# Patient Record
Sex: Male | Born: 1940 | Race: Black or African American | Hispanic: No | Marital: Married | Smoking: Former smoker
Health system: Southern US, Community
[De-identification: ages and names within clinical notes are randomized; demographics above are authoritative.]

## PROBLEM LIST (undated history)

## (undated) DIAGNOSIS — E7800 Pure hypercholesterolemia, unspecified: Secondary | ICD-10-CM

## (undated) DIAGNOSIS — I10 Essential (primary) hypertension: Secondary | ICD-10-CM

## (undated) DIAGNOSIS — Z9289 Personal history of other medical treatment: Secondary | ICD-10-CM

## (undated) DIAGNOSIS — Z923 Personal history of irradiation: Secondary | ICD-10-CM

## (undated) DIAGNOSIS — Z86718 Personal history of other venous thrombosis and embolism: Secondary | ICD-10-CM

## (undated) DIAGNOSIS — B182 Chronic viral hepatitis C: Secondary | ICD-10-CM

## (undated) DIAGNOSIS — E119 Type 2 diabetes mellitus without complications: Secondary | ICD-10-CM

## (undated) DIAGNOSIS — R112 Nausea with vomiting, unspecified: Secondary | ICD-10-CM

## (undated) DIAGNOSIS — I350 Nonrheumatic aortic (valve) stenosis: Secondary | ICD-10-CM

## (undated) DIAGNOSIS — Z9889 Other specified postprocedural states: Secondary | ICD-10-CM

## (undated) DIAGNOSIS — N453 Epididymo-orchitis: Secondary | ICD-10-CM

## (undated) DIAGNOSIS — R011 Cardiac murmur, unspecified: Secondary | ICD-10-CM

## (undated) DIAGNOSIS — N529 Male erectile dysfunction, unspecified: Secondary | ICD-10-CM

## (undated) DIAGNOSIS — M1990 Unspecified osteoarthritis, unspecified site: Secondary | ICD-10-CM

## (undated) HISTORY — DX: Epididymo-orchitis: N45.3

## (undated) HISTORY — DX: Male erectile dysfunction, unspecified: N52.9

## (undated) HISTORY — PX: SPINAL FUSION: SHX223

## (undated) HISTORY — DX: Essential (primary) hypertension: I10

## (undated) HISTORY — DX: Nonrheumatic aortic (valve) stenosis: I35.0

## (undated) HISTORY — DX: Personal history of other venous thrombosis and embolism: Z86.718

## (undated) HISTORY — PX: INGUINAL HERNIA REPAIR: SUR1180

## (undated) HISTORY — DX: Personal history of other medical treatment: Z92.89

## (undated) HISTORY — PX: TONSILLECTOMY: SUR1361

---

## 2000-04-24 ENCOUNTER — Encounter: Admission: RE | Admit: 2000-04-24 | Discharge: 2000-07-23 | Payer: Self-pay | Admitting: Emergency Medicine

## 2002-01-18 ENCOUNTER — Encounter: Payer: Self-pay | Admitting: Family Medicine

## 2002-01-18 ENCOUNTER — Encounter (INDEPENDENT_AMBULATORY_CARE_PROVIDER_SITE_OTHER): Payer: Self-pay

## 2002-01-18 ENCOUNTER — Ambulatory Visit (HOSPITAL_COMMUNITY): Admission: RE | Admit: 2002-01-18 | Discharge: 2002-01-18 | Payer: Self-pay | Admitting: Obstetrics & Gynecology

## 2002-11-04 ENCOUNTER — Encounter: Admission: RE | Admit: 2002-11-04 | Discharge: 2003-02-02 | Payer: Self-pay | Admitting: Emergency Medicine

## 2003-11-07 HISTORY — PX: TEE WITHOUT CARDIOVERSION: SHX5443

## 2003-12-02 ENCOUNTER — Inpatient Hospital Stay (HOSPITAL_COMMUNITY): Admission: EM | Admit: 2003-12-02 | Discharge: 2003-12-05 | Payer: Self-pay | Admitting: *Deleted

## 2003-12-03 ENCOUNTER — Encounter: Payer: Self-pay | Admitting: Cardiology

## 2004-10-15 ENCOUNTER — Inpatient Hospital Stay (HOSPITAL_COMMUNITY): Admission: EM | Admit: 2004-10-15 | Discharge: 2004-10-17 | Payer: Self-pay | Admitting: Internal Medicine

## 2004-12-02 ENCOUNTER — Encounter: Admission: RE | Admit: 2004-12-02 | Discharge: 2004-12-20 | Payer: Self-pay | Admitting: Neurosurgery

## 2005-05-30 ENCOUNTER — Ambulatory Visit (HOSPITAL_COMMUNITY): Admission: RE | Admit: 2005-05-30 | Discharge: 2005-05-30 | Payer: Self-pay | Admitting: Neurosurgery

## 2005-06-01 ENCOUNTER — Ambulatory Visit (HOSPITAL_COMMUNITY): Admission: RE | Admit: 2005-06-01 | Discharge: 2005-06-01 | Payer: Self-pay | Admitting: Neurosurgery

## 2005-06-09 HISTORY — PX: LAMINECTOMY: SHX219

## 2005-06-17 ENCOUNTER — Inpatient Hospital Stay (HOSPITAL_COMMUNITY): Admission: RE | Admit: 2005-06-17 | Discharge: 2005-06-21 | Payer: Self-pay | Admitting: Neurosurgery

## 2006-05-09 HISTORY — PX: BUNIONECTOMY: SHX129

## 2006-09-19 ENCOUNTER — Ambulatory Visit: Payer: Self-pay | Admitting: Gastroenterology

## 2006-12-14 ENCOUNTER — Ambulatory Visit: Payer: Self-pay | Admitting: Gastroenterology

## 2007-02-28 ENCOUNTER — Ambulatory Visit (HOSPITAL_COMMUNITY): Admission: RE | Admit: 2007-02-28 | Discharge: 2007-02-28 | Payer: Self-pay | Admitting: Gastroenterology

## 2007-02-28 ENCOUNTER — Encounter (INDEPENDENT_AMBULATORY_CARE_PROVIDER_SITE_OTHER): Payer: Self-pay | Admitting: Interventional Radiology

## 2007-03-15 ENCOUNTER — Ambulatory Visit: Payer: Self-pay | Admitting: Gastroenterology

## 2008-10-24 ENCOUNTER — Inpatient Hospital Stay (HOSPITAL_BASED_OUTPATIENT_CLINIC_OR_DEPARTMENT_OTHER): Admission: RE | Admit: 2008-10-24 | Discharge: 2008-10-24 | Payer: Self-pay | Admitting: Cardiology

## 2009-05-09 HISTORY — PX: KNEE ARTHROSCOPY: SUR90

## 2009-05-12 ENCOUNTER — Ambulatory Visit (HOSPITAL_BASED_OUTPATIENT_CLINIC_OR_DEPARTMENT_OTHER): Admission: RE | Admit: 2009-05-12 | Discharge: 2009-05-12 | Payer: Self-pay | Admitting: Orthopedic Surgery

## 2009-05-13 ENCOUNTER — Emergency Department (HOSPITAL_COMMUNITY): Admission: EM | Admit: 2009-05-13 | Discharge: 2009-05-13 | Payer: Self-pay | Admitting: Emergency Medicine

## 2009-12-09 ENCOUNTER — Encounter: Payer: Self-pay | Admitting: Gastroenterology

## 2009-12-16 ENCOUNTER — Encounter (INDEPENDENT_AMBULATORY_CARE_PROVIDER_SITE_OTHER): Payer: Self-pay | Admitting: *Deleted

## 2010-01-07 HISTORY — PX: COLONOSCOPY: SHX174

## 2010-01-15 ENCOUNTER — Encounter (INDEPENDENT_AMBULATORY_CARE_PROVIDER_SITE_OTHER): Payer: Self-pay | Admitting: *Deleted

## 2010-01-18 ENCOUNTER — Ambulatory Visit: Payer: Self-pay | Admitting: Gastroenterology

## 2010-01-18 ENCOUNTER — Encounter (INDEPENDENT_AMBULATORY_CARE_PROVIDER_SITE_OTHER): Payer: Self-pay | Admitting: *Deleted

## 2010-01-19 ENCOUNTER — Telehealth: Payer: Self-pay | Admitting: Gastroenterology

## 2010-02-03 ENCOUNTER — Ambulatory Visit: Payer: Self-pay | Admitting: Gastroenterology

## 2010-05-17 ENCOUNTER — Encounter
Admission: RE | Admit: 2010-05-17 | Discharge: 2010-05-17 | Payer: Self-pay | Source: Home / Self Care | Attending: Family Medicine | Admitting: Family Medicine

## 2010-05-27 ENCOUNTER — Ambulatory Visit
Admission: RE | Admit: 2010-05-27 | Discharge: 2010-05-27 | Payer: Self-pay | Source: Home / Self Care | Attending: Gastroenterology | Admitting: Gastroenterology

## 2010-05-27 DIAGNOSIS — B182 Chronic viral hepatitis C: Secondary | ICD-10-CM

## 2010-06-08 NOTE — Letter (Signed)
Summary: Colonoscopy Letter  [facility]  [address] [place], [place] [address]   Phone: [phone]  Fax: [phone]      December 09, 2009 MRN: [id number]   [name] [address] [place], [place]  [address]   Dear [name],   According to your medical record, it is time for you to schedule a Colonoscopy. The American Cancer Society recommends this procedure as a method to detect early colon cancer. Patients with a family history of colon cancer, or a personal history of colon polyps or inflammatory bowel disease are at increased risk.  This letter has been generated based on the recommendations made at the time of your procedure. If you feel that in your particular situation this may no longer apply, please contact our office.  Please call our office at [phone] to schedule this appointment or to update your records at your earliest convenience.  Thank you for cooperating with [place] to provide you with the very best care possible.   Sincerely,   Barbette Hair. Arlyce Dice, M.D.  [facility] Division [phone]

## 2010-06-08 NOTE — Letter (Signed)
Summary: Previsit letter  [facility]  [address] [place], [place] [address]   Phone: [phone]  Fax: [phone]       12/16/2009 MRN: [id number]  [name] [address] [place], [place]  [address]  Dear [name],  Welcome to the Gastroenterology Division at [facility].    You are scheduled to see a nurse for your pre-procedure visit on 01/18/2010 at 1:30PM on the 3rd floor at [facility], [address]  We ask that you try to arrive at our office 15 minutes prior to your appointment time to allow for check-in.  Your nurse visit will consist of discussing your medical and surgical history, your immediate family medical history, and your medications.    Please bring a complete list of all your medications or, if you prefer, bring the medication bottles and we will list them.  We will need to be aware of both prescribed and over the counter drugs.  We will need to know exact dosage information as well.  If you are on blood thinners (Coumadin, Plavix, Aggrenox, Ticlid, etc.) please call our office today/prior to your appointment, as we need to consult with your physician about holding your medication.   Please be prepared to read and sign documents such as consent forms, a financial agreement, and acknowledgement forms.  If necessary, and with your consent, a friend or relative is welcome to sit-in on the nurse visit with you.  Please bring your insurance card so that we may make a copy of it.  If your insurance requires a referral to see a specialist, please bring your referral form from your primary care physician.  No co-pay is required for this nurse visit.     If you cannot keep your appointment, please call [phone] to cancel or reschedule prior to your appointment date.  This allows [place] the opportunity to schedule an appointment for another patient in need of care.    Thank you for choosing [facility] for your medical needs.  We  appreciate the opportunity to care for you.  Please visit [place] at our website  to learn more about our practice.                     Sincerely.                                                                                                                   The Gastroenterology Division

## 2010-06-08 NOTE — Miscellaneous (Signed)
Summary: [facility] Previsit/prep  Clinical Lists Changes  Medications: Added new medication of MOVIPREP 100 GM  SOLR (PEG-KCL-NACL-NASULF-NA ASC-C) As per prep instructions. - Signed Rx of MOVIPREP 100 GM  SOLR (PEG-KCL-NACL-NASULF-NA ASC-C) As per prep instructions.;  #1 x 0;  Signed;  Entered by: Wyona Almas RN;  Authorized by: Mardella Layman MD [facility];  Method used: Electronically to [facility] [phone]*, [address] [place], [place]  [address], Ph: [phone], Fax: [phone] Observations: Added new observation of NKA: T (01/18/2010 13:13)    Prescriptions: MOVIPREP 100 GM  SOLR (PEG-KCL-NACL-NASULF-NA ASC-C) As per prep instructions.  #1 x 0   Entered by:   Wyona Almas RN   Authorized by:   Mardella Layman MD [facility]   Signed by:   Wyona Almas RN on 01/18/2010   Method used:   Electronically to        [facility] [address] [phone]* (retail)       [address]       [place], [place]  [address]       Ph: [phone]       Fax: [phone]   RxID:   [id number]

## 2010-06-08 NOTE — Letter (Signed)
Summary: [facility] Instructions  [facility]  [address] [place], [place] [address]   Phone: [phone]  Fax: [phone]       [name]    20-Jan-1941    MRN: [id number]        Procedure Day Dorna [place]: [facility]   02/03/2010     Arrival Time: 8:30AM     Procedure Time: 9:30AM     Location of Procedure:                    _X_   Endoscopy Center (4th Floor)                       PREPARATION FOR COLONOSCOPY WITH MOVIPREP   Starting 5 days prior to your procedure 01/29/2010 do not eat nuts, seeds, popcorn, corn, beans, peas,  salads, or any raw vegetables.  Do not take any fiber supplements (e.g. Metamucil, Citrucel, and Benefiber).  THE DAY BEFORE YOUR PROCEDURE         DATE: 02/02/10  DAY: TUESDAY  1.  Drink clear liquids the entire day-NO SOLID FOOD  2.  Do not drink anything colored red or purple.  Avoid juices with pulp.  No orange juice.  3.  Drink at least 64 oz. (8 glasses) of fluid/clear liquids during the day to prevent dehydration and help the prep work efficiently.  CLEAR LIQUIDS INCLUDE: Water Jello Ice Popsicles Tea (sugar ok, no milk/cream) Powdered fruit flavored drinks Coffee (sugar ok, no milk/cream) Gatorade Juice: apple, white grape, white cranberry  Lemonade Clear bullion, consomm, broth Carbonated beverages (any kind) Strained chicken noodle soup Hard Candy                             4.  In the morning, mix first dose of MoviPrep solution:    Empty 1 Pouch A and 1 Pouch B into the disposable container    Add lukewarm drinking water to the top line of the container. Mix to dissolve    Refrigerate (mixed solution should be used within 24 hrs)  5.  Begin drinking the prep at 5:00 p.m. The MoviPrep container is divided by 4 marks.   Every 15 minutes drink the solution down to the next mark (approximately 8 oz) until the full liter is complete.   6.  Follow completed prep with 16 oz of clear liquid of your choice  (Nothing red or purple).  Continue to drink clear liquids until bedtime.  7.  Before going to bed, mix second dose of MoviPrep solution:    Empty 1 Pouch A and 1 Pouch B into the disposable container    Add lukewarm drinking water to the top line of the container. Mix to dissolve    Refrigerate  THE DAY OF YOUR PROCEDURE      DATE: 02/03/10      DAY: WEDNESDAY  Beginning at 4:30AM (5 hours before procedure):         1. Every 15 minutes, drink the solution down to the next mark (approx 8 oz) until the full liter is complete.  2. Follow completed prep with 16 oz. of clear liquid of your choice.    3. You may drink clear liquids until 7:30AM (2 HOURS BEFORE PROCEDURE).   MEDICATION INSTRUCTIONS  Unless otherwise instructed, you should take regular prescription medications with a small sip of water   as early as possible the morning  of your procedure.  See Diabetic instructions:  Additional medication instructions: Hold Lisinopril HCT the morning of procedure.         OTHER INSTRUCTIONS  You will need a responsible adult at least 68 years of age to accompany you and drive you home.   This person must remain in the waiting room during your procedure.  Wear loose fitting clothing that is easily removed.  Leave jewelry and other valuables at home.  However, you may wish to bring a book to read or  an iPod/MP3 player to listen to music as you wait for your procedure to start.  Remove all body piercing jewelry and leave at home.  Total time from sign-in until discharge is approximately 2-3 hours.  You should go home directly after your procedure and rest.  You can resume normal activities the  day after your procedure.  The day of your procedure you should not:   Drive   Make legal decisions   Operate machinery   Drink alcohol   Return to work  You will receive specific instructions about eating, activities and medications before you leave.    The above  instructions have been reviewed and explained to me by   Wyona Almas RN  January 18, 2010 1:46 PM    I fully understand and can verbalize these instructions _____________________________ Date _________

## 2010-06-08 NOTE — Procedures (Signed)
Summary: Colonoscopy  Patient: [name] Note: All result statuses are Final unless otherwise noted.  Tests: (1) Colonoscopy (COL)   COL Colonoscopy           DONE     [facility]     [address]     [place], [place]  [address]           COLONOSCOPY PROCEDURE REPORT           PATIENT:  [name], [name]  MR#:  [id number]     BIRTHDATE:  Dec 24, 1940, 69 yrs. old  GENDER:  male           ENDOSCOPIST:  Barbette Hair. Arlyce Dice, MD     Referred by:           PROCEDURE DATE:  02/03/2010     PROCEDURE:  Diagnostic Colonoscopy     ASA CLASS:  Class II     INDICATIONS:  1) Routine Risk Screening           MEDICATIONS:   Fentanyl 75 mcg IV, Versed 6 mg IV, Benadryl 12.5     mg IV           DESCRIPTION OF PROCEDURE:   After the risks benefits and     alternatives of the procedure were thoroughly explained, informed     consent was obtained.  Digital rectal exam was performed and     revealed no abnormalities.   The LB CF-H180AL [id number] endoscope     was introduced through the anus and advanced to the cecum, which     was identified by both the appendix and ileocecal valve, without     limitations.  The quality of the prep was excellent, using     MiraLax.  The instrument was then slowly withdrawn as the colon     was fully examined.     <<PROCEDUREIMAGES>>           FINDINGS:  Internal hemorrhoids were found (see image19).  This     was otherwise a normal examination of the colon (see image2,     image3, image5, image7, image8, image9, image13, and image18).     Retroflexed views in the rectum revealed no abnormalities.    The     time to cecum =  6.50  minutes. The scope was then withdrawn (time     =  8.50  min) from the patient and the procedure completed.           COMPLICATIONS:  None           ENDOSCOPIC IMPRESSION:     1) Internal hemorrhoids     2) Otherwise normal examination     RECOMMENDATIONS:     1) Continue current colorectal screening recommendations for  "routine risk" patients with a repeat colonoscopy in 10 years.           REPEAT EXAM:  In 10 year(s) for Colonoscopy.           ______________________________     Barbette Hair. Arlyce Dice, MD           CC: Lajean Manes, MD           n.     Rosalie Doctor:   Barbette Hair. Kaplan at 02/03/2010 10:15 AM           Joelene Millin, [id number]  Note: An exclamation mark (!) indicates a result that was not dispersed into the flowsheet. Document Creation Date: 02/03/2010 10:15 AM _______________________________________________________________________  (  1) Order result status: Final Collection or observation date-time: 02/03/2010 10:10 Requested date-time:  Receipt date-time:  Reported date-time:  Referring Physician:   Ordering Physician: Melvia Heaps [phone]) Specimen Source:  Source: Launa Grill Order Number: [id number] Lab site:   Appended Document: Colonoscopy    Clinical Lists Changes  Observations: Added new observation of COLONNXTDUE: 01/2020 (02/03/2010 11:16)

## 2010-06-08 NOTE — Progress Notes (Signed)
Summary: Sample MoviPrep Kit  Phone Note Outgoing Call Call back at [facility] Phone [phone]   Call placed by: Merri Ray CMA Duncan Dull),  January 19, 2010 10:50 AM Summary of Call: Called pt to inform that his insurance will not pay for his MoviPrep kit. Left sample kit for pt out at the front desk Initial call taken by: Merri Ray CMA Duncan Dull),  January 19, 2010 10:51 AM

## 2010-06-08 NOTE — Letter (Signed)
Summary: Diabetic Instructions  [facility]  [address] [place], [place] [address]   Phone: [phone]  Fax: [phone]    [name] 03-11-1941 MRN: [id number]   _x  _   ORAL DIABETIC MEDICATION INSTRUCTIONS  The day before your procedure:   Take your diabetic pill as you do normally  The day of your procedure:   Do not take your diabetic pill    We will check your blood sugar levels during the admission process and again in Recovery before discharging you home  ________________________________________________________________________

## 2010-07-22 LAB — GLUCOSE, CAPILLARY
Glucose-Capillary: 145 mg/dL — ABNORMAL HIGH (ref 70–99)
Glucose-Capillary: 170 mg/dL — ABNORMAL HIGH (ref 70–99)

## 2010-07-25 LAB — POCT I-STAT 4, (NA,K, GLUC, HGB,HCT)
Glucose, Bld: 136 mg/dL — ABNORMAL HIGH (ref 70–99)
Hemoglobin: 16.7 g/dL (ref 13.0–17.0)
Potassium: 4.1 mEq/L (ref 3.5–5.1)
Sodium: 138 mEq/L (ref 135–145)

## 2010-08-16 LAB — POCT I-STAT 3, ART BLOOD GAS (G3+)
Bicarbonate: 21.8 mEq/L (ref 20.0–24.0)
pCO2 arterial: 39 mmHg (ref 35.0–45.0)

## 2010-08-16 LAB — POCT I-STAT 3, VENOUS BLOOD GAS (G3P V)
O2 Saturation: 69 %
TCO2: 21 mmol/L (ref 0–100)
pCO2, Ven: 45.7 mmHg (ref 45.0–50.0)

## 2010-08-19 ENCOUNTER — Ambulatory Visit (INDEPENDENT_AMBULATORY_CARE_PROVIDER_SITE_OTHER): Payer: Medicare Other | Admitting: Gastroenterology

## 2010-08-19 DIAGNOSIS — B182 Chronic viral hepatitis C: Secondary | ICD-10-CM

## 2010-09-10 ENCOUNTER — Other Ambulatory Visit: Payer: Self-pay | Admitting: Family Medicine

## 2010-09-21 NOTE — Cardiovascular Report (Signed)
[name], EDMONSTON NO.:  [id number]   MEDICAL RECORD NO.:  [id number]          PATIENT TYPE:  OIB   LOCATION:  1961                         FACILITY:  [facility]   PHYSICIAN:  Jake Bathe, MD      DATE OF BIRTH:  26-Sep-1940   DATE OF PROCEDURE:  10/24/2008  DATE OF DISCHARGE:  10/24/2008                            CARDIAC CATHETERIZATION   INDICATIONS:  Abnormal nuclear stress test.   PROCEDURES:  1. Left heart catheterization.  2. Selective coronary angiography.  3. Left ventriculogram.  4. Right heart catheterization with pressures and cardiac outputs      measured.   PROCEDURE DETAILS:  Informed consent was obtained.  Risk of stroke,  heart attack, death, renal impairment, and bleeding were explained to  the patient at length prior to procedure.  He was placed on the  catheterization table and prepped in a sterile fashion.  Lidocaine 1%  was used for local anesthesia.  Femoral head was visualized with  fluoroscopy.  Using the modified Seldinger technique, a 4-French sheath  was placed in the right femoral artery.  Using a similar technique, a 7-  French sheath was placed in the right femoral vein.  Right heart  catheterization was performed first with a balloon-tipped pressure  catheter.  All chambers entered.  Cardiac outputs obtained.  Oxygen  saturations obtained.  Pigtail catheter was then inserted into the left  ventricle.  Simultaneous pressures were obtained.  Following this, the  left ventriculogram was performed with 30 mL of contrast.  Careful  pullback was obtained measuring a possibility of hemodynamic gradient  changes across the left ventricle because hyperdynamic left ventricular  motion was noted.  Following removal of pigtail, Judkins left #4  catheter was used to selectively cannulate the left main artery and a no-  torque Williams right catheter was used to selectively cannulate the  right coronary artery.  Multiple views with hand  injection of Omnipaque  were obtained to these vessels.  Intracoronary 200 mcg of IC  nitroglycerin was administered in the right coronary artery with  moderate affect.   FINDINGS:  1. Left main artery - branches into the LAD and circumflex artery.      There is no angiographically significant coronary artery disease      present.  2. Left anterior descending artery - there are minor irregularities      throughout this vessel.  There are 2 diagonal branches noted with      also diffuse irregularities.  The second diagonal branch has      approximately 50% ostial stenosis.  However, this is a small-      caliber vessel.  The LAD then continues to wrap around the apex.  3. Circumflex artery.  This vessel gives rise to 2 obtuse marginal      branches.  There are minor irregularities throughout this vessel      also.  There is approximately a 30-40% ostial circumflex stenosis.      Appears non-flow limiting.  4. Right coronary artery.  This vessel is a dominant vessel giving  rise to the posterior descending artery.  There is a mid 50%      stenosis, which improved with nitroglycerin administration.      Otherwise, there were minor irregularities throughout this vessel      up to 20-30%.  5. Left ventriculogram.  Hyperdynamic left ventricular function with      apparent mid cavitary obliteration, ejection fraction is 75%.      There is no mitral regurgitation present.   Of note, the coronary arteries proximally due to have diffuse  calcification noted.   RIGHT HEART CATHETERIZATION:  1. Right atrial pressure 7/5 with a mean of 4.  2. Right ventricular pressure 32 with an end-diastolic pressure of 7.  3. Pulmonary artery pressure 31/9 with a mean of 20 mmHg.  4. Pulmonary capillary wedge pressure - 9/7 with a mean of 7 mmHg.  5. Left ventricular pressure is 142 with an end-diastolic pressure of      12 mmHg.  Aortic pressure was 142/66 with a mean of 101 mmHg.      Cardiac output  by Fick method was 4.2 L/min with a cardiac index of      2.2 by thermodilution method.  Cardiac output was 4 L/min with a      cardiac index of 2.1.  Aortic saturation was 96%.  Pulmonary artery      saturation was 69%.   IMPRESSION:  1. Minor irregularities/non-flow limiting coronary artery disease with      calcification of the proximal arterial tree.  2. Hyperdynamic left ventricular function with an ejection fraction of      75% in apparent mid cavitary obliteration without any significant      hemodynamic change with slow pullback across the left ventricle.  3. Normal right heart pressures with no evidence of pulmonary      hypertension.  4. No evidence of aortic stenosis by catheter pullback.   Echocardiogram demonstrated a gradient of mean 25 mmHg.  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  Once  again, there was not a gradient across the aortic valve itself.   PLAN:  Continue to modify his risk factors including diabetes and  hyperlipidemia.  Reassurance with no evidence of aortic stenosis.  Nuclear stress test showing apical anterior ischemia may be secondary to  hyperdynamic LV function.  No evidence of flow-limiting coronary artery  disease present.  We will see back in the clinic in 2 weeks.      Jake Bathe, MD  Electronically Signed     MCS/MEDQ  D:  10/24/2008  T:  10/24/2008  Job:  [id number]   cc:   Oley Balm. Georgina Pillion, M.D.

## 2010-09-24 NOTE — Consult Note (Signed)
NAMEVI, WHITESEL NO.:  [id number]   MEDICAL RECORD NO.:  [id number]                   PATIENT TYPE:  INP   LOCATION:  4710                                 FACILITY:  [facility]   PHYSICIAN:  Meade Maw, M.D.                 DATE OF BIRTH:  Sep 20, 1940   DATE OF CONSULTATION:  12/05/2003  DATE OF DISCHARGE:                                   CONSULTATION   INDICATIONS FOR CONSULT:  Bacteremia, evaluation for endocarditis.   Kamron is a 70 year old male who was admitted on July 24 with dental abscess  and recurrent fevers.  He was initially treated with penicillin at the time  of the root canal and following treatment, the patient became very weak and  fatigued with a two week history of fevers, chills, and weight loss.  On  initial exam, he was noted to have a systolic ejection murmur at the left  lower sternal border.  Transthoracic echo was subsequently performed and  this revealed mild aortic sclerosis, no vegetation, normal EF.   REVIEW OF SYMPTOMS:  He has had no difficulty with swallowing, no neck pain.   PAST MEDICAL HISTORY:  1. Significant for recent root canal.  2. Hernia repair.  3. Erectile dysfunction.  4. Hepatitis C treated at [facility].  5. Dyslipidemia.  6. Hypertension.  7. Adult onset diabetes.   SOCIAL HISTORY:  He has a remote history of tobacco use, no history of  alcohol.   CURRENT MEDICATIONS:  1. Interferon for hepatitis C.  2. CRESTOR 20 mg daily.  3. Norvasc 10 mg daily.  4. Lisinopril hydrochlorothiazide, 20/225.  5. Viagra p.r.n.  6. Aspirin 81 mg daily.   PHYSICAL EXAMINATION:  GENERAL:  Elderly male in no acute distress, looking older than his stated  age.  He is alert and oriented.  Orlene Erm is appropriate.  No focal  deficits noted.  VITAL SIGNS:  He is afebrile, blood pressure 105/59, heart rate 59.  HEENT:  Unremarkable.  PULMONARY:  Breath sounds are equal and clear to auscultation.  O2  saturation 100% on room air.  CARDIOVASCULAR:  Regular rate and rhythm.  There is a 1-2/6 systolic murmur  noted at the left lower sternal border.  EXTREMITIES:  No peripheral edema.   LABORATORY DATA:  Sodium 132, potassium 4.2.  Hematocrit 23.  He has had  negative serial cardiac enzymes.   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  Will proceed with transesophageal  echo for further evaluation for endocarditis.  Of note, the patient's blood  cultures have been negative.  Meade Maw, M.D.    HP/MEDQ  D:  12/05/2003  T:  12/05/2003  Job:  [id number]

## 2010-09-24 NOTE — Discharge Summary (Signed)
[name], COSBY NO.:  [id number]   MEDICAL RECORD NO.:  [id number]          PATIENT TYPE:  INP   LOCATION:  3031                         FACILITY:  [facility]   PHYSICIAN:  Hilda Lias, M.D.   DATE OF BIRTH:  11/13/1940   DATE OF ADMISSION:  06/17/2005  DATE OF DISCHARGE:  06/21/2005                                 DISCHARGE SUMMARY   ADMISSION DIAGNOSIS:  Degenerative disc disease with chronic radiculopathy  affecting the left 4-5 and S1 nerve root and in the right the 4-5.   FINAL DIAGNOSIS:  Degenerative disc disease with chronic radiculopathy  affecting the left 4, 5 and S1 nerve root and in the right the 4-5.   CLINICAL HISTORY:  The patient was admitted because of back pain with  radiation to both legs, left worse than right.  X-ray shows degenerative  disc disease at multiple levels, worse at the level of 3-4, 4-5, 5-1.  Surgery was advised.   LABORATORY DATA:  Normal.   HOSPITAL COURSE:  The patient was taken to surgery and decompression of the  left L3-4, 4-5, 5-1 space and on the right side between 4-5 was done.  The  patient complained of some discomfort with the lower back but now he is  doing much better.  He is ready to go home to be followed by [place] in our  office.   CONDITION ON DISCHARGE:  Improvement.   MEDICATIONS:  Baclofen and Percocet.   DIET:  Regular.   ACTIVITY:  Not to drive, not to do any lifting.   FOLLOW UP:  He will be seen by  me in my office in four weeks or before.           ______________________________  Hilda Lias, M.D.     EB/MEDQ  D:  06/21/2005  T:  06/21/2005  Job:  [id number]

## 2010-09-24 NOTE — H&P (Signed)
NAMETURHAN, CHILL NO.:  [id number]   MEDICAL RECORD NO.:  [id number]          PATIENT TYPE:  INP   LOCATION:  2899                         FACILITY:  [facility]   PHYSICIAN:  Hilda Lias, M.D.   DATE OF BIRTH:  06-Mar-1941   DATE OF ADMISSION:  06/17/2005  DATE OF DISCHARGE:  04/28/2005                                HISTORY & PHYSICAL   [name] is a gentleman who had been seen in my office for almost a year  complaining of back pain with radiation down to both legs, left worse than  the right one.  Patient gets worse when he stands and up to the point that  sometimes he has to shake the left leg.  Patient had conservative treatment  without any improvement.  He has an x-ray which showed degenerative disk  disease at the level of 4-5 and 5-1 and bony laminar 3-4.  The pain is  mostly in the left leg but also there is some pain in the right leg.  Because of findings patient wants to proceed with surgery.   PAST MEDICAL HISTORY:  He has surgery to the left foot and since then he has  some difficulty especially flexing and extending the foot.  He is not  allergic to medications.   SOCIAL HISTORY:  Negative.   FAMILY HISTORY:  There is a history of high blood pressure and diabetes in  his family.   PHYSICAL EXAMINATION:  GENERAL:  Patient came to my office and he was having  difficulty with the left leg.  He kept moving the leg to decrease the pain.  HEENT:  Normal.  NECK:  Normal.  LUNGS:  Clear.  HEART:  Sounds normal.  ABDOMEN:  Normal.  EXTREMITIES:  Normal pulses.  NEUROLOGIC:  Mental status normal.  Cranial nerves normal.  Strength shows  5/5 except with wiggling of the left foot which can be secondary to the old  injury and surgery.  Straight leg raising positive in the left side about  45, on the right side 60 degrees.   The x-rays show degenerative disk disease to the level 4-5, 5-1 bilaterally  and bony laminar 3-4.   CLINICAL IMPRESSION:   Chronic radiculopathy 4-5, 5-1 left worse than the  right.   RECOMMENDATIONS:  The patient is being admitted for surgery.  We are going  to proceed and do a bilateral 4-5 and 5-1 foraminotomy and probably 3-4 on  the left side.  He knows about the risks such as infection, CSF leak,  worsening pain, paralysis, need for further surgery which might require  fusion.           ______________________________  Hilda Lias, M.D.    EB/MEDQ  D:  06/17/2005  T:  06/17/2005  Job:  [id number]

## 2010-09-24 NOTE — H&P (Signed)
NAMESAMAN, UMSTEAD                            ACCOUNT NO.:  [id number]   MEDICAL RECORD NO.:  [id number]                   PATIENT TYPE:  INP   LOCATION:  4710                                 FACILITY:  [facility]   PHYSICIAN:  Melissa L. Ladona Ridgel, MD               DATE OF BIRTH:  09/17/1940   DATE OF ADMISSION:  12/02/2003  DATE OF DISCHARGE:                                HISTORY & PHYSICAL   ADDENDUM:  Please note the additional finding on physical exam of a right  renal bruit.  The significance of this is unknown.  We will work this up  during this hospitalization if appropriate and make recommendations for  followup.                                                Melissa L. Ladona Ridgel, MD    MLT/MEDQ  D:  12/02/2003  T:  12/02/2003  Job:  [id number]   cc:   Oley Balm. Georgina Pillion, M.D.  [address]  [place]  [place] [address]  Fax: [phone]

## 2010-09-24 NOTE — Op Note (Signed)
NAMEROMARI, GASPARRO NO.:  [id number]   MEDICAL RECORD NO.:  [id number]          PATIENT TYPE:  INP   LOCATION:  3031                         FACILITY:  [facility]   PHYSICIAN:  Hilda Lias, M.D.   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   PROCEDURE:  Left L5-L4 hemilaminectomy and foraminotomy to decompress the L3-  L4, L4-L5, and L5-S1 space on the right side, right L4-L5 and L5-S1  laminotomy and foraminotomy, microscope.   SURGEON:  Hilda Lias, M.D.   ASSISTANT:  Cristi Loron, M.D.   CLINICAL HISTORY:  The patient is admitted because of back pain with  radiation to both legs, left worse than the right one.  The patient had  failed conservative treatment.  The patient had a myelogram which showed  degenerative disc disease with adhesions at the level of L5-S1.  Surgery was  advised.  The approach was going to be bilaterally with more emphasis on the  left side.  He knows about the risks such as infection, CSF leak, worsening  of pain, paralysis, need for further surgery which might require fusion.   PROCEDURE:  The patient was taken to the OR and he was positioned in a prone  manner.  The back was prepped and drapes were applied.  A midline incision  from L3 to L5 was made.  On the left side, muscles were retracted from L3  down to L5-S1 and on the right side from L4 down to S1.  We brought the  microscope into the area.  We started first on the left side where we  proceeded with hemilaminectomy of L5 and L4.  The patient had a thick  calcified ligament with quite a bit of adhesions between the dura matter  and  yellow ligament.  We started at the level of L5-S1 and, indeed, we found  that the canal was narrow and foraminotomy was accomplished.  At the level  of 4-5, we found there was a bulging disc, mostly calcified, but indeed,  there was no soft component.  The foramen was quite narrow and using the 2,  3, and 4 mm Kerrison punch as well as the curved one with the drill, we were  able to decompress the L5 nerve root.  This same procedure was done at the  level of 3-4 with decompression.  All the discs were bulging and there was  no soft component.  There was a small pin hole opening in the dura which was  taken care of with a single stitch of Prolene.  On the right side, we went  ahead and did a laminotomy at L4-L5 and L5-S1.  The yellow ligament was also  excised.  Foraminotomy was accomplished.  Although the area was narrow, it  was now passable on the left side.  The area was irrigated.  Valsalva  maneuver was negative.  From then on, the muscles were closed with Vicryl  and Steri-Strips.  The patient did well.  The patient went to the PACU.          ______________________________  Hilda Lias, M.D.    EB/MEDQ  D:  06/17/2005  T:  06/17/2005  Job:  [id number]

## 2010-09-24 NOTE — H&P (Signed)
[name], [name]                            ACCOUNT NO.:  [id number]   MEDICAL RECORD NO.:  [id number]                   PATIENT TYPE:  INP   LOCATION:  1824                                 FACILITY:  [facility]   PHYSICIAN:  Melissa L. Ladona Ridgel, MD               DATE OF BIRTH:  09-08-1940   DATE OF ADMISSION:  12/02/2003  DATE OF DISCHARGE:                                HISTORY & PHYSICAL   CHIEF COMPLAINT:  Weakness, poor equilibrium, and dry mouth.   PRIMARY CARE PHYSICIAN:  Lindaann Slough, M.D.   HISTORY OF PRESENT ILLNESS:  The patient is a 70 year old, African American  male who had a crown placed at the end of the month which developed into an  abscess that was treated with penicillin and root canal by Dr. Stark Falls.  The patient states that after his crown he felt okay but then could not get  out of bed.  After the therapy with penicillin and drainage, he still has  been feeling weak and poor.  He has actually missed two weeks __________  work.  The patient, of note, has had fevers and chills without nausea and  vomiting, temperature up to 101.1.  Pertinent past medical history is for  hepatitis C of unknown duration being treated at [facility] [place], with Ribavirin and Interferon.  1. He has had significant decrease in appetite and weight loss and starting     his therapy.   REVIEW OF SYMPTOMS:  As per HPI.  The patient denies any diarrhea.  He  states he may be slightly constipated but has not noticed any dysuria,  melena, hematochezia, or any blood in his urine.  All other review of  systems are negative.   PAST MEDICAL HISTORY:  1. Erectile dysfunction, taking Viagra, his last dose was six to eight     months ago.  2. He has hepatitis C being treated at [facility].  As     stated a recent abscess was drained.  3. He has a left foot bunion for which he is to be seen by podiatrist.  4. Increased cholesterol __________ problem list.   PAST SURGICAL HISTORY:  1. He had recent root canal.  2. Hernia repair.   SOCIAL HISTORY:  He is an Midwife for [employer].  He denies tobacco  use.  He did smoke at one time, many years ago. He does not drink, nor has  he done any IV drugs.   FAMILY HISTORY:  His dad is living with diabetes and a stroke.  His mom is  deceased.  She had diabetes and a stroke.   ALLERGIES:  No known drug allergies, although he mentions a NARCOTIC  sensitivity.  He states that he feels out of his head when he takes them.   CURRENT MEDICATIONS:  1. Ribavirin and Interferon, doses of which are unknown.  2. Crestor 20 mg daily.  3. Norvasc 10 mg daily.  4. Lisinopril/hydrochlorothiazide 20/25 mg daily.  5. Viagra 50 mg p.r.n. His last dose was eight months ago.  6. Aspirin 81 mg.  7. His metformin has been discontinued secondary to the decreased weight and     decreased need for treatment of his diabetes.   EKG shows normal sinus rhythm but with sinus arrhythmia.  He has inverted T-  waves in V4 and 5, Q-waves in V5.   His white count is 5.3, hemoglobin 10.2, hematocrit 30.7; neutrophil shift  is 81%, platelets 220.  PT/INR is listed 28 and 1.1, respectively.  Blood  cultures are pending.  His CK panel is negative.  Sodium 130, potassium 4.4,  chloride 97, CO2 22, BUN 24, creatinine 1.4, glucose 88.   PHYSICAL EXAMINATION:  GENERAL APPEARANCE:  This is a cachetic African-  American male in moderate distress, presented with feeling fatigued and ill.  VITAL SIGNS:  Temperature in the emergency room is 99.5, blood pressure  99/52, orthostatics are pending, pulse 62, respiratory rate 12, 96% on room  air.  HEENT:  Normocephalic and atraumatic with temporal wasting.  His mucous  membranes are moderately moist.  He does have erythematous gums and his  teeth are intact.  NECK:  Supple.  There is no JVD, lymph nodes, no thyromegaly and no carotid  bruits.  CHEST:  Clear to auscultation with no  wheezing, rhonchi or rales.  CARDIOVASCULAR:  Regular rate and rhythm with positive S1 and S2, there is a  1 to 2/6 systolic ejection murmur best heard at the left sternal border.  ABDOMEN:  Soft, nontender and nondistended with positive hepatomegaly.  No  splenomegaly is palpable.  He does not guard and he has no rebound.  EXTREMITIES:  No clubbing, cyanosis, or edema.  He has multiple well-healed  sores on his lower extremities.  Definitely has clubbing and bunion  formation on bilateral feet.  Pulses are 2+.  Power is 5/5.  DTRs are 2+.  NEUROLOGIC:  He is nonfocal.   ASSESSMENT/PLAN:  This is a 70 year old, African-American male with fever,  chills, orthostasis, weight loss and poor appetite.  He likely has a  recurrence of an abscess in his left molar as confirmed by a Panorex in the  emergency room.   1. The patient will be admitted to telemetry for possible sepsis.  Will     start Zosyn and rehydrate.  We are checking blood culture and urine     cultures.  The Panorex is completed and does show a possible abscess.  I     discussed the case with Dr. Stark Falls who recommended oral surgery for     the patient.  I spoke with Dr. Warren Danes who is on call for oral surgery.     He recommends adding Cleocin and salt water swish and swallow and     possibly discharging the patient in the a.m.  He states at this time no     further drainage needs to be undertaken.  2. Cardiovascular:  Will hold his antihypertensive medications at this time,     rehydrate him.  Check a 2-D echo and consider a TEE if he is bacteremic     to rule out endocarditis.  3. Gastrointestinal:  Will hold his Ribavirin and Interferon.  I have left a     message with the [facility] at [facility] [place], their     phone  number is [phone], to try to obtain more information on his     medications, when to restart them or if to restart them.  Currently I am    no placing the patient on Protonix as  he has no GI symptoms.  4. Genitourinary:  We are checking a urinalysis culture and sensitivity.  5. Endocrine:  Will check a TSH and hemoglobin A1c and hold all oral agents.     We will provide a sliding scale insulin as I am going to place the     patient on a regular diet in order to provide adequate calorie intake as     well as dietary supplements.  Will request a nutrition consult.  6. Dental abscess:  I have discussed with Dr. Laural Benes and Dr. Warren Danes.  At     this time, I will add IV antibiotics     and the salt water swish and spits as per Dr. Warren Danes, and will add     Cleocin 300 mg t.i.d. At this time, will him on q.6h. IV until discharge     at which time we can convert him over to t.i.d. dosing.  7. Also put the patient on PAS hose for deep venous thrombosis prophylaxis.                                                Melissa L. Ladona Ridgel, MD    MLT/MEDQ  D:  12/02/2003  T:  12/02/2003  Job:  [id number]   cc:   Lindaann Slough, M.D.  [address]  [place]  [place] [address]  Fax: [phone]   Franky Macho M.D. [employer]

## 2010-09-24 NOTE — Discharge Summary (Signed)
NAMEMEER, REINDL NO.:  [id number]   MEDICAL RECORD NO.:  [id number]                   PATIENT TYPE:  INP   LOCATION:  4710                                 FACILITY:  [facility]   PHYSICIAN:  Sherin Quarry, MD                   DATE OF BIRTH:  07-Apr-1941   DATE OF ADMISSION:  12/02/2003  DATE OF DISCHARGE:  12/05/2003                                 DISCHARGE SUMMARY   [name] is a 70 year old gentleman who initially presented on July 25.  At that time, he reported that he had a dental abscess develop approximately  three weeks prior to presentation.  This was treated by drainage, a root  canal procedure, and penicillin.  Since completing the penicillin therapy  and drainage, the swelling and pain had improved dramatically, but the  patient continued to feel weak and tired.  He had persistent fevers and  chills with temperature up 101 degrees.  He denied any associated headache,  breathing difficulty, or chest pain.  He had no vomiting, no diarrhea, no  dysuria or hematuria.  No urinary frequently.  Of note is that the patient  has chronic hepatitis C and is receiving treatment at [facility].   ADMISSION PHYSICAL EXAMINATION:  GENERAL:  Thin gentleman who was quite  fatigued and complaining of fever.  VITAL SIGNS:  Temperature 99.5, blood pressure 99/52, pulse 62, respirations  12.  HEENT:  Within normal limits.  CHEST: Clear to auscultation and percussion.  CARDIOVASCULAR:  Grade 2/6 systolic murmur best heard at the left sternal  border.  ABDOMEN:  Benign with equal bowel sounds without masses or tenderness.  There was no guarding or rebound.  NEUROLOGIC:  Within normal limits.  EXTREMITIES:  Examination revealed no cyanosis or edema.  DP and PT pulses  were 2+.   LABORATORY AND X-RAY DATA:  Relevant studies obtained on admission included  CBC which revealed a white count of 5300, hemoglobin 10.2, MCV within normal  limits. The differential was notable for 81% Segs.  Platelet count was  220,000. Sedimentation rate was 132.  CMET revealed a sodium of 130,  potassium 4.4, glucose 88, creatinine 1.4, BUN 24.  AST was 78, ALT 63,  possibly consistent with known history of chronic hepatitis.  Thyroid  profile was normal, and CPK was normal.  Serial blood cultures were obtained  which are negative to date.  Urinalysis revealed evidence of bacteruria.  A  urine culture was obtained and was positive for E. coli greater than 100,000  colonies per milliliter.  This organism was resistant to ampicillin and  tefazoline as well as trimethoprim sulfate.   Subsequently, Mr. Geno had a chest x-ray which was normal.   Renal ultrasound showed two small cysts but otherwise normal.   An Orthopantogram was done.  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   Echocardiogram was performed.  This showed normal left ventricular systolic  function with ejection fraction of 55 to 65%.  The patient was noted to have  mild aortic valve stenosis.  The median transaortic valve gradient was felt  to be about 9 mmHg.  The right atrium was described as dilated.   Because of the findings of fever, markedly elevated sedimentation rate, past  history of dental abscess and heart murmur, a transesophageal echocardiogram  was done, too.  This showed no signs of vegetation.  There was normal left  ventricular function, normal ejection fraction, and Dr. Fraser Din felt the  only finding on this study was that the patient should receive SBE  prophylaxis in the future because of the aortic murmur.   Consultation was obtained with Dr. Maurice March of the infectious disease service.  Dr. Maurice March indicated that, after reviewing the patient's records, although he  shared the feeling that it was appropriate to do workup to rule out SBE,  given the negative blood cultures and negative TEE, it was reasonable to  assume that this was  not the case.  He recommended the patient receive  treatment for E. coli urinary tract infection as an outpatient.   During the course of his hospitalization, hemoglobin drifted down to 8 with  hematocrit of 23.  Subsequently, ferritin, B12, and folate were found to be  normal.  The patient had at least two stools sent for occult blood which  were negative.  He was administered 2 units of packed cells.   On July 29, I felt it was reasonable to discharge the patient as he was  afebrile and feeling much better.   DISCHARGE DIAGNOSES:  1. Prolonged fever, possibly secondary to urinary tract infection.  2. Anemia of uncertain etiology.  3. History of hypertension.  4. Chronic hepatitis C, currently under treatment.   PLAN:  1. The patient will continue rhabdovirus interferon therapy.  2. Crestor 20 mg daily.  3. Norvasc 10 mg daily.  4. Lisinopril/HCTZ 20/25 daily.  5. Aspirin 81 mg daily.  6. He was advised to take Cipro at a dose of 500 mg b.i.d. for 10 additional     days.   FOLLOW UP:  In regard to followup, I made the following recommendations.  1. After completion of Cipro, he should have a followup urinalysis to     confirm that his infection has resolved.  2. I explained to him my concern about his anemia.  Prior to discharge, his     serum protein and urine protein electrophoresis were obtained in addition     to the other studies.  I suggested that a reasonable plan would be to     follow up on his blood count in about 10 days to 2 weeks.  At that time     if there is indication that the hemoglobin has dropped from its current     level of 10.6, it might be prudent to plan further evaluation of anemia,     possibly by obtaining consultation from hematology.  It is certainly     possible that his chronic interferon therapy is contributing to the     anemia.   CONDITION ON DISCHARGE:  Good.  Sherin Quarry, MD    SY/MEDQ   D:  12/05/2003  T:  12/05/2003  Job:  [id number]   cc:   Oley Balm. Georgina Pillion, M.D.  [address]  [place]  [place] [address]  Fax: [phone]   Francisca December, M.D.  [address]  [address]  [place]  [place] [address]  Fax: [phone]   Fransisco Hertz, M.D.  [address]  [place] [address]  Fax: [phone]

## 2010-09-24 NOTE — H&P (Signed)
[name], [name]                ACCOUNT NO.:  [id number]   MEDICAL RECORD NO.:  [id number]          PATIENT TYPE:  INP   LOCATION:  1609                         [facility]ACILITY[facility]   PHYSICIAN:  Corinna L. Lendell Caprice, MDDATE OF BIRTH:  March 15, 1941   DATE OF ADMISSION:  10/15/2004  DATE OF DISCHARGE:                                HISTORY & PHYSICAL   CHIEF COMPLAINT:  Weakness.   HISTORY OF PRESENT ILLNESS:  [name] is a 70 year old black male with a  history of type 2 diabetes who was seen in Dr. Rosanne Ashing office today and  directly admitted to the hospital for hyperglycemia and dehydration. The  patient has a history of type 2 diabetes but has been off his medications  apparently per the instructions of one of his physicians for several months.  He reports that he had well-controlled diabetes previously. However, he was  recently treated with a 2-week course of steroid for a lumbar radiculopathy.  Since then, he began having polyuria, polydipsia, and has lost 18 pounds  since April according to Dr. Rosanne Ashing office records. He also has blurry  vision, his appetite has been poor. He denies any dysuria, cough, or  shortness of breath, no chest pain. He reports that he was checking his  sugars at home and they were running about 180 2 days ago and then began  going sky-high.   PAST MEDICAL HISTORY:  1.  Type 2 diabetes, most recently diet controlled but the patient had been      on Glucophage in the past.  2.  History of hepatitis C, treated last year.  3.  Hyperlipidemia.  4.  Hypertension.  5.  Degenerative disc disease with radiculopathy, improved.   MEDICATIONS:  1.  Norvasc 10 mg a day.  2.  Lisinopril/hydrochlorothiazide 20/25 mg a day.  3.  Vytorin 10/20 mg p.o. q.h.s.  4.  Aspirin 81 mg p.o. daily.   ALLERGIES:  MORPHINE causes delirium.   SOCIAL HISTORY:  The patient smokes cigarettes occasionally. He does not  drink. He works for the city.   REVIEW OF SYSTEMS:  As  above; otherwise negative. Also, his hemoglobin A1c  was 7.2 in April according to Dr. Georgina Pillion.   PHYSICAL EXAMINATION:  VITAL SIGNS:  Temperature is 97.8, heart rate 70,  blood pressure 127/77, respiratory rate 16.  GENERAL:  The patient is a thin black male who appears weak.  HEENT:  Normocephalic, atraumatic. Pupils equal, round, and reactive to  light. Arcus senilis is present. Conjunctivae are pink, sclerae nonicteric.  He has slightly dry mucous membranes.  NECK:  Supple, no lymphadenopathy, no thyromegaly.  LUNGS:  Clear to auscultation bilaterally without wheezes, rhonchi, or  rales.  CARDIOVASCULAR:  Regular rate and rhythm without murmurs, gallops, rubs.  ABDOMEN:  Normal bowel sounds, soft, nontender, nondistended.  GENITOURINARY AND RECTAL:  Deferred.  EXTREMITIES:  No clubbing, cyanosis, or edema. No ulcerations of the feet.  SKIN:  No rash.  PSYCHIATRIC:  Normal affect.  NEUROLOGIC:  Alert and oriented. Cranial nerves and sensory motor exam are  intact.   LABORATORY DATA:  Sodium 126, potassium 4.8, chloride 86, bicarbonate 32,  glucose 810, BUN 34, creatinine 1.4, alkaline phosphatase 158, total  bilirubin 1.1, SGOT 36, SGPT 77, albumin 3.4, calcium 9.6, total protein  7.7. CBC is normal.   ASSESSMENT AND PLAN:  1.  Uncontrolled diabetes without any acidosis, secondary to recent steroid      use. The patient will get vigorous IV hydration and an insulin drip. I      will also resume his Glucophage as even when his sugars were supposedly      under control his hemoglobin A1c was still above 7.  2.  Dehydration.  3.  Prerenal azotemia.  4.  Pseudohyponatremia.  5.  History of hepatitis C.  6.  Hypertension.  7.  Hyperlipidemia.   I will continue his outpatient medications with the exception of  hydrochlorothiazide. I will also check a hemoglobin A1c. At this point he  does not appear to have infections or other reasons to have hyperglycemia  and I will not order  any infectious workup.       CLS/MEDQ  D:  10/15/2004  T:  10/15/2004  Job:  [id number]   cc:   Oley Balm. Georgina Pillion, M.D.  [address]  [place]  [place] [address]  Fax: [phone]

## 2010-09-24 NOTE — Op Note (Signed)
NAMEMARTAVIS, Hoffman NO.:  [id number]   MEDICAL RECORD NO.:  [id number]                   PATIENT TYPE:  INP   LOCATION:  4710                                 FACILITY:  [facility]   PHYSICIAN:  Meade Maw, M.D.                 DATE OF BIRTH:  10-30-1940   DATE OF PROCEDURE:  12/05/2003  DATE OF DISCHARGE:                                 OPERATIVE REPORT   PROCEDURE:  Transthoracic echocardiogram.   INDICATIONS FOR PROCEDURE:  Ongoing fevers and chills.   DESCRIPTION OF PROCEDURE:  After presenting written informed consent, the  patient was brought to the endoscopy lab in the post absorptive state.  Preop sedation was achieved using IV Versed.  Fentanyl was not given  secondary to questionable narcotic allergy.  Following appropriate sedation,  the Omniplane probe was introduced using digital guidance without  difficulty.  Multiple views were obtained to the mid esophageal, basal, and  deep gastric view.  Color flow Doppler was performed across the mitral,  tricuspid, aortic, and pulmonic valves.  The ascending and descending aorta  was well visualized.   FINDINGS:  There was mild aortic sclerosis, minimal restriction, and  systolic excursion, trivial aortic insufficiency, there was no vegetation  noted on the aortic valve.  Mitral valve revealed minimal thickening of the  anterior and posterior leaflet, there was normal mitral valve leaflet  excursion, there was no vegetation noted on the mitral valve.  Tricuspid  valve was grossly normal. Pulmonic valve grossly normal.  Normal LV dimension.  Normal systolic function.  No regional wall motion  abnormalities noted.  Left atrium upper limits of normal.  Right atrium  upper limits of normal.  Right ventricle revealed normal systolic function.  Ascending aorta, aortic arch without evidence of atherosclerosis.   FINAL IMPRESSION:  1. Mild aortic sclerosis but no significant vegetation noted.  2. Normal  left ventricular dimension, normal systolic function.  3. Normal mitral valve morphology.  4. Normal tricuspid and pulmonic valve morphology.                                               Meade Maw, M.D.    HP/MEDQ  D:  12/05/2003  T:  12/05/2003  Job:  [id number]   cc:   Lindaann Slough, M.D.  [address]  [place]  [place] [address]  Fax: [phone]

## 2010-10-04 ENCOUNTER — Inpatient Hospital Stay (INDEPENDENT_AMBULATORY_CARE_PROVIDER_SITE_OTHER)
Admission: RE | Admit: 2010-10-04 | Discharge: 2010-10-04 | Disposition: A | Discharge status: Home / Self Care | Payer: Medicare Other | Source: Ambulatory Visit | Attending: Emergency Medicine | Admitting: Emergency Medicine

## 2010-10-04 DIAGNOSIS — S61209A Unspecified open wound of unspecified finger without damage to nail, initial encounter: Secondary | ICD-10-CM

## 2011-02-16 LAB — PROTIME-INR
INR: 1.1
Prothrombin Time: 14.1

## 2011-02-16 LAB — CBC
MCHC: 34.1
MCV: 93.1

## 2011-05-23 DIAGNOSIS — D219 Benign neoplasm of connective and other soft tissue, unspecified: Secondary | ICD-10-CM | POA: Diagnosis not present

## 2011-05-31 DIAGNOSIS — E1139 Type 2 diabetes mellitus with other diabetic ophthalmic complication: Secondary | ICD-10-CM | POA: Diagnosis not present

## 2011-06-04 DIAGNOSIS — D219 Benign neoplasm of connective and other soft tissue, unspecified: Secondary | ICD-10-CM | POA: Diagnosis not present

## 2011-07-12 DIAGNOSIS — Z0000 Encounter for general adult medical examination without abnormal findings: Secondary | ICD-10-CM | POA: Diagnosis not present

## 2011-07-12 DIAGNOSIS — I10 Essential (primary) hypertension: Secondary | ICD-10-CM | POA: Diagnosis not present

## 2011-07-12 DIAGNOSIS — M25519 Pain in unspecified shoulder: Secondary | ICD-10-CM | POA: Diagnosis not present

## 2011-07-12 DIAGNOSIS — Z23 Encounter for immunization: Secondary | ICD-10-CM | POA: Diagnosis not present

## 2011-07-12 DIAGNOSIS — K4090 Unilateral inguinal hernia, without obstruction or gangrene, not specified as recurrent: Secondary | ICD-10-CM | POA: Diagnosis not present

## 2011-07-12 DIAGNOSIS — E785 Hyperlipidemia, unspecified: Secondary | ICD-10-CM | POA: Diagnosis not present

## 2011-07-12 DIAGNOSIS — Z125 Encounter for screening for malignant neoplasm of prostate: Secondary | ICD-10-CM | POA: Diagnosis not present

## 2011-07-12 DIAGNOSIS — E119 Type 2 diabetes mellitus without complications: Secondary | ICD-10-CM | POA: Diagnosis not present

## 2011-07-25 ENCOUNTER — Emergency Department (HOSPITAL_COMMUNITY): Payer: Medicare Other

## 2011-07-25 ENCOUNTER — Emergency Department (HOSPITAL_COMMUNITY)
Admission: EM | Admit: 2011-07-25 | Discharge: 2011-07-25 | Disposition: A | Discharge status: Home / Self Care | Payer: Medicare Other | Attending: Emergency Medicine | Admitting: Emergency Medicine

## 2011-07-25 DIAGNOSIS — N5089 Other specified disorders of the male genital organs: Secondary | ICD-10-CM | POA: Insufficient documentation

## 2011-07-25 DIAGNOSIS — N509 Disorder of male genital organs, unspecified: Secondary | ICD-10-CM | POA: Diagnosis not present

## 2011-07-25 DIAGNOSIS — N453 Epididymo-orchitis: Secondary | ICD-10-CM | POA: Diagnosis not present

## 2011-07-25 DIAGNOSIS — R61 Generalized hyperhidrosis: Secondary | ICD-10-CM | POA: Diagnosis not present

## 2011-07-25 DIAGNOSIS — R109 Unspecified abdominal pain: Secondary | ICD-10-CM | POA: Diagnosis not present

## 2011-07-25 DIAGNOSIS — M25519 Pain in unspecified shoulder: Secondary | ICD-10-CM | POA: Diagnosis not present

## 2011-07-25 DIAGNOSIS — M7989 Other specified soft tissue disorders: Secondary | ICD-10-CM | POA: Insufficient documentation

## 2011-07-25 DIAGNOSIS — N508 Other specified disorders of male genital organs: Secondary | ICD-10-CM | POA: Diagnosis not present

## 2011-07-25 DIAGNOSIS — N451 Epididymitis: Secondary | ICD-10-CM

## 2011-07-25 DIAGNOSIS — N433 Hydrocele, unspecified: Secondary | ICD-10-CM | POA: Diagnosis not present

## 2011-07-25 MED ORDER — SULFAMETHOXAZOLE-TRIMETHOPRIM 800-160 MG PO TABS: 1.0000 | ORAL_TABLET | Freq: Two times a day (BID) | ORAL | Status: AC

## 2011-07-25 MED ORDER — OXYCODONE-ACETAMINOPHEN 5-325 MG PO TABS: 2.0000 | ORAL_TABLET | ORAL | Status: AC | PRN

## 2011-07-25 MED ORDER — SULFAMETHOXAZOLE-TRIMETHOPRIM 800-160 MG PO TABS: 1.0000 | ORAL_TABLET | Freq: Two times a day (BID) | ORAL | Status: DC

## 2011-07-25 MED ORDER — OXYCODONE-ACETAMINOPHEN 5-325 MG PO TABS
1.0000 | ORAL_TABLET | Freq: Once | ORAL | Status: AC
  Administered 2011-07-25: 1 via ORAL
  Filled 2011-07-25: qty 1

## 2011-07-25 NOTE — ED Provider Notes (Signed)
History     CSN: [id number]  Arrival date & time 07/25/11  1043   First MD Initiated Contact with Patient 07/25/11 1330      Chief Complaint  Patient presents with  . Groin Pain    (Consider location/radiation/quality/duration/timing/severity/associated sxs/prior treatment) Patient is a 71 y.o. male presenting with groin pain.  Groin Pain Pertinent negatives include no abdominal pain and no headaches.   71 year old male, with a history of hypertension, hyperlipidemia, and diabetes.  Presents emergency department complaining of shoulder pain and swelling.  He states that his left testicle is alert and in the right testicle hasn't been present for a few days.  He denies nausea, vomiting.  He denies dysuria or hematuria.  He denies penile discharge.  He denies involuntary weight loss.  His wife states that he has been having sweating at night time.  No past medical history on file.  No past surgical history on file.  No family history on file.  History  Substance Use Topics  . Smoking status: Not on file  . Smokeless tobacco: Not on file  . Alcohol Use: Not on file      Review of Systems  Constitutional: Positive for diaphoresis. Negative for fever, chills and unexpected weight change.       His wife reports night sweats  Gastrointestinal: Negative for nausea, vomiting and abdominal pain.  Genitourinary: Positive for testicular pain. Negative for dysuria, hematuria, discharge, penile swelling, genital sores and penile pain.  Skin: Negative for rash.  Neurological: Negative for headaches.  Hematological: Does not bruise/bleed easily.  All other systems reviewed and are negative.    Allergies  Review of patient's allergies indicates no known allergies.  Home Medications   Current Outpatient Rx  Name Route Sig Dispense Refill  . AMLODIPINE BESYLATE 10 MG PO TABS Oral Take 10 mg by mouth daily.    . ASPIRIN EC 81 MG PO TBEC Oral Take 81 mg by mouth at bedtime.    .  ATORVASTATIN CALCIUM 20 MG PO TABS Oral Take 20 mg by mouth at bedtime.    . IBUPROFEN 200 MG PO TABS Oral Take 200 mg by mouth every 6 (six) hours as needed. For pain    . LISINOPRIL-HYDROCHLOROTHIAZIDE 20-12.5 MG PO TABS Oral Take 2 tablets by mouth daily.    Marland Kitchen METFORMIN HCL 500 MG PO TABS Oral Take 1,000-1,500 mg by mouth 2 (two) times daily with a meal. Take 2 tabs in the morning and 3 tabs in the evening    . METOPROLOL SUCCINATE ER 25 MG PO TB24 Oral Take 25 mg by mouth daily.      BP 129/76  Pulse 88  Temp 98.3 F (36.8 C)  Resp 20  SpO2 100%  Physical Exam  Vitals reviewed. Constitutional: He is oriented to person, place, and time. He appears well-developed and well-nourished.  HENT:  Head: Normocephalic and atraumatic.  Eyes: Conjunctivae are normal. Pupils are equal, round, and reactive to light.  Neck: Normal range of motion. Neck supple.  Pulmonary/Chest: Effort normal.  Abdominal: Soft. Bowel sounds are normal. He exhibits no distension. There is no tenderness.  Genitourinary: Penis normal. No penile tenderness.       Left testicle is tender in 3 times.  The size of the right chest No lesions on the penis.  No penile discharge  Musculoskeletal: Normal range of motion.  Neurological: He is alert and oriented to person, place, and time.  Skin: Skin is warm and dry. No rash  noted. No erythema.  Psychiatric: He has a normal mood and affect. Thought content normal.    ED Course  Procedures (including critical care time) 71 year old male, presents with left testicular pain and swelling for several days.  On examination.  His left testicle is tender in 3 times.  The size of his right testicle.  We will given him an analgesic and perform a chest oral percent for further evaluation.  Labs Reviewed - No data to display No results found.   No diagnosis found.    MDM  Left testicular pain and swelling Epididymitis- orchitis No signs of systemic illness.  Pain  controlled in the emergency department        Cheri Guppy, MD 07/25/11 1540

## 2011-07-25 NOTE — Discharge Instructions (Signed)
Your ultrasound shows signs of infection in your testicle, and the epididymis.  Use ibuprofen 600 mg every 6 hours for pain.  Use Percocet for more severe pain.  Use Bactrim for infection.  Followup with your Dr. for reevaluation.  If your symptoms.  Last more than a week after you have started the antibiotic.  Return for worse or uncontrolled symptoms  Epididymitis Epididymitis is a swelling (inflammation) of the epididymis. The epididymis is a cord-like structure along the back part of the testicle. Epididymitis is usually, but not always, caused by infection. This is usually a sudden problem beginning with chills, fever and pain behind the scrotum and in the testicle. There may be swelling and redness of the testicle. DIAGNOSIS  Physical examination will reveal a tender, swollen epididymis. Sometimes, cultures are obtained from the urine or from prostate secretions to help find out if there is an infection or if the cause is a different problem. Sometimes, blood work is performed to see if your white blood cell count is elevated and if a germ (bacterial) or viral infection is present. Using this knowledge, an appropriate medicine which kills germs (antibiotic) can be chosen by your caregiver. A viral infection causing epididymitis will most often go away (resolve) without treatment. HOME CARE INSTRUCTIONS   Hot sitz baths for 20 minutes, 4 times per day, may help relieve pain.   Only take over-the-counter or prescription medicines for pain, discomfort or fever as directed by your caregiver.   Take all medicines, including antibiotics, as directed. Take the antibiotics for the full prescribed length of time even if you are feeling better.   It is very important to keep all follow-up appointments.  SEEK IMMEDIATE MEDICAL CARE IF:   You have a fever.   You have pain not relieved with medicines.   You have any worsening of your problems.   Your pain seems to come and go.   You develop pain,  redness, and swelling in the scrotum and surrounding areas.  MAKE SURE YOU:   Understand these instructions.   Will watch your condition.   Will get help right away if you are not doing well or get worse.  Document Released: 04/22/2000 Document Revised: 04/14/2011 Document Reviewed: 03/12/2009 [facility] Patient Information 2012 [place], [place].

## 2011-07-25 NOTE — ED Notes (Signed)
Onset 2 weeks ago seen Primary Doctor normal examine however pain continued seen Doctor today sent to ED for evaluation.  Patient states pain left groin and now left testicle swollen pain 8/10 achy dull.  Denies urinary complaints.

## 2011-07-25 NOTE — ED Notes (Signed)
Patient resting comfortably on stretcher denies pain at this time. Family member at bedside,

## 2011-07-25 NOTE — ED Notes (Signed)
Pt complains of groin pain sts it is bad, started Friday, sts testicle on elft large thar right

## 2011-08-03 DIAGNOSIS — I10 Essential (primary) hypertension: Secondary | ICD-10-CM | POA: Diagnosis not present

## 2011-08-03 DIAGNOSIS — N453 Epididymo-orchitis: Secondary | ICD-10-CM | POA: Diagnosis not present

## 2011-08-24 DIAGNOSIS — I359 Nonrheumatic aortic valve disorder, unspecified: Secondary | ICD-10-CM | POA: Diagnosis not present

## 2011-08-24 DIAGNOSIS — E785 Hyperlipidemia, unspecified: Secondary | ICD-10-CM | POA: Diagnosis not present

## 2011-08-24 DIAGNOSIS — I10 Essential (primary) hypertension: Secondary | ICD-10-CM | POA: Diagnosis not present

## 2011-08-24 DIAGNOSIS — E119 Type 2 diabetes mellitus without complications: Secondary | ICD-10-CM | POA: Diagnosis not present

## 2011-09-13 DIAGNOSIS — R040 Epistaxis: Secondary | ICD-10-CM | POA: Diagnosis not present

## 2011-09-13 DIAGNOSIS — I10 Essential (primary) hypertension: Secondary | ICD-10-CM | POA: Diagnosis not present

## 2011-09-13 DIAGNOSIS — I359 Nonrheumatic aortic valve disorder, unspecified: Secondary | ICD-10-CM | POA: Diagnosis not present

## 2011-10-11 DIAGNOSIS — N453 Epididymo-orchitis: Secondary | ICD-10-CM | POA: Diagnosis not present

## 2012-02-29 DIAGNOSIS — Z23 Encounter for immunization: Secondary | ICD-10-CM | POA: Diagnosis not present

## 2012-03-15 DIAGNOSIS — E119 Type 2 diabetes mellitus without complications: Secondary | ICD-10-CM | POA: Diagnosis not present

## 2012-03-15 DIAGNOSIS — Z136 Encounter for screening for cardiovascular disorders: Secondary | ICD-10-CM | POA: Diagnosis not present

## 2012-03-15 DIAGNOSIS — I10 Essential (primary) hypertension: Secondary | ICD-10-CM | POA: Diagnosis not present

## 2012-03-15 DIAGNOSIS — M25519 Pain in unspecified shoulder: Secondary | ICD-10-CM | POA: Diagnosis not present

## 2012-03-15 DIAGNOSIS — E785 Hyperlipidemia, unspecified: Secondary | ICD-10-CM | POA: Diagnosis not present

## 2012-03-15 DIAGNOSIS — N529 Male erectile dysfunction, unspecified: Secondary | ICD-10-CM | POA: Diagnosis not present

## 2012-03-15 DIAGNOSIS — B1920 Unspecified viral hepatitis C without hepatic coma: Secondary | ICD-10-CM | POA: Diagnosis not present

## 2012-03-19 ENCOUNTER — Other Ambulatory Visit: Payer: Self-pay | Admitting: Family Medicine

## 2012-03-19 DIAGNOSIS — B1920 Unspecified viral hepatitis C without hepatic coma: Secondary | ICD-10-CM

## 2012-03-22 ENCOUNTER — Other Ambulatory Visit: Payer: Medicare Other

## 2012-03-28 ENCOUNTER — Ambulatory Visit
Admission: RE | Admit: 2012-03-28 | Discharge: 2012-03-28 | Disposition: A | Discharge status: Home / Self Care | Payer: Medicare Other | Source: Ambulatory Visit | Attending: Family Medicine | Admitting: Family Medicine

## 2012-03-28 DIAGNOSIS — B1920 Unspecified viral hepatitis C without hepatic coma: Secondary | ICD-10-CM

## 2012-03-28 DIAGNOSIS — K7689 Other specified diseases of liver: Secondary | ICD-10-CM | POA: Diagnosis not present

## 2012-03-28 DIAGNOSIS — N281 Cyst of kidney, acquired: Secondary | ICD-10-CM | POA: Diagnosis not present

## 2012-05-29 DIAGNOSIS — E1139 Type 2 diabetes mellitus with other diabetic ophthalmic complication: Secondary | ICD-10-CM | POA: Diagnosis not present

## 2012-08-29 DIAGNOSIS — Q828 Other specified congenital malformations of skin: Secondary | ICD-10-CM | POA: Diagnosis not present

## 2012-09-04 DIAGNOSIS — I10 Essential (primary) hypertension: Secondary | ICD-10-CM | POA: Diagnosis not present

## 2012-09-04 DIAGNOSIS — I359 Nonrheumatic aortic valve disorder, unspecified: Secondary | ICD-10-CM | POA: Diagnosis not present

## 2012-09-04 DIAGNOSIS — E7800 Pure hypercholesterolemia, unspecified: Secondary | ICD-10-CM | POA: Diagnosis not present

## 2012-09-04 DIAGNOSIS — E119 Type 2 diabetes mellitus without complications: Secondary | ICD-10-CM | POA: Diagnosis not present

## 2012-09-18 DIAGNOSIS — Z0000 Encounter for general adult medical examination without abnormal findings: Secondary | ICD-10-CM | POA: Diagnosis not present

## 2012-09-18 DIAGNOSIS — E785 Hyperlipidemia, unspecified: Secondary | ICD-10-CM | POA: Diagnosis not present

## 2012-09-18 DIAGNOSIS — Z1331 Encounter for screening for depression: Secondary | ICD-10-CM | POA: Diagnosis not present

## 2012-09-18 DIAGNOSIS — E119 Type 2 diabetes mellitus without complications: Secondary | ICD-10-CM | POA: Diagnosis not present

## 2012-09-18 DIAGNOSIS — I10 Essential (primary) hypertension: Secondary | ICD-10-CM | POA: Diagnosis not present

## 2012-09-18 DIAGNOSIS — M25519 Pain in unspecified shoulder: Secondary | ICD-10-CM | POA: Diagnosis not present

## 2012-09-28 DIAGNOSIS — M19019 Primary osteoarthritis, unspecified shoulder: Secondary | ICD-10-CM | POA: Diagnosis not present

## 2013-02-28 DIAGNOSIS — Z23 Encounter for immunization: Secondary | ICD-10-CM | POA: Diagnosis not present

## 2013-03-21 DIAGNOSIS — B1920 Unspecified viral hepatitis C without hepatic coma: Secondary | ICD-10-CM | POA: Diagnosis not present

## 2013-03-21 DIAGNOSIS — I10 Essential (primary) hypertension: Secondary | ICD-10-CM | POA: Diagnosis not present

## 2013-03-21 DIAGNOSIS — E785 Hyperlipidemia, unspecified: Secondary | ICD-10-CM | POA: Diagnosis not present

## 2013-03-21 DIAGNOSIS — E119 Type 2 diabetes mellitus without complications: Secondary | ICD-10-CM | POA: Diagnosis not present

## 2013-03-21 DIAGNOSIS — I359 Nonrheumatic aortic valve disorder, unspecified: Secondary | ICD-10-CM | POA: Diagnosis not present

## 2013-04-22 ENCOUNTER — Encounter (HOSPITAL_COMMUNITY): Payer: Self-pay | Admitting: Emergency Medicine

## 2013-04-22 ENCOUNTER — Inpatient Hospital Stay (HOSPITAL_COMMUNITY)
Admission: EM | Admit: 2013-04-22 | Discharge: 2013-04-24 | DRG: 812 | Disposition: A | Discharge status: Home / Self Care | Payer: Medicare Other | Attending: Internal Medicine | Admitting: Internal Medicine

## 2013-04-22 ENCOUNTER — Emergency Department (HOSPITAL_COMMUNITY): Payer: Medicare Other

## 2013-04-22 DIAGNOSIS — R011 Cardiac murmur, unspecified: Secondary | ICD-10-CM

## 2013-04-22 DIAGNOSIS — Z7982 Long term (current) use of aspirin: Secondary | ICD-10-CM | POA: Diagnosis not present

## 2013-04-22 DIAGNOSIS — I359 Nonrheumatic aortic valve disorder, unspecified: Secondary | ICD-10-CM | POA: Diagnosis present

## 2013-04-22 DIAGNOSIS — I10 Essential (primary) hypertension: Secondary | ICD-10-CM | POA: Diagnosis present

## 2013-04-22 DIAGNOSIS — E1165 Type 2 diabetes mellitus with hyperglycemia: Secondary | ICD-10-CM | POA: Diagnosis present

## 2013-04-22 DIAGNOSIS — E119 Type 2 diabetes mellitus without complications: Secondary | ICD-10-CM | POA: Diagnosis not present

## 2013-04-22 DIAGNOSIS — R059 Cough, unspecified: Secondary | ICD-10-CM | POA: Diagnosis not present

## 2013-04-22 DIAGNOSIS — B182 Chronic viral hepatitis C: Secondary | ICD-10-CM | POA: Diagnosis present

## 2013-04-22 DIAGNOSIS — D649 Anemia, unspecified: Secondary | ICD-10-CM

## 2013-04-22 DIAGNOSIS — R0602 Shortness of breath: Secondary | ICD-10-CM | POA: Diagnosis not present

## 2013-04-22 DIAGNOSIS — R05 Cough: Secondary | ICD-10-CM | POA: Diagnosis not present

## 2013-04-22 DIAGNOSIS — K2970 Gastritis, unspecified, without bleeding: Secondary | ICD-10-CM | POA: Diagnosis present

## 2013-04-22 DIAGNOSIS — D500 Iron deficiency anemia secondary to blood loss (chronic): Secondary | ICD-10-CM

## 2013-04-22 DIAGNOSIS — Z79899 Other long term (current) drug therapy: Secondary | ICD-10-CM | POA: Diagnosis not present

## 2013-04-22 DIAGNOSIS — E7800 Pure hypercholesterolemia, unspecified: Secondary | ICD-10-CM | POA: Diagnosis present

## 2013-04-22 DIAGNOSIS — K2980 Duodenitis without bleeding: Secondary | ICD-10-CM | POA: Diagnosis not present

## 2013-04-22 DIAGNOSIS — K2990 Gastroduodenitis, unspecified, without bleeding: Secondary | ICD-10-CM | POA: Diagnosis not present

## 2013-04-22 DIAGNOSIS — R002 Palpitations: Secondary | ICD-10-CM | POA: Diagnosis not present

## 2013-04-22 DIAGNOSIS — I152 Hypertension secondary to endocrine disorders: Secondary | ICD-10-CM | POA: Diagnosis present

## 2013-04-22 HISTORY — DX: Chronic viral hepatitis C: B18.2

## 2013-04-22 HISTORY — DX: Type 2 diabetes mellitus without complications: E11.9

## 2013-04-22 HISTORY — DX: Pure hypercholesterolemia, unspecified: E78.00

## 2013-04-22 LAB — BASIC METABOLIC PANEL
GFR calc non Af Amer: >90 mL/min (ref >90)
Glucose, Bld: 137 mg/dL — ABNORMAL HIGH (ref 70–99)
Potassium: 3.8 mEq/L (ref 3.5–5.1)
Sodium: 135 mEq/L (ref 135–145)

## 2013-04-22 LAB — CBC WITH DIFFERENTIAL/PLATELET
Basophils Relative: 2 % — ABNORMAL HIGH (ref 0–1)
Eosinophils Absolute: 0 10*3/uL (ref 0.0–0.7)
Eosinophils Relative: 1 % (ref 0–5)
Hemoglobin: 6 g/dL — CL (ref 13.0–17.0)
Lymphocytes Relative: 48 % — ABNORMAL HIGH (ref 12–46)
MCHC: 29.9 g/dL — ABNORMAL LOW (ref 30.0–36.0)
Neutrophils Relative %: 36 % — ABNORMAL LOW (ref 43–77)
RBC: 2.81 MIL/uL — ABNORMAL LOW (ref 4.22–5.81)

## 2013-04-22 LAB — PREPARE RBC (CROSSMATCH)

## 2013-04-22 LAB — TROPONIN I: Troponin I: <0.3 ng/mL (ref <0.30)

## 2013-04-22 NOTE — ED Notes (Signed)
Pt states he's recently felt SOB, tired, hard walking up stairs. Pt states he mixed some chemicals and cleaning supplies a couple weeks ago and had a reaction. Pt in NAD, ambulatory to room. Denies pain.

## 2013-04-22 NOTE — ED Notes (Signed)
Dr Piedad Climes made aware of pt hemoglobin

## 2013-04-22 NOTE — ED Notes (Signed)
Though he was coming down w/ cold and inhaled some chemicals cleaning fluid x 3 weeks ago etc now sob is worse

## 2013-04-23 ENCOUNTER — Encounter (HOSPITAL_COMMUNITY): Payer: Self-pay | Admitting: Physician Assistant

## 2013-04-23 DIAGNOSIS — E1165 Type 2 diabetes mellitus with hyperglycemia: Secondary | ICD-10-CM | POA: Diagnosis present

## 2013-04-23 DIAGNOSIS — R0602 Shortness of breath: Secondary | ICD-10-CM | POA: Diagnosis not present

## 2013-04-23 DIAGNOSIS — D500 Iron deficiency anemia secondary to blood loss (chronic): Secondary | ICD-10-CM | POA: Diagnosis not present

## 2013-04-23 DIAGNOSIS — D649 Anemia, unspecified: Secondary | ICD-10-CM | POA: Diagnosis not present

## 2013-04-23 DIAGNOSIS — I152 Hypertension secondary to endocrine disorders: Secondary | ICD-10-CM | POA: Diagnosis present

## 2013-04-23 DIAGNOSIS — R011 Cardiac murmur, unspecified: Secondary | ICD-10-CM | POA: Diagnosis not present

## 2013-04-23 DIAGNOSIS — E119 Type 2 diabetes mellitus without complications: Secondary | ICD-10-CM | POA: Diagnosis not present

## 2013-04-23 DIAGNOSIS — I10 Essential (primary) hypertension: Secondary | ICD-10-CM

## 2013-04-23 LAB — HEPATIC FUNCTION PANEL
ALT: 31 U/L (ref 0–53)
AST: 38 U/L — ABNORMAL HIGH (ref 0–37)
Alkaline Phosphatase: 56 U/L (ref 39–117)
Bilirubin, Direct: <0.1 mg/dL (ref 0.0–0.3)
Total Bilirubin: 0.2 mg/dL — ABNORMAL LOW (ref 0.3–1.2)

## 2013-04-23 LAB — CBC
HCT: 22.4 % — ABNORMAL LOW (ref 39.0–52.0)
Hemoglobin: 7 g/dL — ABNORMAL LOW (ref 13.0–17.0)
MCH: 23 pg — ABNORMAL LOW (ref 26.0–34.0)
MCHC: 31.3 g/dL (ref 30.0–36.0)
RDW: 19.8 % — ABNORMAL HIGH (ref 11.5–15.5)
WBC: 3.4 10*3/uL — ABNORMAL LOW (ref 4.0–10.5)

## 2013-04-23 LAB — COMPREHENSIVE METABOLIC PANEL
ALT: 28 U/L (ref 0–53)
AST: 34 U/L (ref 0–37)
Albumin: 3.5 g/dL (ref 3.5–5.2)
Alkaline Phosphatase: 46 U/L (ref 39–117)
BUN: 9 mg/dL (ref 6–23)
Calcium: 9.1 mg/dL (ref 8.4–10.5)
Chloride: 103 mEq/L (ref 96–112)
Creatinine, Ser: 0.72 mg/dL (ref 0.50–1.35)
GFR calc non Af Amer: >90 mL/min (ref >90)
Potassium: 3.6 mEq/L (ref 3.5–5.1)
Total Bilirubin: 0.4 mg/dL (ref 0.3–1.2)

## 2013-04-23 LAB — PROTIME-INR
INR: 1.09 (ref 0.00–1.49)
Prothrombin Time: 13.9 seconds (ref 11.6–15.2)

## 2013-04-23 LAB — IRON AND TIBC
Saturation Ratios: 2 % — ABNORMAL LOW (ref 20–55)
UIBC: 522 ug/dL — ABNORMAL HIGH (ref 125–400)

## 2013-04-23 LAB — GLUCOSE, CAPILLARY
Glucose-Capillary: 132 mg/dL — ABNORMAL HIGH (ref 70–99)
Glucose-Capillary: 106 mg/dL — ABNORMAL HIGH (ref 70–99)
Glucose-Capillary: 159 mg/dL — ABNORMAL HIGH (ref 70–99)
Glucose-Capillary: 155 mg/dL — ABNORMAL HIGH (ref 70–99)

## 2013-04-23 LAB — TROPONIN I: Troponin I: <0.3 ng/mL (ref <0.30)

## 2013-04-23 MED ORDER — INSULIN ASPART 100 UNIT/ML ~~LOC~~ SOLN: 0.0000 [IU] | Freq: Every day | SUBCUTANEOUS | Status: DC

## 2013-04-23 MED ORDER — PANTOPRAZOLE SODIUM 40 MG PO TBEC
40.0000 mg | DELAYED_RELEASE_TABLET | Freq: Every day | ORAL | Status: DC
  Administered 2013-04-24: 40 mg via ORAL
  Filled 2013-04-23: qty 1

## 2013-04-23 MED ORDER — ACETAMINOPHEN 650 MG RE SUPP: 650.0000 mg | Freq: Four times a day (QID) | RECTAL | Status: DC | PRN

## 2013-04-23 MED ORDER — LISINOPRIL-HYDROCHLOROTHIAZIDE 20-25 MG PO TABS: 1.0000 | ORAL_TABLET | Freq: Every day | ORAL | Status: DC

## 2013-04-23 MED ORDER — LISINOPRIL 20 MG PO TABS
20.0000 mg | ORAL_TABLET | Freq: Every day | ORAL | Status: DC
  Administered 2013-04-23 – 2013-04-24 (×2): 20 mg via ORAL
  Filled 2013-04-23 (×2): qty 1

## 2013-04-23 MED ORDER — ATORVASTATIN CALCIUM 20 MG PO TABS
20.0000 mg | ORAL_TABLET | Freq: Every day | ORAL | Status: DC
  Administered 2013-04-23: 20 mg via ORAL
  Filled 2013-04-23 (×2): qty 1

## 2013-04-23 MED ORDER — INSULIN ASPART 100 UNIT/ML ~~LOC~~ SOLN
0.0000 [IU] | Freq: Four times a day (QID) | SUBCUTANEOUS | Status: DC
  Administered 2013-04-23: 2 [IU] via SUBCUTANEOUS
  Administered 2013-04-23 – 2013-04-24 (×2): 1 [IU] via SUBCUTANEOUS

## 2013-04-23 MED ORDER — ACETAMINOPHEN 325 MG PO TABS: 650.0000 mg | ORAL_TABLET | Freq: Four times a day (QID) | ORAL | Status: DC | PRN

## 2013-04-23 MED ORDER — PANTOPRAZOLE SODIUM 40 MG IV SOLR
40.0000 mg | Freq: Two times a day (BID) | INTRAVENOUS | Status: DC
  Administered 2013-04-23: 40 mg via INTRAVENOUS
  Filled 2013-04-23 (×2): qty 40

## 2013-04-23 MED ORDER — SODIUM CHLORIDE 0.9 % IV SOLN
INTRAVENOUS | Status: DC
  Administered 2013-04-24: 08:00:00 20 mL/h via INTRAVENOUS

## 2013-04-23 MED ORDER — METOPROLOL SUCCINATE ER 25 MG PO TB24
25.0000 mg | ORAL_TABLET | Freq: Every day | ORAL | Status: DC
  Administered 2013-04-23 – 2013-04-24 (×2): 25 mg via ORAL
  Filled 2013-04-23 (×2): qty 1

## 2013-04-23 MED ORDER — SODIUM CHLORIDE 0.9 % IJ SOLN
3.0000 mL | Freq: Two times a day (BID) | INTRAMUSCULAR | Status: DC
  Administered 2013-04-23 (×2): 3 mL via INTRAVENOUS

## 2013-04-23 MED ORDER — SODIUM CHLORIDE 0.9 % IV SOLN
25.0000 mg | Freq: Once | INTRAVENOUS | Status: AC
  Administered 2013-04-23: 25 mg via INTRAVENOUS
  Filled 2013-04-23: qty 0.5

## 2013-04-23 MED ORDER — SODIUM CHLORIDE 0.9 % IV SOLN
80.0000 mg | Freq: Once | INTRAVENOUS | Status: AC
  Administered 2013-04-23: 01:00:00 80 mg via INTRAVENOUS
  Filled 2013-04-23: qty 80

## 2013-04-23 MED ORDER — ASPIRIN EC 81 MG PO TBEC
81.0000 mg | DELAYED_RELEASE_TABLET | Freq: Every day | ORAL | Status: DC
  Filled 2013-04-23: qty 1

## 2013-04-23 MED ORDER — AMLODIPINE BESYLATE 10 MG PO TABS
10.0000 mg | ORAL_TABLET | Freq: Every day | ORAL | Status: DC
  Administered 2013-04-23 – 2013-04-24 (×2): 10 mg via ORAL
  Filled 2013-04-23 (×2): qty 1

## 2013-04-23 MED ORDER — HYDROCHLOROTHIAZIDE 25 MG PO TABS
25.0000 mg | ORAL_TABLET | Freq: Every day | ORAL | Status: DC
  Administered 2013-04-23 – 2013-04-24 (×2): 25 mg via ORAL
  Filled 2013-04-23 (×2): qty 1

## 2013-04-23 MED ORDER — SODIUM CHLORIDE 0.9 % IV SOLN
1000.0000 mg | Freq: Once | INTRAVENOUS | Status: AC
  Administered 2013-04-23: 1000 mg via INTRAVENOUS
  Filled 2013-04-23 (×2): qty 20

## 2013-04-23 NOTE — Consult Note (Addendum)
[facility] Consult: 10:29 AM 04/23/2013  LOS: 1 day    Referring Provider:  Arthor Captain md Primary Care Physician:  Darrow Bussing, MD Primary Gastroenterologist:  Dr. Arlyce Dice     Reason for Consultation:  Anemia and dark stools.   HPI: [name] is a 72 y.o. male.  Hx Hep C, underwent anti viral therapy around 2003 at [facility].  Ultrasound in 2013 showed just a fatty liver .  S/P 01/2010 screening colonoscopy with internal hemorrhoids.  No EGDs in past.   A few weeks of fatigue, DOE, worse was last week when he had dizziness.  Seen by PMD yest and found to be anemic, Hgb of 6 c/w 16.7 in 05/2009.  Ferritin is 4.  Sent to ED for admission/transfusion. Stools were dark and stuck to sides of commode in last week or so.  No abdominal pain, no anorexia, no weight loss, no edema, no chest pain. Daily baby ASA.  Use of Ibuprofen 200 mg 3 times in the last month.  No etoh.    Now getting his second unit of blood. Also to receive iron dextran infusion.  Never treated with po iron.     Past Medical History  Diagnosis Date  . Hypertension   . Diabetes mellitus without complication   . Hep C w/o coma, chronic   . Hypercholesterolemia     Past Surgical History  Procedure Laterality Date  . Laminectomy  06/2005    Dr Jeral Fruit. multiple lumbar level laminectomies.   . Knee arthroscopy Left 05/2009    Dr Leslee Home  . 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     Prior to Admission medications   Medication Sig Start Date End Date Taking? Authorizing Provider  amLODipine (NORVASC) 10 MG tablet Take 10 mg by mouth daily.   Yes Historical Provider, MD  aspirin EC 81 MG tablet Take 81 mg by mouth at bedtime.   Yes Historical Provider, MD  atorvastatin (LIPITOR) 20 MG tablet Take 20 mg by mouth  at bedtime.   Yes Historical Provider, MD  ibuprofen (ADVIL,MOTRIN) 200 MG tablet Take 200 mg by mouth every 6 (six) hours as needed for mild pain. For pain   Yes Historical Provider, MD  lisinopril-hydrochlorothiazide (PRINZIDE,ZESTORETIC) 20-25 MG per tablet Take 1 tablet by mouth daily.   Yes Historical Provider, MD  metFORMIN (GLUCOPHAGE) 500 MG tablet Take 1,000-1,500 mg by mouth 2 (two) times daily with a meal. Take 2 tabs in the morning and 3 tabs in the evening   Yes Historical Provider, MD  metoprolol succinate (TOPROL-XL) 25 MG 24 hr tablet Take 25 mg by mouth daily.   Yes Historical Provider, MD    Scheduled Meds: . amLODipine  10 mg Oral Daily  . [START ON 04/24/2013] aspirin EC  81 mg Oral QHS  . atorvastatin  20 mg Oral QHS  . hydrochlorothiazide  25 mg Oral Daily  . insulin aspart  0-5 Units Subcutaneous QHS  . insulin aspart  0-9 Units Subcutaneous Q6H  . lisinopril  20 mg  Oral Daily  . metoprolol succinate  25 mg Oral Daily  . pantoprazole (PROTONIX) IV  40 mg Intravenous Q12H  . sodium chloride  3 mL Intravenous Q12H   Infusions:   PRN Meds: acetaminophen, acetaminophen   Allergies as of 04/22/2013  . (No Known Allergies)    No family hx of anemia Brother may have had stomach cancer.   History   Social History  . Marital Status: Married    Spouse Name: N/A    Number of Children: N/A  . Years of Education: N/A   Occupational History  . Not on file.   Social History Main Topics  . Smoking status: Never Smoker   . Smokeless tobacco: Not on file  . Alcohol Use: none  . Drug Use: Denies hx injection drug use or snorting drugs.   . Sexual Activity: Not on file   Other Topics Concern  . Not on file   Social History Narrative  . No narrative on file    REVIEW OF SYSTEMS: Constitutional:  Stable weight ENT:  No nose bleeds Pulm:  No cough.  + DOE CV:  No palpitations, no LE edema.  GU:  No hematuria, no frequency GI:  Per HPI.  No  dysphagia Heme:  Per HPI   Transfusions:  Around 2009 for anemia without GI bleed Neuro:  No headaches, no peripheral tingling or numbness Psych:  Nightmares and agitation during sleep hours for one month.  No confusion Derm:  No itching, no rash or sores.  Endocrine:  No sweats or chills.  No polyuria or dysuria Immunization:  Flu shot currentl Travel:  None beyond local counties in last few months.    PHYSICAL EXAM: Vital signs in last 24 hours: Filed Vitals:   04/23/13 0924  BP: 137/74  Pulse: 67  Temp: 98.2 F (36.8 C)  Resp: 18   Wt Readings from Last 3 Encounters:  04/23/13 63.3 kg (139 lb 8.8 oz)    General: looks well and comfortable Head:  No asymmetry    Eyes:  No icterus, no pallor Ears:  Not HOH  Nose:  No discharge Mouth:  Clear moist oral mm.  Full denture above, partial below.  Neck:  No jvd, no TMG Lungs:  Clear bil Heart: RRR.  2/6 systolic murmer Abdomen:  Soft, no mass, not tender, active BS.  No HSM, no bruits. .   Rectal: scant stool tests FOB +, too little to determine color of stool   Musc/Skeltl: no joint contracture or swelling Extremities:  No pedal edema  Neurologic:  No tremor. Oriented x 3.  No limb weakness.  Skin:  No AVMs, no rash, no sores Tattoos:  none Nodes:  No cervical adenopathy   Psych:  Pleasant, cooperative.  Relaxed.   Intake/Output from previous day: 12/15 0701 - 12/16 0700 In: 12.5 [Blood:12.5] Out: 800 [Urine:800] Intake/Output this shift: Total I/O In: 12.5 [Blood:12.5] Out: 500 [Urine:500]  LAB RESULTS:  Recent Labs  04/22/13 2123 04/23/13 0600  WBC 3.9* 3.4*  HGB 6.0* 7.0*  HCT 20.1* 22.4*  PLT 200 185   BMET Lab Results  Component Value Date   NA 135 04/23/2013   NA 135 04/22/2013   NA 138 05/12/2009   K 3.6 04/23/2013   K 3.8 04/22/2013   K 4.1 05/12/2009   CL 103 04/23/2013   CL 100 04/22/2013   CO2 19 04/23/2013   CO2 25 04/22/2013   GLUCOSE 87 04/23/2013   GLUCOSE 137* 04/22/2013  GLUCOSE  136* 05/12/2009   BUN 9 04/23/2013   BUN 12 04/22/2013   CREATININE 0.72 04/23/2013   CREATININE 0.72 04/22/2013   CALCIUM 9.1 04/23/2013   CALCIUM 9.5 04/22/2013   LFT  Recent Labs  04/22/13 2214 04/23/13 0600  PROT 9.1* 8.2  ALBUMIN 3.9 3.5  AST 38* 34  ALT 31 28  ALKPHOS 56 46  BILITOT 0.2* 0.4  BILIDIR <0.1  --   IBILI NOT CALCULATED  --    PT/INR Lab Results  Component Value Date   INR 1.09 04/23/2013   INR 1.1 02/28/2007   RADIOLOGY STUDIES: Dg Chest 2 View  04/22/2013   CLINICAL DATA:  Shortness of breath and cough.  EXAM: CHEST  2 VIEW  COMPARISON:  06/19/2005.  FINDINGS: The heart is normal in size and stable. The lungs are clear. No pleural effusion. The bony thorax is intact. Bilateral nipple shadows are noted.  IMPRESSION: No acute cardiopulmonary findings.   Electronically Signed   By: Loralie Champagne M.D.   On: 04/22/2013 15:12    ENDOSCOPIC STUDIES: 01/2010  Colonoscopy  Dr Arlyce Dice Routine screening ENDOSCOPIC IMPRESSION:  1) Internal hemorrhoids  2) Otherwise normal examination  RECOMMENDATIONS:  1) Continue current colorectal screening recommendations for  "routine risk" patients with a repeat colonoscopy in 10 years.   IMPRESSION:   *  Anemia, FPB + stool, dark stools.  Rule out ulcers, rule out AVMs, rule out varices or portal hypertenision.   *  Hx Hep C, treated with antiviral therapy at [facility] ~2003/2004  *  Fatty liver on 03/2012 ultrasound.   *  NIDDM    PLAN:     *  EGD.  Tomorrow at 9 AM with Dr Rhea Belton.  Po Protonix.  Can eat today.    Jennye Moccasin  04/23/2013, 10:29 AM Pager: [phone]  Chart was reviewed and patient was examined. X-rays and lab were reviewed.    I agree with management and plans.  Subacute GI bleed, Fe deficiency anemia, , probably NSAID- related.  R/o active PUD, AVMs, neoplasm.  Plan to hold NSAIDS, EGD in am.  Barbette Hair. Arlyce Dice, M.D., [facility] Cell [phone]

## 2013-04-23 NOTE — H&P (Signed)
@LOGO @ [facility] History and Physical  Patient: [name]  WGN:[id number]  DOB: [birth year]  DOS: the patient was seen and examined on 04/23/2013 PCP: Darrow Bussing, MD  Chief Complaint: Dizziness and shortness of breath  HPI: LASHUN MCCANTS is a 72 y.o. male with Past medical history of hypertension, diabetes, chronic hep C. The patient is coming from home. The patient mentions that since last 3-4 weeks he has been having shortness of breath. He has been cleaning household with bleach and since then his symptoms were worse and therefore he thought it was effect from the chemicals. But since his symptoms were worsening and this was associated with some tightness in the chest and then to see his PCP order for the patient here. The patient himself currently denies any complaints of shortness of breath orthopnea PND palpitation chest pain or chest tightness. He also denies any complaints of nausea or vomiting abdominal pain diarrhea or constipation active bleeding or melena. He does mention he had some dark hard stool. He had history of colonoscopy but denies any history of upper GI endoscopy.  Review of Systems: as mentioned in the history of present illness.  A Comprehensive review of the other systems is negative.  Past Medical History  Diagnosis Date  . Hypertension   . Diabetes mellitus without complication   . Hep C w/o coma, chronic    History reviewed. No pertinent past surgical history. Social History:  reports that he has never smoked. He does not have any smokeless tobacco history on file. He reports that he drinks alcohol. His drug history is not on file. Independent for most of his  ADL.  No Known Allergies  No family history on file.  Prior to Admission medications   Medication Sig Start Date End Date Taking? Authorizing Provider  amLODipine (NORVASC) 10 MG tablet Take 10 mg by mouth daily.   Yes Historical Provider, MD  aspirin EC 81 MG tablet Take 81 mg by  mouth at bedtime.   Yes Historical Provider, MD  atorvastatin (LIPITOR) 20 MG tablet Take 20 mg by mouth at bedtime.   Yes Historical Provider, MD  ibuprofen (ADVIL,MOTRIN) 200 MG tablet Take 200 mg by mouth every 6 (six) hours as needed for mild pain. For pain   Yes Historical Provider, MD  lisinopril-hydrochlorothiazide (PRINZIDE,ZESTORETIC) 20-25 MG per tablet Take 1 tablet by mouth daily.   Yes Historical Provider, MD  metFORMIN (GLUCOPHAGE) 500 MG tablet Take 1,000-1,500 mg by mouth 2 (two) times daily with a meal. Take 2 tabs in the morning and 3 tabs in the evening   Yes Historical Provider, MD  metoprolol succinate (TOPROL-XL) 25 MG 24 hr tablet Take 25 mg by mouth daily.   Yes Historical Provider, MD    Physical Exam: Filed Vitals:   04/22/13 2248 04/22/13 2315 04/22/13 2348 04/22/13 2351  BP: 144/73 134/67 141/67   Pulse: 74 68 70   Temp:   98.1 F (36.7 C)   TempSrc:   Oral   Resp:   18   Height:    6' (1.829 m)  Weight:    63.368 kg (139 lb 11.2 oz)  SpO2: 100% 100% 100%     General: Alert, Awake and Oriented to Time, Place and Person. Appear in mild distress Eyes: PERRL ENT: Oral Mucosa clear moist. Neck: no JVD Cardiovascular: S1 and S2 Present, significant aortic systolic Murmur, Peripheral Pulses Present Respiratory: Bilateral Air entry equal and Decreased, Clear to Auscultation,  no Crackles,no wheezes Abdomen:  Bowel Sound Present, Soft and Non tender Skin: no Rash Extremities: no Pedal edema, no calf tenderness Neurologic: Grossly Unremarkable.  Labs on Admission:  CBC:  Recent Labs Lab 04/22/13 2123  WBC 3.9*  NEUTROABS 1.4*  HGB 6.0*  HCT 20.1*  MCV 71.5*  PLT 200    CMP     Component Value Date/Time   NA 135 04/22/2013 2123   K 3.8 04/22/2013 2123   CL 100 04/22/2013 2123   CO2 25 04/22/2013 2123   GLUCOSE 137* 04/22/2013 2123   BUN 12 04/22/2013 2123   CREATININE 0.72 04/22/2013 2123   CALCIUM 9.5 04/22/2013 2123   PROT 9.1* 04/22/2013  2214   ALBUMIN 3.9 04/22/2013 2214   AST 38* 04/22/2013 2214   ALT 31 04/22/2013 2214   ALKPHOS 56 04/22/2013 2214   BILITOT 0.2* 04/22/2013 2214   GFRNONAA >90 04/22/2013 2123   GFRAA >90 04/22/2013 2123    No results found for this basename: LIPASE, AMYLASE,  in the last 168 hours No results found for this basename: AMMONIA,  in the last 168 hours   Recent Labs Lab 04/22/13 2123  TROPONINI <0.30   BNP (last 3 results) No results found for this basename: PROBNP,  in the last 8760 hours  Radiological Exams on Admission: Dg Chest 2 View  04/22/2013   CLINICAL DATA:  Shortness of breath and cough.  EXAM: CHEST  2 VIEW  COMPARISON:  06/19/2005.  FINDINGS: The heart is normal in size and stable. The lungs are clear. No pleural effusion. The bony thorax is intact. Bilateral nipple shadows are noted.  IMPRESSION: No acute cardiopulmonary findings.   Electronically Signed   By: Loralie Champagne M.D.   On: 04/22/2013 15:12    Assessment/Plan Principal Problem:   Anemia Active Problems:   Shortness of breath   Hypertension   Diabetes mellitus without complication   1. Anemia The patient is presenting with symptomatic anemia with shortness of breath and chest palpitation associated with hemoglobin of 6 without any active bleeding. He has negative troponin and EKG that is suggestive of lvh. Present monitor blood transfusion and follow his serial H&H. I would also obtain iron studies for him and Hemoccult. I will check hepatic function panel as well as LDH I would also put him on Protonix 40 mg twice a day, he may require GI consultation for further workup of his anemia.  2. Aortic murmur Obtain an echocardiogram for his shortness of breath  3. Hypertension Continue home antihypertensives  4. Diabetes mellitus Holding metformin and placing the patient on sliding scale   DVT Prophylaxis: mechanical compression device Nutrition: N.p.o.  Code Status: Full  Disposition:  Admitted to inpatient in telemetry unit.  Author: Lynden Oxford, MD [facility] Pager: [phone] 04/23/2013, 12:53 AM    If 7PM-7AM, please contact night-coverage [email or web address] Password [email or web address]

## 2013-04-23 NOTE — ED Provider Notes (Signed)
CSN: [id number]     Arrival date & time 04/22/13  1416 History   First MD Initiated Contact with Patient 04/22/13 2030     Chief Complaint  Patient presents with  . Shortness of Breath   (Consider location/radiation/quality/duration/timing/severity/associated sxs/prior Treatment) HPI [name] is a 72 y.o. male who presents to the emergency department from PCP for concern of CP/SOB.  Patient reports that over the last 4 weeks that any time that he does anything he feels weak and feels a tightness in his chest.  He attributes these symptoms to inhaling some noxious fumes from using bleach three weeks ago but he is unsure.  Never had this prior to a month ago.  Described as mild.  No alleviating factors.  Saw PCP for this who referred here for further evaluation.  Past Medical History  Diagnosis Date  . Hypertension   . Diabetes mellitus without complication   . Hep C w/o coma, chronic    History reviewed. No pertinent past surgical history. Family History: Reviewed.  None pertinent.    History  Substance Use Topics  . Smoking status: Never Smoker   . Smokeless tobacco: Not on file  . Alcohol Use: Yes    Review of Systems  Constitutional: Positive for fatigue. Negative for fever and chills.  HENT: Negative for congestion and sore throat.   Respiratory: Positive for shortness of breath. Negative for cough.   Cardiovascular: Positive for chest pain. Negative for palpitations and leg swelling.  Gastrointestinal: Negative for nausea, vomiting, abdominal pain, diarrhea and constipation.  Endocrine: Negative for polyuria.  Genitourinary: Negative for dysuria and hematuria.  Musculoskeletal: Negative for neck pain.  Skin: Negative for rash.  Neurological: Negative for headaches.  Psychiatric/Behavioral: Negative.   All other systems reviewed and are negative.    Allergies  Review of patient's allergies indicates no known allergies.  Home Medications  No current outpatient  prescriptions on file. BP 142/72  Pulse 70  Temp(Src) 97.7 F (36.5 C) (Oral)  Resp 18  Ht 6' (1.829 m)  Wt 139 lb 11.2 oz (63.368 kg)  BMI 18.94 kg/m2  SpO2 100% Physical Exam  Nursing note and vitals reviewed. Constitutional: He is oriented to person, place, and time. He appears well-developed and well-nourished. No distress.  HENT:  Head: Normocephalic and atraumatic.  Right Ear: External ear normal.  Left Ear: External ear normal.  Mouth/Throat: Oropharynx is clear and moist. No oropharyngeal exudate.  Eyes: Conjunctivae are normal. Pupils are equal, round, and reactive to light. Right eye exhibits no discharge.  Neck: Normal range of motion. Neck supple. No tracheal deviation present.  Cardiovascular: Normal rate, regular rhythm and intact distal pulses.   Murmur heard.  Systolic murmur is present with a grade of 4/6  Pulmonary/Chest: Effort normal. No respiratory distress. He has no wheezes. He has no rales.  Abdominal: Soft. He exhibits no distension. There is no tenderness. There is no rebound and no guarding.  Musculoskeletal: Normal range of motion.  Neurological: He is alert and oriented to person, place, and time.  Skin: Skin is warm and dry. No rash noted. He is not diaphoretic.  Psychiatric: He has a normal mood and affect.    ED Course  Procedures (including critical care time) Labs Review Labs Reviewed  CBC WITH DIFFERENTIAL - Abnormal; Notable for the following:    WBC 3.9 (*)    RBC 2.81 (*)    Hemoglobin 6.0 (*)    HCT 20.1 (*)    MCV  71.5 (*)    MCH 21.4 (*)    MCHC 29.9 (*)    RDW 20.1 (*)    Neutrophils Relative % 36 (*)    Lymphocytes Relative 48 (*)    Monocytes Relative 13 (*)    Basophils Relative 2 (*)    Neutro Abs 1.4 (*)    All other components within normal limits  BASIC METABOLIC PANEL - Abnormal; Notable for the following:    Glucose, Bld 137 (*)    All other components within normal limits  HEPATIC FUNCTION PANEL - Abnormal;  Notable for the following:    Total Protein 9.1 (*)    AST 38 (*)    Total Bilirubin 0.2 (*)    All other components within normal limits  TROPONIN I  LACTATE DEHYDROGENASE  FERRITIN  IRON AND TIBC  COMPREHENSIVE METABOLIC PANEL  PROTIME-INR  CBC  PREPARE RBC (CROSSMATCH)  TYPE AND SCREEN  ABO/RH   Imaging Review Dg Chest 2 View  04/22/2013   CLINICAL DATA:  Shortness of breath and cough.  EXAM: CHEST  2 VIEW  COMPARISON:  06/19/2005.  FINDINGS: The heart is normal in size and stable. The lungs are clear. No pleural effusion. The bony thorax is intact. Bilateral nipple shadows are noted.  IMPRESSION: No acute cardiopulmonary findings.   Electronically Signed   By: Loralie Champagne M.D.   On: 04/22/2013 15:12    EKG Interpretation    Date/Time:  Monday April 22 2013 14:22:44 EST Ventricular Rate:  70 PR Interval:  152 QRS Duration: 102 QT Interval:  366 QTC Calculation: 395 R Axis:   66 Text Interpretation:  Sinus rhythm with Premature supraventricular complexes Left ventricular hypertrophy with repolarization abnormality Abnormal ECG V2  elevated Confirmed by ZAVITZ  MD, JOSHUA ([id number]) on 04/22/2013 9:34:00 PM            MDM   1. Shortness of breath   2. Heart murmur   3. Anemia    [name] is a 72 y.o. male who presented to the emergency department for concern of CP/SOB/weakness over the last three weeks.  Basic workup initiated and patient found to have Hg of 6.0.  Spoke with patient and patient reports no dark stools/GI bleeding.  He reports that hemoccult was negative today at PCP.  No urinary bleeding.  No recent traumas.  EKG also noted to have repolarization abnormality and LVH.  Exam with systolic murmur c/w aortic stenosis.  Patient will require admission for further workup, transfusion, and observation.  Hospitalists consulted for admission.  Patient admitted.    Arloa Koh, MD 04/23/13 [phone]

## 2013-04-23 NOTE — ED Provider Notes (Signed)
Medical screening examination/treatment/procedure(s) were conducted as a shared visit with non-physician practitioner(s) or resident  and myself.  I personally evaluated the patient during the encounter and agree with the findings and plan unless otherwise indicated.    I have personally reviewed any xrays and/ or EKG's with the provider and I agree with interpretation.    EKG Interpretation    Date/Time:  Monday April 22 2013 14:22:44 EST Ventricular Rate:  70 PR Interval:  152 QRS Duration: 102 QT Interval:  366 QTC Calculation: 395 R Axis:   66 Text Interpretation:  Sinus rhythm with Premature supraventricular complexes Left ventricular hypertrophy with repolarization abnormality Abnormal ECG V2  elevated Confirmed by Eyonna Sandstrom  MD, Zoee Heeney ([id number]) on 04/22/2013 9:34:00 PM           Worsening exertional dyspnea/ fatigue.  Pt denies bleeding, pt has received blood in the past after infection/ surgery.  4+ SM upper sternum, pale skin, mild dry mm, abd soft/ NT, CNs intact.  Anemia- 2 units of blood ordered, one to start in ED.  No acute bleeding in ED.  Admitted for further treatment and evaluation.    Anemia, Dyspnea, heart murmur   Enid Skeens, MD 04/23/13 [phone]

## 2013-04-23 NOTE — Progress Notes (Signed)
[facility] PROGRESS NOTE  [name] EXB:[id number] DOB: 14-May-1940 DOA: 04/22/2013 PCP: Darrow Bussing, MD  Assessment/Plan:  Symptomatic Anemia (hgb 6.0) Chest tightness and palpitations have resolved. Troponin x 1 is wnl. Patient reports 1 week of dark stools Takes Ibuprofen (but not regularly) and a daily 81 mg aspirin.  Not on PPI at home. Received 1 unit of blood last night, a second unit is ordered this morning Protonix IV BID. [facility] consulted.  Patient NPO for possible endoscopy (unfortunately I believe he ate breakfast already)   Chest tightness and palpitations Symptoms resolving with blood transfusion Troponin x 1 wnl, second is still pending Ekg shows LVH   DM CBGs are well controlled with SSI-S as an inpatient. On metformin at home.   HTN Well controlled on home medications. On Lisinopril/HCTZ, norvasc and metoprolol at home.  Hep C Appears stable.  No current issues Was taking treatments for Hep C, but they made him too ill to continue. LFTs WNL.   DVT Prophylaxis:  SCDs (likely GI bleed).  Code Status: full Family Communication: Patient is alert and orientated. Disposition Plan: to home when able.   Consultants:  [facility]  Procedures:    Antibiotics:    HPI/Subjective: Patient reports dark stools for 1 week.  Eats well with little reflux.  Denies weight loss.  Objective: Filed Vitals:   04/23/13 0306 04/23/13 0500 04/23/13 0859 04/23/13 0924  BP: 143/62 141/68 136/71 137/74  Pulse: 73 72 70 67  Temp: 98.4 F (36.9 C) 98.1 F (36.7 C) 97.6 F (36.4 C) 98.2 F (36.8 C)  TempSrc: Oral Oral Oral Oral  Resp: 20 18 18 18   Height:      Weight:  63.3 kg (139 lb 8.8 oz)    SpO2: 100% 100% 100% 100%    Intake/Output Summary (Last 24 hours) at 04/23/13 1022 Last data filed at 04/23/13 1004  Gross per 24 hour  Intake     25 ml  Output   1300 ml  Net  -1275 ml   Filed Weights   04/22/13 1430 04/22/13 2351 04/23/13  0500  Weight: 66.679 kg (147 lb) 63.368 kg (139 lb 11.2 oz) 63.3 kg (139 lb 8.8 oz)    Exam: General: Well developed, well nourished, NAD, appears stated age  HEENT:  PERR, EOMI, Anicteic Sclera, MMM. No pharyngeal erythema or exudates Neck: Supple, no JVD, no masses  Cardiovascular: RRR, S1 S2 auscultated, no rubs or gallops. +systolic murmur 2/6   Respiratory: Clear to auscultation bilaterally with equal chest rise  Abdomen: Soft, nontender, nondistended, + bowel sounds  Extremities: warm dry without cyanosis clubbing or edema.  Neuro: AAOx3, cranial nerves grossly intact. Strength 5/5 in upper and lower extremities  Skin: Without rashes exudates or nodules.   Psych: Normal affect and demeanor with intact judgement and insight   Data Reviewed: Basic Metabolic Panel:  Recent Labs Lab 04/22/13 2123 04/23/13 0600  NA 135 135  K 3.8 3.6  CL 100 103  CO2 25 19  GLUCOSE 137* 87  BUN 12 9  CREATININE 0.72 0.72  CALCIUM 9.5 9.1   Liver Function Tests:  Recent Labs Lab 04/22/13 2214 04/23/13 0600  AST 38* 34  ALT 31 28  ALKPHOS 56 46  BILITOT 0.2* 0.4  PROT 9.1* 8.2  ALBUMIN 3.9 3.5   CBC:  Recent Labs Lab 04/22/13 2123 04/23/13 0600  WBC 3.9* 3.4*  NEUTROABS 1.4*  --   HGB 6.0* 7.0*  HCT 20.1* 22.4*  MCV  71.5* 73.7*  PLT 200 185   Cardiac Enzymes:  Recent Labs Lab 04/22/13 2123  TROPONINI <0.30   CBG:  Recent Labs Lab 04/23/13 0107 04/23/13 0620 04/23/13 0820  GLUCAP 132* 89 106*      Studies: Dg Chest 2 View  04/22/2013   CLINICAL DATA:  Shortness of breath and cough.  EXAM: CHEST  2 VIEW  COMPARISON:  06/19/2005.  FINDINGS: The heart is normal in size and stable. The lungs are clear. No pleural effusion. The bony thorax is intact. Bilateral nipple shadows are noted.  IMPRESSION: No acute cardiopulmonary findings.   Electronically Signed   By: Loralie Champagne M.D.   On: 04/22/2013 15:12    Scheduled Meds: . amLODipine  10 mg Oral Daily   . [START ON 04/24/2013] aspirin EC  81 mg Oral QHS  . atorvastatin  20 mg Oral QHS  . hydrochlorothiazide  25 mg Oral Daily  . insulin aspart  0-5 Units Subcutaneous QHS  . insulin aspart  0-9 Units Subcutaneous Q6H  . lisinopril  20 mg Oral Daily  . metoprolol succinate  25 mg Oral Daily  . pantoprazole (PROTONIX) IV  40 mg Intravenous Q12H  . sodium chloride  3 mL Intravenous Q12H   Continuous Infusions:   Principal Problem:   Anemia Active Problems:   Shortness of breath   Hypertension   Diabetes mellitus without complication    Conley Canal  [facility] Pager [phone]. If 7PM-7AM, please contact night-coverage at [email or web address], password [facility] 04/23/2013, 10:22 AM  LOS: 1 day

## 2013-04-23 NOTE — Progress Notes (Signed)
UR completed. Zainab Crumrine RN CCM Case Mgmt phone [phone] 

## 2013-04-23 NOTE — Progress Notes (Signed)
Addendum  Patient seen and examined, chart and data base reviewed.  I agree with the above assessment and plan.  For full details please see Mrs. Algis Downs PA note.  Microcytic iron deficiency anemia, ferritin is 4. Transfused of 2 units of RBCs, patient will need IV iron infusion.   Clint Lipps, MD [facility] Pager: [phone] 04/23/2013, 11:39 AM

## 2013-04-23 NOTE — Progress Notes (Signed)
Pt tolerated Iron IV well. No complaints of itchiness and no adverse reactions. Vital signs stable.

## 2013-04-24 ENCOUNTER — Encounter (HOSPITAL_COMMUNITY): Payer: Self-pay | Admitting: *Deleted

## 2013-04-24 ENCOUNTER — Encounter: Payer: Self-pay | Admitting: Gastroenterology

## 2013-04-24 ENCOUNTER — Encounter (HOSPITAL_COMMUNITY)
Admission: EM | Disposition: A | Discharge status: Home / Self Care | Payer: Self-pay | Source: Home / Self Care | Attending: Internal Medicine

## 2013-04-24 DIAGNOSIS — D500 Iron deficiency anemia secondary to blood loss (chronic): Secondary | ICD-10-CM | POA: Diagnosis not present

## 2013-04-24 DIAGNOSIS — I10 Essential (primary) hypertension: Secondary | ICD-10-CM | POA: Diagnosis not present

## 2013-04-24 DIAGNOSIS — R0602 Shortness of breath: Secondary | ICD-10-CM | POA: Diagnosis not present

## 2013-04-24 DIAGNOSIS — K2970 Gastritis, unspecified, without bleeding: Secondary | ICD-10-CM

## 2013-04-24 DIAGNOSIS — K2980 Duodenitis without bleeding: Secondary | ICD-10-CM | POA: Diagnosis not present

## 2013-04-24 DIAGNOSIS — K2990 Gastroduodenitis, unspecified, without bleeding: Secondary | ICD-10-CM

## 2013-04-24 DIAGNOSIS — D649 Anemia, unspecified: Secondary | ICD-10-CM | POA: Diagnosis not present

## 2013-04-24 DIAGNOSIS — E119 Type 2 diabetes mellitus without complications: Secondary | ICD-10-CM | POA: Diagnosis not present

## 2013-04-24 HISTORY — PX: ESOPHAGOGASTRODUODENOSCOPY: SHX5428

## 2013-04-24 LAB — TYPE AND SCREEN
ABO/RH(D): O POS
Unit division: 0

## 2013-04-24 LAB — CBC
HCT: 27.5 % — ABNORMAL LOW (ref 39.0–52.0)
Hemoglobin: 9.1 g/dL — ABNORMAL LOW (ref 13.0–17.0)
MCHC: 33.1 g/dL (ref 30.0–36.0)
RBC: 3.69 MIL/uL — ABNORMAL LOW (ref 4.22–5.81)
WBC: 4.6 10*3/uL (ref 4.0–10.5)

## 2013-04-24 LAB — GLUCOSE, CAPILLARY
Glucose-Capillary: 104 mg/dL — ABNORMAL HIGH (ref 70–99)
Glucose-Capillary: 129 mg/dL — ABNORMAL HIGH (ref 70–99)
Glucose-Capillary: 96 mg/dL (ref 70–99)

## 2013-04-24 SURGERY — EGD (ESOPHAGOGASTRODUODENOSCOPY)
Anesthesia: Moderate Sedation

## 2013-04-24 MED ORDER — PANTOPRAZOLE SODIUM 40 MG PO TBEC: 40.0000 mg | DELAYED_RELEASE_TABLET | Freq: Every day | ORAL | Status: DC

## 2013-04-24 MED ORDER — FENTANYL CITRATE 0.05 MG/ML IJ SOLN
INTRAMUSCULAR | Status: DC | PRN
  Administered 2013-04-24 (×2): 25 ug via INTRAVENOUS

## 2013-04-24 MED ORDER — DIPHENHYDRAMINE HCL 50 MG/ML IJ SOLN
INTRAMUSCULAR | Status: AC
  Filled 2013-04-24: qty 1

## 2013-04-24 MED ORDER — MIDAZOLAM HCL 10 MG/2ML IJ SOLN
INTRAMUSCULAR | Status: DC | PRN
  Administered 2013-04-24 (×2): 2 mg via INTRAVENOUS

## 2013-04-24 MED ORDER — BUTAMBEN-TETRACAINE-BENZOCAINE 2-2-14 % EX AERO
INHALATION_SPRAY | CUTANEOUS | Status: DC | PRN
  Administered 2013-04-24: 2 via TOPICAL

## 2013-04-24 MED ORDER — FENTANYL CITRATE 0.05 MG/ML IJ SOLN
INTRAMUSCULAR | Status: AC
  Filled 2013-04-24: qty 2

## 2013-04-24 MED ORDER — MIDAZOLAM HCL 5 MG/ML IJ SOLN
INTRAMUSCULAR | Status: AC
  Filled 2013-04-24: qty 2

## 2013-04-24 MED ORDER — FERROUS SULFATE 325 (65 FE) MG PO TABS: 325.0000 mg | ORAL_TABLET | Freq: Two times a day (BID) | ORAL | Status: DC

## 2013-04-24 MED ORDER — POLYETHYLENE GLYCOL 3350 17 G PO PACK: 17.0000 g | PACK | Freq: Every day | ORAL | Status: DC

## 2013-04-24 MED ORDER — INSULIN ASPART 100 UNIT/ML ~~LOC~~ SOLN: 0.0000 [IU] | Freq: Three times a day (TID) | SUBCUTANEOUS | Status: DC

## 2013-04-24 NOTE — Interval H&P Note (Signed)
History and Physical Interval Note: Anemia, heme + stools, some dark stools.   For EGD today The nature of the procedure, as well as the risks, benefits, and alternatives were carefully and thoroughly reviewed with the patient. Ample time for discussion and questions allowed. The patient understood, was satisfied, and agreed to proceed.     04/24/2013 8:55 AM  [name]  has presented today for surgery, with the diagnosis of anemia.  The various methods of treatment have been discussed with the patient and family. After consideration of risks, benefits and other options for treatment, the patient has consented to  Procedure(s): ESOPHAGOGASTRODUODENOSCOPY (EGD) (N/A) as a surgical intervention .  The patient's history has been reviewed, patient examined, no change in status, stable for surgery.  I have reviewed the patient's chart and labs.  Questions were answered to the patient's satisfaction.     PYRTLE, JAY M

## 2013-04-24 NOTE — H&P (View-Only) (Signed)
                                                                           [facility] Consult: 10:29 AM 04/23/2013  LOS: 1 day    Referring Provider:  Elmahi md Primary Care Physician:  [name],[name], MD Primary Gastroenterologist:  Dr. Jumanah Hynson     Reason for Consultation:  Anemia and dark stools.   HPI: [name] is a 72 y.o. male.  Hx Hep C, underwent anti viral therapy around 2003 at [place].  Ultrasound in 2013 showed just a fatty liver .  S/P 01/2010 screening colonoscopy with internal hemorrhoids.  No EGDs in past.   A few weeks of fatigue, DOE, worse was last week when he had dizziness.  Seen by PMD yest and found to be anemic, Hgb of 6 c/w 16.7 in 05/2009.  Ferritin is 4.  Sent to ED for admission/transfusion. Stools were dark and stuck to sides of commode in last week or so.  No abdominal pain, no anorexia, no weight loss, no edema, no chest pain. Daily baby ASA.  Use of Ibuprofen 200 mg 3 times in the last month.  No etoh.    Now getting his second unit of blood. Also to receive iron dextran infusion.  Never treated with po iron.     Past Medical History  Diagnosis Date  . Hypertension   . Diabetes mellitus without complication   . Hep C w/o coma, chronic   . Hypercholesterolemia     Past Surgical History  Procedure Laterality Date  . Laminectomy  06/2005    Dr Botero. multiple lumbar level laminectomies.   . Knee arthroscopy Left 05/2009    Dr [place]  . 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     Prior to Admission medications   Medication Sig Start Date End Date Taking? Authorizing Provider  amLODipine (NORVASC) 10 MG tablet Take 10 mg by mouth daily.   Yes Historical Provider, MD  aspirin EC 81 MG tablet Take 81 mg by mouth at bedtime.   Yes Historical Provider, MD  atorvastatin (LIPITOR) 20 MG tablet Take 20 mg by mouth  at bedtime.   Yes Historical Provider, MD  ibuprofen (ADVIL,MOTRIN) 200 MG tablet Take 200 mg by mouth every 6 (six) hours as needed for mild pain. For pain   Yes Historical Provider, MD  lisinopril-hydrochlorothiazide (PRINZIDE,ZESTORETIC) 20-25 MG per tablet Take 1 tablet by mouth daily.   Yes Historical Provider, MD  metFORMIN (GLUCOPHAGE) 500 MG tablet Take 1,000-1,500 mg by mouth 2 (two) times daily with a meal. Take 2 tabs in the morning and 3 tabs in the evening   Yes Historical Provider, MD  metoprolol succinate (TOPROL-XL) 25 MG 24 hr tablet Take 25 mg by mouth daily.   Yes Historical Provider, MD    Scheduled Meds: . amLODipine  10 mg Oral Daily  . [START ON 04/24/2013] aspirin EC  81 mg Oral QHS  . atorvastatin  20 mg Oral QHS  . hydrochlorothiazide  25 mg Oral Daily  . insulin aspart  0-5 Units Subcutaneous QHS  . insulin aspart  0-9 Units Subcutaneous Q6H  . lisinopril  20 mg   Oral Daily  . metoprolol succinate  25 mg Oral Daily  . pantoprazole (PROTONIX) IV  40 mg Intravenous Q12H  . sodium chloride  3 mL Intravenous Q12H   Infusions:   PRN Meds: acetaminophen, acetaminophen   Allergies as of 04/22/2013  . (No Known Allergies)    No family hx of anemia Brother may have had stomach cancer.   History   Social History  . Marital Status: Married    Spouse Name: N/A    Number of Children: N/A  . Years of Education: N/A   Occupational History  . Not on file.   Social History Main Topics  . Smoking status: Never Smoker   . Smokeless tobacco: Not on file  . Alcohol Use: none  . Drug Use: Denies hx injection drug use or snorting drugs.   . Sexual Activity: Not on file   Other Topics Concern  . Not on file   Social History Narrative  . No narrative on file    REVIEW OF SYSTEMS: Constitutional:  Stable weight ENT:  No nose bleeds Pulm:  No cough.  + DOE CV:  No palpitations, no LE edema.  GU:  No hematuria, no frequency GI:  Per HPI.  No  dysphagia Heme:  Per HPI   Transfusions:  Around 2009 for anemia without GI bleed Neuro:  No headaches, no peripheral tingling or numbness Psych:  Nightmares and agitation during sleep hours for one month.  No confusion Derm:  No itching, no rash or sores.  Endocrine:  No sweats or chills.  No polyuria or dysuria Immunization:  Flu shot currentl Travel:  None beyond local counties in last few months.    PHYSICAL EXAM: Vital signs in last 24 hours: Filed Vitals:   04/23/13 0924  BP: 137/74  Pulse: 67  Temp: 98.2 F (36.8 C)  Resp: 18   Wt Readings from Last 3 Encounters:  04/23/13 63.3 kg (139 lb 8.8 oz)    General: looks well and comfortable Head:  No asymmetry    Eyes:  No icterus, no pallor Ears:  Not HOH  Nose:  No discharge Mouth:  Clear moist oral mm.  Full denture above, partial below.  Neck:  No jvd, no TMG Lungs:  Clear bil Heart: RRR.  2/6 systolic murmer Abdomen:  Soft, no mass, not tender, active BS.  No HSM, no bruits. .   Rectal: scant stool tests FOB +, too little to determine color of stool   Musc/Skeltl: no joint contracture or swelling Extremities:  No pedal edema  Neurologic:  No tremor. Oriented x 3.  No limb weakness.  Skin:  No AVMs, no rash, no sores Tattoos:  none Nodes:  No cervical adenopathy   Psych:  Pleasant, cooperative.  Relaxed.   Intake/Output from previous day: 12/15 0701 - 12/16 0700 In: 12.5 [Blood:12.5] Out: 800 [Urine:800] Intake/Output this shift: Total I/O In: 12.5 [Blood:12.5] Out: 500 [Urine:500]  LAB RESULTS:  Recent Labs  04/22/13 2123 04/23/13 0600  WBC 3.9* 3.4*  HGB 6.0* 7.0*  HCT 20.1* 22.4*  PLT 200 185   BMET Lab Results  Component Value Date   NA 135 04/23/2013   NA 135 04/22/2013   NA 138 05/12/2009   K 3.6 04/23/2013   K 3.8 04/22/2013   K 4.1 05/12/2009   CL 103 04/23/2013   CL 100 04/22/2013   CO2 19 04/23/2013   CO2 25 04/22/2013   GLUCOSE 87 04/23/2013   GLUCOSE 137* 04/22/2013     GLUCOSE  136* 05/12/2009   BUN 9 04/23/2013   BUN 12 04/22/2013   CREATININE 0.72 04/23/2013   CREATININE 0.72 04/22/2013   CALCIUM 9.1 04/23/2013   CALCIUM 9.5 04/22/2013   LFT  Recent Labs  04/22/13 2214 04/23/13 0600  PROT 9.1* 8.2  ALBUMIN 3.9 3.5  AST 38* 34  ALT 31 28  ALKPHOS 56 46  BILITOT 0.2* 0.4  BILIDIR <0.1  --   IBILI NOT CALCULATED  --    PT/INR Lab Results  Component Value Date   INR 1.09 04/23/2013   INR 1.1 02/28/2007   RADIOLOGY STUDIES: Dg Chest 2 View  04/22/2013   CLINICAL DATA:  Shortness of breath and cough.  EXAM: CHEST  2 VIEW  COMPARISON:  06/19/2005.  FINDINGS: The heart is normal in size and stable. The lungs are clear. No pleural effusion. The bony thorax is intact. Bilateral nipple shadows are noted.  IMPRESSION: No acute cardiopulmonary findings.   Electronically Signed   By: Mark  Gallerani M.D.   On: 04/22/2013 15:12    ENDOSCOPIC STUDIES: 01/2010  Colonoscopy  Dr Shavonte Zhao Routine screening ENDOSCOPIC IMPRESSION:  1) Internal hemorrhoids  2) Otherwise normal examination  RECOMMENDATIONS:  1) Continue current colorectal screening recommendations for  "routine risk" patients with a repeat colonoscopy in 10 years.   IMPRESSION:   *  Anemia, FPB + stool, dark stools.  Rule out ulcers, rule out AVMs, rule out varices or portal hypertenision.   *  Hx Hep C, treated with antiviral therapy at [facility] ~2003/2004  *  Fatty liver on 03/2012 ultrasound.   *  NIDDM    PLAN:     *  EGD.  Tomorrow at 9 AM with Dr Pyrtle.  Po Protonix.  Can eat today.    Sarah Gribbin  04/23/2013, 10:29 AM Pager: [phone]  Chart was reviewed and patient was examined. X-rays and lab were reviewed.    I agree with management and plans.  Subacute GI bleed, Fe deficiency anemia, , probably NSAID- related.  R/o active PUD, AVMs, neoplasm.  Plan to hold NSAIDS, EGD in am.  Cybill Uriegas D. Shakur Lembo, M.D., FACG [facility] Cell [phone]       

## 2013-04-24 NOTE — Discharge Summary (Signed)
Physician Discharge Summary  [name]:[id number] DOB: 1941-05-09 DOA: 04/22/2013  PCP: Darrow Bussing, MD  Admit date: 04/22/2013 Discharge date: 04/24/2013  Time spent: 45  minutes  Recommendations for Outpatient Follow-up: See Dr. Arlyce Dice in one month for biopsy results and to determine if a colonoscopy is needed. CBC, bmet in 1 week. Patient with severe microcytic anemia Follow up with cardiology as previously scheduled  Discharge Diagnoses:  Principal Problem:   Iron deficiency anemia due to chronic blood loss Active Problems:   Shortness of breath   Hypertension   Diabetes mellitus without complication   Unspecified gastritis and gastroduodenitis without mention of hemorrhage   Discharge Condition: Stable, no active bleeding  Diet recommendation: Carb modified  Filed Weights   04/22/13 2351 04/23/13 0500 04/24/13 0515  Weight: 63.368 kg (139 lb 11.2 oz) 63.3 kg (139 lb 8.8 oz) 65.817 kg (145 lb 1.6 oz)    History of present illness:  [name] is a 72 y.o. male with past medical history of hypertension, diabetes, and chronic hep C.  The patient reports that for the last 3-4 weeks he has been having shortness of breath. Since his symptoms were worsening and were associated chest tightness he went to see his PCP, who directed him to the ER.  He does mention he had some dark hard stool. He had history of colonoscopy but denies any history of upper GI endoscopy.   Hospital Course:   Symptomatic Anemia (hgb 6.0)  Chest tightness and palpitations have resolved,Troponin x 1 is wnl. This was likely secondary to anemia. Patient reported 1 week of dark stools, he used to Take Ibuprofen (but not regularly) and a daily 81 mg aspirin. Not on PPI at home.  Received 2 units of blood this admission as well as IV iron. Protonix IV BID as inpatient. [facility], Dr. Rhea Belton performed EGD and found duodenitis.  He was concerned that this was not the primary cause of his anemia.   The patient will return to Dr. Marzetta Board office for biopsy results (H-pylori) and to determine whether or not he would benefit from another colonoscopy. Will discharge on protonix po daily. Since patient does not have CAD-will stop ASA.   Chest tightness and palpitations  Symptoms resolved with blood transfusion, Suspect this was secondary to symptomatic anemia Troponin x 1 wnl Ekg shows LVH  Will follow up with his cardiologist outpatient.  DM  CBGs are well controlled with SSI-S as an inpatient.  On metformin at home.   HTN  Well controlled on home medications.  On Lisinopril/HCTZ, norvasc and metoprolol at home.   Hep C  Appears stable. No current issues  Was taking treatments for Hep C, but they made him too ill to continue.  LFTs WNL.  Procedures:  EGD 04/24/13   Consultations: Dr. Arlyce Dice, [facility]  Discharge Exam: Filed Vitals:   04/24/13 1039  BP: 123/70  Pulse: 65  Temp:   Resp:     General: Thin, but well appearing man, lying comfortably in bed Cardiovascular: rrr, 2/6 systolic murmur, no lower ext edema Respiratory: cta no w/c/r Abdomen:  Soft, thin, nt, nd, +bs Extremities:  5/5 strength in each, no swelling.  Discharge Instructions      Discharge Orders   Future Appointments Provider Department Dept Phone   05/29/2013 9:00 AM Louis Meckel, MD [facility] [phone]   09/03/2013 9:00 AM Donato Schultz, MD [facility] [phone]   Future Orders Complete By Expires  Diet - low sodium heart healthy  As directed    Increase activity slowly  As directed        Medication List    STOP taking these medications       aspirin EC 81 MG tablet     ibuprofen 200 MG tablet  Commonly known as:  ADVIL,MOTRIN      TAKE these medications       amLODipine 10 MG tablet  Commonly known as:  NORVASC  Take 10 mg by mouth daily.     atorvastatin 20 MG tablet  Commonly known as:  LIPITOR  Take 20 mg by mouth  at bedtime.     ferrous sulfate 325 (65 FE) MG tablet  Take 1 tablet (325 mg total) by mouth 2 (two) times daily with a meal.     lisinopril-hydrochlorothiazide 20-25 MG per tablet  Commonly known as:  PRINZIDE,ZESTORETIC  Take 1 tablet by mouth daily.     metFORMIN 500 MG tablet  Commonly known as:  GLUCOPHAGE  Take 1,000-1,500 mg by mouth 2 (two) times daily with a meal. Take 2 tabs in the morning and 3 tabs in the evening     metoprolol succinate 25 MG 24 hr tablet  Commonly known as:  TOPROL-XL  Take 25 mg by mouth daily.     pantoprazole 40 MG tablet  Commonly known as:  PROTONIX  Take 1 tablet (40 mg total) by mouth daily.     polyethylene glycol packet  Commonly known as:  MIRALAX / GLYCOLAX  Take 17 g by mouth daily. In 4 ounces of liquid       No Known Allergies Follow-up Information   Follow up with Darrow Bussing, MD. Schedule an appointment as soon as possible for a visit in 1 week. (Check CBC in 1 week.  )    Specialty:  Family Medicine   Contact information:   [address] [place] [address] [phone]       Follow up with Melvia Heaps, MD. Schedule an appointment as soon as possible for a visit in 1 month. (follow up with gastroenterologist regarding biopsy results)    Specialty:  Gastroenterology   Contact information:   [address] [place] [address] [phone]       Follow up with Darrow Bussing, MD. Schedule an appointment as soon as possible for a visit in 1 week. (cbc check)    Specialty:  Family Medicine   Contact information:   [address] [place] [address] [phone]       Follow up with Melvia Heaps, MD On 05/29/2013. (9 AM)    Specialty:  Gastroenterology   Contact information:   [address] [place] [address] [phone]        The results of significant diagnostics from this hospitalization (including imaging, microbiology, ancillary and laboratory) are  listed below for reference.    Significant Diagnostic Studies: Dg Chest 2 View  04/22/2013   CLINICAL DATA:  Shortness of breath and cough.  EXAM: CHEST  2 VIEW  COMPARISON:  06/19/2005.  FINDINGS: The heart is normal in size and stable. The lungs are clear. No pleural effusion. The bony thorax is intact. Bilateral nipple shadows are noted.  IMPRESSION: No acute cardiopulmonary findings.   Electronically Signed   By: Loralie Champagne M.D.   On: 04/22/2013 15:12      Labs: Basic Metabolic Panel:  Recent Labs Lab 04/22/13 2123 04/23/13 0600  NA 135 135  K 3.8 3.6  CL 100 103  CO2 25 19  GLUCOSE 137* 87  BUN 12 9  CREATININE 0.72 0.72  CALCIUM 9.5 9.1   Liver Function Tests:  Recent Labs Lab 04/22/13 2214 04/23/13 0600  AST 38* 34  ALT 31 28  ALKPHOS 56 46  BILITOT 0.2* 0.4  PROT 9.1* 8.2  ALBUMIN 3.9 3.5   CBC:  Recent Labs Lab 04/22/13 2123 04/23/13 0600 04/24/13 0635  WBC 3.9* 3.4* 4.6  NEUTROABS 1.4*  --   --   HGB 6.0* 7.0* 9.1*  HCT 20.1* 22.4* 27.5*  MCV 71.5* 73.7* 74.5*  PLT 200 185 163   Cardiac Enzymes:  Recent Labs Lab 04/22/13 2123 04/23/13 1626  TROPONINI <0.30 <0.30    CBG:  Recent Labs Lab 04/23/13 1718 04/23/13 2230 04/24/13 0013 04/24/13 0601 04/24/13 1151  GLUCAP 159* 155* 129* 104* 96    Signed:  Evaristo Bury [phone] [facility] 04/24/2013, 1:14 PM  Attending Patient seen and examined, agree with the assessment and plan. Doing well, Hb stable post transfusion, no melanotic stools, EGD shows duodenitis, Cleared by GI for discharge. Will stop ASA-no hx of CAD-c/w PPI. Suspect Chest pain/palpitations on admission was secondary to anemia. Stable for discharge  Windell Norfolk MD

## 2013-04-24 NOTE — Op Note (Signed)
[name] [address] [place], [address]   ENDOSCOPY PROCEDURE REPORT  PATIENT: [name], [name]  MR#: [id number] BIRTHDATE: 08-12-1940 , 72  yrs. old GENDER: Male ENDOSCOPIST: Beverley Fiedler, MD REFERRED BY:  [facility] PROCEDURE DATE:  04/24/2013 PROCEDURE:  EGD w/ biopsy ASA CLASS:     Class III INDICATIONS:  Iron deficiency anemia.   Occult blood positive. MEDICATIONS: These medications were titrated to patient response per physician's verbal order, Propofol (Diprivan), Fentanyl 50 mcg IV, and Versed 4 mg IV TOPICAL ANESTHETIC: Cetacaine Spray  DESCRIPTION OF PROCEDURE: After the risks benefits and alternatives of the procedure were thoroughly explained, informed consent was obtained.  The Pentax adult upper  endoscope was introduced through the mouth and advanced to the second portion of the duodenum. Without limitations.  The instrument was slowly withdrawn as the mucosa was fully examined.  Due to technical issues, images obtained, but not available for the report   ESOPHAGUS: The mucosa of the esophagus appeared normal.  STOMACH: Mild to moderate nodular gastritis (inflammation) was found in the gastric antrum.  There were erosions present.  Multiple biopsies were performed using cold forceps.   The proximal  stomach appeared normal. No blood present.  DUODENUM: Moderate duodenal inflammation was found in the duodenal bulb.   The duodenal mucosa showed no abnormalities in the 2nd part of the duodenum.  No active bleeding.  Retroflexed views revealed no abnormalities.     The scope was then withdrawn from the patient and the procedure completed.  COMPLICATIONS: There were no complications.  ENDOSCOPIC IMPRESSION: 1.   The mucosa of the esophagus appeared normal 2.   Nodular gastritis (inflammation) was found in the gastric antrum; multiple biopsies 3.   The proximal stomach appeared normal 4.   Duodenal inflammation was  found in the duodenal bulb 5.   The duodenal mucosa showed no abnormalities in the 2nd part of the duodenum  RECOMMENDATIONS: 1.  Daily PPI 2.  Await pathology results 3.  Follow-up of helicobacter pylori status, treat if indicated 4.  Oral iron replacement 5.  Office follow-up within 1 month with Dr.  Arlyce Dice to discuss need for repeat colonoscopy.   eSigned:  Beverley Fiedler, MD 04/24/2013 1:09 PM   CC:The Patient

## 2013-04-24 NOTE — Discharge Summary (Signed)
[name] to be D/C'd Home per MD order. Discussed with the patient and all questions fully answered.    Medication List    STOP taking these medications       aspirin EC 81 MG tablet     ibuprofen 200 MG tablet  Commonly known as:  ADVIL,MOTRIN      TAKE these medications       amLODipine 10 MG tablet  Commonly known as:  NORVASC  Take 10 mg by mouth daily.     atorvastatin 20 MG tablet  Commonly known as:  LIPITOR  Take 20 mg by mouth at bedtime.     ferrous sulfate 325 (65 FE) MG tablet  Take 1 tablet (325 mg total) by mouth 2 (two) times daily with a meal.     lisinopril-hydrochlorothiazide 20-25 MG per tablet  Commonly known as:  PRINZIDE,ZESTORETIC  Take 1 tablet by mouth daily.     metFORMIN 500 MG tablet  Commonly known as:  GLUCOPHAGE  Take 1,000-1,500 mg by mouth 2 (two) times daily with a meal. Take 2 tabs in the morning and 3 tabs in the evening     metoprolol succinate 25 MG 24 hr tablet  Commonly known as:  TOPROL-XL  Take 25 mg by mouth daily.     pantoprazole 40 MG tablet  Commonly known as:  PROTONIX  Take 1 tablet (40 mg total) by mouth daily.     polyethylene glycol packet  Commonly known as:  MIRALAX / GLYCOLAX  Take 17 g by mouth daily. In 4 ounces of liquid        VVS, Skin clean, dry and intact without evidence of skin break down, no evidence of skin tears noted.  IV catheter discontinued intact. Site without signs and symptoms of complications. Dressing and pressure applied.  An After Visit Summary was printed and given to the patient.  Patient escorted via WC, and D/C home via private auto.  [name]  04/24/2013 2:16 PM

## 2013-04-25 ENCOUNTER — Encounter (HOSPITAL_COMMUNITY): Payer: Self-pay | Admitting: Internal Medicine

## 2013-05-03 DIAGNOSIS — D500 Iron deficiency anemia secondary to blood loss (chronic): Secondary | ICD-10-CM | POA: Diagnosis not present

## 2013-05-03 DIAGNOSIS — K2981 Duodenitis with bleeding: Secondary | ICD-10-CM | POA: Diagnosis not present

## 2013-05-06 ENCOUNTER — Encounter: Payer: Self-pay | Admitting: Internal Medicine

## 2013-05-29 ENCOUNTER — Ambulatory Visit (INDEPENDENT_AMBULATORY_CARE_PROVIDER_SITE_OTHER): Payer: Medicare Other | Admitting: Gastroenterology

## 2013-05-29 ENCOUNTER — Encounter: Payer: Self-pay | Admitting: Gastroenterology

## 2013-05-29 VITALS — BP 126/72 | HR 86 | Ht 71.0 in | Wt 146.0 lb

## 2013-05-29 DIAGNOSIS — B1920 Unspecified viral hepatitis C without hepatic coma: Secondary | ICD-10-CM | POA: Diagnosis not present

## 2013-05-29 DIAGNOSIS — D500 Iron deficiency anemia secondary to blood loss (chronic): Secondary | ICD-10-CM

## 2013-05-29 DIAGNOSIS — D509 Iron deficiency anemia, unspecified: Secondary | ICD-10-CM | POA: Diagnosis not present

## 2013-05-29 MED ORDER — PEG-KCL-NACL-NASULF-NA ASC-C 100 G PO SOLR: 1.0000 | Freq: Once | ORAL | Status: DC

## 2013-05-29 NOTE — Assessment & Plan Note (Signed)
Etiology for iron deficiency anemia and Hemoccult-positive stool has not been determined.  Recent endoscopy demonstrated nonerosive gastritis and duodenitis.  Possibilities include colonic neoplasm, bleeding polyps and AVMs.    Recommendations #1 colonoscopy; if not diagnostic I will order a capsule endoscopy #2 followup Hemoccults #3 repeat CBC

## 2013-05-29 NOTE — Assessment & Plan Note (Signed)
He was treated over 10 years ago with interferon which he did not tolerate.  With the advent of new therapy will address this problem once workup of iron deficiency anemia is complete.  In the meantime I will check a quantitative HCV RNA

## 2013-05-29 NOTE — Patient Instructions (Signed)
Go to the basement for labs today  Moviprep has been sent to your pharmacy  You have been scheduled for a colonoscopy with propofol. Please follow written instructions given to you at your visit today.  Please pick up your prep kit at the pharmacy within the next 1-3 days. If you use inhalers (even only as needed), please bring them with you on the day of your procedure. Your physician has requested that you go to www.startemmi.com and enter the access code given to you at your visit today. This web site gives a general overview about your procedure. However, you should still follow specific instructions given to you by our office regarding your preparation for the procedure.  Hold Iron 5 days before your Colonoscopy

## 2013-05-29 NOTE — Progress Notes (Signed)
_                                                                                                                History of Present Illness: The patient has returned following hospitalization last month for symptomatic anemia and Hemoccult-positive stools.  You  had a microcytic anemia and was transfused for a hemoglobin of 6.  Ferritin level was 4.  Upper endoscopy demonstrated nonerosive gastritis and duodenitis.  Last colonoscopy in 2011 demonstrated internal hemorrhoids.  He is on no gastric irritants including nonsteroidals.  Since discharge stools were dark (patient is taking iron).  He's had no overt GI bleeding.  He has no specific GI complaints.   Past Medical History  Diagnosis Date  . Hypertension   . Diabetes mellitus without complication   . Hep C w/o coma, chronic   . Hypercholesterolemia    Past Surgical History  Procedure Laterality Date  . Laminectomy  06/2005    Dr Joya Salm. multiple lumbar level laminectomies.   . Knee arthroscopy Left 05/2009    Dr Collier Salina  . 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   . Colonoscopy  01/2010    internal hemorrhoids.  Dr Deatra Ina  . Esophagogastroduodenoscopy N/A 04/24/2013    Procedure: ESOPHAGOGASTRODUODENOSCOPY (EGD);  Surgeon: Jerene Bears, MD;  Location: [facility];  Service: Endoscopy;  Laterality: N/A;   family history is not on file. Current Outpatient Prescriptions  Medication Sig Dispense Refill  . amLODipine (NORVASC) 10 MG tablet Take 10 mg by mouth daily.      Marland Kitchen atorvastatin (LIPITOR) 20 MG tablet Take 20 mg by mouth at bedtime.      . ferrous sulfate 325 (65 FE) MG tablet Take 1 tablet (325 mg total) by mouth 2 (two) times daily with a meal.  60 tablet  3  . lisinopril-hydrochlorothiazide (PRINZIDE,ZESTORETIC) 20-25 MG per tablet Take 1 tablet by mouth daily.      . metFORMIN (GLUCOPHAGE) 500 MG tablet Take 1,000-1,500 mg by mouth 2 (two) times daily with a meal. Take 2 tabs in the  morning and 3 tabs in the evening      . metoprolol succinate (TOPROL-XL) 25 MG 24 hr tablet Take 25 mg by mouth daily.      . pantoprazole (PROTONIX) 40 MG tablet Take 1 tablet (40 mg total) by mouth daily.  30 tablet  6  . polyethylene glycol (MIRALAX / GLYCOLAX) packet Take 17 g by mouth daily. In 4 ounces of liquid  30 each  6   No current facility-administered medications for this visit.   Allergies as of 05/29/2013  . (No Known Allergies)    reports that he has never smoked. He does not have any smokeless tobacco history on file. He reports that he drinks alcohol. His drug history is not on file.     Review of Systems: Pertinent positive and negative review of systems were noted in the above HPI section. All other review of systems were otherwise  negative.  Vital signs were reviewed in today's medical record Physical Exam: General: Well developed , well nourished, no acute distress Skin: anicteric Head: Normocephalic and atraumatic Eyes:  sclerae anicteric, EOMI Ears: Normal auditory acuity Mouth: No deformity or lesions Neck: Supple, no masses or thyromegaly Lungs: Clear throughout to auscultation Heart: Regular rate and rhythm; no rubs or bruits.  There is a soft 0-3/1 early systolic murmur left sternal border Abdomen: Soft, non tender and non distended. No masses, hepatosplenomegaly or hernias noted. Normal Bowel sounds Rectal:deferred Musculoskeletal: Symmetrical with no gross deformities  Skin: No lesions on visible extremities Pulses:  Normal pulses noted Extremities: No clubbing, cyanosis, edema or deformities noted Neurological: Alert oriented x 4, grossly nonfocal Cervical Nodes:  No significant cervical adenopathy Inguinal Nodes: No significant inguinal adenopathy Psychological:  Alert and cooperative. Normal mood and affect  See Assessment and Plan under Problem List

## 2013-05-29 NOTE — Addendum Note (Signed)
Addended by: Oda Kilts on: 05/29/2013 09:43 AM   Modules accepted: Orders

## 2013-06-06 ENCOUNTER — Ambulatory Visit (AMBULATORY_SURGERY_CENTER): Payer: Medicare Other | Admitting: Gastroenterology

## 2013-06-06 ENCOUNTER — Encounter: Payer: Self-pay | Admitting: Gastroenterology

## 2013-06-06 VITALS — BP 168/68 | HR 53 | Temp 96.4°F | Resp 16 | Ht 71.0 in | Wt 146.0 lb

## 2013-06-06 DIAGNOSIS — K648 Other hemorrhoids: Secondary | ICD-10-CM

## 2013-06-06 DIAGNOSIS — D509 Iron deficiency anemia, unspecified: Secondary | ICD-10-CM

## 2013-06-06 DIAGNOSIS — Z1211 Encounter for screening for malignant neoplasm of colon: Secondary | ICD-10-CM | POA: Diagnosis not present

## 2013-06-06 LAB — GLUCOSE, CAPILLARY
Glucose-Capillary: 92 mg/dL (ref 70–99)
Glucose-Capillary: 80 mg/dL (ref 70–99)
Glucose-Capillary: 81 mg/dL (ref 70–99)

## 2013-06-06 MED ORDER — SODIUM CHLORIDE 0.9 % IV SOLN: 500.0000 mL | INTRAVENOUS | Status: DC

## 2013-06-06 NOTE — Progress Notes (Signed)
Blood sugar 81 the patient drinking more juice and has had juice and cracker w/ peanutbutter. Ernestine Conrad checked pt and pt . Asymptomatic. Feels fine . Dr Deatra Ina made aware ,ok to dc pt. Care partner aware not to take diabetic med til am and to have pt eat when get home

## 2013-06-06 NOTE — Patient Instructions (Signed)
YOU HAD AN ENDOSCOPIC PROCEDURE TODAY AT THE [facility]: Refer to the procedure report that was given to you for any specific questions about what was found during the examination.  If the procedure report does not answer your questions, please call your gastroenterologist to clarify.  If you requested that your care partner not be given the details of your procedure findings, then the procedure report has been included in a sealed envelope for you to review at your convenience later.  YOU SHOULD EXPECT: Some feelings of bloating in the abdomen. Passage of more gas than usual.  Walking can help get rid of the air that was put into your GI tract during the procedure and reduce the bloating. If you had a lower endoscopy (such as a colonoscopy or flexible sigmoidoscopy) you may notice spotting of blood in your stool or on the toilet paper. If you underwent a bowel prep for your procedure, then you may not have a normal bowel movement for a few days.  DIET: Your first meal following the procedure should be a light meal and then it is ok to progress to your normal diet.  A half-sandwich or bowl of soup is an example of a good first meal.  Heavy or fried foods are harder to digest and may make you feel nauseous or bloated.  Likewise meals heavy in dairy and vegetables can cause extra gas to form and this can also increase the bloating.  Drink plenty of fluids but you should avoid alcoholic beverages for 24 hours.  ACTIVITY: Your care partner should take you home directly after the procedure.  You should plan to take it easy, moving slowly for the rest of the day.  You can resume normal activity the day after the procedure however you should NOT DRIVE or use heavy machinery for 24 hours (because of the sedation medicines used during the test).    SYMPTOMS TO REPORT IMMEDIATELY: A gastroenterologist can be reached at any hour.  During normal business hours, 8:30 AM to 5:00 PM Monday through Friday,  call [phone].  After hours and on weekends, please call the GI answering service at [phone] who will take a message and have the physician on call contact you.   Following lower endoscopy (colonoscopy or flexible sigmoidoscopy):  Excessive amounts of blood in the stool  Significant tenderness or worsening of abdominal pains  Swelling of the abdomen that is new, acute  Fever of 100F or higher   FOLLOW UP: If any biopsies were taken you will be contacted by phone or by letter within the next 1-3 weeks.  Call your gastroenterologist if you have not heard about the biopsies in 3 weeks.  Our staff will call the home number listed on your records the next business day following your procedure to check on you and address any questions or concerns that you may have at that time regarding the information given to you following your procedure. This is a courtesy call and so if there is no answer at the home number and we have not heard from you through the emergency physician on call, we will assume that you have returned to your regular daily activities without incident.  SIGNATURES/CONFIDENTIALITY: You and/or your care partner have signed paperwork which will be entered into your electronic medical record.  These signatures attest to the fact that that the information above on your After Visit Summary has been reviewed and is understood.  Full responsibility of the confidentiality of   this discharge information lies with you and/or your care-partner.    Information on hemorrhoids given to you today  Capsule Endoscopy will be set up by Dr Kelby Fam office and they will call you with date ,time ,and detalis  Follow up hemoccults in 5-7 days ( cards to check stool for blood given to you today)

## 2013-06-06 NOTE — Progress Notes (Signed)
A/ox3 pleased with MAC, report to Penny RN 

## 2013-06-06 NOTE — Op Note (Addendum)
[place]  [address] [place], [id number]   COLONOSCOPY PROCEDURE REPORT  PATIENT: Daniel, Hoffman  MR#: [id number] BIRTHDATE: [birth year] , 10  yrs. old GENDER: Male ENDOSCOPIST: Inda Castle, MD REFERRED BY: PROCEDURE DATE:  06/06/2013 PROCEDURE:   Colonoscopy, diagnostic First Screening Colonoscopy - Avg.  risk and is 50 yrs.  old or older - No.  Prior Negative Screening - Now for repeat screening. N/A  History of Adenoma - Now for follow-up colonoscopy & has been > or = to 3 yrs.  N/A  Polyps Removed Today? No.  Recommend repeat exam, <10 yrs? No. ASA CLASS:   Class II INDICATIONS:Iron Deficiency Anemia. MEDICATIONS: MAC sedation, administered by CRNA and Propofol (Diprivan) 260 mg IV  DESCRIPTION OF PROCEDURE:   After the risks benefits and alternatives of the procedure were thoroughly explained, informed consent was obtained.  A digital rectal exam revealed no abnormalities of the rectum.   The LB [id number]  endoscope was introduced through the anus and advanced to the cecum, which was identified by both the appendix and ileocecal valve. No adverse events experienced.   The quality of the prep was excellent using Suprep  The instrument was then slowly withdrawn as the colon was fully examined.      COLON FINDINGS: Internal hemorrhoids were found.   The colon was otherwise normal.  There was no diverticulosis, inflammation, polyps or cancers unless previously stated.  Retroflexed views revealed no abnormalities. The time to cecum=4 minutes 36 seconds. Withdrawal time=9 minutes 42 seconds.  The scope was withdrawn and the procedure completed. COMPLICATIONS: There were no complications.  ENDOSCOPIC IMPRESSION: 1.   Internal hemorrhoids 2.   The colon was otherwise normal  RECOMMENDATIONS: 1.  Capsule endoscopy 2.  Followup hemeoccults 5-7 days   eSigned:  Inda Castle, MD 06/06/2013 2:59 PM Revised: 06/06/2013 2:59  PM  cc:   PATIENT NAME:  Daniel, Hoffman MR#: [id number]

## 2013-06-07 ENCOUNTER — Telehealth: Payer: Self-pay | Admitting: *Deleted

## 2013-06-07 NOTE — Telephone Encounter (Signed)
  Follow up Call-  Call back number 06/06/2013  Post procedure Call Back phone  # [phone]  Permission to leave phone message Yes     Patient questions:  Do you have a fever, pain , or abdominal swelling? no Pain Score  0 *  Have you tolerated food without any problems? yes  Have you been able to return to your normal activities? yes  Do you have any questions about your discharge instructions: Diet   no Medications  no Follow up visit  no  Do you have questions or concerns about your Care? no  Actions: * If pain score is 4 or above: No action needed, pain <4.

## 2013-06-13 ENCOUNTER — Other Ambulatory Visit: Payer: Self-pay

## 2013-06-13 DIAGNOSIS — R195 Other fecal abnormalities: Secondary | ICD-10-CM

## 2013-06-21 ENCOUNTER — Ambulatory Visit (INDEPENDENT_AMBULATORY_CARE_PROVIDER_SITE_OTHER): Payer: Self-pay | Admitting: Gastroenterology

## 2013-06-21 DIAGNOSIS — D649 Anemia, unspecified: Secondary | ICD-10-CM

## 2013-06-21 NOTE — Progress Notes (Signed)
Patient here for capsule endo teaching.  All questions answered.  He verbalized understanding of all written and verbal communications

## 2013-06-21 NOTE — Addendum Note (Signed)
Addended by: Marlon Pel on: 06/21/2013 02:05 PM   Modules accepted: Orders

## 2013-06-28 ENCOUNTER — Ambulatory Visit (INDEPENDENT_AMBULATORY_CARE_PROVIDER_SITE_OTHER): Payer: Medicare Other | Admitting: Gastroenterology

## 2013-06-28 DIAGNOSIS — K921 Melena: Secondary | ICD-10-CM | POA: Diagnosis not present

## 2013-06-28 NOTE — Progress Notes (Signed)
Pt in for capsule endoscopy. Pt completed prep last night. Pt swallowed capsule without difficulty. Pt returned as scheduled to have equipment removed, pt tolerated procedure well.  Lot # [id number] Exp:  2016-03

## 2013-07-09 ENCOUNTER — Telehealth: Payer: Self-pay

## 2013-07-09 ENCOUNTER — Encounter: Payer: Self-pay | Admitting: Gastroenterology

## 2013-07-09 NOTE — Telephone Encounter (Signed)
Pt scheduled to see Dr. Deatra Ina 07/19/13@2 :30pm.

## 2013-07-09 NOTE — Progress Notes (Signed)
Patient ID: [name], male   DOB: [birth year], 73 y.o.   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  Plan to refer for double balloon enteroscopy.

## 2013-07-09 NOTE — Telephone Encounter (Signed)
Message copied by Algernon Huxley on Tue Jul 09, 2013  4:47 PM ------      Message from: Erskine Emery D      Created: Tue Jul 09, 2013  4:43 PM       Please schedule office visit to discuss capsule findings ------

## 2013-07-11 ENCOUNTER — Encounter: Payer: Self-pay | Admitting: Gastroenterology

## 2013-07-19 ENCOUNTER — Other Ambulatory Visit (INDEPENDENT_AMBULATORY_CARE_PROVIDER_SITE_OTHER): Payer: Medicare Other

## 2013-07-19 ENCOUNTER — Encounter: Payer: Self-pay | Admitting: Gastroenterology

## 2013-07-19 ENCOUNTER — Ambulatory Visit (INDEPENDENT_AMBULATORY_CARE_PROVIDER_SITE_OTHER): Payer: Medicare Other | Admitting: Gastroenterology

## 2013-07-19 VITALS — BP 140/80 | HR 72 | Ht 71.0 in | Wt 145.0 lb

## 2013-07-19 DIAGNOSIS — D500 Iron deficiency anemia secondary to blood loss (chronic): Secondary | ICD-10-CM

## 2013-07-19 DIAGNOSIS — D649 Anemia, unspecified: Secondary | ICD-10-CM | POA: Diagnosis not present

## 2013-07-19 DIAGNOSIS — B182 Chronic viral hepatitis C: Secondary | ICD-10-CM | POA: Diagnosis not present

## 2013-07-19 LAB — CBC WITH DIFFERENTIAL/PLATELET
BASOS PCT: 0.3 % (ref 0.0–3.0)
Basophils Absolute: 0 10*3/uL (ref 0.0–0.1)
EOS PCT: 1.2 % (ref 0.0–5.0)
Eosinophils Absolute: 0.1 10*3/uL (ref 0.0–0.7)
HEMATOCRIT: 37.7 % — AB (ref 39.0–52.0)
Hemoglobin: 12.4 g/dL — ABNORMAL LOW (ref 13.0–17.0)
Lymphocytes Relative: 25.1 % (ref 12.0–46.0)
Lymphs Abs: 1.1 10*3/uL (ref 0.7–4.0)
MCHC: 32.8 g/dL (ref 30.0–36.0)
MCV: 87.3 fl (ref 78.0–100.0)
MONO ABS: 0.7 10*3/uL (ref 0.1–1.0)
MONOS PCT: 15.7 % — AB (ref 3.0–12.0)
Neutro Abs: 2.6 10*3/uL (ref 1.4–7.7)
Neutrophils Relative %: 57.7 % (ref 43.0–77.0)
PLATELETS: 217 10*3/uL (ref 150.0–400.0)
RBC: 4.32 Mil/uL (ref 4.22–5.81)
RDW: 22.8 % — ABNORMAL HIGH (ref 11.5–14.6)
WBC: 4.6 10*3/uL (ref 4.5–10.5)

## 2013-07-19 NOTE — Patient Instructions (Signed)
Go to the basement for labs today  You will be scheduled for a Double Balloon Enteroscopy at [facility]    We will schedule this appointment with them then contact you with the appointment information

## 2013-07-19 NOTE — Assessment & Plan Note (Signed)
Etiology has not been absolutely determined.  In view of the severity of his anemia (he was hospitalized in December, 2014 for hemoglobin of 6) I will refer him for double balloon enteroscopy.   In the interim we will repeat CBC and continue oral iron supplementation.  CBCs should be checked on a monthly basis.

## 2013-07-19 NOTE — Assessment & Plan Note (Signed)
He was treated over 10 years ago with interferon which he did not tolerate.  With the advent of new therapy will address this problem once workup of iron deficiency anemia is complete.  In the meantime I will check a quantitative HCV RNA

## 2013-07-19 NOTE — Progress Notes (Signed)
Quick Note:  Please inform the patient that lab work was normal and to continue current plan of action ______ 

## 2013-07-19 NOTE — Progress Notes (Signed)
          History of Present Illness:  Patient has returned following capsule endoscopy.  This demonstrated a small polyp in the jejunum and some minimal inflammation in the ileum.  He's had no overt bleeding.  He remains on iron.    Review of Systems: Pertinent positive and negative review of systems were noted in the above HPI section. All other review of systems were otherwise negative.    Current Medications, Allergies, Past Medical History, Past Surgical History, Family History and Social History were reviewed in [place] record  Vital signs were reviewed in today's medical record. Physical Exam: General: Well developed , well nourished, no acute distress   See Assessment and Plan under Problem List

## 2013-07-22 LAB — HEPATITIS C RNA QUANTITATIVE
HCV QUANT LOG: 6.42 {Log} — AB (ref <1.18)
HCV QUANT: 2619279 [IU]/mL — AB (ref <15)

## 2013-07-26 ENCOUNTER — Telehealth: Payer: Self-pay | Admitting: *Deleted

## 2013-07-26 NOTE — Telephone Encounter (Signed)
FAXED RECORDS TO [phone] WAITING ON APPOINTMENT

## 2013-08-01 ENCOUNTER — Other Ambulatory Visit: Payer: Self-pay | Admitting: *Deleted

## 2013-08-01 ENCOUNTER — Other Ambulatory Visit: Payer: Medicare Other

## 2013-08-01 ENCOUNTER — Other Ambulatory Visit (INDEPENDENT_AMBULATORY_CARE_PROVIDER_SITE_OTHER): Payer: Medicare Other

## 2013-08-01 DIAGNOSIS — R195 Other fecal abnormalities: Secondary | ICD-10-CM

## 2013-08-01 DIAGNOSIS — T148 Other injury of unspecified body region: Secondary | ICD-10-CM | POA: Diagnosis not present

## 2013-08-01 DIAGNOSIS — D509 Iron deficiency anemia, unspecified: Secondary | ICD-10-CM

## 2013-08-01 LAB — HEMOCCULT SLIDES (X 3 CARDS)
FECAL OCCULT BLD: NEGATIVE
OCCULT 1: POSITIVE — AB
OCCULT 2: POSITIVE — AB
OCCULT 3: NEGATIVE
OCCULT 4: NEGATIVE
OCCULT 5: NEGATIVE

## 2013-08-02 ENCOUNTER — Encounter: Payer: Self-pay | Admitting: *Deleted

## 2013-08-12 NOTE — Telephone Encounter (Signed)
Ask Amy.  We should be able to burn a CD.

## 2013-08-12 NOTE — Telephone Encounter (Signed)
They want copy of Capsule CD and copy report in color , How do I go about getting disk Dr Deatra Ina, They want a copy before they will schedule his procedure

## 2013-08-12 NOTE — Telephone Encounter (Signed)
[place]  2nd fax. Waiting on appointment

## 2013-08-15 NOTE — Telephone Encounter (Signed)
Pt needs 2 GB Sim card

## 2013-08-29 NOTE — Telephone Encounter (Signed)
Waiting to see if we are going to purchase these jump drives for the patient     Let patient know that we are still waiting on an Appointment

## 2013-08-30 NOTE — Telephone Encounter (Signed)
Mailed Jump Drive today   To: [facility]                                                  [facility] [place] [address]                                                    Att Patrice Jones

## 2013-09-02 NOTE — Telephone Encounter (Signed)
Jump Drive mailed today  Waiting on appointment for patient

## 2013-09-03 ENCOUNTER — Ambulatory Visit (INDEPENDENT_AMBULATORY_CARE_PROVIDER_SITE_OTHER): Payer: Medicare Other | Admitting: Cardiology

## 2013-09-03 ENCOUNTER — Encounter: Payer: Self-pay | Admitting: Cardiology

## 2013-09-03 VITALS — BP 148/77 | HR 56 | Ht 71.0 in | Wt 141.0 lb

## 2013-09-03 DIAGNOSIS — I359 Nonrheumatic aortic valve disorder, unspecified: Secondary | ICD-10-CM

## 2013-09-03 DIAGNOSIS — I350 Nonrheumatic aortic (valve) stenosis: Secondary | ICD-10-CM

## 2013-09-03 DIAGNOSIS — R634 Abnormal weight loss: Secondary | ICD-10-CM

## 2013-09-03 DIAGNOSIS — E785 Hyperlipidemia, unspecified: Secondary | ICD-10-CM

## 2013-09-03 DIAGNOSIS — B1920 Unspecified viral hepatitis C without hepatic coma: Secondary | ICD-10-CM | POA: Diagnosis not present

## 2013-09-03 DIAGNOSIS — I10 Essential (primary) hypertension: Secondary | ICD-10-CM | POA: Diagnosis not present

## 2013-09-03 DIAGNOSIS — I2510 Atherosclerotic heart disease of native coronary artery without angina pectoris: Secondary | ICD-10-CM | POA: Diagnosis not present

## 2013-09-03 DIAGNOSIS — E1169 Type 2 diabetes mellitus with other specified complication: Secondary | ICD-10-CM | POA: Insufficient documentation

## 2013-09-03 NOTE — Patient Instructions (Signed)
Your physician recommends that you continue on your current medications as directed. Please refer to the Current Medication list given to you today.  Your physician wants you to follow-up in: 6 months with Dr. Marlou Porch. You will receive a reminder letter in the mail two months in advance. If you don't receive a letter, please call our office to schedule the follow-up appointment.

## 2013-09-03 NOTE — Progress Notes (Signed)
[place]. [address], [address] [place], [place]  [address] Phone: [phone] Fax:  [phone]  Date:  09/03/2013   ID:  [name], DOB 08/31/1940, MRN [id number]  PCP:  Lujean Amel, MD   History of Present Illness: [name] is a 73 y.o. male with severe aortic stenosis on echocardiogram 4/14, hepatitis C, moderate non-flow-limiting coronary artery disease on catheterization in 2010 here for followup.  Interestingly in 2010, no significant evidence of aortic stenosis on cardiac catheterization , obliteration of LV cavity during contraction due to hyperdynamic nature. However, echocardiogram 09/04/13 demonstrates severe aortic stenosis: 1. There is moderate concentric left ventricle hypertrophy. 2. Left ventricular ejection fraction estimated by 2D at 60-65 percent. 3. There were no regional wall motion abnormalities. 4. Mild left atrial enlargement. 5. Mild mitral annular calcification. 6. Mildly thickened mitral valve. 7. Trace mitral valve regurgitation. 8. Trivial tricuspid regurgitation. 9. Normal estimated right ventricular systolic pressure. 10. Analysis of mitral valve inflow, pulmonary vein Doppler and tissue Doppler suggests grade Ia diastolic dysfunction with elevated left atrial pressure. 11. Aortic stenosis has mildly progressed since last year evaluation (3.25m/s peak, now 4.84m/s).  Hepatitis C. 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 Erectile dysfunction noted.  Moderate coronary artery disease, no flow limitation with 50% RCA, a 30-40% circumflex, 50% second diagonal.  Had a hospitalization in December of 2014 secondary to anemia. Colonoscopy performed. Had transfusion. EGD showed duodenitis. He did have chest discomfort which was thought to be secondary to anemia hemoglobin 6. Had capsule endoscopy without any conclusive evidence of bleeding. Went to [facility] in referral. GI notes reviewed.  Loosing weight. Trying to eat  well.    Wt Readings from Last 3 Encounters:  09/03/13 141 lb (63.957 kg)  07/19/13 145 lb (65.772 kg)  06/06/13 146 lb (66.225 kg)     Past Medical History  Diagnosis Date  . Essential hypertension, benign   . Diabetes mellitus without complication   . Hep C w/o coma, chronic   . Hypercholesterolemia   . ED (erectile dysfunction)   . History of DVT of lower extremity   . Acute epididymo-orchitis   . Aortic valve disorders     Past Surgical History  Procedure Laterality Date  . Laminectomy  06/2005    Dr Joya Salm. multiple lumbar level laminectomies.   . Knee arthroscopy Left 05/2009    Dr Collier Salina  . 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   . Colonoscopy  01/2010    internal hemorrhoids.  Dr Deatra Ina  . Esophagogastroduodenoscopy N/A 04/24/2013    Procedure: ESOPHAGOGASTRODUODENOSCOPY (EGD);  Surgeon: Jerene Bears, MD;  Location: [facility] ENDOSCOPY;  Service: Endoscopy;  Laterality: N/A;  . Bunionectomy Bilateral 2008  . Inguinal hernia repair Right     Current Outpatient Prescriptions  Medication Sig Dispense Refill  . amLODipine (NORVASC) 10 MG tablet Take 10 mg by mouth daily.      Marland Kitchen atorvastatin (LIPITOR) 20 MG tablet Take 20 mg by mouth at bedtime.      . ferrous sulfate 325 (65 FE) MG tablet Take 1 tablet (325 mg total) by mouth 2 (two) times daily with a meal.  60 tablet  3  . lisinopril-hydrochlorothiazide (PRINZIDE,ZESTORETIC) 20-25 MG per tablet Take 1 tablet by mouth daily.      . metFORMIN (GLUCOPHAGE) 500 MG tablet Take 1,000-1,500 mg by mouth 2 (two) times daily with a meal. Take 2 tabs in  the morning and 3 tabs in the evening      . metoprolol succinate (TOPROL-XL) 25 MG 24 hr tablet Take 25 mg by mouth daily.       No current facility-administered medications for this visit.    Allergies:    Allergies  Allergen Reactions  . Crestor [Rosuvastatin]     Discomfort   . Vytorin [Ezetimibe-Simvastatin]     Leg pains     Social  History:  The patient  reports that he has never smoked. He has never used smokeless tobacco. He reports that he drinks alcohol. He reports that he does not use illicit drugs.   ROS:  Please see the history of present illness.   He denies any fevers, chills, orthopnea, PND, syncope, chest pain, significant shortness of breath. Has had issues with iron deficiency anemia, possible source of bleeding.  PHYSICAL EXAM: VS:  BP 148/77  Pulse 56  Ht 5\' 11"  (1.803 m)  Wt 141 lb (63.957 kg)  BMI 19.67 kg/m2 Thin in no acute distress HEENT: normal Neck: no JVD, carotid bruits bilaterally from heart murmur. Delayed carotid upstroke. Cardiac:  normal S1, S2; RRR; 3/6 systolic crescendo decrescendo murmurright upper sternal border Lungs:  clear to auscultation bilaterally, no wheezing, rhonchi or rales Abd: soft, nontender, no hepatomegaly Ext: no edema Skin: warm and dry Neuro: no focal abnormalities noted  EKG:  None today    Labs: LDL 75 on 5/13.  ASSESSMENT AND PLAN:  1. Severe aortic stenosis-echocardiogram on 09/03/12 demonstrated severe aortic stenosis with peak velocity of 4.0 m/s. Currently asymptomatic. Repeat echocardiogram. Appt on Thursday.  2. Hypertension-currently well controlled. 3. Hyperlipidemia-atorvastatin. LDL 75 4. Anemia, iron deficiency, GI workup here with capsule, endoscopy did not show any overt signs of bleeding. He is going to [facility] for further evaluation. He states he has not had any bleeding episodes recently. I wonder if there could be a connection to aortic stenosis, Heyde's syndrome where aortic stenosis can cause degradation of von Willebrand's factor leading to increased rate of bleeding. Occasionally when heart valve is corrected in this situation, bleeding disorder is improved. He does not have any frequent nosebleeds or spontaneous bruising.  Signed, Candee Furbish, MD [facility]  09/03/2013 9:02 AM

## 2013-09-05 ENCOUNTER — Other Ambulatory Visit (HOSPITAL_COMMUNITY): Payer: Self-pay | Admitting: Cardiology

## 2013-09-05 ENCOUNTER — Ambulatory Visit (HOSPITAL_COMMUNITY): Payer: Medicare Other | Attending: Cardiovascular Disease | Admitting: Radiology

## 2013-09-05 DIAGNOSIS — I359 Nonrheumatic aortic valve disorder, unspecified: Secondary | ICD-10-CM | POA: Insufficient documentation

## 2013-09-05 NOTE — Progress Notes (Signed)
Echocardiogram performed.  

## 2013-09-06 NOTE — Telephone Encounter (Signed)
APPOINTMENT SCHEDULED ON 11/06/2013 AT 9AM   cALLED PT TO INFORM   L/M

## 2013-09-13 ENCOUNTER — Encounter: Payer: Self-pay | Admitting: Cardiology

## 2013-09-13 ENCOUNTER — Ambulatory Visit (INDEPENDENT_AMBULATORY_CARE_PROVIDER_SITE_OTHER): Payer: Medicare Other | Admitting: Cardiology

## 2013-09-13 VITALS — BP 144/68 | HR 60 | Ht 71.0 in | Wt 140.0 lb

## 2013-09-13 DIAGNOSIS — I2510 Atherosclerotic heart disease of native coronary artery without angina pectoris: Secondary | ICD-10-CM

## 2013-09-13 DIAGNOSIS — I10 Essential (primary) hypertension: Secondary | ICD-10-CM

## 2013-09-13 DIAGNOSIS — I359 Nonrheumatic aortic valve disorder, unspecified: Secondary | ICD-10-CM

## 2013-09-13 DIAGNOSIS — I350 Nonrheumatic aortic (valve) stenosis: Secondary | ICD-10-CM

## 2013-09-13 NOTE — Progress Notes (Signed)
[place]. [address] [place], [place]  [address] Phone: [phone] Fax:  [phone]  Date:  09/13/2013   ID:  KAINALU HEGGS, DOB Jun 28, 1940, MRN [id number]  PCP:  Lujean Amel, MD   History of Present Illness: [name] is a 73 y.o. male with severe aortic stenosis on echocardiogram 4/14, hepatitis C, moderate non-flow-limiting coronary artery disease on catheterization in 2010 here for followup.  Interestingly in 2010, no significant evidence of aortic stenosis on cardiac catheterization , obliteration of LV cavity during contraction due to hyperdynamic nature. However, echocardiogram 09/04/13 demonstrates severe aortic stenosis:  Hepatitis C. 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 Erectile dysfunction noted.  Moderate coronary artery disease, no flow limitation with 50% RCA, a 30-40% circumflex, 50% second diagonal.  Had a hospitalization in December of 2014 secondary to anemia. Colonoscopy performed. Had transfusion. EGD showed duodenitis. He did have chest discomfort which was thought to be secondary to anemia hemoglobin 6. Had capsule endoscopy without any conclusive evidence of bleeding. Went to [facility] in referral. GI notes reviewed.  Loosing weight. Trying to eat well.   ECHO: 09/05/13: - Left ventricle: The cavity size was normal. There was severe concentric hypertrophy. Systolic function was normal. The estimated ejection fraction was in the range of 60% to 65%. Wall motion was normal; there were no regional wall motion abnormalities. Features are consistent with a pseudonormal left ventricular filling pattern, with concomitant abnormal relaxation and increased filling pressure (grade 2 diastolic dysfunction). - Aortic valve: There was severe stenosis. Peak 5.60m/s, 16mmHg mean Mild regurgitation. - Left atrium: The atrium was moderately to severely dilated. - Atrial septum: No defect or patent foramen ovale  was identified.    Wt Readings from Last 3 Encounters:  09/13/13 140 lb (63.504 kg)  09/03/13 141 lb (63.957 kg)  07/19/13 145 lb (65.772 kg)     Past Medical History  Diagnosis Date  . Essential hypertension, benign   . Diabetes mellitus without complication   . Hep C w/o coma, chronic   . Hypercholesterolemia   . ED (erectile dysfunction)   . History of DVT of lower extremity   . Acute epididymo-orchitis   . Aortic valve disorders     Past Surgical History  Procedure Laterality Date  . Laminectomy  06/2005    Dr Joya Salm. multiple lumbar level laminectomies.   . Knee arthroscopy Left 05/2009    Dr Collier Salina  . 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   . Colonoscopy  01/2010    internal hemorrhoids.  Dr Deatra Ina  . Esophagogastroduodenoscopy N/A 04/24/2013    Procedure: ESOPHAGOGASTRODUODENOSCOPY (EGD);  Surgeon: Jerene Bears, MD;  Location: [facility] ENDOSCOPY;  Service: Endoscopy;  Laterality: N/A;  . Bunionectomy Bilateral 2008  . Inguinal hernia repair Right     Current Outpatient Prescriptions  Medication Sig Dispense Refill  . amLODipine (NORVASC) 10 MG tablet Take 10 mg by mouth daily.      Marland Kitchen atorvastatin (LIPITOR) 20 MG tablet Take 20 mg by mouth at bedtime.      . ferrous sulfate 325 (65 FE) MG tablet Take 1 tablet (325 mg total) by mouth 2 (two) times daily with a meal.  60 tablet  3  . lisinopril-hydrochlorothiazide (PRINZIDE,ZESTORETIC) 20-25 MG per tablet Take 1 tablet by mouth daily.      . metFORMIN (GLUCOPHAGE) 500 MG tablet Take 1,000-1,500 mg by mouth 2 (two) times daily with  a meal. Take 2 tabs in the morning and 3 tabs in the evening      . metoprolol succinate (TOPROL-XL) 25 MG 24 hr tablet Take 25 mg by mouth daily.       No current facility-administered medications for this visit.    Allergies:    Allergies  Allergen Reactions  . Crestor [Rosuvastatin]     Discomfort   . Vytorin [Ezetimibe-Simvastatin]     Leg pains      Social History:  The patient  reports that he has never smoked. He has never used smokeless tobacco. He reports that he drinks alcohol. He reports that he does not use illicit drugs.   ROS:  Please see the history of present illness.   He denies any fevers, chills, orthopnea, PND, syncope, chest pain, significant shortness of breath. Has had issues with iron deficiency anemia, possible source of bleeding.  PHYSICAL EXAM: VS:  BP 144/68  Pulse 60  Ht 5\' 11"  (1.803 m)  Wt 140 lb (63.504 kg)  BMI 19.53 kg/m2 Thin in no acute distress HEENT: normal Neck: no JVD, carotid bruits bilaterally from heart murmur. Delayed carotid upstroke. Cardiac:  normal S1, S2; RRR; 3/6 systolic crescendo decrescendo murmurright upper sternal border Lungs:  clear to auscultation bilaterally, no wheezing, rhonchi or rales Abd: soft, nontender, no hepatomegaly Ext: no edema Skin: warm and dry Neuro: no focal abnormalities noted  EKG:  None today    Labs: LDL 75 on 5/13.  ASSESSMENT AND PLAN:  1. Severe aortic stenosis-echocardiogram on 09/03/12 demonstrated severe aortic stenosis with peak velocity of 4.0 m/s. Now peak velocity is 5.0 m/s with a mean gradient of 61 mm mercury. It has advanced. Currently denies symptoms of shortness of breath, angina, syncope. I will have him referred to cardiothoracic surgery for further discussion of aortic valve replacement. Last catheterization was in 2010 with moderate CAD. He will need repeat cardiac catheterization if we decide to proceed with surgery. 2. Hypertension-currently well controlled. 3. Hyperlipidemia-atorvastatin. LDL 75 4. Anemia, iron deficiency, GI workup here with capsule, endoscopy did not show any overt signs of bleeding. He is going to [facility] for further evaluation (July). He states he has not had any bleeding episodes recently. I wonder if there could be a connection to aortic stenosis, Heyde's syndrome where aortic stenosis can cause degradation  of von Willebrand's factor leading to increased rate of bleeding. Occasionally when heart valve is corrected in this situation, bleeding disorder is improved. He does not have any frequent nosebleeds or spontaneous bruising.  Signed, Candee Furbish, MD [facility]  09/13/2013 12:29 PM

## 2013-09-13 NOTE — Patient Instructions (Signed)
Your physician recommends that you continue on your current medications as directed. Please refer to the Current Medication list given to you today.  You have been referred to Cardiothoracic Surgery  Your physician wants you to follow-up in: 6 months with Dr. Marlou Porch. You will receive a reminder letter in the mail two months in advance. If you don't receive a letter, please call our office to schedule the follow-up appointment.

## 2013-09-24 DIAGNOSIS — E119 Type 2 diabetes mellitus without complications: Secondary | ICD-10-CM | POA: Diagnosis not present

## 2013-09-24 DIAGNOSIS — Z125 Encounter for screening for malignant neoplasm of prostate: Secondary | ICD-10-CM | POA: Diagnosis not present

## 2013-09-24 DIAGNOSIS — Z0000 Encounter for general adult medical examination without abnormal findings: Secondary | ICD-10-CM | POA: Diagnosis not present

## 2013-09-24 DIAGNOSIS — I10 Essential (primary) hypertension: Secondary | ICD-10-CM | POA: Diagnosis not present

## 2013-09-24 DIAGNOSIS — E785 Hyperlipidemia, unspecified: Secondary | ICD-10-CM | POA: Diagnosis not present

## 2013-09-24 DIAGNOSIS — R634 Abnormal weight loss: Secondary | ICD-10-CM | POA: Diagnosis not present

## 2013-09-24 DIAGNOSIS — B1920 Unspecified viral hepatitis C without hepatic coma: Secondary | ICD-10-CM | POA: Diagnosis not present

## 2013-09-24 DIAGNOSIS — I359 Nonrheumatic aortic valve disorder, unspecified: Secondary | ICD-10-CM | POA: Diagnosis not present

## 2013-09-24 DIAGNOSIS — Z23 Encounter for immunization: Secondary | ICD-10-CM | POA: Diagnosis not present

## 2013-09-25 ENCOUNTER — Institutional Professional Consult (permissible substitution) (INDEPENDENT_AMBULATORY_CARE_PROVIDER_SITE_OTHER): Payer: Medicare Other | Admitting: Surgery

## 2013-09-25 ENCOUNTER — Encounter: Payer: Self-pay | Admitting: Surgery

## 2013-09-25 VITALS — BP 146/81 | HR 79 | Resp 16 | Ht 70.5 in | Wt 142.0 lb

## 2013-09-25 DIAGNOSIS — I2510 Atherosclerotic heart disease of native coronary artery without angina pectoris: Secondary | ICD-10-CM

## 2013-09-25 DIAGNOSIS — I359 Nonrheumatic aortic valve disorder, unspecified: Secondary | ICD-10-CM | POA: Diagnosis not present

## 2013-09-25 DIAGNOSIS — I350 Nonrheumatic aortic (valve) stenosis: Secondary | ICD-10-CM

## 2013-09-26 ENCOUNTER — Encounter: Payer: Self-pay | Admitting: Surgery

## 2013-09-26 NOTE — Progress Notes (Signed)
PCP is Lujean Amel, MD Referring Provider is Candee Furbish, MD  Chief Complaint  Patient presents with  . Aortic Stenosis    eval for surgery...ECHO..09/05/13....will need cath before surgery    HPI:  The patient is a 73 year old gentleman with a history of diabetes, hypertension, and hepatitis C s/p antiviral treatment in 2003 at [facility] who underwent catheterization in June 2010 after an abnormal nuclear stress test. He had been having some vague chest discomfort. The cath showed mild, nonobstructive CAD with and EF of 75% and a hyperdynamic LV. There was no gradient across the aortic valve. His echo at that time had shown a mean gradient of 25 mm Hg across the aortic valve. A follow up echo in 08/2012 showed severe AS with a peak velocity of 3.7 m/sec. A follow up echo on 09/05/2013 showed severe AS with a mean gradient of 61 mm Hg and a peak gradient of 100 mm Hg. The AVA was 0.86 cm2. The aorta was normal sized. He currently denies any symptoms of dyspnea, fatigue, chest pain or dizziness but says he is not very active due to chronic left shoulder pain.  Past Medical History  Diagnosis Date  . Essential hypertension, benign   . Diabetes mellitus without complication   . Hep C w/o coma, chronic   . Hypercholesterolemia   . ED (erectile dysfunction)   . History of DVT of lower extremity   . Acute epididymo-orchitis   . Aortic valve disorders     Past Surgical History  Procedure Laterality Date  . Laminectomy  06/2005    Dr Joya Salm. multiple lumbar level laminectomies.   . Knee arthroscopy Left 05/2009    Dr Collier Salina  . 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   . Colonoscopy  01/2010    internal hemorrhoids.  Dr Deatra Ina  . Esophagogastroduodenoscopy N/A 04/24/2013    Procedure: ESOPHAGOGASTRODUODENOSCOPY (EGD);  Surgeon: Jerene Bears, MD;  Location: [facility] ENDOSCOPY;  Service: Endoscopy;  Laterality: N/A;  . Bunionectomy Bilateral 2008  . Inguinal hernia repair Right      Family History  Problem Relation Age of Onset  . Diabetes Father   . Stroke Father   . Diabetes Mother   . Stroke Mother   . Hypertension      family history    Social History History  Substance Use Topics  . Smoking status: Former Smoker    Types: Cigarettes    Quit date: 09/26/1986  . Smokeless tobacco: Never Used  . Alcohol Use: Yes    Current Outpatient Prescriptions  Medication Sig Dispense Refill  . amLODipine (NORVASC) 10 MG tablet Take 10 mg by mouth daily.      Marland Kitchen atorvastatin (LIPITOR) 20 MG tablet Take 20 mg by mouth at bedtime.      . ferrous sulfate 325 (65 FE) MG tablet Take 1 tablet (325 mg total) by mouth 2 (two) times daily with a meal.  60 tablet  3  . lisinopril-hydrochlorothiazide (PRINZIDE,ZESTORETIC) 20-25 MG per tablet Take 1 tablet by mouth daily.      . metFORMIN (GLUCOPHAGE) 500 MG tablet Take 1,000-1,500 mg by mouth 2 (two) times daily with a meal. Take 2 tabs in the morning and 3 tabs in the evening      . metoprolol succinate (TOPROL-XL) 25 MG 24 hr tablet Take 25 mg by mouth daily.      . pantoprazole (PROTONIX) 40 MG tablet Take 40 mg by mouth daily.  No current facility-administered medications for this visit.    Allergies  Allergen Reactions  . Crestor [Rosuvastatin]     Discomfort, RESTLESS, CAN'T SLEEP   . Vytorin [Ezetimibe-Simvastatin]     Leg pains     Review of Systems  Constitutional: Negative for fever, activity change, appetite change, fatigue and unexpected weight change.  HENT: Negative.  Negative for dental problem.        Sees his dentist regularly  Eyes: Negative.   Respiratory: Negative for shortness of breath.   Cardiovascular: Negative for chest pain, palpitations and leg swelling.  Gastrointestinal: Positive for blood in stool. Negative for nausea, vomiting, abdominal pain, diarrhea, constipation, abdominal distention, anal bleeding and rectal pain.  Endocrine: Negative.   Genitourinary: Negative.    Musculoskeletal: Positive for arthralgias.       Particularly left shoulder  Skin: Negative.   Allergic/Immunologic: Negative.   Neurological: Negative for dizziness and syncope.  Hematological: Negative.   Psychiatric/Behavioral: Negative.     BP 146/81  Pulse 79  Resp 16  Ht 5' 10.5" (1.791 m)  Wt 142 lb (64.411 kg)  BMI 20.08 kg/m2  SpO2 99% Physical Exam  Constitutional: He is oriented to person, place, and time. He appears well-developed and well-nourished. No distress.  HENT:  Head: Normocephalic and atraumatic.  Mouth/Throat: Oropharynx is clear and moist.  Eyes: Conjunctivae and EOM are normal. Pupils are equal, round, and reactive to light.  Neck: Normal range of motion. Neck supple. No tracheal deviation present. No thyromegaly present.  Cardiovascular: Normal rate and regular rhythm.   Murmur heard. 3/6 harsh systolic murmur along RSB radiating into neck bilaterally.  Pulmonary/Chest: Effort normal and breath sounds normal. No respiratory distress. He has no wheezes. He has no rales. He exhibits no tenderness.  Abdominal: Soft. Bowel sounds are normal. He exhibits no distension and no mass. There is no tenderness.  Musculoskeletal: Normal range of motion. He exhibits no edema.  Lymphadenopathy:    He has no cervical adenopathy.  Neurological: He is alert and oriented to person, place, and time. He has normal strength. No cranial nerve deficit or sensory deficit.  Skin: Skin is warm and dry.  Psychiatric: He has a normal mood and affect.     Diagnostic Tests:  [facility] [address], [place] [address] [phone]  ------------------------------------------------------------ Transthoracic Echocardiography  Patient: [name], [name] MR #: [id number] Study Date: 09/05/2013 Gender: M Age: 23 Height: 180.3cm Weight: 64kg BSA: 1.58m^2 Pt. Status: Room:  ORDERING Candee Furbish REFERRING Candee Furbish SONOGRAPHER Cindy Hazy,  RDCS ATTENDING Croitoru, Mihai PERFORMING [facility], Outpatient cc:  ------------------------------------------------------------ LV EF: 60% - 65%  ------------------------------------------------------------ Indications: 424.1 Aortic valve disorders.  ------------------------------------------------------------ History: PMH: Acquired from the patient and from the patient's chart. PMH: CAD. Shortness of Breath. Aortic Stenosis. Risk factors: Hypertension. Dyslipidemia.  ------------------------------------------------------------ Study Conclusions  - Left ventricle: The cavity size was normal. There was severe concentric hypertrophy. Systolic function was normal. The estimated ejection fraction was in the range of 60% to 65%. Wall motion was normal; there were no regional wall motion abnormalities. Features are consistent with a pseudonormal left ventricular filling pattern, with concomitant abnormal relaxation and increased filling pressure (grade 2 diastolic dysfunction). - Aortic valve: There was severe stenosis. Mild regurgitation. - Left atrium: The atrium was moderately to severely dilated. - Atrial septum: No defect or patent foramen ovale was identified. Transthoracic echocardiography. M-mode, complete 2D, spectral Doppler, and color Doppler. Height: Height: 180.3cm. Height: 71in. Weight: Weight: 64kg.  Weight: 140.7lb. Body mass index: BMI: 19.7kg/m^2. Body surface area: BSA: 1.9m^2. Blood pressure: 148/77. Patient status: Outpatient. Location: [facility]  ------------------------------------------------------------  ------------------------------------------------------------ Left ventricle: The cavity size was normal. There was severe concentric hypertrophy. Systolic function was normal. The estimated ejection fraction was in the range of 60% to 65%. Wall motion was normal; there were no regional wall motion abnormalities. Features are consistent with  a pseudonormal left ventricular filling pattern, with concomitant abnormal relaxation and increased filling pressure (grade 2 diastolic dysfunction).  ------------------------------------------------------------ Aortic valve: Severely thickened, severely calcified leaflets. Doppler: There was severe stenosis. Mild regurgitation. VTI ratio of LVOT to aortic valve: 0.25. Valve area: 0.86cm^2(VTI). Indexed valve area: 0.47cm^2/m^2 (VTI). Peak velocity ratio of LVOT to aortic valve: 0.22. Valve area: 0.78cm^2 (Vmax). Indexed valve area: 0.43cm^2/m^2 (Vmax). Mean gradient: 47mm Hg (S). Peak gradient: 156mm Hg (S).  ------------------------------------------------------------ Aorta: Aortic root: The aortic root was normal in size. Ascending aorta: The ascending aorta was normal in size.  ------------------------------------------------------------ Mitral valve: Mildly thickened leaflets . Doppler: No significant regurgitation. Peak gradient: 83mm Hg (D).  ------------------------------------------------------------ Left atrium: The atrium was moderately to severely dilated.  ------------------------------------------------------------ Atrial septum: No defect or patent foramen ovale was identified.  ------------------------------------------------------------ Right ventricle: The cavity size was normal. Wall thickness was normal. Systolic function was normal.  ------------------------------------------------------------ Pulmonic valve: Poorly visualized. Doppler: No significant regurgitation.  ------------------------------------------------------------ Tricuspid valve: Structurally normal valve. Leaflet separation was normal. Doppler: Transvalvular velocity was within the normal range. Trivial regurgitation.  ------------------------------------------------------------ Pulmonary artery: Systolic pressure was within the  normal range.  ------------------------------------------------------------ Right atrium: The atrium was normal in size.  ------------------------------------------------------------ Pericardium: There was no pericardial effusion.  ------------------------------------------------------------ Systemic veins: Inferior vena cava: The vessel was dilated; the respirophasic diameter changes were blunted (< 50%); findings are consistent with elevated central venous pressure.  ------------------------------------------------------------  2D measurements Normal Doppler measurements Normal Left ventricle Main pulmonary LVID ED, 34.9 mm 43-52 artery chord, Pressure, 21 mm Hg =30 PLAX S LVID ES, 24.1 mm 23-38 Left ventricle chord, Ea, lat 6.8 cm/s ------ PLAX ann, tiss FS, chord, 31 % >29 DP PLAX E/Ea, lat 17.0 ------ LVPW, ED 19.6 mm ------ ann, tiss 6 IVS/LVPW 0.94 <1.3 DP ratio, ED Ea, med 6.25 cm/s ------ Ventricular septum ann, tiss IVS, ED 18.5 mm ------ DP LVOT E/Ea, med 18.5 ------ Diam, S 21 mm ------ ann, tiss 6 Area 3.46 cm^2 ------ DP Diam 21 mm ------ LVOT Aorta Peak vel, 112 cm/s ------ Root diam, 33 mm ------ S ED VTI, S 32.2 cm ------ Left atrium Peak 5 mm Hg ------ AP dim 50 mm ------ gradient, AP dim 2.75 cm/m^2 <2.2 S index Stroke vol 111. ml ------ 5 Stroke 61.3 ml/m^2 ------ index Aortic valve Peak vel, 500 cm/s ------ S Mean vel, 361 cm/s ------ S VTI, S 129 cm ------ Mean 61 mm Hg ------ gradient, S Peak 100 mm Hg ------ gradient, S VTI ratio 0.25 ------ LVOT/AV Area, VTI 0.86 cm^2 ------ Area index 0.47 cm^2/m ------ (VTI) ^2 Peak vel 0.22 ------ ratio, LVOT/AV Area, Vmax 0.78 cm^2 ------ Area index 0.43 cm^2/m ------ (Vmax) ^2 Regurg PHT 527 ms ------ Mitral valve Peak E vel 116 cm/s ------ Peak A vel 117 cm/s ------ Decelerati 275 ms 150-23 on time 0 Peak 5 mm Hg ------ gradient, D Peak E/A 1 ------ ratio Tricuspid  valve Regurg 215 cm/s ------ peak vel Peak RV-RA 18 mm Hg ------ gradient, S Systemic veins Estimated 3 mm Hg ------  CVP Right ventricle Pressure, 21 mm Hg <30 S Sa vel, 16.4 cm/s ------ lat ann, tiss DP  ------------------------------------------------------------ Prepared and Electronically Authenticated by  Croitoru, Mihai 2015-04-30T15:[id number]   Impression:  He has critical aortic stenosis with a mean gradient of 61 mm Hg, 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 His aortic stenosis has progressed significantly from last year with a peak velocity of 4 m/sec last year and 5 m/sec now. He says that he is asymptomatic but he is not active at all and may be in some denial. Given his high gradient and severe LVH I think it is time to proceed with AVR to prevent further deterioration of his LV function. I discussed the echo findings with him and my recommendation to proceed with surgery. I discussed the pros and cons of mechanical and tissue valves and my recommendation for a tissue valve given his age of 32. He is in agreement. He will require cardiac cath preop. He has iron deficiency anemia and had a hgb of 6 in Dec 2014. 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 It is unknown if this GI bleeding is related to his aortic stenosis but we do see this occasionally. I discussed the operative procedure with the patient  including alternatives, benefits and risks; including but not limited to bleeding, blood transfusion, infection, stroke, myocardial infarction, heart block requiring a permanent pacemaker, organ dysfunction, and death. All of his questions have been answered. [name] understands and agrees to proceed.  We will schedule surgery once cardiac cath has been performed.   Plan:  Will discuss scheduling cardiac cath  with Dr. Marlou Porch and see the patient back to discuss results and make plans for AVR.  I spent 80 minutes performing this consultation and > 50% of this time was spent face to face counseling and coordinating the care of this patient.

## 2013-09-27 ENCOUNTER — Telehealth: Payer: Self-pay | Admitting: Cardiology

## 2013-09-27 ENCOUNTER — Other Ambulatory Visit (INDEPENDENT_AMBULATORY_CARE_PROVIDER_SITE_OTHER): Payer: Medicare Other

## 2013-09-27 ENCOUNTER — Encounter: Payer: Self-pay | Admitting: Cardiology

## 2013-09-27 DIAGNOSIS — I359 Nonrheumatic aortic valve disorder, unspecified: Secondary | ICD-10-CM | POA: Diagnosis not present

## 2013-09-27 DIAGNOSIS — E785 Hyperlipidemia, unspecified: Secondary | ICD-10-CM

## 2013-09-27 DIAGNOSIS — Z01812 Encounter for preprocedural laboratory examination: Secondary | ICD-10-CM | POA: Diagnosis not present

## 2013-09-27 DIAGNOSIS — I350 Nonrheumatic aortic (valve) stenosis: Secondary | ICD-10-CM

## 2013-09-27 LAB — CBC WITH DIFFERENTIAL/PLATELET
BASOS ABS: 0 10*3/uL (ref 0.0–0.1)
BASOS PCT: 0.5 % (ref 0.0–3.0)
EOS ABS: 0.1 10*3/uL (ref 0.0–0.7)
Eosinophils Relative: 1.6 % (ref 0.0–5.0)
HEMATOCRIT: 40.1 % (ref 39.0–52.0)
HEMOGLOBIN: 13.4 g/dL (ref 13.0–17.0)
LYMPHS ABS: 1.3 10*3/uL (ref 0.7–4.0)
LYMPHS PCT: 23.6 % (ref 12.0–46.0)
MCHC: 33.5 g/dL (ref 30.0–36.0)
MCV: 92.1 fl (ref 78.0–100.0)
MONO ABS: 0.8 10*3/uL (ref 0.1–1.0)
Monocytes Relative: 13.7 % — ABNORMAL HIGH (ref 3.0–12.0)
Neutro Abs: 3.3 10*3/uL (ref 1.4–7.7)
Neutrophils Relative %: 60.6 % (ref 43.0–77.0)
Platelets: 155 10*3/uL (ref 150.0–400.0)
RBC: 4.36 Mil/uL (ref 4.22–5.81)
RDW: 18.6 % — AB (ref 11.5–15.5)
WBC: 5.5 10*3/uL (ref 4.0–10.5)

## 2013-09-27 LAB — BASIC METABOLIC PANEL
BUN: 13 mg/dL (ref 6–23)
CALCIUM: 9.1 mg/dL (ref 8.4–10.5)
CHLORIDE: 101 meq/L (ref 96–112)
CO2: 27 mEq/L (ref 19–32)
Creatinine, Ser: 0.8 mg/dL (ref 0.4–1.5)
GFR: 130.11 mL/min (ref >60.00)
Glucose, Bld: 91 mg/dL (ref 70–99)
Potassium: 4 mEq/L (ref 3.5–5.1)
SODIUM: 134 meq/L — AB (ref 135–145)

## 2013-09-27 LAB — PROTIME-INR
INR: 1.1 ratio — ABNORMAL HIGH (ref 0.8–1.0)
Prothrombin Time: 12.3 s (ref 9.6–13.1)

## 2013-09-27 NOTE — Telephone Encounter (Signed)
Spoke with spouse advised that patient needs to be heart cath. Cath scheduled for 10/01/2013 @ 7:30 am. Patient will need labs today. Will give instructions when patient comes in for labs.

## 2013-09-27 NOTE — Telephone Encounter (Signed)
Called about scheduled Cath.

## 2013-10-01 ENCOUNTER — Ambulatory Visit (HOSPITAL_COMMUNITY)
Admission: RE | Admit: 2013-10-01 | Discharge: 2013-10-01 | Disposition: A | Discharge status: Home / Self Care | Payer: Medicare Other | Source: Ambulatory Visit | Attending: Cardiology | Admitting: Cardiology

## 2013-10-01 ENCOUNTER — Encounter (HOSPITAL_COMMUNITY)
Admission: RE | Disposition: A | Discharge status: Home / Self Care | Payer: Self-pay | Source: Ambulatory Visit | Attending: Cardiology

## 2013-10-01 DIAGNOSIS — I350 Nonrheumatic aortic (valve) stenosis: Secondary | ICD-10-CM

## 2013-10-01 DIAGNOSIS — Z86718 Personal history of other venous thrombosis and embolism: Secondary | ICD-10-CM | POA: Diagnosis not present

## 2013-10-01 DIAGNOSIS — I2510 Atherosclerotic heart disease of native coronary artery without angina pectoris: Secondary | ICD-10-CM | POA: Diagnosis not present

## 2013-10-01 DIAGNOSIS — I10 Essential (primary) hypertension: Secondary | ICD-10-CM | POA: Insufficient documentation

## 2013-10-01 DIAGNOSIS — E119 Type 2 diabetes mellitus without complications: Secondary | ICD-10-CM | POA: Diagnosis not present

## 2013-10-01 DIAGNOSIS — N529 Male erectile dysfunction, unspecified: Secondary | ICD-10-CM | POA: Insufficient documentation

## 2013-10-01 DIAGNOSIS — E7800 Pure hypercholesterolemia, unspecified: Secondary | ICD-10-CM | POA: Diagnosis not present

## 2013-10-01 DIAGNOSIS — I359 Nonrheumatic aortic valve disorder, unspecified: Secondary | ICD-10-CM | POA: Diagnosis not present

## 2013-10-01 DIAGNOSIS — B182 Chronic viral hepatitis C: Secondary | ICD-10-CM | POA: Insufficient documentation

## 2013-10-01 DIAGNOSIS — D509 Iron deficiency anemia, unspecified: Secondary | ICD-10-CM | POA: Diagnosis not present

## 2013-10-01 HISTORY — PX: CARDIAC CATHETERIZATION: SHX172

## 2013-10-01 HISTORY — PX: LEFT AND RIGHT HEART CATHETERIZATION WITH CORONARY ANGIOGRAM: SHX5449

## 2013-10-01 LAB — POCT I-STAT 3, VENOUS BLOOD GAS (G3P V)
Acid-Base Excess: 1 mmol/L (ref 0.0–2.0)
Bicarbonate: 27.3 mEq/L — ABNORMAL HIGH (ref 20.0–24.0)
O2 Saturation: 72 %
PH VEN: 7.368 — AB (ref 7.250–7.300)
TCO2: 29 mmol/L (ref 0–100)
pCO2, Ven: 47.4 mmHg (ref 45.0–50.0)
pO2, Ven: 40 mmHg (ref 30.0–45.0)

## 2013-10-01 LAB — GLUCOSE, CAPILLARY
Glucose-Capillary: 91 mg/dL (ref 70–99)
Glucose-Capillary: 96 mg/dL (ref 70–99)

## 2013-10-01 LAB — POCT ACTIVATED CLOTTING TIME: Activated Clotting Time: 210 seconds

## 2013-10-01 SURGERY — LEFT AND RIGHT HEART CATHETERIZATION WITH CORONARY ANGIOGRAM
Anesthesia: LOCAL

## 2013-10-01 MED ORDER — MIDAZOLAM HCL 2 MG/2ML IJ SOLN
INTRAMUSCULAR | Status: AC
  Filled 2013-10-01: qty 2

## 2013-10-01 MED ORDER — HEPARIN (PORCINE) IN NACL 2-0.9 UNIT/ML-% IJ SOLN
INTRAMUSCULAR | Status: AC
  Filled 2013-10-01: qty 1000

## 2013-10-01 MED ORDER — NITROGLYCERIN 0.2 MG/ML ON CALL CATH LAB
INTRAVENOUS | Status: AC
  Filled 2013-10-01: qty 1

## 2013-10-01 MED ORDER — SODIUM CHLORIDE 0.9 % IJ SOLN: 3.0000 mL | INTRAMUSCULAR | Status: DC | PRN

## 2013-10-01 MED ORDER — SODIUM CHLORIDE 0.9 % IV SOLN: 250.0000 mL | INTRAVENOUS | Status: DC | PRN

## 2013-10-01 MED ORDER — ASPIRIN 81 MG PO CHEW
81.0000 mg | CHEWABLE_TABLET | ORAL | Status: AC
  Administered 2013-10-01: 81 mg via ORAL
  Filled 2013-10-01: qty 1

## 2013-10-01 MED ORDER — SODIUM CHLORIDE 0.9 % IV SOLN
INTRAVENOUS | Status: DC
  Administered 2013-10-01: 07:00:00 via INTRAVENOUS

## 2013-10-01 MED ORDER — LIDOCAINE HCL (PF) 1 % IJ SOLN
INTRAMUSCULAR | Status: AC
  Filled 2013-10-01: qty 30

## 2013-10-01 MED ORDER — FENTANYL CITRATE 0.05 MG/ML IJ SOLN
INTRAMUSCULAR | Status: AC
  Filled 2013-10-01: qty 2

## 2013-10-01 MED ORDER — VERAPAMIL HCL 2.5 MG/ML IV SOLN
INTRAVENOUS | Status: AC
  Filled 2013-10-01: qty 2

## 2013-10-01 MED ORDER — SODIUM CHLORIDE 0.9 % IV SOLN: 1.0000 mL/kg/h | INTRAVENOUS | Status: DC

## 2013-10-01 MED ORDER — SODIUM CHLORIDE 0.9 % IJ SOLN: 3.0000 mL | Freq: Two times a day (BID) | INTRAMUSCULAR | Status: DC

## 2013-10-01 NOTE — H&P (View-Only) (Signed)
[place]. [address], [address], [place]  [address] Phone: [phone] Fax:  [phone]  Date:  09/13/2013   ID:  [name], DOB 09-13-1940, MRN [id number]  PCP:  Lujean Amel, MD   History of Present Illness: [name] is a 73 y.o. male with severe aortic stenosis on echocardiogram 4/14, hepatitis C, moderate non-flow-limiting coronary artery disease on catheterization in 2010 here for followup.  Interestingly in 2010, no significant evidence of aortic stenosis on cardiac catheterization , obliteration of LV cavity during contraction due to hyperdynamic nature. However, echocardiogram 09/04/13 demonstrates severe aortic stenosis:  Hepatitis C. 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 Erectile dysfunction noted.  Moderate coronary artery disease, no flow limitation with 50% RCA, a 30-40% circumflex, 50% second diagonal.  Had a hospitalization in December of 2014 secondary to anemia. Colonoscopy performed. Had transfusion. EGD showed duodenitis. He did have chest discomfort which was thought to be secondary to anemia hemoglobin 6. Had capsule endoscopy without any conclusive evidence of bleeding. Went to [facility] in referral. GI notes reviewed.  Loosing weight. Trying to eat well.   ECHO: 09/05/13: - Left ventricle: The cavity size was normal. There was severe concentric hypertrophy. Systolic function was normal. The estimated ejection fraction was in the range of 60% to 65%. Wall motion was normal; there were no regional wall motion abnormalities. Features are consistent with a pseudonormal left ventricular filling pattern, with concomitant abnormal relaxation and increased filling pressure (grade 2 diastolic dysfunction). - Aortic valve: There was severe stenosis. Peak 5.25m/s, 67mmHg mean Mild regurgitation. - Left atrium: The atrium was moderately to severely dilated. - Atrial septum: No defect or patent foramen ovale  was identified.    Wt Readings from Last 3 Encounters:  09/13/13 140 lb (63.504 kg)  09/03/13 141 lb (63.957 kg)  07/19/13 145 lb (65.772 kg)     Past Medical History  Diagnosis Date  . Essential hypertension, benign   . Diabetes mellitus without complication   . Hep C w/o coma, chronic   . Hypercholesterolemia   . ED (erectile dysfunction)   . History of DVT of lower extremity   . Acute epididymo-orchitis   . Aortic valve disorders     Past Surgical History  Procedure Laterality Date  . Laminectomy  06/2005    Dr Joya Salm. multiple lumbar level laminectomies.   . Knee arthroscopy Left 05/2009    Dr Collier Salina  . 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   . Colonoscopy  01/2010    internal hemorrhoids.  Dr Deatra Ina  . Esophagogastroduodenoscopy N/A 04/24/2013    Procedure: ESOPHAGOGASTRODUODENOSCOPY (EGD);  Surgeon: Jerene Bears, MD;  Location: [facility] ENDOSCOPY;  Service: Endoscopy;  Laterality: N/A;  . Bunionectomy Bilateral 2008  . Inguinal hernia repair Right     Current Outpatient Prescriptions  Medication Sig Dispense Refill  . amLODipine (NORVASC) 10 MG tablet Take 10 mg by mouth daily.      Marland Kitchen atorvastatin (LIPITOR) 20 MG tablet Take 20 mg by mouth at bedtime.      . ferrous sulfate 325 (65 FE) MG tablet Take 1 tablet (325 mg total) by mouth 2 (two) times daily with a meal.  60 tablet  3  . lisinopril-hydrochlorothiazide (PRINZIDE,ZESTORETIC) 20-25 MG per tablet Take 1 tablet by mouth daily.      . metFORMIN (GLUCOPHAGE) 500 MG tablet Take 1,000-1,500 mg by mouth 2 (two) times daily with  a meal. Take 2 tabs in the morning and 3 tabs in the evening      . metoprolol succinate (TOPROL-XL) 25 MG 24 hr tablet Take 25 mg by mouth daily.       No current facility-administered medications for this visit.    Allergies:    Allergies  Allergen Reactions  . Crestor [Rosuvastatin]     Discomfort   . Vytorin [Ezetimibe-Simvastatin]     Leg pains      Social History:  The patient  reports that he has never smoked. He has never used smokeless tobacco. He reports that he drinks alcohol. He reports that he does not use illicit drugs.   ROS:  Please see the history of present illness.   He denies any fevers, chills, orthopnea, PND, syncope, chest pain, significant shortness of breath. Has had issues with iron deficiency anemia, possible source of bleeding.  PHYSICAL EXAM: VS:  BP 144/68  Pulse 60  Ht 5\' 11"  (1.803 m)  Wt 140 lb (63.504 kg)  BMI 19.53 kg/m2 Thin in no acute distress HEENT: normal Neck: no JVD, carotid bruits bilaterally from heart murmur. Delayed carotid upstroke. Cardiac:  normal S1, S2; RRR; 3/6 systolic crescendo decrescendo murmurright upper sternal border Lungs:  clear to auscultation bilaterally, no wheezing, rhonchi or rales Abd: soft, nontender, no hepatomegaly Ext: no edema Skin: warm and dry Neuro: no focal abnormalities noted  EKG:  None today    Labs: LDL 75 on 5/13.  ASSESSMENT AND PLAN:  1. Severe aortic stenosis-echocardiogram on 09/03/12 demonstrated severe aortic stenosis with peak velocity of 4.0 m/s. Now peak velocity is 5.0 m/s with a mean gradient of 61 mm mercury. It has advanced. Currently denies symptoms of shortness of breath, angina, syncope. I will have him referred to cardiothoracic surgery for further discussion of aortic valve replacement. Last catheterization was in 2010 with moderate CAD. He will need repeat cardiac catheterization if we decide to proceed with surgery. 2. Hypertension-currently well controlled. 3. Hyperlipidemia-atorvastatin. LDL 75 4. Anemia, iron deficiency, GI workup here with capsule, endoscopy did not show any overt signs of bleeding. He is going to [facility] for further evaluation (July). He states he has not had any bleeding episodes recently. I wonder if there could be a connection to aortic stenosis, Heyde's syndrome where aortic stenosis can cause degradation  of von Willebrand's factor leading to increased rate of bleeding. Occasionally when heart valve is corrected in this situation, bleeding disorder is improved. He does not have any frequent nosebleeds or spontaneous bruising.  Signed, Candee Furbish, MD [facility]  09/13/2013 12:29 PM

## 2013-10-01 NOTE — Discharge Instructions (Signed)
°  NO METFORMIN FOR 2 DAYS  Radial Site Care Refer to this sheet in the next few weeks. These instructions provide you with information on caring for yourself after your procedure. Your caregiver may also give you more specific instructions. Your treatment has been planned according to current medical practices, but problems sometimes occur. Call your caregiver if you have any problems or questions after your procedure. HOME CARE INSTRUCTIONS  You may shower the day after the procedure.Remove the bandage (dressing) and gently wash the site with plain soap and water.Gently pat the site dry.  Do not apply powder or lotion to the site.  Do not submerge the affected site in water for 3 to 5 days.  Inspect the site at least twice daily.  Do not flex or bend the affected arm for 24 hours.  No lifting over 5 pounds (2.3 kg) for 5 days after your procedure.  Do not drive home if you are discharged the same day of the procedure. Have someone else drive you.  You may drive 24 hours after the procedure unless otherwise instructed by your caregiver.  Do not operate machinery or power tools for 24 hours.  A responsible adult should be with you for the first 24 hours after you arrive home. What to expect:  Any bruising will usually fade within 1 to 2 weeks.  Blood that collects in the tissue (hematoma) may be painful to the touch. It should usually decrease in size and tenderness within 1 to 2 weeks. SEEK IMMEDIATE MEDICAL CARE IF:  You have unusual pain at the radial site.  You have redness, warmth, swelling, or pain at the radial site.  You have drainage (other than a small amount of blood on the dressing).  You have chills.  You have a fever or persistent symptoms for more than 72 hours.  You have a fever and your symptoms suddenly get worse.  Your arm becomes pale, cool, tingly, or numb.  You have heavy bleeding from the site. Hold pressure on the site. Document Released:  05/28/2010 Document Revised: 07/18/2011 Document Reviewed: 05/28/2010 [facility] Patient Information 2014 [place], [place].

## 2013-10-01 NOTE — Interval H&P Note (Signed)
History and Physical Interval Note:  10/01/2013 7:42 AM  [name]  has presented today for surgery, with the diagnosis of cp  The various methods of treatment have been discussed with the patient and family. After consideration of risks, benefits and other options for treatment, the patient has consented to  Procedure(s): LEFT AND RIGHT HEART CATHETERIZATION WITH CORONARY ANGIOGRAM (N/A) as a surgical intervention .  The patient's history has been reviewed, patient examined, no change in status, stable for surgery.  I have reviewed the patient's chart and labs.  Questions were answered to the patient's satisfaction.     [employer]

## 2013-10-01 NOTE — CV Procedure (Signed)
    CARDIAC CATHETERIZATION  PROCEDURE:  Selective coronary angiography via the radial artery approach. Right heart catheterization via the right brachial vein approach.  INDICATIONS:  73 year old male with severe aortic stenosis, Dr. Cyndia Bent, preoperative cardiac catheterization.  The risks, benefits, and details of the procedure were explained to the patient, including possibilities of stroke, heart attack, death, renal impairment, arterial damage, bleeding.  The patient verbalized understanding and wanted to proceed.  Informed written consent was obtained.  PROCEDURE TECHNIQUE:  The right brachial vein site was prepped and draped in a sterile fashion, IV was placed. A 5 French sheath was attempted however there was buckling of the sheath at the skin insertion point and this was exchanged for a glide sheath which was inserted without difficulty. 5 [place] Swan-Ganz catheter was then placed into right-sided heart structures, up to wedge position without difficulty. Pulmonary artery saturation drawn. Oxygen saturation drawn, aortic, from left heart catheterization site. Following this procedure, right heart catheter was removed without difficulty. Brachial vein sheath was removed and pressure held.  The right radial artery site was prepped and draped in a sterile fashion. One percent lidocaine was used for local anesthesia. Using the modified Seldinger technique a 5 French hydrophilic sheath was inserted into the radial artery without difficulty. 3 mg of verapamil was administered via the sheath. A Judkins right #4 catheter with the guidance of a Versicore wire was placed in the right coronary cusp and selectively cannulated the right coronary artery. After traversing the aortic arch, 3200 units of heparin IV was administered. A Judkins left #3.5 catheter was used to selectively cannulate the left main artery. Multiple views with hand injection of Omnipaque were obtained.  Following the procedure, sheath  was removed, patient was hemodynamically stable, hemostasis was maintained with a Terumo T band.   CONTRAST:  Total of 50 ml.    FLOUROSCOPY TIME: 3.9 min.  COMPLICATIONS:  None.    HEMODYNAMICS:  Aortic pressure was 098/11 mmHg; LV systolic pressure was not measured  Right heart catheterization:   Right atrium: 7/8 mm of mercury Right ventricle: 43/4 with an end-diastolic pressure of 8 mm mercury PA pressure: 41/14 with a mean of 25 mmHg Pulmonary capillary wedge pressure: 17/16 with a mean of 12 mm mercury  Cardiac output: 5.77 L per minute, cardiac index 3.17  ANGIOGRAPHIC DATA:    Left main: No angiographically significant disease, branches into circumflex and LAD  Left anterior descending (LAD): Minor luminal irregularities of mid LAD, mild ostial first and second diagonal stenosis, nonflow limiting.  Circumflex artery (CIRC): To obtuse marginal branches no angiographically significant disease, minor luminal irregularities distally.  Right coronary artery (RCA): Mild to moderate disease of the right coronary artery diffuse but no flow limitation. Gives rise to the posterior descending artery, dominant.  LEFT VENTRICULOGRAM: Not performed   IMPRESSIONS:  Minor luminal irregularities throughout vessels. Minimally elevated right-sided heart pressures. Normal cardiac output. Severe aortic calcification and limitation of aortic valve noted during angiography. (Aortic valve was not crossed). Echo data demonstrated severe stenosis, severe calcification, mild aortic regurgitation, peak velocity of 5.0 m/s, mean gradient of 61 mm mercury.  RECOMMENDATION:  Results will be forwarded to Dr. Cyndia Bent.

## 2013-10-02 LAB — POCT I-STAT 3, ART BLOOD GAS (G3+)
ACID-BASE EXCESS: 1 mmol/L (ref 0.0–2.0)
Bicarbonate: 25.4 mEq/L — ABNORMAL HIGH (ref 20.0–24.0)
O2 SAT: 95 %
TCO2: 27 mmol/L (ref 0–100)
pCO2 arterial: 39.9 mmHg (ref 35.0–45.0)
pH, Arterial: 7.412 (ref 7.350–7.450)
pO2, Arterial: 76 mmHg — ABNORMAL LOW (ref 80.0–100.0)

## 2013-10-02 LAB — POCT ACTIVATED CLOTTING TIME: Activated Clotting Time: 188 seconds

## 2013-10-15 ENCOUNTER — Encounter: Payer: Self-pay | Admitting: Nurse Practitioner

## 2013-10-15 ENCOUNTER — Ambulatory Visit (INDEPENDENT_AMBULATORY_CARE_PROVIDER_SITE_OTHER): Payer: Medicare Other | Admitting: Nurse Practitioner

## 2013-10-15 VITALS — BP 140/70 | HR 60 | Ht 71.0 in | Wt 140.8 lb

## 2013-10-15 DIAGNOSIS — I359 Nonrheumatic aortic valve disorder, unspecified: Secondary | ICD-10-CM | POA: Diagnosis not present

## 2013-10-15 DIAGNOSIS — I10 Essential (primary) hypertension: Secondary | ICD-10-CM

## 2013-10-15 DIAGNOSIS — I350 Nonrheumatic aortic (valve) stenosis: Secondary | ICD-10-CM

## 2013-10-15 DIAGNOSIS — I2510 Atherosclerotic heart disease of native coronary artery without angina pectoris: Secondary | ICD-10-CM

## 2013-10-15 NOTE — Progress Notes (Signed)
[name] Date of Birth: August 05, 1940 Medical Record #[id number]  History of Present Illness: [name] is seen back today for a cardiac catheterization. He is seen for Dr. Marlou Porch. He has multiple medical issues. These include severe aortic stenosis, hepatitis C, nonobstructive coronary artery disease, hypertension, diabetes, hyperlipidemia, and erectile dysfunction.  He was seen back here one month ago.  A recent echocardiogram showed severe aortic stenosis and he was referred for repeat cardiac catheterization. Results are noted below. He was  referred to Dr. Cyndia Bent.  Dr. 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 His aortic stenosis has progressed significantly from last year with a peak velocity of 4 m/sec last year and 5 m/sec now. He says that he is asymptomatic but he is not active at all and may be in some denial. Given his high gradient and severe LVH I think it is time to proceed with AVR to prevent further deterioration of his LV function. I discussed the echo findings with him and my recommendation to proceed with surgery. I discussed the pros and cons of mechanical and tissue valves and my recommendation for a tissue valve given his age of 7. He is in agreement. He will require cardiac cath preop. He has iron deficiency anemia and had a hgb of 6 in Dec 2014. He had a capsule endoscopy showing a polypoid lesion in the mid jejunum and has been referred to [facility] for further workup. It is unknown if this GI bleeding is related to his aortic stenosis but we do see this occasionally. I discussed the operative procedure with the patient including alternatives, benefits and risks; including but not limited to bleeding, blood transfusion, infection, stroke, myocardial infarction, heart block requiring a permanent pacemaker, organ  dysfunction, and death. All of his questions have been answered. [name] understands and agrees to proceed. We will schedule surgery once cardiac cath has been performed."  He comes back today. He is here alone.  He does not know why he is here. Feels ok. No symptoms - no chest pain/SOB/syncope. Cath site ok. Lots of questions about his need for surgery. Bp ok. Has minimized his activities since his cath.  Current Outpatient Prescriptions  Medication Sig Dispense Refill  . amLODipine (NORVASC) 10 MG tablet Take 10 mg by mouth daily.      Marland Kitchen atorvastatin (LIPITOR) 20 MG tablet Take 20 mg by mouth at bedtime.      . ferrous sulfate 325 (65 FE) MG tablet Take 1 tablet (325 mg total) by mouth 2 (two) times daily with a meal.  60 tablet  3  . lisinopril-hydrochlorothiazide (PRINZIDE,ZESTORETIC) 20-25 MG per tablet Take 1 tablet by mouth daily.      . metFORMIN (GLUCOPHAGE) 500 MG tablet Take 1,000-1,500 mg by mouth 2 (two) times daily with a meal. Take 2 tabs in the morning and 3 tabs in the evening      . metoprolol succinate (TOPROL-XL) 25 MG 24 hr tablet Take 25 mg by mouth daily.      . pantoprazole (PROTONIX) 40 MG tablet Take 40 mg by mouth daily.       No current facility-administered medications for this visit.    Allergies  Allergen Reactions  . Crestor [Rosuvastatin]     Discomfort, RESTLESS, CAN'T SLEEP   . Vytorin [Ezetimibe-Simvastatin]  Leg pains     Past Medical History  Diagnosis Date  . Essential hypertension, benign   . Diabetes mellitus without complication   . Hep C w/o coma, chronic   . Hypercholesterolemia   . ED (erectile dysfunction)   . History of DVT of lower extremity   . Acute epididymo-orchitis   . Aortic valve disorders     Past Surgical History  Procedure Laterality Date  . Laminectomy  06/2005    Dr Joya Salm. multiple lumbar level laminectomies.   . Knee arthroscopy Left 05/2009    Dr Collier Salina  . Tee without cardioversion  11/2003     Aoritic valve sclerosis.   . Colonoscopy  01/2010    internal hemorrhoids.  Dr Deatra Ina  . Esophagogastroduodenoscopy N/A 04/24/2013    Procedure: ESOPHAGOGASTRODUODENOSCOPY (EGD);  Surgeon: Jerene Bears, MD;  Location: [facility] ENDOSCOPY;  Service: Endoscopy;  Laterality: N/A;  . Bunionectomy Bilateral 2008  . Inguinal hernia repair Right     History  Smoking status  . Former Smoker  . Types: Cigarettes  . Quit date: 09/26/1986  Smokeless tobacco  . Never Used    History  Alcohol Use  . Yes    Family History  Problem Relation Age of Onset  . Diabetes Father   . Stroke Father   . Diabetes Mother   . Stroke Mother   . Hypertension      family history    Review of Systems: The review of systems is per the HPI.  All other systems were reviewed and are negative.  Physical Exam: BP 140/70  Pulse 60  Ht 5\' 11"  (1.803 m)  Wt 140 lb 12.8 oz (63.866 kg)  BMI 19.65 kg/m2  SpO2 97% Patient is very pleasant and in no acute distress. Thin. Skin is warm and dry. Color is normal.  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 Normocephalic/atraumatic. PERRL. Sclera are nonicteric. Neck is supple. No masses. No JVD. Lungs are clear. Cardiac exam shows a regular rate and rhythm. Harsh outflow murmur noted. Abdomen is soft. Extremities are without edema. Gait and ROM are intact. No gross neurologic deficits noted.  LABORATORY DATA:  Lab Results  Component Value Date   WBC 5.5 09/27/2013   HGB 13.4 09/27/2013   HCT 40.1 09/27/2013   PLT 155.0 09/27/2013   GLUCOSE 91 09/27/2013   ALT 28 04/23/2013   AST 34 04/23/2013   NA 134* 09/27/2013   K 4.0 09/27/2013   CL 101 09/27/2013   CREATININE 0.8 09/27/2013   BUN 13 09/27/2013   CO2 27 09/27/2013   INR 1.1* 09/27/2013   CARDIAC CATHETERIZATION  PROCEDURE: Selective coronary angiography via the radial artery approach. Right heart catheterization via the right brachial vein approach.  INDICATIONS: 73 year old male with severe aortic stenosis, Dr.  Cyndia Bent, preoperative cardiac catheterization.  The risks, benefits, and details of the procedure were explained to the patient, including possibilities of stroke, heart attack, death, renal impairment, arterial damage, bleeding. The patient verbalized understanding and wanted to proceed. Informed written consent was obtained.  PROCEDURE TECHNIQUE: The right brachial vein site was prepped and draped in a sterile fashion, IV was placed. A 5 French sheath was attempted however there was buckling of the sheath at the skin insertion point and this was exchanged for a glide sheath which was inserted without difficulty. 5 [place] Swan-Ganz catheter was then placed into right-sided heart structures, up to wedge position without difficulty. Pulmonary artery saturation drawn. Oxygen saturation drawn, aortic, from left heart  catheterization site. Following this procedure, right heart catheter was removed without difficulty. Brachial vein sheath was removed and pressure held.  The right radial artery site was prepped and draped in a sterile fashion. One percent lidocaine was used for local anesthesia. Using the modified Seldinger technique a 5 French hydrophilic sheath was inserted into the radial artery without difficulty. 3 mg of verapamil was administered via the sheath. A Judkins right #4 catheter with the guidance of a Versicore wire was placed in the right coronary cusp and selectively cannulated the right coronary artery. After traversing the aortic arch, 3200 units of heparin IV was administered. A Judkins left #3.5 catheter was used to selectively cannulate the left main artery. Multiple views with hand injection of Omnipaque were obtained. Following the procedure, sheath was removed, patient was hemodynamically stable, hemostasis was maintained with a Terumo T band.  CONTRAST: Total of 50 ml.  FLOUROSCOPY TIME: 3.9 min.  COMPLICATIONS: None.  HEMODYNAMICS: Aortic pressure was 672/09 mmHg; LV systolic pressure  was not measured  Right heart catheterization:  Right atrium: 7/8 mm of mercury  Right ventricle: 43/4 with an end-diastolic pressure of 8 mm mercury  PA pressure: 41/14 with a mean of 25 mmHg  Pulmonary capillary wedge pressure: 17/16 with a mean of 12 mm mercury  Cardiac output: 5.77 L per minute, cardiac index 3.17  ANGIOGRAPHIC DATA:  Left main: No angiographically significant disease, branches into circumflex and LAD  Left anterior descending (LAD): Minor luminal irregularities of mid LAD, mild ostial first and second diagonal stenosis, nonflow limiting.  Circumflex artery (CIRC): To obtuse marginal branches no angiographically significant disease, minor luminal irregularities distally.  Right coronary artery (RCA): Mild to moderate disease of the right coronary artery diffuse but no flow limitation. Gives rise to the posterior descending artery, dominant.  LEFT VENTRICULOGRAM: Not performed  IMPRESSIONS:  1. Minor luminal irregularities throughout vessels. 2. Minimally elevated right-sided heart pressures. Normal cardiac output. 3. Severe aortic calcification and limitation of aortic valve noted during angiography. (Aortic valve was not crossed). Echo data demonstrated severe stenosis, severe calcification, mild aortic regurgitation, peak velocity of 5.0 m/s, mean gradient of 61 mm mercury. RECOMMENDATION: Results will be forwarded to Dr. Cyndia Bent.       Echo Study Conclusions from April 2015  - Left ventricle: The cavity size was normal. There was severe concentric hypertrophy. Systolic function was normal. The estimated ejection fraction was in the range of 60% to 65%. Wall motion was normal; there were no regional wall motion abnormalities. Features are consistent with a pseudonormal left ventricular filling pattern, with concomitant abnormal relaxation and increased filling pressure (grade 2 diastolic dysfunction). - Aortic valve: There was severe stenosis.  Mild regurgitation. - Left atrium: The atrium was moderately to severely dilated. - Atrial septum: No defect or patent foramen ovale was identified.   Assessment / Plan: 1. Severe AS - with recent cath - referring back for discussion for AVR - to see Dr. Cyndia Bent tomorrow at 3pm.  2. HTN - would leave on current regimen for now.   3. Hep C  4. DM  Patient is agreeable to this plan and will call if any problems develop in the interim.   Burtis Junes, RN, [facility] [address] [place], [place]  [address] [phone]

## 2013-10-15 NOTE — Patient Instructions (Signed)
See Dr. Cyndia Bent tomorrow at 3pm  Call the [place] office at [phone] if you have any questions, problems or concerns.

## 2013-10-16 ENCOUNTER — Ambulatory Visit (INDEPENDENT_AMBULATORY_CARE_PROVIDER_SITE_OTHER): Payer: Medicare Other | Admitting: Surgery

## 2013-10-16 ENCOUNTER — Encounter: Payer: Self-pay | Admitting: Surgery

## 2013-10-16 VITALS — BP 142/76 | HR 57 | Resp 20 | Ht 71.0 in | Wt 140.0 lb

## 2013-10-16 DIAGNOSIS — I359 Nonrheumatic aortic valve disorder, unspecified: Secondary | ICD-10-CM

## 2013-10-16 DIAGNOSIS — I2510 Atherosclerotic heart disease of native coronary artery without angina pectoris: Secondary | ICD-10-CM

## 2013-10-16 DIAGNOSIS — I350 Nonrheumatic aortic (valve) stenosis: Secondary | ICD-10-CM

## 2013-10-16 NOTE — Progress Notes (Signed)
HPI:  [name] returns today to discuss the results of his recent cardiac cath. This was done on 10/01/2013 and showed no significant coronary disease. PAP was 41.14 and CI was 3.17. He says that he continues to feel well and denies any symptoms.  Current Outpatient Prescriptions  Medication Sig Dispense Refill  . amLODipine (NORVASC) 10 MG tablet Take 10 mg by mouth daily.      Marland Kitchen atorvastatin (LIPITOR) 20 MG tablet Take 20 mg by mouth at bedtime.      Marland Kitchen lisinopril-hydrochlorothiazide (PRINZIDE,ZESTORETIC) 20-25 MG per tablet Take 1 tablet by mouth daily.      . metFORMIN (GLUCOPHAGE) 500 MG tablet Take 1,000-1,500 mg by mouth 2 (two) times daily with a meal. Take 2 tabs in the morning and 3 tabs in the evening      . metoprolol succinate (TOPROL-XL) 25 MG 24 hr tablet Take 25 mg by mouth daily.      . pantoprazole (PROTONIX) 40 MG tablet Take 40 mg by mouth daily.       No current facility-administered medications for this visit.     Physical Exam: He looks well. Lung exam is clear. Cardiac exam shows a regular rate and rhythm with a 3/6 harsh systolic murmur along RSB radiating into neck bilaterally.  Chest incision is healing well and sternum is stable. The leg incisions are healing well and there is no peripheral edema.    Diagnostic Tests:  CATHETERIZATION  PROCEDURE: Selective coronary angiography via the radial artery approach. Right heart catheterization via the right brachial vein approach.  INDICATIONS: 73 year old male with severe aortic stenosis, Dr. Cyndia Bent, preoperative cardiac catheterization.  The risks, benefits, and details of the procedure were explained to the patient, including possibilities of stroke, heart attack, death, renal impairment, arterial damage, bleeding. The patient verbalized understanding and wanted to proceed. Informed written consent was obtained.  PROCEDURE TECHNIQUE: The right brachial vein site was prepped and draped in a sterile  fashion, IV was placed. A 5 French sheath was attempted however there was buckling of the sheath at the skin insertion point and this was exchanged for a glide sheath which was inserted without difficulty. 5 [place] Swan-Ganz catheter was then placed into right-sided heart structures, up to wedge position without difficulty. Pulmonary artery saturation drawn. Oxygen saturation drawn, aortic, from left heart catheterization site. Following this procedure, right heart catheter was removed without difficulty. Brachial vein sheath was removed and pressure held.  The right radial artery site was prepped and draped in a sterile fashion. One percent lidocaine was used for local anesthesia. Using the modified Seldinger technique a 5 French hydrophilic sheath was inserted into the radial artery without difficulty. 3 mg of verapamil was administered via the sheath. A Judkins right #4 catheter with the guidance of a Versicore wire was placed in the right coronary cusp and selectively cannulated the right coronary artery. After traversing the aortic arch, 3200 units of heparin IV was administered. A Judkins left #3.5 catheter was used to selectively cannulate the left main artery. Multiple views with hand injection of Omnipaque were obtained. Following the procedure, sheath was removed, patient was hemodynamically stable, hemostasis was maintained with a Terumo T band.  CONTRAST: Total of 50 ml.  FLOUROSCOPY TIME: 3.9 min.  COMPLICATIONS: None.  HEMODYNAMICS: Aortic pressure was 914/78 mmHg; LV systolic pressure was not measured  Right heart catheterization:  Right atrium: 7/8 mm of mercury  Right ventricle: 43/4 with an end-diastolic pressure of 8  mm mercury  PA pressure: 41/14 with a mean of 25 mmHg  Pulmonary capillary wedge pressure: 17/16 with a mean of 12 mm mercury  Cardiac output: 5.77 L per minute, cardiac index 3.17  ANGIOGRAPHIC DATA:  Left main: No angiographically significant disease, branches into  circumflex and LAD  Left anterior descending (LAD): Minor luminal irregularities of mid LAD, mild ostial first and second diagonal stenosis, nonflow limiting.  Circumflex artery (CIRC): To obtuse marginal branches no angiographically significant disease, minor luminal irregularities distally.  Right coronary artery (RCA): Mild to moderate disease of the right coronary artery diffuse but no flow limitation. Gives rise to the posterior descending artery, dominant.  LEFT VENTRICULOGRAM: Not performed  IMPRESSIONS:  1. Minor luminal irregularities throughout vessels. 2. Minimally elevated right-sided heart pressures. Normal cardiac output. 3. Severe aortic calcification and limitation of aortic valve noted during angiography. (Aortic valve was not crossed). Echo data demonstrated severe stenosis, severe calcification, mild aortic regurgitation, peak velocity of 5.0 m/s, mean gradient of 61 mm mercury. RECOMMENDATION: Results will be forwarded to Dr. Cyndia Bent.    Impression:  He has severe aortic stenosis with a mean gradient of 61 mm Hg and severe concentric LVH with normal systolic function. I think AVR is warranted to prevent progressive LV deterioration. I am surprised that he is asymptomatic unless he is just in denial.  Plan:  He has a couple physician appointments over the next few weeks he would like to keep. One is at [facility] to discuss further treatment of his hepatitis C and the other is at [facility] to do a double balloon enteroscopy to further evaluate a polypoid lesion seen in the mid jejunum on capsule endoscopy. He will call us to schedule surgery after this.

## 2013-10-30 DIAGNOSIS — E119 Type 2 diabetes mellitus without complications: Secondary | ICD-10-CM | POA: Diagnosis not present

## 2013-10-30 DIAGNOSIS — B182 Chronic viral hepatitis C: Secondary | ICD-10-CM | POA: Diagnosis not present

## 2013-10-30 DIAGNOSIS — Z79899 Other long term (current) drug therapy: Secondary | ICD-10-CM | POA: Diagnosis not present

## 2013-10-30 DIAGNOSIS — E785 Hyperlipidemia, unspecified: Secondary | ICD-10-CM | POA: Diagnosis not present

## 2013-10-30 DIAGNOSIS — K7460 Unspecified cirrhosis of liver: Secondary | ICD-10-CM | POA: Diagnosis not present

## 2013-10-30 DIAGNOSIS — I10 Essential (primary) hypertension: Secondary | ICD-10-CM | POA: Diagnosis not present

## 2013-10-30 DIAGNOSIS — D509 Iron deficiency anemia, unspecified: Secondary | ICD-10-CM | POA: Diagnosis not present

## 2013-10-30 DIAGNOSIS — B1920 Unspecified viral hepatitis C without hepatic coma: Secondary | ICD-10-CM | POA: Diagnosis not present

## 2013-11-06 ENCOUNTER — Other Ambulatory Visit: Payer: Self-pay | Admitting: *Deleted

## 2013-11-06 DIAGNOSIS — E139 Other specified diabetes mellitus without complications: Secondary | ICD-10-CM | POA: Diagnosis not present

## 2013-11-06 DIAGNOSIS — B182 Chronic viral hepatitis C: Secondary | ICD-10-CM | POA: Diagnosis not present

## 2013-11-06 DIAGNOSIS — K921 Melena: Secondary | ICD-10-CM | POA: Diagnosis not present

## 2013-11-06 DIAGNOSIS — I359 Nonrheumatic aortic valve disorder, unspecified: Secondary | ICD-10-CM

## 2013-11-06 DIAGNOSIS — IMO0001 Reserved for inherently not codable concepts without codable children: Secondary | ICD-10-CM | POA: Insufficient documentation

## 2013-11-06 DIAGNOSIS — D500 Iron deficiency anemia secondary to blood loss (chronic): Secondary | ICD-10-CM | POA: Diagnosis not present

## 2013-11-08 ENCOUNTER — Encounter (HOSPITAL_COMMUNITY): Payer: Self-pay | Admitting: Pharmacy Technician

## 2013-11-11 ENCOUNTER — Telehealth: Payer: Self-pay | Admitting: Gastroenterology

## 2013-11-11 DIAGNOSIS — K922 Gastrointestinal hemorrhage, unspecified: Secondary | ICD-10-CM | POA: Diagnosis not present

## 2013-11-11 DIAGNOSIS — K649 Unspecified hemorrhoids: Secondary | ICD-10-CM | POA: Diagnosis not present

## 2013-11-11 MED ORDER — HYDROCORTISONE ACETATE 25 MG RE SUPP: 25.0000 mg | Freq: Every day | RECTAL | Status: DC

## 2013-11-11 NOTE — Telephone Encounter (Signed)
Pt states that for the past few days he has had spotty BRB from his hemorrhoids. States Sat am he had BRB in his bed where he had had bleeding onto the sheets. Pt wants to know if he can have something for the bleeding hemorrhoids. Pt also wanted to let Dr. Deatra Ina know that Monday he will be having Open heart surgery. Please advise.

## 2013-11-11 NOTE — Telephone Encounter (Signed)
Pt aware and script sent to pharmacy. 

## 2013-11-11 NOTE — Telephone Encounter (Signed)
Begin Colace or generic stool softener 300 mg daily Anusol suppositories one each bedtime and every morning for 7 days. If bleeding persists he should return to office once he has recovered from his surgery

## 2013-11-11 NOTE — Telephone Encounter (Signed)
Left message for pt to call back  °

## 2013-11-14 ENCOUNTER — Encounter (HOSPITAL_COMMUNITY): Payer: Self-pay

## 2013-11-14 ENCOUNTER — Ambulatory Visit (HOSPITAL_COMMUNITY)
Admission: RE | Admit: 2013-11-14 | Discharge: 2013-11-14 | Disposition: A | Discharge status: Home / Self Care | Payer: Medicare Other | Source: Ambulatory Visit | Attending: Surgery | Admitting: Surgery

## 2013-11-14 ENCOUNTER — Encounter (HOSPITAL_COMMUNITY)
Admission: RE | Admit: 2013-11-14 | Discharge: 2013-11-14 | Disposition: A | Discharge status: Home / Self Care | Payer: Medicare Other | Source: Ambulatory Visit | Attending: Surgery | Admitting: Surgery

## 2013-11-14 ENCOUNTER — Inpatient Hospital Stay (HOSPITAL_COMMUNITY)
Admission: RE | Admit: 2013-11-14 | Discharge: 2013-11-14 | Disposition: A | Discharge status: Home / Self Care | Payer: Medicare Other | Source: Ambulatory Visit | Attending: Surgery | Admitting: Surgery

## 2013-11-14 VITALS — BP 150/71 | HR 57 | Temp 97.5°F | Resp 18 | Ht 71.0 in | Wt 139.6 lb

## 2013-11-14 DIAGNOSIS — Z01810 Encounter for preprocedural cardiovascular examination: Secondary | ICD-10-CM | POA: Insufficient documentation

## 2013-11-14 DIAGNOSIS — Z79899 Other long term (current) drug therapy: Secondary | ICD-10-CM | POA: Diagnosis not present

## 2013-11-14 DIAGNOSIS — I10 Essential (primary) hypertension: Secondary | ICD-10-CM | POA: Diagnosis not present

## 2013-11-14 DIAGNOSIS — I359 Nonrheumatic aortic valve disorder, unspecified: Secondary | ICD-10-CM

## 2013-11-14 DIAGNOSIS — I658 Occlusion and stenosis of other precerebral arteries: Secondary | ICD-10-CM | POA: Insufficient documentation

## 2013-11-14 DIAGNOSIS — I6529 Occlusion and stenosis of unspecified carotid artery: Secondary | ICD-10-CM | POA: Insufficient documentation

## 2013-11-14 DIAGNOSIS — Z01818 Encounter for other preprocedural examination: Secondary | ICD-10-CM | POA: Insufficient documentation

## 2013-11-14 DIAGNOSIS — I700 Atherosclerosis of aorta: Secondary | ICD-10-CM | POA: Diagnosis present

## 2013-11-14 HISTORY — DX: Cardiac murmur, unspecified: R01.1

## 2013-11-14 HISTORY — DX: Personal history of other medical treatment: Z92.89

## 2013-11-14 HISTORY — DX: Unspecified osteoarthritis, unspecified site: M19.90

## 2013-11-14 LAB — COMPREHENSIVE METABOLIC PANEL
ALBUMIN: 3.8 g/dL (ref 3.5–5.2)
ALT: 102 U/L — ABNORMAL HIGH (ref 0–53)
AST: 82 U/L — AB (ref 0–37)
Alkaline Phosphatase: 70 U/L (ref 39–117)
Anion gap: 15 (ref 5–15)
BILIRUBIN TOTAL: 0.3 mg/dL (ref 0.3–1.2)
BUN: 12 mg/dL (ref 6–23)
CALCIUM: 9.2 mg/dL (ref 8.4–10.5)
CO2: 19 mEq/L (ref 19–32)
Chloride: 102 mEq/L (ref 96–112)
Creatinine, Ser: 0.76 mg/dL (ref 0.50–1.35)
GFR calc Af Amer: >90 mL/min (ref >90)
Glucose, Bld: 57 mg/dL — ABNORMAL LOW (ref 70–99)
Potassium: 4.2 mEq/L (ref 3.7–5.3)
Sodium: 136 mEq/L — ABNORMAL LOW (ref 137–147)
Total Protein: 9 g/dL — ABNORMAL HIGH (ref 6.0–8.3)

## 2013-11-14 LAB — URINE MICROSCOPIC-ADD ON

## 2013-11-14 LAB — PULMONARY FUNCTION TEST
DL/VA % PRED: 71 %
DL/VA: 3.34 ml/min/mmHg/L
DLCO UNC: 22.07 ml/min/mmHg
DLCO cor % pred: 65 %
DLCO cor: 22.07 ml/min/mmHg
DLCO unc % pred: 65 %
FEF 25-75 Post: 3.25 L/sec
FEF 25-75 Pre: 2.32 L/sec
FEF2575-%Change-Post: 40 %
FEF2575-%PRED-POST: 127 %
FEF2575-%Pred-Pre: 91 %
FEV1-%Change-Post: 12 %
FEV1-%PRED-POST: 123 %
FEV1-%Pred-Pre: 109 %
FEV1-Post: 3.71 L
FEV1-Pre: 3.29 L
FEV1FVC-%CHANGE-POST: 11 %
FEV1FVC-%Pred-Pre: 89 %
FEV6-%Change-Post: 1 %
FEV6-%PRED-PRE: 125 %
FEV6-%Pred-Post: 126 %
FEV6-PRE: 4.76 L
FEV6-Post: 4.81 L
FEV6FVC-%Change-Post: 0 %
FEV6FVC-%PRED-POST: 103 %
FEV6FVC-%Pred-Pre: 103 %
FVC-%CHANGE-POST: 0 %
FVC-%Pred-Post: 122 %
FVC-%Pred-Pre: 121 %
FVC-Post: 4.88 L
FVC-Pre: 4.84 L
PRE FEV1/FVC RATIO: 68 %
PRE FEV6/FVC RATIO: 98 %
Post FEV1/FVC ratio: 76 %
Post FEV6/FVC ratio: 99 %
RV % PRED: 59 %
RV: 1.49 L
TLC % pred: 89 %
TLC: 6.45 L

## 2013-11-14 LAB — URINALYSIS, ROUTINE W REFLEX MICROSCOPIC
BILIRUBIN URINE: NEGATIVE
Glucose, UA: NEGATIVE mg/dL
HGB URINE DIPSTICK: NEGATIVE
Ketones, ur: NEGATIVE mg/dL
NITRITE: NEGATIVE
PROTEIN: NEGATIVE mg/dL
SPECIFIC GRAVITY, URINE: 1.019 (ref 1.005–1.030)
UROBILINOGEN UA: 0.2 mg/dL (ref 0.0–1.0)
pH: 6 (ref 5.0–8.0)

## 2013-11-14 LAB — CBC
HEMATOCRIT: 37.9 % — AB (ref 39.0–52.0)
Hemoglobin: 13.1 g/dL (ref 13.0–17.0)
MCH: 31.1 pg (ref 26.0–34.0)
MCHC: 34.6 g/dL (ref 30.0–36.0)
MCV: 90 fL (ref 78.0–100.0)
PLATELETS: 151 10*3/uL (ref 150–400)
RBC: 4.21 MIL/uL — ABNORMAL LOW (ref 4.22–5.81)
RDW: 15.4 % (ref 11.5–15.5)
WBC: 4.9 10*3/uL (ref 4.0–10.5)

## 2013-11-14 LAB — HEMOGLOBIN A1C
HEMOGLOBIN A1C: 5.7 % — AB (ref <5.7)
MEAN PLASMA GLUCOSE: 117 mg/dL — AB (ref <117)

## 2013-11-14 LAB — BLOOD GAS, ARTERIAL
ACID-BASE DEFICIT: 0.7 mmol/L (ref 0.0–2.0)
Bicarbonate: 23.2 mEq/L (ref 20.0–24.0)
DRAWN BY: 344381
FIO2: 0.21 %
O2 SAT: 97.8 %
PATIENT TEMPERATURE: 98.6
TCO2: 24.3 mmol/L (ref 0–100)
pCO2 arterial: 36.2 mmHg (ref 35.0–45.0)
pH, Arterial: 7.422 (ref 7.350–7.450)
pO2, Arterial: 105 mmHg — ABNORMAL HIGH (ref 80.0–100.0)

## 2013-11-14 LAB — SURGICAL PCR SCREEN
MRSA, PCR: NEGATIVE
Staphylococcus aureus: POSITIVE — AB

## 2013-11-14 LAB — PROTIME-INR
INR: 1.16 (ref 0.00–1.49)
Prothrombin Time: 14.8 seconds (ref 11.6–15.2)

## 2013-11-14 LAB — APTT: APTT: 36 s (ref 24–37)

## 2013-11-14 MED ORDER — ALBUTEROL SULFATE (2.5 MG/3ML) 0.083% IN NEBU
2.5000 mg | INHALATION_SOLUTION | Freq: Once | RESPIRATORY_TRACT | Status: AC
  Administered 2013-11-14: 2.5 mg via RESPIRATORY_TRACT

## 2013-11-14 NOTE — Progress Notes (Signed)
Mupirocin Ointment Rx called into [facility] on [address] for positive PCR of staph. Pt notified and voiced understanding.

## 2013-11-14 NOTE — Progress Notes (Signed)
11/14/13 1232  OBSTRUCTIVE SLEEP APNEA  Have you ever been diagnosed with sleep apnea through a sleep study? No  Do you snore loudly (loud enough to be heard through closed doors)?  0  Do you often feel tired, fatigued, or sleepy during the daytime? 0  Has anyone observed you stop breathing during your sleep? 1  Do you have, or are you being treated for high blood pressure? 1  BMI more than 35 kg/m2? 0  Age over 71 years old? 1  Neck circumference greater than 40 cm/16 inches? 0  Gender: 1  Obstructive Sleep Apnea Score 4  Score 4 or greater  Results sent to PCP   This patient has screened at risk for sleep apnea using the STOP Bang tool during  A pre-surgical visit. A score of 4 or greater is at risk for sleep apnea.

## 2013-11-14 NOTE — Progress Notes (Signed)
VASCULAR LAB PRELIMINARY  PRELIMINARY  PRELIMINARY  PRELIMINARY  Pre-op Cardiac Surgery  Carotid Findings:  Bilateral:  1-39% ICA stenosis.  Vertebral artery flow is antegrade.      Upper Extremity Right Left  Brachial Pressures 131 Triphasic  131 Triphasic   Radial Waveforms Triphasic  Triphasic   Ulnar Waveforms Triphasic  Triphasic   Palmar Arch (Allen's Test) Doppler obliterates with radial compression, normal with ulnar compression. Within normal limits       Aysha Livecchi, RVT 11/14/2013, 10:39 AM

## 2013-11-15 NOTE — Progress Notes (Signed)
Anesthesia Chart Review:  Patient is a 73 year old male scheduled for AVR on 11/19/13 by Dr. Cyndia Bent.    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 OSA screening score is 4. PCP is Dr. Lujean Amel.  Cardiologist is Dr. Marlou Porch. GI is Dr. Erskine Emery with recent referral to Dr. Lazaro Arms with [facility] (see Care Everywhere) who is aware of plans for surgery and is deferring future HCV treatment until his anemia has been worked up and he has undergone AVR.  Cardiac cath on 10/01/13 showed: IMPRESSIONS:  1. Minor luminal irregularities throughout vessels. 2. Minimally elevated right-sided heart pressures. Normal cardiac output. 3. Severe aortic calcification and limitation of aortic valve noted during angiography. (Aortic valve was not crossed). Echo data demonstrated severe stenosis, severe calcification, mild aortic regurgitation, peak velocity of 5.0 m/s, mean gradient of 61 mm mercury.  Echo on 09/05/13 showed: - Left ventricle: The cavity size was normal. There was severe concentric hypertrophy. Systolic function was normal. The estimated ejection fraction was in the range of 60% to 65%. Wall motion was normal; there were no regional wall motion abnormalities. Features are consistent with a pseudonormal left ventricular filling pattern, with concomitant abnormal relaxation and increased filling pressure (grade 2 diastolic dysfunction). - Aortic valve: There was severe stenosis. Mild regurgitation. VTI ratio of LVOT to aortic valve: 0.25. Valve area: 0.86cm^2(VTI). Indexed valve area: 0.47cm^2/m^2 (VTI). Peak velocity ratio of LVOT to aortic valve: 0.22. Valve area: 0.78cm^2 (Vmax). Indexed valve area: 0.43cm^2/m^2 (Vmax). Mean gradient: 36mm Hg (S). Peak gradient: 11mm Hg (S). - Left atrium: The  atrium was moderately to severely dilated. - Atrial septum: No defect or patent foramen ovale was identified.  EKG on 11/14/13 showed SB, LVH with repolarization abnormality.  Carotid duplex on 11/14/13 showed:  - The vertebral arteries appear patent with antegrade flow. - Findings consistent with 1-39 percent stenosis involving the right internal carotid artery and the left internal carotid artery.  CXR on 11/14/13 showed: No active cardiopulmonary disease.  Preoperative labs noted.  AST/ALT 82/102 (previousu AST/ALT were 109/110 on 10/30/13 at [place]).  PLT, PT/PTT WNL.  A1C 5.7.    10/30/13 Hepatitis C Fibrosure testing showed: Fibrosis score 0.73 (H), fibrosis stage F3-4, Necornin film score 0.71 (H). Fibrosis Scoring: <0.21 = Stage F0 - No fibrosis 0.21 - 0.27 = Stage F0 - F1 0.27 - 0.31 = Stage F1 - Portal fibrosis 0.31 - 0.48 = Stage F1 - F2 0.48 - 0.58 = Stage F2 - Bridging fibrosis with few septa 0.58 - 0.72 = Stage F3 - Bridging fibrosis with many septa 0.72 - 0.74 = Stage F3 - F4 >0.74 = Stage F4 - Cirrhosis Necroinflamm Activity Scoring: <0.17 = Grade A0 - No Activity 0.17 - 0.29 = Grade A0 - A1 0.29 - 0.36 = Grade A1 - Minimal activity 0.36 - 0.52 = Grade A1 - A2 0.52 - 0.60 = Grade A2 - Moderate activity 0.60 - 0.62 = Grade A2 - A3 >0.62 = Grade A3 - Severe activity  His AST/ALT are stable since his last [facility] evaluation.  Both Dr. Deatra Ina and Dr. Maceo Pro are aware of plans for AVR.  If no acute changes then I would anticipate that he could proceed as planned.  Myra Gianotti, PA-C [facility] Short Stay Center/Anesthesiology Phone [phone]  11/15/2013 11:50 AM

## 2013-11-18 ENCOUNTER — Encounter (HOSPITAL_COMMUNITY): Payer: Self-pay | Admitting: Certified Registered Nurse Anesthetist

## 2013-11-18 MED ORDER — SODIUM CHLORIDE 0.9 % IV SOLN
INTRAVENOUS | Status: DC
  Filled 2013-11-18: qty 30

## 2013-11-18 MED ORDER — DOPAMINE-DEXTROSE 3.2-5 MG/ML-% IV SOLN
2.0000 ug/kg/min | INTRAVENOUS | Status: DC
  Filled 2013-11-18: qty 250

## 2013-11-18 MED ORDER — SODIUM CHLORIDE 0.9 % IV SOLN
INTRAVENOUS | Status: AC
  Administered 2013-11-19: 1 [IU]/h via INTRAVENOUS
  Filled 2013-11-18: qty 1

## 2013-11-18 MED ORDER — DEXTROSE 5 % IV SOLN
1.5000 g | INTRAVENOUS | Status: AC
  Administered 2013-11-19: 1.5 g via INTRAVENOUS
  Administered 2013-11-19: .75 g via INTRAVENOUS
  Filled 2013-11-18: qty 1.5

## 2013-11-18 MED ORDER — PHENYLEPHRINE HCL 10 MG/ML IJ SOLN
30.0000 ug/min | INTRAVENOUS | Status: AC
  Administered 2013-11-19: 50 ug/min via INTRAVENOUS
  Filled 2013-11-18: qty 2

## 2013-11-18 MED ORDER — EPINEPHRINE HCL 1 MG/ML IJ SOLN
0.5000 ug/min | INTRAVENOUS | Status: DC
  Filled 2013-11-18: qty 4

## 2013-11-18 MED ORDER — NITROGLYCERIN IN D5W 200-5 MCG/ML-% IV SOLN
2.0000 ug/min | INTRAVENOUS | Status: DC
  Filled 2013-11-18: qty 250

## 2013-11-18 MED ORDER — POTASSIUM CHLORIDE 2 MEQ/ML IV SOLN
80.0000 meq | INTRAVENOUS | Status: DC
  Filled 2013-11-18: qty 40

## 2013-11-18 MED ORDER — VANCOMYCIN HCL 10 G IV SOLR
1250.0000 mg | INTRAVENOUS | Status: AC
  Administered 2013-11-19: 1250 mg via INTRAVENOUS
  Filled 2013-11-18: qty 1250

## 2013-11-18 MED ORDER — SODIUM CHLORIDE 0.9 % IV SOLN
INTRAVENOUS | Status: AC
  Administered 2013-11-19: 69.8 mL/h via INTRAVENOUS
  Filled 2013-11-18: qty 40

## 2013-11-18 MED ORDER — MAGNESIUM SULFATE 50 % IJ SOLN
40.0000 meq | INTRAMUSCULAR | Status: DC
  Filled 2013-11-18: qty 10

## 2013-11-18 MED ORDER — DEXTROSE 5 % IV SOLN
750.0000 mg | INTRAVENOUS | Status: DC
  Filled 2013-11-18: qty 750

## 2013-11-18 MED ORDER — PLASMA-LYTE 148 IV SOLN
INTRAVENOUS | Status: DC
  Filled 2013-11-18: qty 2.5

## 2013-11-18 MED ORDER — DEXMEDETOMIDINE HCL IN NACL 400 MCG/100ML IV SOLN
0.1000 ug/kg/h | INTRAVENOUS | Status: AC
  Administered 2013-11-19: 0.3 ug/kg/h via INTRAVENOUS
  Filled 2013-11-18: qty 100

## 2013-11-19 ENCOUNTER — Encounter (HOSPITAL_COMMUNITY)
Admission: RE | Disposition: A | Discharge status: Home / Self Care | Payer: Medicare Other | Source: Ambulatory Visit | Attending: Surgery

## 2013-11-19 ENCOUNTER — Inpatient Hospital Stay (HOSPITAL_COMMUNITY): Payer: Medicare Other | Admitting: Certified Registered Nurse Anesthetist

## 2013-11-19 ENCOUNTER — Inpatient Hospital Stay (HOSPITAL_COMMUNITY)
Admission: RE | Admit: 2013-11-19 | Discharge: 2013-11-24 | DRG: 220 | Disposition: A | Discharge status: Home / Self Care | Payer: Medicare Other | Source: Ambulatory Visit | Attending: Surgery | Admitting: Surgery

## 2013-11-19 ENCOUNTER — Encounter (HOSPITAL_COMMUNITY): Payer: Self-pay | Admitting: *Deleted

## 2013-11-19 ENCOUNTER — Inpatient Hospital Stay (HOSPITAL_COMMUNITY): Payer: Medicare Other

## 2013-11-19 ENCOUNTER — Encounter (HOSPITAL_COMMUNITY): Payer: Medicare Other | Admitting: Vascular Surgery

## 2013-11-19 DIAGNOSIS — I350 Nonrheumatic aortic (valve) stenosis: Secondary | ICD-10-CM | POA: Diagnosis present

## 2013-11-19 DIAGNOSIS — K5900 Constipation, unspecified: Secondary | ICD-10-CM | POA: Diagnosis not present

## 2013-11-19 DIAGNOSIS — Z86718 Personal history of other venous thrombosis and embolism: Secondary | ICD-10-CM

## 2013-11-19 DIAGNOSIS — D696 Thrombocytopenia, unspecified: Secondary | ICD-10-CM | POA: Diagnosis not present

## 2013-11-19 DIAGNOSIS — I2510 Atherosclerotic heart disease of native coronary artery without angina pectoris: Secondary | ICD-10-CM | POA: Diagnosis present

## 2013-11-19 DIAGNOSIS — Z79899 Other long term (current) drug therapy: Secondary | ICD-10-CM

## 2013-11-19 DIAGNOSIS — Z951 Presence of aortocoronary bypass graft: Secondary | ICD-10-CM | POA: Diagnosis not present

## 2013-11-19 DIAGNOSIS — I10 Essential (primary) hypertension: Secondary | ICD-10-CM | POA: Diagnosis present

## 2013-11-19 DIAGNOSIS — Z7982 Long term (current) use of aspirin: Secondary | ICD-10-CM | POA: Diagnosis not present

## 2013-11-19 DIAGNOSIS — R0602 Shortness of breath: Secondary | ICD-10-CM | POA: Diagnosis not present

## 2013-11-19 DIAGNOSIS — J811 Chronic pulmonary edema: Secondary | ICD-10-CM | POA: Diagnosis not present

## 2013-11-19 DIAGNOSIS — B182 Chronic viral hepatitis C: Secondary | ICD-10-CM | POA: Diagnosis present

## 2013-11-19 DIAGNOSIS — I359 Nonrheumatic aortic valve disorder, unspecified: Secondary | ICD-10-CM | POA: Diagnosis present

## 2013-11-19 DIAGNOSIS — Z87891 Personal history of nicotine dependence: Secondary | ICD-10-CM

## 2013-11-19 DIAGNOSIS — D62 Acute posthemorrhagic anemia: Secondary | ICD-10-CM | POA: Diagnosis not present

## 2013-11-19 DIAGNOSIS — J90 Pleural effusion, not elsewhere classified: Secondary | ICD-10-CM | POA: Diagnosis not present

## 2013-11-19 DIAGNOSIS — E785 Hyperlipidemia, unspecified: Secondary | ICD-10-CM | POA: Diagnosis present

## 2013-11-19 DIAGNOSIS — R0989 Other specified symptoms and signs involving the circulatory and respiratory systems: Secondary | ICD-10-CM | POA: Diagnosis not present

## 2013-11-19 DIAGNOSIS — I700 Atherosclerosis of aorta: Secondary | ICD-10-CM | POA: Diagnosis present

## 2013-11-19 DIAGNOSIS — Z888 Allergy status to other drugs, medicaments and biological substances status: Secondary | ICD-10-CM

## 2013-11-19 DIAGNOSIS — E7800 Pure hypercholesterolemia, unspecified: Secondary | ICD-10-CM | POA: Diagnosis present

## 2013-11-19 DIAGNOSIS — Z833 Family history of diabetes mellitus: Secondary | ICD-10-CM

## 2013-11-19 DIAGNOSIS — Z8249 Family history of ischemic heart disease and other diseases of the circulatory system: Secondary | ICD-10-CM

## 2013-11-19 DIAGNOSIS — M1990 Unspecified osteoarthritis, unspecified site: Secondary | ICD-10-CM | POA: Diagnosis not present

## 2013-11-19 DIAGNOSIS — E119 Type 2 diabetes mellitus without complications: Secondary | ICD-10-CM | POA: Diagnosis present

## 2013-11-19 DIAGNOSIS — Z823 Family history of stroke: Secondary | ICD-10-CM | POA: Diagnosis not present

## 2013-11-19 DIAGNOSIS — Z952 Presence of prosthetic heart valve: Secondary | ICD-10-CM

## 2013-11-19 DIAGNOSIS — Z9889 Other specified postprocedural states: Secondary | ICD-10-CM | POA: Diagnosis not present

## 2013-11-19 DIAGNOSIS — I080 Rheumatic disorders of both mitral and aortic valves: Secondary | ICD-10-CM | POA: Diagnosis not present

## 2013-11-19 DIAGNOSIS — R0789 Other chest pain: Secondary | ICD-10-CM | POA: Diagnosis not present

## 2013-11-19 HISTORY — PX: INTRAOPERATIVE TRANSESOPHAGEAL ECHOCARDIOGRAM: SHX5062

## 2013-11-19 HISTORY — PX: AORTIC VALVE REPLACEMENT: SHX41

## 2013-11-19 LAB — CREATININE, SERUM
Creatinine, Ser: 0.77 mg/dL (ref 0.50–1.35)
GFR calc Af Amer: >90 mL/min (ref >90)
GFR calc non Af Amer: 90 mL/min — ABNORMAL LOW (ref >90)

## 2013-11-19 LAB — POCT I-STAT, CHEM 8
BUN: 9 mg/dL (ref 6–23)
BUN: 8 mg/dL (ref 6–23)
BUN: 9 mg/dL (ref 6–23)
BUN: 8 mg/dL (ref 6–23)
BUN: 10 mg/dL (ref 6–23)
BUN: 8 mg/dL (ref 6–23)
CALCIUM ION: 1.11 mmol/L — AB (ref 1.13–1.30)
CHLORIDE: 94 meq/L — AB (ref 96–112)
CHLORIDE: 100 meq/L (ref 96–112)
CREATININE: 0.7 mg/dL (ref 0.50–1.35)
CREATININE: 0.7 mg/dL (ref 0.50–1.35)
Calcium, Ion: 1.27 mmol/L (ref 1.13–1.30)
Calcium, Ion: 1.26 mmol/L (ref 1.13–1.30)
Calcium, Ion: 1.08 mmol/L — ABNORMAL LOW (ref 1.13–1.30)
Calcium, Ion: 1.12 mmol/L — ABNORMAL LOW (ref 1.13–1.30)
Calcium, Ion: 1.24 mmol/L (ref 1.13–1.30)
Chloride: 99 mEq/L (ref 96–112)
Chloride: 101 mEq/L (ref 96–112)
Chloride: 95 mEq/L — ABNORMAL LOW (ref 96–112)
Chloride: 105 mEq/L (ref 96–112)
Creatinine, Ser: 0.6 mg/dL (ref 0.50–1.35)
Creatinine, Ser: 0.6 mg/dL (ref 0.50–1.35)
Creatinine, Ser: 0.5 mg/dL (ref 0.50–1.35)
Creatinine, Ser: 0.8 mg/dL (ref 0.50–1.35)
GLUCOSE: 154 mg/dL — AB (ref 70–99)
GLUCOSE: 157 mg/dL — AB (ref 70–99)
Glucose, Bld: 145 mg/dL — ABNORMAL HIGH (ref 70–99)
Glucose, Bld: 132 mg/dL — ABNORMAL HIGH (ref 70–99)
Glucose, Bld: 156 mg/dL — ABNORMAL HIGH (ref 70–99)
Glucose, Bld: 151 mg/dL — ABNORMAL HIGH (ref 70–99)
HCT: 35 % — ABNORMAL LOW (ref 39.0–52.0)
HCT: 26 % — ABNORMAL LOW (ref 39.0–52.0)
HCT: 25 % — ABNORMAL LOW (ref 39.0–52.0)
HCT: 32 % — ABNORMAL LOW (ref 39.0–52.0)
HEMATOCRIT: 27 % — AB (ref 39.0–52.0)
HEMATOCRIT: 33 % — AB (ref 39.0–52.0)
HEMOGLOBIN: 11.9 g/dL — AB (ref 13.0–17.0)
HEMOGLOBIN: 11.2 g/dL — AB (ref 13.0–17.0)
HEMOGLOBIN: 9.2 g/dL — AB (ref 13.0–17.0)
Hemoglobin: 8.8 g/dL — ABNORMAL LOW (ref 13.0–17.0)
Hemoglobin: 8.5 g/dL — ABNORMAL LOW (ref 13.0–17.0)
Hemoglobin: 10.9 g/dL — ABNORMAL LOW (ref 13.0–17.0)
POTASSIUM: 3.7 meq/L (ref 3.7–5.3)
POTASSIUM: 3.9 meq/L (ref 3.7–5.3)
Potassium: 3.4 mEq/L — ABNORMAL LOW (ref 3.7–5.3)
Potassium: 3.1 mEq/L — ABNORMAL LOW (ref 3.7–5.3)
Potassium: 3.2 mEq/L — ABNORMAL LOW (ref 3.7–5.3)
Potassium: 4.7 mEq/L (ref 3.7–5.3)
SODIUM: 135 meq/L — AB (ref 137–147)
Sodium: 140 mEq/L (ref 137–147)
Sodium: 139 mEq/L (ref 137–147)
Sodium: 139 mEq/L (ref 137–147)
Sodium: 133 mEq/L — ABNORMAL LOW (ref 137–147)
Sodium: 141 mEq/L (ref 137–147)
TCO2: 27 mmol/L (ref 0–100)
TCO2: 26 mmol/L (ref 0–100)
TCO2: 27 mmol/L (ref 0–100)
TCO2: 25 mmol/L (ref 0–100)
TCO2: 25 mmol/L (ref 0–100)
TCO2: 24 mmol/L (ref 0–100)

## 2013-11-19 LAB — HEMOGLOBIN AND HEMATOCRIT, BLOOD
HCT: 24.1 % — ABNORMAL LOW (ref 39.0–52.0)
Hemoglobin: 8.3 g/dL — ABNORMAL LOW (ref 13.0–17.0)

## 2013-11-19 LAB — CBC
HCT: 29.7 % — ABNORMAL LOW (ref 39.0–52.0)
HCT: 29.8 % — ABNORMAL LOW (ref 39.0–52.0)
HEMOGLOBIN: 9.9 g/dL — AB (ref 13.0–17.0)
Hemoglobin: 10.1 g/dL — ABNORMAL LOW (ref 13.0–17.0)
MCH: 30.1 pg (ref 26.0–34.0)
MCH: 29.6 pg (ref 26.0–34.0)
MCHC: 34 g/dL (ref 30.0–36.0)
MCHC: 33.2 g/dL (ref 30.0–36.0)
MCV: 88.7 fL (ref 78.0–100.0)
MCV: 89.2 fL (ref 78.0–100.0)
Platelets: 114 10*3/uL — ABNORMAL LOW (ref 150–400)
Platelets: 128 10*3/uL — ABNORMAL LOW (ref 150–400)
RBC: 3.35 MIL/uL — AB (ref 4.22–5.81)
RBC: 3.34 MIL/uL — ABNORMAL LOW (ref 4.22–5.81)
RDW: 14.8 % (ref 11.5–15.5)
RDW: 15 % (ref 11.5–15.5)
WBC: 6.5 10*3/uL (ref 4.0–10.5)
WBC: 7.1 10*3/uL (ref 4.0–10.5)

## 2013-11-19 LAB — POCT I-STAT 4, (NA,K, GLUC, HGB,HCT)
Glucose, Bld: 100 mg/dL — ABNORMAL HIGH (ref 70–99)
HEMATOCRIT: 34 % — AB (ref 39.0–52.0)
Hemoglobin: 11.6 g/dL — ABNORMAL LOW (ref 13.0–17.0)
POTASSIUM: 3.4 meq/L — AB (ref 3.7–5.3)
SODIUM: 135 meq/L — AB (ref 137–147)

## 2013-11-19 LAB — POCT I-STAT 3, ART BLOOD GAS (G3+)
Acid-Base Excess: 2 mmol/L (ref 0.0–2.0)
Acid-Base Excess: 1 mmol/L (ref 0.0–2.0)
BICARBONATE: 27.5 meq/L — AB (ref 20.0–24.0)
BICARBONATE: 26.8 meq/L — AB (ref 20.0–24.0)
Bicarbonate: 26 mEq/L — ABNORMAL HIGH (ref 20.0–24.0)
O2 SAT: 99 %
O2 Saturation: 100 %
O2 Saturation: 99 %
PH ART: 7.393 (ref 7.350–7.450)
Patient temperature: 35.3
TCO2: 29 mmol/L (ref 0–100)
TCO2: 27 mmol/L (ref 0–100)
TCO2: 28 mmol/L (ref 0–100)
pCO2 arterial: 45 mmHg (ref 35.0–45.0)
pCO2 arterial: 37.9 mmHg (ref 35.0–45.0)
pCO2 arterial: 54.5 mmHg — ABNORMAL HIGH (ref 35.0–45.0)
pH, Arterial: 7.437 (ref 7.350–7.450)
pH, Arterial: 7.303 — ABNORMAL LOW (ref 7.350–7.450)
pO2, Arterial: 229 mmHg — ABNORMAL HIGH (ref 80.0–100.0)
pO2, Arterial: 141 mmHg — ABNORMAL HIGH (ref 80.0–100.0)
pO2, Arterial: 141 mmHg — ABNORMAL HIGH (ref 80.0–100.0)

## 2013-11-19 LAB — PLATELET COUNT: PLATELETS: 96 10*3/uL — AB (ref 150–400)

## 2013-11-19 LAB — GLUCOSE, CAPILLARY: GLUCOSE-CAPILLARY: 138 mg/dL — AB (ref 70–99)

## 2013-11-19 LAB — PROTIME-INR
INR: 1.53 — ABNORMAL HIGH (ref 0.00–1.49)
Prothrombin Time: 18.4 seconds — ABNORMAL HIGH (ref 11.6–15.2)

## 2013-11-19 LAB — MAGNESIUM: Magnesium: 3 mg/dL — ABNORMAL HIGH (ref 1.5–2.5)

## 2013-11-19 LAB — APTT: APTT: 48 s — AB (ref 24–37)

## 2013-11-19 SURGERY — REPLACEMENT, AORTIC VALVE, OPEN
Anesthesia: General | Site: Chest

## 2013-11-19 MED ORDER — THROMBIN 20000 UNITS EX SOLR
CUTANEOUS | Status: DC | PRN
  Administered 2013-11-19: 20000 [IU] via TOPICAL

## 2013-11-19 MED ORDER — THROMBIN 20000 UNITS EX SOLR
OROMUCOSAL | Status: DC | PRN
  Administered 2013-11-19 (×3): via TOPICAL

## 2013-11-19 MED ORDER — HEPARIN SODIUM (PORCINE) 1000 UNIT/ML IJ SOLN
INTRAMUSCULAR | Status: AC
  Filled 2013-11-19: qty 1

## 2013-11-19 MED ORDER — ASPIRIN 81 MG PO CHEW: 324.0000 mg | CHEWABLE_TABLET | Freq: Every day | ORAL | Status: DC

## 2013-11-19 MED ORDER — METOPROLOL TARTRATE 12.5 MG HALF TABLET
12.5000 mg | ORAL_TABLET | Freq: Two times a day (BID) | ORAL | Status: DC
  Administered 2013-11-20: 12.5 mg via ORAL
  Filled 2013-11-19 (×5): qty 1

## 2013-11-19 MED ORDER — FENTANYL CITRATE 0.05 MG/ML IJ SOLN
INTRAMUSCULAR | Status: AC
  Filled 2013-11-19: qty 5

## 2013-11-19 MED ORDER — INSULIN REGULAR HUMAN 100 UNIT/ML IJ SOLN
INTRAMUSCULAR | Status: DC
  Administered 2013-11-19: 1.6 [IU]/h via INTRAVENOUS
  Filled 2013-11-19: qty 1

## 2013-11-19 MED ORDER — INSULIN REGULAR BOLUS VIA INFUSION
0.0000 [IU] | Freq: Three times a day (TID) | INTRAVENOUS | Status: DC
  Filled 2013-11-19: qty 10

## 2013-11-19 MED ORDER — METOPROLOL TARTRATE 12.5 MG HALF TABLET: 12.5000 mg | ORAL_TABLET | Freq: Once | ORAL | Status: DC

## 2013-11-19 MED ORDER — SODIUM CHLORIDE 0.9 % IJ SOLN
INTRAMUSCULAR | Status: AC
  Filled 2013-11-19: qty 10

## 2013-11-19 MED ORDER — PROPOFOL 10 MG/ML IV BOLUS
INTRAVENOUS | Status: AC
  Filled 2013-11-19: qty 20

## 2013-11-19 MED ORDER — NITROGLYCERIN 0.2 MG/ML ON CALL CATH LAB
INTRAVENOUS | Status: DC | PRN
  Administered 2013-11-19 (×2): 80 ug via INTRAVENOUS

## 2013-11-19 MED ORDER — FENTANYL CITRATE 0.05 MG/ML IJ SOLN
INTRAMUSCULAR | Status: DC | PRN
  Administered 2013-11-19: 250 ug via INTRAVENOUS
  Administered 2013-11-19: 200 ug via INTRAVENOUS
  Administered 2013-11-19: 50 ug via INTRAVENOUS
  Administered 2013-11-19 (×3): 250 ug via INTRAVENOUS
  Administered 2013-11-19: 1000 ug via INTRAVENOUS

## 2013-11-19 MED ORDER — DOCUSATE SODIUM 100 MG PO CAPS
200.0000 mg | ORAL_CAPSULE | Freq: Every day | ORAL | Status: DC
  Administered 2013-11-20 – 2013-11-21 (×2): 200 mg via ORAL
  Filled 2013-11-19 (×2): qty 2

## 2013-11-19 MED ORDER — ARTIFICIAL TEARS OP OINT
TOPICAL_OINTMENT | OPHTHALMIC | Status: DC | PRN
  Administered 2013-11-19: 1 via OPHTHALMIC

## 2013-11-19 MED ORDER — ACETAMINOPHEN 160 MG/5ML PO SOLN: 650.0000 mg | Freq: Once | ORAL | Status: AC

## 2013-11-19 MED ORDER — DEXTROSE 50 % IV SOLN
INTRAVENOUS | Status: AC
  Administered 2013-11-19: 20 mL
  Filled 2013-11-19: qty 50

## 2013-11-19 MED ORDER — OXYCODONE HCL 5 MG PO TABS
5.0000 mg | ORAL_TABLET | ORAL | Status: DC | PRN
  Administered 2013-11-20 (×3): 5 mg via ORAL
  Administered 2013-11-21 (×2): 10 mg via ORAL
  Filled 2013-11-19 (×2): qty 1
  Filled 2013-11-19: qty 2
  Filled 2013-11-19: qty 1
  Filled 2013-11-19: qty 2

## 2013-11-19 MED ORDER — ROCURONIUM BROMIDE 50 MG/5ML IV SOLN
INTRAVENOUS | Status: AC
  Filled 2013-11-19: qty 1

## 2013-11-19 MED ORDER — SODIUM CHLORIDE 0.9 % IV SOLN: 250.0000 mL | INTRAVENOUS | Status: DC

## 2013-11-19 MED ORDER — ACETAMINOPHEN 160 MG/5ML PO SOLN
1000.0000 mg | Freq: Four times a day (QID) | ORAL | Status: DC
  Filled 2013-11-19: qty 40

## 2013-11-19 MED ORDER — DEXTROSE 5 % IV SOLN
1.5000 g | Freq: Two times a day (BID) | INTRAVENOUS | Status: AC
  Administered 2013-11-19 – 2013-11-21 (×4): 1.5 g via INTRAVENOUS
  Filled 2013-11-19 (×4): qty 1.5

## 2013-11-19 MED ORDER — POTASSIUM CHLORIDE 10 MEQ/50ML IV SOLN
10.0000 meq | INTRAVENOUS | Status: AC
  Administered 2013-11-19 (×3): 10 meq via INTRAVENOUS

## 2013-11-19 MED ORDER — MORPHINE SULFATE 2 MG/ML IJ SOLN
2.0000 mg | INTRAMUSCULAR | Status: DC | PRN
  Administered 2013-11-21: 2 mg via INTRAVENOUS
  Filled 2013-11-19 (×3): qty 1

## 2013-11-19 MED ORDER — METOPROLOL TARTRATE 25 MG/10 ML ORAL SUSPENSION
12.5000 mg | Freq: Two times a day (BID) | ORAL | Status: DC
  Filled 2013-11-19 (×5): qty 5

## 2013-11-19 MED ORDER — NITROGLYCERIN IN D5W 200-5 MCG/ML-% IV SOLN
0.0000 ug/min | INTRAVENOUS | Status: DC
  Administered 2013-11-19: 5 ug/min via INTRAVENOUS

## 2013-11-19 MED ORDER — LACTATED RINGERS IV SOLN
INTRAVENOUS | Status: DC
Start: 2013-11-19 — End: 2013-11-21
  Administered 2013-11-19: 20 mL/h via INTRAVENOUS

## 2013-11-19 MED ORDER — CHLORHEXIDINE GLUCONATE CLOTH 2 % EX PADS
6.0000 | MEDICATED_PAD | Freq: Every day | CUTANEOUS | Status: DC
  Administered 2013-11-20 – 2013-11-24 (×5): 6 via TOPICAL

## 2013-11-19 MED ORDER — PROPOFOL 10 MG/ML IV BOLUS
INTRAVENOUS | Status: DC | PRN
  Administered 2013-11-19: 50 mg via INTRAVENOUS

## 2013-11-19 MED ORDER — ONDANSETRON HCL 4 MG/2ML IJ SOLN
4.0000 mg | Freq: Four times a day (QID) | INTRAMUSCULAR | Status: DC | PRN
  Administered 2013-11-20: 4 mg via INTRAVENOUS
  Filled 2013-11-19: qty 2

## 2013-11-19 MED ORDER — ATORVASTATIN CALCIUM 20 MG PO TABS
20.0000 mg | ORAL_TABLET | Freq: Every day | ORAL | Status: DC
  Administered 2013-11-20 – 2013-11-23 (×4): 20 mg via ORAL
  Filled 2013-11-19 (×6): qty 1

## 2013-11-19 MED ORDER — ASPIRIN EC 325 MG PO TBEC
325.0000 mg | DELAYED_RELEASE_TABLET | Freq: Every day | ORAL | Status: DC
  Administered 2013-11-20 – 2013-11-21 (×2): 325 mg via ORAL
  Filled 2013-11-19 (×2): qty 1

## 2013-11-19 MED ORDER — LACTATED RINGERS IV SOLN: 500.0000 mL | Freq: Once | INTRAVENOUS | Status: AC | PRN

## 2013-11-19 MED ORDER — PHENYLEPHRINE HCL 10 MG/ML IJ SOLN
0.0000 ug/min | INTRAVENOUS | Status: DC
  Administered 2013-11-19: 5 ug/min via INTRAVENOUS
  Filled 2013-11-19: qty 2

## 2013-11-19 MED ORDER — MIDAZOLAM HCL 2 MG/2ML IJ SOLN: 2.0000 mg | INTRAMUSCULAR | Status: DC | PRN

## 2013-11-19 MED ORDER — MIDAZOLAM HCL 10 MG/2ML IJ SOLN
INTRAMUSCULAR | Status: AC
  Filled 2013-11-19: qty 2

## 2013-11-19 MED ORDER — ROCURONIUM BROMIDE 100 MG/10ML IV SOLN
INTRAVENOUS | Status: DC | PRN
  Administered 2013-11-19 (×3): 50 mg via INTRAVENOUS

## 2013-11-19 MED ORDER — POTASSIUM CHLORIDE 10 MEQ/50ML IV SOLN
10.0000 meq | Freq: Once | INTRAVENOUS | Status: AC
  Administered 2013-11-19: 10 meq via INTRAVENOUS

## 2013-11-19 MED ORDER — BISACODYL 10 MG RE SUPP: 10.0000 mg | Freq: Every day | RECTAL | Status: DC

## 2013-11-19 MED ORDER — LACTATED RINGERS IV SOLN
INTRAVENOUS | Status: DC | PRN
  Administered 2013-11-19: 07:00:00 via INTRAVENOUS

## 2013-11-19 MED ORDER — VANCOMYCIN HCL IN DEXTROSE 1-5 GM/200ML-% IV SOLN
1000.0000 mg | Freq: Once | INTRAVENOUS | Status: AC
  Administered 2013-11-19: 1000 mg via INTRAVENOUS
  Filled 2013-11-19: qty 200

## 2013-11-19 MED ORDER — METOPROLOL TARTRATE 1 MG/ML IV SOLN: 2.5000 mg | INTRAVENOUS | Status: DC | PRN

## 2013-11-19 MED ORDER — PROTAMINE SULFATE 10 MG/ML IV SOLN
INTRAVENOUS | Status: AC
  Filled 2013-11-19: qty 25

## 2013-11-19 MED ORDER — GLYCOPYRROLATE 0.2 MG/ML IJ SOLN
INTRAMUSCULAR | Status: AC
  Filled 2013-11-19: qty 2

## 2013-11-19 MED ORDER — SODIUM CHLORIDE 0.9 % IJ SOLN
3.0000 mL | Freq: Two times a day (BID) | INTRAMUSCULAR | Status: DC
  Administered 2013-11-20: 3 mL via INTRAVENOUS

## 2013-11-19 MED ORDER — ALBUMIN HUMAN 5 % IV SOLN
250.0000 mL | INTRAVENOUS | Status: AC | PRN
  Administered 2013-11-19 (×4): 250 mL via INTRAVENOUS
  Filled 2013-11-19: qty 250

## 2013-11-19 MED ORDER — SODIUM CHLORIDE 0.45 % IV SOLN
INTRAVENOUS | Status: DC
  Administered 2013-11-19: 20 mL/h via INTRAVENOUS

## 2013-11-19 MED ORDER — MIDAZOLAM HCL 5 MG/5ML IJ SOLN
INTRAMUSCULAR | Status: DC | PRN
  Administered 2013-11-19: 3 mg via INTRAVENOUS
  Administered 2013-11-19: 2 mg via INTRAVENOUS
  Administered 2013-11-19: 3 mg via INTRAVENOUS
  Administered 2013-11-19: 2 mg via INTRAVENOUS

## 2013-11-19 MED ORDER — ACETAMINOPHEN 500 MG PO TABS
1000.0000 mg | ORAL_TABLET | Freq: Four times a day (QID) | ORAL | Status: DC
  Administered 2013-11-20 – 2013-11-21 (×5): 1000 mg via ORAL
  Filled 2013-11-19 (×9): qty 2

## 2013-11-19 MED ORDER — HYDROCORTISONE ACETATE 25 MG RE SUPP
25.0000 mg | Freq: Every day | RECTAL | Status: DC
  Filled 2013-11-19 (×5): qty 1

## 2013-11-19 MED ORDER — SODIUM CHLORIDE 0.9 % IJ SOLN: 3.0000 mL | INTRAMUSCULAR | Status: DC | PRN

## 2013-11-19 MED ORDER — HEMOSTATIC AGENTS (NO CHARGE) OPTIME
TOPICAL | Status: DC | PRN
  Administered 2013-11-19: 1 via TOPICAL

## 2013-11-19 MED ORDER — HEPARIN SODIUM (PORCINE) 1000 UNIT/ML IJ SOLN
INTRAMUSCULAR | Status: DC | PRN
  Administered 2013-11-19: 20000 [IU] via INTRAVENOUS

## 2013-11-19 MED ORDER — PROTAMINE SULFATE 10 MG/ML IV SOLN
INTRAVENOUS | Status: DC | PRN
  Administered 2013-11-19: 240 mg via INTRAVENOUS

## 2013-11-19 MED ORDER — MUPIROCIN 2 % EX OINT
TOPICAL_OINTMENT | Freq: Two times a day (BID) | CUTANEOUS | Status: DC
  Administered 2013-11-19 – 2013-11-21 (×5): via NASAL
  Administered 2013-11-22: 1 via NASAL
  Administered 2013-11-22: 10:00:00 via NASAL
  Administered 2013-11-23: 1 via NASAL
  Administered 2013-11-23: 11:00:00 via NASAL
  Filled 2013-11-19 (×2): qty 22

## 2013-11-19 MED ORDER — BISACODYL 5 MG PO TBEC
10.0000 mg | DELAYED_RELEASE_TABLET | Freq: Every day | ORAL | Status: DC
  Administered 2013-11-20 – 2013-11-21 (×2): 10 mg via ORAL
  Filled 2013-11-19 (×2): qty 2

## 2013-11-19 MED ORDER — MORPHINE SULFATE 2 MG/ML IJ SOLN
1.0000 mg | INTRAMUSCULAR | Status: AC | PRN
  Administered 2013-11-19: 2 mg via INTRAVENOUS

## 2013-11-19 MED ORDER — SODIUM CHLORIDE 0.9 % IV SOLN
INTRAVENOUS | Status: DC
  Administered 2013-11-19: 20 mL/h via INTRAVENOUS
  Administered 2013-11-19: 10 mL via INTRAVENOUS

## 2013-11-19 MED ORDER — MAGNESIUM SULFATE 4000MG/100ML IJ SOLN
4.0000 g | Freq: Once | INTRAMUSCULAR | Status: AC
  Administered 2013-11-19: 4 g via INTRAVENOUS
  Filled 2013-11-19: qty 100

## 2013-11-19 MED ORDER — DEXMEDETOMIDINE HCL IN NACL 200 MCG/50ML IV SOLN
0.1000 ug/kg/h | INTRAVENOUS | Status: DC
  Administered 2013-11-19: 0.5 ug/kg/h via INTRAVENOUS

## 2013-11-19 MED ORDER — PANTOPRAZOLE SODIUM 40 MG PO TBEC
40.0000 mg | DELAYED_RELEASE_TABLET | Freq: Every day | ORAL | Status: DC
  Administered 2013-11-21: 40 mg via ORAL
  Filled 2013-11-19: qty 1

## 2013-11-19 MED ORDER — GLYCOPYRROLATE 0.2 MG/ML IJ SOLN
INTRAMUSCULAR | Status: DC | PRN
  Administered 2013-11-19: 0.4 mg via INTRAVENOUS

## 2013-11-19 MED ORDER — ALBUMIN HUMAN 5 % IV SOLN
INTRAVENOUS | Status: DC | PRN
  Administered 2013-11-19: 11:00:00 via INTRAVENOUS

## 2013-11-19 MED ORDER — ACETAMINOPHEN 650 MG RE SUPP
650.0000 mg | Freq: Once | RECTAL | Status: AC
Start: 2013-11-19 — End: 2013-11-19
  Administered 2013-11-19: 650 mg via RECTAL

## 2013-11-19 MED ORDER — DEXMEDETOMIDINE HCL IN NACL 400 MCG/100ML IV SOLN
0.4000 ug/kg/h | INTRAVENOUS | Status: DC
  Filled 2013-11-19: qty 100

## 2013-11-19 MED ORDER — ROCURONIUM BROMIDE 50 MG/5ML IV SOLN
INTRAVENOUS | Status: AC
  Filled 2013-11-19: qty 2

## 2013-11-19 MED ORDER — FAMOTIDINE IN NACL 20-0.9 MG/50ML-% IV SOLN
20.0000 mg | Freq: Two times a day (BID) | INTRAVENOUS | Status: AC
  Administered 2013-11-19 (×2): 20 mg via INTRAVENOUS
  Filled 2013-11-19: qty 50

## 2013-11-19 MED ORDER — THROMBIN 20000 UNITS EX SOLR
CUTANEOUS | Status: AC
  Filled 2013-11-19: qty 20000

## 2013-11-19 MED ORDER — DEXTROSE 50 % IV SOLN
20.0000 mL | Freq: Once | INTRAVENOUS | Status: AC
  Administered 2013-11-19: 20 mL via INTRAVENOUS

## 2013-11-19 MED ORDER — CHLORHEXIDINE GLUCONATE 4 % EX LIQD
30.0000 mL | CUTANEOUS | Status: DC
  Filled 2013-11-19: qty 30

## 2013-11-19 MED FILL — Heparin Sodium (Porcine) Inj 1000 Unit/ML: INTRAMUSCULAR | Qty: 10 | Status: AC

## 2013-11-19 MED FILL — Mannitol IV Soln 20%: INTRAVENOUS | Qty: 500 | Status: AC

## 2013-11-19 MED FILL — Sodium Bicarbonate IV Soln 8.4%: INTRAVENOUS | Qty: 50 | Status: AC

## 2013-11-19 MED FILL — Electrolyte-R (PH 7.4) Solution: INTRAVENOUS | Qty: 3000 | Status: AC

## 2013-11-19 MED FILL — Lidocaine HCl IV Inj 20 MG/ML: INTRAVENOUS | Qty: 10 | Status: AC

## 2013-11-19 MED FILL — Sodium Chloride IV Soln 0.9%: INTRAVENOUS | Qty: 2000 | Status: AC

## 2013-11-19 SURGICAL SUPPLY — 67 items
ADAPTER CARDIO PERF ANTE/RETRO (ADAPTER) ×4 IMPLANT
ADPR PRFSN 84XANTGRD RTRGD (ADAPTER) ×1
ATTRACTOMAT 16X20 MAGNETIC DRP (DRAPES) ×4 IMPLANT
BAG DECANTER FOR FLEXI CONT (MISCELLANEOUS) ×4 IMPLANT
BLADE STERNUM SYSTEM 6 (BLADE) ×4 IMPLANT
BLADE SURG 15 STRL LF DISP TIS (BLADE) ×2 IMPLANT
BLADE SURG 15 STRL SS (BLADE) ×2
CANISTER SUCTION 2500CC (MISCELLANEOUS) ×4 IMPLANT
CANNULA GUNDRY RCSP 15FR (MISCELLANEOUS) ×4 IMPLANT
CATH ROBINSON RED A/P 18FR (CATHETERS) ×12 IMPLANT
CATH THORACIC 36FR (CATHETERS) ×4 IMPLANT
CATH THORACIC 36FR RT ANG (CATHETERS) ×4 IMPLANT
CONT SPEC 4OZ CLIKSEAL STRL BL (MISCELLANEOUS) ×4 IMPLANT
CONT SPEC STER OR (MISCELLANEOUS) ×4 IMPLANT
COVER SURGICAL LIGHT HANDLE (MISCELLANEOUS) ×4 IMPLANT
CRADLE DONUT ADULT HEAD (MISCELLANEOUS) ×4 IMPLANT
DRAPE SLUSH/WARMER DISC (DRAPES) ×4 IMPLANT
DRSG COVADERM 4X14 (GAUZE/BANDAGES/DRESSINGS) ×4 IMPLANT
ELECT CAUTERY BLADE 6.4 (BLADE) ×4 IMPLANT
ELECT REM PT RETURN 9FT ADLT (ELECTROSURGICAL) ×8
ELECTRODE REM PT RTRN 9FT ADLT (ELECTROSURGICAL) ×4 IMPLANT
GLOVE BIO SURGEON STRL SZ 6 (GLOVE) IMPLANT
GLOVE BIO SURGEON STRL SZ 6.5 (GLOVE) IMPLANT
GLOVE BIO SURGEON STRL SZ7 (GLOVE) IMPLANT
GLOVE BIO SURGEON STRL SZ7.5 (GLOVE) IMPLANT
GLOVE BIO SURGEON STRL SZ8 (GLOVE) ×4 IMPLANT
GLOVE BIO SURGEONS STRL SZ 6.5 (GLOVE)
GLOVE BIOGEL PI IND STRL 6.5 (GLOVE) ×8 IMPLANT
GLOVE BIOGEL PI IND STRL 7.0 (GLOVE) ×2 IMPLANT
GLOVE BIOGEL PI INDICATOR 6.5 (GLOVE) ×8
GLOVE BIOGEL PI INDICATOR 7.0 (GLOVE) ×2
GLOVE EUDERMIC 7 POWDERFREE (GLOVE) ×8 IMPLANT
GLOVE SURG SIGNA 7.5 PF LTX (GLOVE) ×4 IMPLANT
GOWN STRL REUS W/ TWL LRG LVL3 (GOWN DISPOSABLE) ×20 IMPLANT
GOWN STRL REUS W/ TWL XL LVL3 (GOWN DISPOSABLE) ×2 IMPLANT
GOWN STRL REUS W/TWL LRG LVL3 (GOWN DISPOSABLE) ×20
GOWN STRL REUS W/TWL XL LVL3 (GOWN DISPOSABLE) ×2
HEART VENT LT CURVED (MISCELLANEOUS) ×4 IMPLANT
HEMOSTAT POWDER SURGIFOAM 1G (HEMOSTASIS) ×12 IMPLANT
HEMOSTAT SURGICEL 2X14 (HEMOSTASIS) ×4 IMPLANT
KIT BASIN OR (CUSTOM PROCEDURE TRAY) ×4 IMPLANT
KIT CATH CPB BARTLE (MISCELLANEOUS) ×4 IMPLANT
KIT ROOM TURNOVER OR (KITS) ×4 IMPLANT
KIT SUCTION CATH 14FR (SUCTIONS) ×4 IMPLANT
LINE VENT (MISCELLANEOUS) ×4 IMPLANT
NS IRRIG 1000ML POUR BTL (IV SOLUTION) ×24 IMPLANT
PACK OPEN HEART (CUSTOM PROCEDURE TRAY) ×4 IMPLANT
PAD ARMBOARD 7.5X6 YLW CONV (MISCELLANEOUS) ×8 IMPLANT
SET CARDIOPLEGIA MPS 5001102 (MISCELLANEOUS) ×4 IMPLANT
SPONGE GAUZE 4X4 12PLY (GAUZE/BANDAGES/DRESSINGS) ×4 IMPLANT
SUT BONE WAX W31G (SUTURE) ×4 IMPLANT
SUT ETHIBON 2 0 V 52N 30 (SUTURE) ×8 IMPLANT
SUT PROLENE 3 0 SH DA (SUTURE) IMPLANT
SUT PROLENE 3 0 SH1 36 (SUTURE) ×4 IMPLANT
SUT PROLENE 4 0 RB 1 (SUTURE) ×6
SUT PROLENE 4-0 RB1 .5 CRCL 36 (SUTURE) ×6 IMPLANT
SUT STEEL 6MS V (SUTURE) ×8 IMPLANT
SUT VIC AB 1 CTX 36 (SUTURE) ×9
SUT VIC AB 1 CTX36XBRD ANBCTR (SUTURE) ×6 IMPLANT
SUTURE E-PAK OPEN HEART (SUTURE) ×4 IMPLANT
SYSTEM SAHARA CHEST DRAIN ATS (WOUND CARE) ×4 IMPLANT
TOWEL OR 17X24 6PK STRL BLUE (TOWEL DISPOSABLE) ×8 IMPLANT
TOWEL OR 17X26 10 PK STRL BLUE (TOWEL DISPOSABLE) ×8 IMPLANT
TRAY FOLEY IC TEMP SENS 16FR (CATHETERS) ×4 IMPLANT
UNDERPAD 30X30 INCONTINENT (UNDERPADS AND DIAPERS) ×4 IMPLANT
VALVE MAGNA EASE 21MM (Prosthesis & Implant Heart) ×4 IMPLANT
WATER STERILE IRR 1000ML POUR (IV SOLUTION) ×8 IMPLANT

## 2013-11-19 NOTE — Transfer of Care (Signed)
Immediate Anesthesia Transfer of Care Note  Patient: [name]  Procedure(s) Performed: Procedure(s): AORTIC VALVE REPLACEMENT (AVR) (N/A) INTRAOPERATIVE TRANSESOPHAGEAL ECHOCARDIOGRAM (N/A)  Patient Location: SICU  Anesthesia Type:General  Level of Consciousness: sedated, unresponsive and Patient remains intubated per anesthesia plan  Airway & Oxygen Therapy: Patient remains intubated per anesthesia plan and Patient placed on Ventilator (see vital sign flow sheet for setting)  Post-op Assessment: Report given to PACU RN and Post -op Vital signs reviewed and stable  Post vital signs: Reviewed and stable  Complications: No apparent anesthesia complications

## 2013-11-19 NOTE — H&P (Signed)
[address]       [place] [address]             [phone]      Cardiothoracic Surgery History and Physical   PCP is Lujean Amel, MD  Referring Provider is Candee Furbish, MD  Chief Complaint   Patient presents with   .  Aortic Stenosis        HPI:   The patient is a 73 year old gentleman with a history of diabetes, hypertension, and hepatitis C s/p antiviral treatment in 2003 at [facility] who underwent catheterization in June 2010 after an abnormal nuclear stress test. He had been having some vague chest discomfort. The cath showed mild, nonobstructive CAD with and EF of 75% and a hyperdynamic LV. There was no gradient across the aortic valve. His echo at that time had shown a mean gradient of 25 mm Hg across the aortic valve. A follow up echo in 08/2012 showed severe AS with a peak velocity of 3.7 m/sec. A follow up echo on 09/05/2013 showed severe AS with a mean gradient of 61 mm Hg and a peak gradient of 100 mm Hg. The AVA was 0.86 cm2. The aorta was normal sized. He underwent cardiac cath on 10/01/2013 which showed no significant coronary disease and minimally elevated right heart pressures with normal cardiac output.He currently denies any symptoms of dyspnea, fatigue, chest pain or dizziness but says he is not very active due to chronic left shoulder pain.   Past Medical History   Diagnosis  Date   .  Essential hypertension, benign    .  Diabetes mellitus without complication    .  Hep C w/o coma, chronic    .  Hypercholesterolemia    .  ED (erectile dysfunction)    .  History of DVT of lower extremity    .  Acute epididymo-orchitis    .  Aortic valve disorders     Past Surgical History   Procedure  Laterality  Date   .  Laminectomy   06/2005     Dr Joya Salm. multiple lumbar level laminectomies.   .  Knee arthroscopy  Left  05/2009     Dr Collier Salina   .  Tee without cardioversion   11/2003     Aoritic valve sclerosis.   .  Colonoscopy   01/2010    internal hemorrhoids. Dr Deatra Ina   .  Esophagogastroduodenoscopy  N/A  04/24/2013     Procedure: ESOPHAGOGASTRODUODENOSCOPY (EGD); Surgeon: Jerene Bears, MD; Location: [facility] ENDOSCOPY; Service: Endoscopy; Laterality: N/A;   .  Bunionectomy  Bilateral  2008   .  Inguinal hernia repair  Right     Family History   Problem  Relation  Age of Onset   .  Diabetes  Father    .  Stroke  Father    .  Diabetes  Mother    .  Stroke  Mother    .  Hypertension       family history   Social History  History   Substance Use Topics   .  Smoking status:  Former Smoker     Types:  Cigarettes     Quit date:  09/26/1986   .  Smokeless tobacco:  Never Used   .  Alcohol Use:  Yes    Current Outpatient Prescriptions   Medication  Sig  Dispense  Refill   .  amLODipine (NORVASC) 10 MG tablet  Take  10 mg by mouth daily.     Marland Kitchen  atorvastatin (LIPITOR) 20 MG tablet  Take 20 mg by mouth at bedtime.     .  ferrous sulfate 325 (65 FE) MG tablet  Take 1 tablet (325 mg total) by mouth 2 (two) times daily with a meal.  60 tablet  3   .  lisinopril-hydrochlorothiazide (PRINZIDE,ZESTORETIC) 20-25 MG per tablet  Take 1 tablet by mouth daily.     .  metFORMIN (GLUCOPHAGE) 500 MG tablet  Take 1,000-1,500 mg by mouth 2 (two) times daily with a meal. Take 2 tabs in the morning and 3 tabs in the evening     .  metoprolol succinate (TOPROL-XL) 25 MG 24 hr tablet  Take 25 mg by mouth daily.     .  pantoprazole (PROTONIX) 40 MG tablet  Take 40 mg by mouth daily.      No current facility-administered medications for this visit.    Allergies   Allergen  Reactions   .  Crestor [Rosuvastatin]      Discomfort, RESTLESS, CAN'T SLEEP   .  Vytorin [Ezetimibe-Simvastatin]      Leg pains    Review of Systems   Constitutional: Negative for fever, activity change, appetite change, fatigue and unexpected weight change.  HENT: Negative. Negative for dental problem.  Sees his dentist regularly  Eyes: Negative.  Respiratory: Negative  for shortness of breath.  Cardiovascular: Negative for chest pain, palpitations and leg swelling.  Gastrointestinal: Positive for blood in stool. Negative for nausea, vomiting, abdominal pain, diarrhea, constipation, abdominal distention, anal bleeding and rectal pain.  Endocrine: Negative.  Genitourinary: Negative.  Musculoskeletal: Positive for arthralgias.  Particularly left shoulder  Skin: Negative.  Allergic/Immunologic: Negative.  Neurological: Negative for dizziness and syncope.  Hematological: Negative.  Psychiatric/Behavioral: Negative.   BP 146/81  Pulse 79  Resp 16  Ht 5' 10.5" (1.791 m)  Wt 142 lb (64.411 kg)  BMI 20.08 kg/m2  SpO2 99%   Physical Exam   Constitutional: He is oriented to person, place, and time. He appears well-developed and well-nourished. No distress.  HENT:  Head: Normocephalic and atraumatic.  Mouth/Throat: Oropharynx is clear and moist.  Eyes: Conjunctivae and EOM are normal. Pupils are equal, round, and reactive to light.  Neck: Normal range of motion. Neck supple. No tracheal deviation present. No thyromegaly present.  Cardiovascular: Normal rate and regular rhythm.  Murmur heard. 3/6 harsh systolic murmur along RSB radiating into neck bilaterally.  Pulmonary/Chest: Effort normal and breath sounds normal. No respiratory distress. He has no wheezes. He has no rales. He exhibits no tenderness.  Abdominal: Soft. Bowel sounds are normal. He exhibits no distension and no mass. There is no tenderness.  Musculoskeletal: Normal range of motion. He exhibits no edema.  Lymphadenopathy:  He has no cervical adenopathy.  Neurological: He is alert and oriented to person, place, and time. He has normal strength. No cranial nerve deficit or sensory deficit.  Skin: Skin is warm and dry.  Psychiatric: He has a normal mood and affect.    Diagnostic Tests:  [facility] [address], [place]  [address] [phone]  ------------------------------------------------------------ Transthoracic Echocardiography  Patient: [name], [name] MR #: [id number] Study Date: 09/05/2013 Gender: M Age: 83 Height: 180.3cm Weight: 64kg BSA: 1.50m^2 Pt. Status: Room:  ORDERING Candee Furbish REFERRING Candee Furbish SONOGRAPHER Cindy Hazy, RDCS ATTENDING Croitoru, Mihai PERFORMING [facility], Outpatient cc:  ------------------------------------------------------------ LV EF: 60% - 65%  ------------------------------------------------------------ Indications: 424.1 Aortic valve  disorders.  ------------------------------------------------------------ History: PMH: Acquired from the patient and from the patient's chart. PMH: CAD. Shortness of Breath. Aortic Stenosis. Risk factors: Hypertension. Dyslipidemia.  ------------------------------------------------------------ Study Conclusions  - Left ventricle: The cavity size was normal. There was severe concentric hypertrophy. Systolic function was normal. The estimated ejection fraction was in the range of 60% to 65%. Wall motion was normal; there were no regional wall motion abnormalities. Features are consistent with a pseudonormal left ventricular filling pattern, with concomitant abnormal relaxation and increased filling pressure (grade 2 diastolic dysfunction). - Aortic valve: There was severe stenosis. Mild regurgitation. - Left atrium: The atrium was moderately to severely dilated. - Atrial septum: No defect or patent foramen ovale was identified. Transthoracic echocardiography. M-mode, complete 2D, spectral Doppler, and color Doppler. Height: Height: 180.3cm. Height: 71in. Weight: Weight: 64kg. Weight: 140.7lb. Body mass index: BMI: 19.7kg/m^2. Body surface area: BSA: 1.42m^2. Blood pressure: 148/77. Patient status: Outpatient. Location: [facility]  ------------------------------------------------------------  ------------------------------------------------------------ Left ventricle: The cavity size was normal. There was severe concentric hypertrophy. Systolic function was normal. The estimated ejection fraction was in the range of 60% to 65%. Wall motion was normal; there were no regional wall motion abnormalities. Features are consistent with a pseudonormal left ventricular filling pattern, with concomitant abnormal relaxation and increased filling pressure (grade 2 diastolic dysfunction).  ------------------------------------------------------------ Aortic valve: Severely thickened, severely calcified leaflets. Doppler: There was severe stenosis. Mild regurgitation. VTI ratio of LVOT to aortic valve: 0.25. Valve area: 0.86cm^2(VTI). Indexed valve area: 0.47cm^2/m^2 (VTI). Peak velocity ratio of LVOT to aortic valve: 0.22. Valve area: 0.78cm^2 (Vmax). Indexed valve area: 0.43cm^2/m^2 (Vmax). Mean gradient: 30mm Hg (S). Peak gradient: 182mm Hg (S).  ------------------------------------------------------------ Aorta: Aortic root: The aortic root was normal in size. Ascending aorta: The ascending aorta was normal in size.  ------------------------------------------------------------ Mitral valve: Mildly thickened leaflets . Doppler: No significant regurgitation. Peak gradient: 82mm Hg (D).  ------------------------------------------------------------ Left atrium: The atrium was moderately to severely dilated.  ------------------------------------------------------------ Atrial septum: No defect or patent foramen ovale was identified.  ------------------------------------------------------------ Right ventricle: The cavity size was normal. Wall thickness was normal. Systolic function was normal.  ------------------------------------------------------------ Pulmonic valve: Poorly visualized. Doppler: No significant  regurgitation.  ------------------------------------------------------------ Tricuspid valve: Structurally normal valve. Leaflet separation was normal. Doppler: Transvalvular velocity was within the normal range. Trivial regurgitation.  ------------------------------------------------------------ Pulmonary artery: Systolic pressure was within the normal range.  ------------------------------------------------------------ Right atrium: The atrium was normal in size.  ------------------------------------------------------------ Pericardium: There was no pericardial effusion.  ------------------------------------------------------------ Systemic veins: Inferior vena cava: The vessel was dilated; the respirophasic diameter changes were blunted (< 50%); findings are consistent with elevated central venous pressure.  ------------------------------------------------------------  2D measurements Normal Doppler measurements Normal Left ventricle Main pulmonary LVID ED, 34.9 mm 43-52 artery chord, Pressure, 21 mm Hg =30 PLAX S LVID ES, 24.1 mm 23-38 Left ventricle chord, Ea, lat 6.8 cm/s ------ PLAX ann, tiss FS, chord, 31 % >29 DP PLAX E/Ea, lat 17.0 ------ LVPW, ED 19.6 mm ------ ann, tiss 6 IVS/LVPW 0.94 <1.3 DP ratio, ED Ea, med 6.25 cm/s ------ Ventricular septum ann, tiss IVS, ED 18.5 mm ------ DP LVOT E/Ea, med 18.5 ------ Diam, S 21 mm ------ ann, tiss 6 Area 3.46 cm^2 ------ DP Diam 21 mm ------ LVOT Aorta Peak vel, 112 cm/s ------ Root diam, 33 mm ------ S ED VTI, S 32.2 cm ------ Left atrium Peak 5 mm Hg ------ AP dim 50 mm ------ gradient, AP dim 2.75 cm/m^2 <2.2 S index Stroke  vol 111. ml ------ 5 Stroke 61.3 ml/m^2 ------ index Aortic valve Peak vel, 500 cm/s ------ S Mean vel, 361 cm/s ------ S VTI, S 129 cm ------ Mean 61 mm Hg ------ gradient, S Peak 100 mm Hg ------ gradient, S VTI ratio 0.25 ------ LVOT/AV Area, VTI 0.86 cm^2 ------ Area  index 0.47 cm^2/m ------ (VTI) ^2 Peak vel 0.22 ------ ratio, LVOT/AV Area, Vmax 0.78 cm^2 ------ Area index 0.43 cm^2/m ------ (Vmax) ^2 Regurg PHT 527 ms ------ Mitral valve Peak E vel 116 cm/s ------ Peak A vel 117 cm/s ------ Decelerati 275 ms 150-23 on time 0 Peak 5 mm Hg ------ gradient, D Peak E/A 1 ------ ratio Tricuspid valve Regurg 215 cm/s ------ peak vel Peak RV-RA 18 mm Hg ------ gradient, S Systemic veins Estimated 3 mm Hg ------ CVP Right ventricle Pressure, 21 mm Hg <30 S Sa vel, 16.4 cm/s ------ lat ann, tiss DP  ------------------------------------------------------------ Prepared and Electronically Authenticated by  Croitoru, Mihai 2015-04-30T15:48:[id number]   CARDIAC CATHETERIZATION  PROCEDURE: Selective coronary angiography via the radial artery approach. Right heart catheterization via the right brachial vein approach.  INDICATIONS: 73 year old male with severe aortic stenosis, Dr. Cyndia Bent, preoperative cardiac catheterization.  The risks, benefits, and details of the procedure were explained to the patient, including possibilities of stroke, heart attack, death, renal impairment, arterial damage, bleeding. The patient verbalized understanding and wanted to proceed. Informed written consent was obtained.  PROCEDURE TECHNIQUE: The right brachial vein site was prepped and draped in a sterile fashion, IV was placed. A 5 French sheath was attempted however there was buckling of the sheath at the skin insertion point and this was exchanged for a glide sheath which was inserted without difficulty. 5 [place] Swan-Ganz catheter was then placed into right-sided heart structures, up to wedge position without difficulty. Pulmonary artery saturation drawn. Oxygen saturation drawn, aortic, from left heart catheterization site. Following this procedure, right heart catheter was removed without difficulty. Brachial vein sheath was removed and pressure held.  The right  radial artery site was prepped and draped in a sterile fashion. One percent lidocaine was used for local anesthesia. Using the modified Seldinger technique a 5 French hydrophilic sheath was inserted into the radial artery without difficulty. 3 mg of verapamil was administered via the sheath. A Judkins right #4 catheter with the guidance of a Versicore wire was placed in the right coronary cusp and selectively cannulated the right coronary artery. After traversing the aortic arch, 3200 units of heparin IV was administered. A Judkins left #3.5 catheter was used to selectively cannulate the left main artery. Multiple views with hand injection of Omnipaque were obtained. Following the procedure, sheath was removed, patient was hemodynamically stable, hemostasis was maintained with a Terumo T band.  CONTRAST: Total of 50 ml.  FLOUROSCOPY TIME: 3.9 min.  COMPLICATIONS: None.  HEMODYNAMICS: Aortic pressure was 299/24 mmHg; LV systolic pressure was not measured  Right heart catheterization:  Right atrium: 7/8 mm of mercury  Right ventricle: 43/4 with an end-diastolic pressure of 8 mm mercury  PA pressure: 41/14 with a mean of 25 mmHg  Pulmonary capillary wedge pressure: 17/16 with a mean of 12 mm mercury  Cardiac output: 5.77 L per minute, cardiac index 3.17  ANGIOGRAPHIC DATA:  Left main: No angiographically significant disease, branches into circumflex and LAD  Left anterior descending (LAD): Minor luminal irregularities of mid LAD, mild ostial first and second diagonal stenosis, nonflow limiting.  Circumflex artery (CIRC): To obtuse marginal branches no angiographically significant  disease, minor luminal irregularities distally.  Right coronary artery (RCA): Mild to moderate disease of the right coronary artery diffuse but no flow limitation. Gives rise to the posterior descending artery, dominant.  LEFT VENTRICULOGRAM: Not performed  IMPRESSIONS:  1. Minor luminal irregularities throughout  vessels. 2. Minimally elevated right-sided heart pressures. Normal cardiac output. 3. Severe aortic calcification and limitation of aortic valve noted during angiography. (Aortic valve was not crossed). Echo data demonstrated severe stenosis, severe calcification, mild aortic regurgitation, peak velocity of 5.0 m/s, mean gradient of 61 mm mercury. RECOMMENDATION: Results will be forwarded to Dr. Cyndia Bent.     Impression:   He has critical aortic stenosis with a mean gradient of 61 mm Hg, 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 His aortic stenosis has progressed significantly from last year with a peak velocity of 4 m/sec last year and 5 m/sec now. He says that he is asymptomatic but he is not active at all and may be in some denial. Given his high gradient and severe LVH I think it is time to proceed with AVR to prevent further deterioration of his LV function. I discussed the echo findings with him and my recommendation to proceed with surgery. I discussed the pros and cons of mechanical and tissue valves and my recommendation for a tissue valve given his age of 15. He is in agreement.  I discussed the operative procedure with the patient including alternatives, benefits and risks; including but not limited to bleeding, blood transfusion, infection, stroke, myocardial infarction, heart block requiring a permanent pacemaker, organ dysfunction, and death. All of his questions have been answered. [name] understands and agrees to proceed.    Plan: Aortic valve replacement with a tissue valve.

## 2013-11-19 NOTE — Brief Op Note (Signed)
11/19/2013  10:29 AM  PATIENT:  [name]  73 y.o. male  PRE-OPERATIVE DIAGNOSIS:  SEVERE AORTIC STENOSIS  POST-OPERATIVE DIAGNOSIS:  SEVERE AORTIC STENOSIS  PROCEDURE: INTRAOPERATIVE TRANSESOPHAGEAL ECHOCARDIOGRAM, MEDIAN STERNOTOMY for   AORTIC VALVE REPLACEMENT (AVR) (using a Pericardial Tissue Valve, size 21 mm)  SURGEON:  Surgeon(s) and Role:    * Gaye Pollack, MD - Primary  PHYSICIAN ASSISTANT: Lars Pinks PA-C  ANESTHESIA:   general  EBL:  Total I/O In: 600 [I.V.:600] Out: 635 [Urine:635]  BLOOD ADMINISTERED:One PLTS  DRAINS: Chest tubes placed in the mediastinal and pleural spaces   SPECIMEN:  Source of Specimen:  Native AV leafltets  DISPOSITION OF SPECIMEN:  PATHOLOGY  COUNTS CORRECT:  YES  DICTATION: .Dragon Dictation  PLAN OF CARE: Admit to inpatient   PATIENT DISPOSITION:  ICU - intubated and hemodynamically stable.   Delay start of Pharmacological VTE agent (>24hrs) due to surgical blood loss or risk of bleeding: yes  BASELINE WEIGHT: 63 kg  Aortic Valve Etiology   Aortic Insufficiency:  Mild  Aortic Valve Disease:  Yes.  Aortic Stenosis:  Yes. Smallest Aortic Valve Area: 0.78cm2; Highest Mean Gradient: 170mmHg.  Etiology (Choose at least one and up to  5 etiologies):  Degenerative - Calcified  Aortic Valve  Procedure Performed:  Replacement: Yes.  Bioprosthetic Valve. Implant Model Number:[id number], Size:21 mm, Unique Device Identifier:[id number]  Repair/Reconstruction: No.   Aortic Annular Enlargement: No.

## 2013-11-19 NOTE — Progress Notes (Signed)
  Echocardiogram Echocardiogram Transesophageal has been performed.  Mauricio Po 11/19/2013, 8:19 AM

## 2013-11-19 NOTE — Interval H&P Note (Signed)
History and Physical Interval Note:  11/19/2013 7:15 AM  [name]  has presented today for surgery, with the diagnosis of AORTIC STENOSIS  The various methods of treatment have been discussed with the patient and family. After consideration of risks, benefits and other options for treatment, the patient has consented to  Procedure(s): AORTIC VALVE REPLACEMENT (AVR) (N/A) INTRAOPERATIVE TRANSESOPHAGEAL ECHOCARDIOGRAM (N/A) as a surgical intervention .  The patient's history has been reviewed, patient examined, no change in status, stable for surgery.  I have reviewed the patient's chart and labs.  Questions were answered to the patient's satisfaction.     Gaye Pollack

## 2013-11-19 NOTE — Procedures (Signed)
Extubation Procedure Note  Patient Details:   Name: [name] DOB: Dec 30, 1940 MRN: [id number]   Airway Documentation:  Pre Extubation: Pt follows all commands, ABG in normal range, NIF -30, VC 2L, Cuff Leak present Post Extubation: No Stridor, Pt placed on 2L [place]. Pt able to verbalized name/location.   Evaluation  O2 sats: stable throughout Complications: No apparent complications Patient did tolerate procedure well. Bilateral Breath Sounds: Clear   Yes  Sharen Hint 11/19/2013, 11:38 PM

## 2013-11-19 NOTE — Op Note (Signed)
CARDIOVASCULAR SURGERY OPERATIVE NOTE  11/19/2013 [name] [id number]  Surgeon:  Gaye Pollack, MD  First Assistant: Lars Pinks, PA-C   Preoperative Diagnosis:  Severe aortic stenosis   Postoperative Diagnosis:  Same   Procedure:  1. Median Sternotomy 2. Extracorporeal circulation 3.   Aortic valve replacement using a 21 mm Edwards Pericardial Magna-Ease valve.  Anesthesia:  General Endotracheal   Clinical History/Surgical Indication:  The patient is a 73 year old gentleman with a history of diabetes, hypertension, and hepatitis C s/p antiviral treatment in 2003 at [facility] who underwent catheterization in June 2010 after an abnormal nuclear stress test. He had been having some vague chest discomfort. The cath showed mild, nonobstructive CAD with and EF of 75% and a hyperdynamic LV. There was no gradient across the aortic valve. His echo at that time had shown a mean gradient of 25 mm Hg across the aortic valve. A follow up echo in 08/2012 showed severe AS with a peak velocity of 3.7 m/sec. A follow up echo on 09/05/2013 showed severe AS with a mean gradient of 61 mm Hg and a peak gradient of 100 mm Hg. The AVA was 0.86 cm2. The aorta was normal sized. He underwent cardiac cath on 10/01/2013 which showed no significant coronary disease and minimally elevated right heart pressures with normal cardiac output.He currently denies any symptoms of dyspnea, fatigue, chest pain or dizziness but says he is not very active due to chronic left shoulder pain. 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 His aortic stenosis has progressed significantly from last year with a peak velocity of 4 m/sec last year and 5 m/sec now. He says that he is asymptomatic but he is not active at all and may be in some denial. Given his high gradient and severe LVH I think it  is time to proceed with AVR to prevent further deterioration of his LV function. I discussed the echo findings with him and my recommendation to proceed with surgery. I discussed the pros and cons of mechanical and tissue valves and my recommendation for a tissue valve given his age of 73. He is in agreement. I discussed the operative procedure with the patient including alternatives, benefits and risks; including but not limited to bleeding, blood transfusion, infection, stroke, myocardial infarction, heart block requiring a permanent pacemaker, organ dysfunction, and death. All of his questions have been answered. [name] understands and agrees to proceed.     Preparation:  The patient was seen in the preoperative holding area and the correct patient, correct operation were confirmed with the patient after reviewing the medical record and catheterization. The consent was signed by me. Preoperative antibiotics were given. A pulmonary arterial line and radial arterial line were placed by the anesthesia team. The patient was taken back to the operating room and positioned supine on the operating room table. After being placed under general endotracheal anesthesia by the anesthesia team a foley catheter was placed. The neck, chest, abdomen, and both legs were prepped with betadine soap and solution and draped in the usual sterile manner. A surgical time-out was taken and the correct patient and operative procedure were confirmed with the nursing and anesthesia staff.   TEE:   Complete TEE assessment was performed by Dr. Lillia Abed.   *[facility]* [place] [address]  [place], [place] [address] [phone]  ------------------------------------------------------------------- Intraoperative Transesophageal Echocardiography  Patient: Daniel, Hoffman MR #: [id number] Study Date: 11/19/2013 Gender: M Age: 73 Height: Weight: BSA: Pt. Status: Room:  2S11C  ADMITTING Gilford Raid, MD ATTENDING Gilford Raid, MD ORDERING Gilford Raid, MD REFERRING Gilford Raid, MD PERFORMING Lillia Abed, M.D. SONOGRAPHER Mauricio Po, RDCS, CCT  cc:  ------------------------------------------------------------------- LV EF: 50% - 55%  ------------------------------------------------------------------- Indications: Aortic valve repair/replacement  ------------------------------------------------------------------- Study Conclusions  - Left ventricle: There was mild concentric hypertrophy. Systolic function was normal. The estimated ejection fraction was in the range of 50% to 55%. - Aortic valve: Moderately calcified annulus. Trileaflet; severely thickened leaflets. There was trivial regurgitation originating from the central coaptation point. - Mitral valve: No evidence of vegetation. - Left atrium: No evidence of thrombus in the atrial cavity or appendage. - Atrial septum: No defect or patent foramen ovale was identified. - Tricuspid valve: No evidence of vegetation. - Post Bypass Evaluation. Prosthetic valve seen seated in the aortic root. No AI. No new wall motion abnormalities. Probe removed uneventfully.  Intraoperative transesophageal echocardiography. Birthdate: Patient birthdate: 16-Aug-1940. Age: Patient is 73 yr old. Sex: Gender: male. Study date: Study date: 11/19/2013. Study time: 07:28 AM.  -------------------------------------------------------------------  ------------------------------------------------------------------- Left ventricle: There was mild concentric hypertrophy. Systolic function was normal. The estimated ejection fraction was in the range of 50% to 55%.  ------------------------------------------------------------------- Aortic valve: Moderately calcified annulus. Trileaflet; severely thickened leaflets. Doppler: There was trivial regurgitation originating from the central coaptation  point.  ------------------------------------------------------------------- Aorta: The aorta was normal, not dilated, and non-diseased.  ------------------------------------------------------------------- Mitral valve: Structurally normal valve. Leaflet separation was normal. No evidence of vegetation. Doppler: There was trivial regurgitation.  ------------------------------------------------------------------- Left atrium: No evidence of thrombus in the atrial cavity or appendage.  ------------------------------------------------------------------- Atrial septum: No defect or patent foramen ovale was identified.  ------------------------------------------------------------------- Tricuspid valve: Structurally normal valve. Leaflet separation was normal. No evidence of vegetation. Doppler: There was mild regurgitation.  ------------------------------------------------------------------- Pre bypass:  Post bypass: Pre bypass: Post bypass: Pre bypass: Post bypass:  ------------------------------------------------------------------- Post procedure conclusions Ascending Aorta:  - The aorta was normal, not dilated, and non-diseased.  ------------------------------------------------------------------- Prepared and Electronically Authenticated by  Lillia Abed, M.D. 2015-07-14T13:11:06           Cardiopulmonary Bypass:  A median sternotomy was performed. The pericardium was opened in the midline. Right ventricular function appeared normal. The ascending aorta was of normal size and had no palpable plaque. There were no contraindications to aortic cannulation or cross-clamping. The patient was fully systemically heparinized and the ACT was maintained > 400 sec. The proximal aortic arch was cannulated with a 20 F aortic cannula for arterial inflow. Venous cannulation was performed via the right atrial appendage using a two-staged venous cannula. An antegrade cardioplegia/vent  cannula was inserted into the mid-ascending aorta. A left ventricular vent was placed via the right superior pulmonary vein. A retrograde cardioplegia cannnula was placed into the coronary sinus via the right atrium. Aortic occlusion was performed with a single cross-clamp. Systemic cooling to 32 degrees Centigrade and topical cooling of the heart with iced saline were used. Hyperkalemic antegrade cold blood cardioplegia was used to induce diastolic arrest and then cold blood retrograde cardioplegia was given at about 20 minute intervals throughout the period of arrest to maintain myocardial temperature at or below 10 degrees centigrade. A temperature probe was inserted into the interventricular septum and an insulating pad was placed in the pericardium. Carbon dioxide was insufflated into the pericardium at  5L/min throughout the procedure to minimize intracardiac air.   Aortic Valve Replacement:   A transverse aortotomy was performed 1 cm above the take-off of the right coronary artery. The native valve was tricuspid with very calcified leaflets and moderate annular calcification. The ostia of the coronary arteries were in normal position and were not obstructed. The native valve leaflets were excised and the annulus was decalcified with rongeurs. Care was taken to remove all particulate debris. The left ventricle was directly inspected for debris and then irrigated with ice saline solution. The annulus was sized and a size 21 Edwards Magna-Ease Pericardial valve was chosen. The model number was [id number] and the serial number was [id number]. While the valve was being prepared 2-0 Ethibond pledgeted horizontal mattress sutures were placed around the annulus with the pledgets in a sub-annular position. The sutures were placed through the sewing ring and the valve lowered into place. The sutures were tied sequentially. The valve seated nicely and the coronary ostia were not obstructed. The prosthetic valve leaflets  moved normally and there was no sub-valvular obstruction. The aortotomy was closed using 4-0 Prolene suture in 2 layers with felt strips to reinforce the closure.  Completion:  The patient was rewarmed to 37 degrees Centigrade. De-airing maneuvers were performed and the head placed in trendelenburg position. The crossclamp was removed with a time of 78 minutes. There was spontaneous return of sinus rhythm. The aortotomy was checked for hemostasis. Two temporary epicardial pacing wires were placed on the right atrium and two on the right ventricle. The left ventricular vent and retrograde cardioplegia cannulas were removed. The patient was weaned from CPB without difficulty on no inotropes. CPB time was 105 minutes. Cardiac output was 5 LPM. Heparin was fully reversed with protamine and the aortic and venous cannulas removed. Hemostasis was achieved. Mediastinal drainage tubes were placed. The sternum was closed with  #6 stainless steel wires. The fascia was closed with continuous # 1 vicryl suture. The subcutaneous tissue was closed with 2-0 vicryl continuous suture. The skin was closed with 3-0 vicryl subcuticular suture. All sponge, needle, and instrument counts were reported correct at the end of the case. Dry sterile dressings were placed over the incisions and around the chest tubes which were connected to pleurevac suction. The patient was then transported to the surgical intensive care unit in critical but stable condition.

## 2013-11-19 NOTE — Progress Notes (Signed)
CT surgery p.m. Rounds  Resting comfortably on full ventilation Hemodynamic stable Chest tube drainage acceptable Postop labs reviewed and are satisfactory Doing well after aVR

## 2013-11-19 NOTE — Progress Notes (Signed)
UR Completed.  Vergie Living [phone] 11/19/2013

## 2013-11-19 NOTE — Anesthesia Postprocedure Evaluation (Signed)
Anesthesia Post Note  Patient: [name]  Procedure(s) Performed: Procedure(s) (LRB): AORTIC VALVE REPLACEMENT (AVR) (N/A) INTRAOPERATIVE TRANSESOPHAGEAL ECHOCARDIOGRAM (N/A)  Anesthesia type: General  Patient location: ICU  Post pain: Pain level controlled  Post assessment: Post-op Vital signs reviewed  Last Vitals:  Filed Vitals:   11/19/13 1400  BP: 88/61  Pulse: 66  Temp: 35 C  Resp: 12    Post vital signs: stable  Level of consciousness: Patient remains intubated per anesthesia plan  Complications: No apparent anesthesia complications

## 2013-11-19 NOTE — Anesthesia Preprocedure Evaluation (Signed)
Anesthesia Evaluation  Patient identified by MRN, date of birth, ID band Patient awake    Reviewed: Allergy & Precautions, H&P , NPO status , Patient's Chart, lab work & pertinent test results  Airway Mallampati: I TM Distance: >3 FB Neck ROM: Full    Dental   Pulmonary former smoker,          Cardiovascular hypertension, Pt. on medications     Neuro/Psych    GI/Hepatic (+) Hepatitis -, C  Endo/Other  diabetes  Renal/GU      Musculoskeletal   Abdominal   Peds  Hematology   Anesthesia Other Findings   Reproductive/Obstetrics                           Anesthesia Physical Anesthesia Plan  ASA: III  Anesthesia Plan: General   Post-op Pain Management:    Induction: Intravenous  Airway Management Planned: Oral ETT  Additional Equipment: Arterial line, CVP, PA Cath, 3D TEE and Ultrasound Guidance Line Placement  Intra-op Plan:   Post-operative Plan: Post-operative intubation/ventilation  Informed Consent: I have reviewed the patients History and Physical, chart, labs and discussed the procedure including the risks, benefits and alternatives for the proposed anesthesia with the patient or authorized representative who has indicated his/her understanding and acceptance.     Plan Discussed with: CRNA and Surgeon  Anesthesia Plan Comments:         Anesthesia Quick Evaluation

## 2013-11-20 ENCOUNTER — Inpatient Hospital Stay (HOSPITAL_COMMUNITY): Payer: Medicare Other

## 2013-11-20 LAB — GLUCOSE, CAPILLARY
GLUCOSE-CAPILLARY: 118 mg/dL — AB (ref 70–99)
GLUCOSE-CAPILLARY: 119 mg/dL — AB (ref 70–99)
GLUCOSE-CAPILLARY: 132 mg/dL — AB (ref 70–99)
GLUCOSE-CAPILLARY: 134 mg/dL — AB (ref 70–99)
GLUCOSE-CAPILLARY: 138 mg/dL — AB (ref 70–99)
GLUCOSE-CAPILLARY: 139 mg/dL — AB (ref 70–99)
GLUCOSE-CAPILLARY: 142 mg/dL — AB (ref 70–99)
GLUCOSE-CAPILLARY: 143 mg/dL — AB (ref 70–99)
GLUCOSE-CAPILLARY: 149 mg/dL — AB (ref 70–99)
GLUCOSE-CAPILLARY: 155 mg/dL — AB (ref 70–99)
GLUCOSE-CAPILLARY: 156 mg/dL — AB (ref 70–99)
GLUCOSE-CAPILLARY: 51 mg/dL — AB (ref 70–99)
GLUCOSE-CAPILLARY: 86 mg/dL (ref 70–99)
Glucose-Capillary: 101 mg/dL — ABNORMAL HIGH (ref 70–99)
Glucose-Capillary: 108 mg/dL — ABNORMAL HIGH (ref 70–99)
Glucose-Capillary: 112 mg/dL — ABNORMAL HIGH (ref 70–99)
Glucose-Capillary: 112 mg/dL — ABNORMAL HIGH (ref 70–99)
Glucose-Capillary: 119 mg/dL — ABNORMAL HIGH (ref 70–99)
Glucose-Capillary: 133 mg/dL — ABNORMAL HIGH (ref 70–99)
Glucose-Capillary: 142 mg/dL — ABNORMAL HIGH (ref 70–99)
Glucose-Capillary: 146 mg/dL — ABNORMAL HIGH (ref 70–99)
Glucose-Capillary: 148 mg/dL — ABNORMAL HIGH (ref 70–99)
Glucose-Capillary: 155 mg/dL — ABNORMAL HIGH (ref 70–99)
Glucose-Capillary: 159 mg/dL — ABNORMAL HIGH (ref 70–99)
Glucose-Capillary: 177 mg/dL — ABNORMAL HIGH (ref 70–99)
Glucose-Capillary: 75 mg/dL (ref 70–99)
Glucose-Capillary: 91 mg/dL (ref 70–99)

## 2013-11-20 LAB — POCT I-STAT 3, ART BLOOD GAS (G3+)
ACID-BASE DEFICIT: 2 mmol/L (ref 0.0–2.0)
Acid-base deficit: 1 mmol/L (ref 0.0–2.0)
BICARBONATE: 22.8 meq/L (ref 20.0–24.0)
BICARBONATE: 23.7 meq/L (ref 20.0–24.0)
O2 SAT: 97 %
O2 Saturation: 99 %
PCO2 ART: 36 mmHg (ref 35.0–45.0)
PO2 ART: 100 mmHg (ref 80.0–100.0)
PO2 ART: 161 mmHg — AB (ref 80.0–100.0)
Patient temperature: 37.5
Patient temperature: 37.9
TCO2: 25 mmol/L (ref 0–100)
TCO2: 24 mmol/L (ref 0–100)
pCO2 arterial: 43.4 mmHg (ref 35.0–45.0)
pH, Arterial: 7.347 — ABNORMAL LOW (ref 7.350–7.450)
pH, Arterial: 7.413 (ref 7.350–7.450)

## 2013-11-20 LAB — POCT I-STAT, CHEM 8
BUN: 22 mg/dL (ref 6–23)
CALCIUM ION: 1.24 mmol/L (ref 1.13–1.30)
CHLORIDE: 105 meq/L (ref 96–112)
Creatinine, Ser: 1.3 mg/dL (ref 0.50–1.35)
GLUCOSE: 149 mg/dL — AB (ref 70–99)
HEMATOCRIT: 24 % — AB (ref 39.0–52.0)
Hemoglobin: 8.2 g/dL — ABNORMAL LOW (ref 13.0–17.0)
Potassium: 4.8 mEq/L (ref 3.7–5.3)
Sodium: 135 mEq/L — ABNORMAL LOW (ref 137–147)
TCO2: 23 mmol/L (ref 0–100)

## 2013-11-20 LAB — CBC
HCT: 25 % — ABNORMAL LOW (ref 39.0–52.0)
HEMATOCRIT: 25.7 % — AB (ref 39.0–52.0)
Hemoglobin: 8.7 g/dL — ABNORMAL LOW (ref 13.0–17.0)
Hemoglobin: 8.5 g/dL — ABNORMAL LOW (ref 13.0–17.0)
MCH: 30.5 pg (ref 26.0–34.0)
MCH: 31.4 pg (ref 26.0–34.0)
MCHC: 33.9 g/dL (ref 30.0–36.0)
MCHC: 34 g/dL (ref 30.0–36.0)
MCV: 90.2 fL (ref 78.0–100.0)
MCV: 92.3 fL (ref 78.0–100.0)
PLATELETS: 104 10*3/uL — AB (ref 150–400)
Platelets: 118 10*3/uL — ABNORMAL LOW (ref 150–400)
RBC: 2.85 MIL/uL — ABNORMAL LOW (ref 4.22–5.81)
RBC: 2.71 MIL/uL — ABNORMAL LOW (ref 4.22–5.81)
RDW: 15.3 % (ref 11.5–15.5)
RDW: 15.9 % — AB (ref 11.5–15.5)
WBC: 9.4 10*3/uL (ref 4.0–10.5)
WBC: 8.3 10*3/uL (ref 4.0–10.5)

## 2013-11-20 LAB — PREPARE PLATELET PHERESIS: Unit division: 0

## 2013-11-20 LAB — MAGNESIUM
Magnesium: 2.4 mg/dL (ref 1.5–2.5)
Magnesium: 2.4 mg/dL (ref 1.5–2.5)

## 2013-11-20 LAB — BASIC METABOLIC PANEL
Anion gap: 13 (ref 5–15)
BUN: 12 mg/dL (ref 6–23)
CHLORIDE: 104 meq/L (ref 96–112)
CO2: 23 meq/L (ref 19–32)
CREATININE: 0.8 mg/dL (ref 0.50–1.35)
Calcium: 8.3 mg/dL — ABNORMAL LOW (ref 8.4–10.5)
GFR calc non Af Amer: 88 mL/min — ABNORMAL LOW (ref >90)
Glucose, Bld: 173 mg/dL — ABNORMAL HIGH (ref 70–99)
Potassium: 4.5 mEq/L (ref 3.7–5.3)
Sodium: 140 mEq/L (ref 137–147)

## 2013-11-20 LAB — CREATININE, SERUM
Creatinine, Ser: 1.24 mg/dL (ref 0.50–1.35)
GFR calc Af Amer: 66 mL/min — ABNORMAL LOW (ref >90)
GFR calc non Af Amer: 57 mL/min — ABNORMAL LOW (ref >90)

## 2013-11-20 MED ORDER — INSULIN ASPART 100 UNIT/ML ~~LOC~~ SOLN
0.0000 [IU] | SUBCUTANEOUS | Status: DC
  Administered 2013-11-20 – 2013-11-21 (×4): 2 [IU] via SUBCUTANEOUS

## 2013-11-20 MED ORDER — FUROSEMIDE 10 MG/ML IJ SOLN
40.0000 mg | Freq: Once | INTRAMUSCULAR | Status: AC
  Administered 2013-11-20: 40 mg via INTRAVENOUS
  Filled 2013-11-20: qty 4

## 2013-11-20 MED ORDER — INSULIN DETEMIR 100 UNIT/ML ~~LOC~~ SOLN
15.0000 [IU] | Freq: Once | SUBCUTANEOUS | Status: AC
  Administered 2013-11-20: 15 [IU] via SUBCUTANEOUS
  Filled 2013-11-20: qty 0.15

## 2013-11-20 MED FILL — Magnesium Sulfate Inj 50%: INTRAMUSCULAR | Qty: 10 | Status: AC

## 2013-11-20 MED FILL — Heparin Sodium (Porcine) Inj 1000 Unit/ML: INTRAMUSCULAR | Qty: 30 | Status: AC

## 2013-11-20 MED FILL — Potassium Chloride Inj 2 mEq/ML: INTRAVENOUS | Qty: 40 | Status: AC

## 2013-11-20 NOTE — Progress Notes (Signed)
Patient ID: [name], male   DOB: 1940/07/27, 73 y.o.   MRN: [id number]  SICU Evening rounds  Stable day. Chest tube output minimal  Urine output ok BMET    Component Value Date/Time   NA 135* 11/20/2013 1808   K 4.8 11/20/2013 1808   CL 105 11/20/2013 1808   CO2 23 11/20/2013 0440   GLUCOSE 149* 11/20/2013 1808   BUN 22 11/20/2013 1808   CREATININE 1.30 11/20/2013 1808   CALCIUM 8.3* 11/20/2013 0440   GFRNONAA 57* 11/20/2013 1800   GFRAA 66* 11/20/2013 1800    CBC    Component Value Date/Time   WBC 8.3 11/20/2013 1800   RBC 2.71* 11/20/2013 1800   HGB 8.2* 11/20/2013 1808   HCT 24.0* 11/20/2013 1808   PLT 104* 11/20/2013 1800   MCV 92.3 11/20/2013 1800   MCH 31.4 11/20/2013 1800   MCHC 34.0 11/20/2013 1800   RDW 15.9* 11/20/2013 1800   LYMPHSABS 1.3 09/27/2013 1118   MONOABS 0.8 09/27/2013 1118   EOSABS 0.1 09/27/2013 1118   BASOSABS 0.0 09/27/2013 1118

## 2013-11-20 NOTE — Progress Notes (Signed)
1 Day Post-Op Procedure(s) (LRB): AORTIC VALVE REPLACEMENT (AVR) (N/A) INTRAOPERATIVE TRANSESOPHAGEAL ECHOCARDIOGRAM (N/A) Subjective: No complaints  Objective: Vital signs in last 24 hours: Temp:  [95 F (35 C)-100 F (37.8 C)] 99 F (37.2 C) (07/15 0800) Pulse Rate:  [66-90] 83 (07/15 0800) Cardiac Rhythm:  [-] Normal sinus rhythm (07/15 0800) Resp:  [9-29] 13 (07/15 0800) BP: (82-133)/(53-75) 104/66 mmHg (07/15 0800) SpO2:  [100 %] 100 % (07/15 0800) Arterial Line BP: (90-139)/(50-64) 107/56 mmHg (07/15 0800) FiO2 (%):  [40 %-50 %] 40 % (07/14 2251) Weight:  [64.6 kg (142 lb 6.7 oz)] 64.6 kg (142 lb 6.7 oz) (07/15 0329)  Hemodynamic parameters for last 24 hours: PAP: (20-36)/(6-16) 30/13 mmHg CO:  [2.3 L/min-5 L/min] 3.7 L/min CI:  [1.6 L/min/m2-4.1 L/min/m2] 2.1 L/min/m2  Intake/Output from previous day: 07/14 0701 - 07/15 0700 In: 4507 [I.V.:2260; Blood:897; IV Piggyback:1350] Out: 6606 [Urine:3850; Blood:1520; Chest Tube:1236] Intake/Output this shift: Total I/O In: 51.8 [I.V.:51.8] Out: 50 [Urine:20; Chest Tube:30]  General appearance: alert and cooperative Neurologic: intact Heart: regular rate and rhythm, S1, S2 normal, no murmur, click, rub or gallop Lungs: clear to auscultation bilaterally Extremities: extremities normal, atraumatic, no cyanosis or edema Wound: dressing dry  Lab Results:  Recent Labs  11/19/13 1817 11/19/13 1826 11/20/13 0440  WBC 7.1  --  9.4  HGB 9.9* 10.9* 8.7*  HCT 29.8* 32.0* 25.7*  PLT 128*  --  118*   BMET:  Recent Labs  11/19/13 1826 11/20/13 0440  NA 141 140  K 4.7 4.5  CL 105 104  CO2  --  23  GLUCOSE 157* 173*  BUN 8 12  CREATININE 0.80 0.80  CALCIUM  --  8.3*    PT/INR:  Recent Labs  11/19/13 1207  LABPROT 18.4*  INR 1.53*   ABG    Component Value Date/Time   PHART 7.347* 11/20/2013 0045   HCO3 23.7 11/20/2013 0045   TCO2 25 11/20/2013 0045   ACIDBASEDEF 2.0 11/20/2013 0045   O2SAT 97.0 11/20/2013  0045   CBG (last 3)   Recent Labs  11/20/13 0212 11/20/13 0306 11/20/13 0430  GLUCAP 159* 177* 156*   CXR: ok  ECG: sinus. No acute changes Assessment/Plan: S/P Procedure(s) (LRB): AORTIC VALVE REPLACEMENT (AVR) (N/A) INTRAOPERATIVE TRANSESOPHAGEAL ECHOCARDIOGRAM (N/A) He has been hemodynamically stable overnight Chest tubes still have some bloody drainage so will keep in for now Mobilize Diuresis Diabetes control: Hgb A1c was 5.7 preop. Will give a dose of Levemir this am and stop insulin drip. Continue foley due to patient in ICU and urinary output monitoring See progression orders   LOS: 1 day    BARTLE,BRYAN K 11/20/2013

## 2013-11-21 ENCOUNTER — Inpatient Hospital Stay (HOSPITAL_COMMUNITY): Payer: Medicare Other

## 2013-11-21 LAB — BASIC METABOLIC PANEL
Anion gap: 12 (ref 5–15)
BUN: 26 mg/dL — AB (ref 6–23)
CALCIUM: 8.3 mg/dL — AB (ref 8.4–10.5)
CO2: 24 mEq/L (ref 19–32)
Chloride: 102 mEq/L (ref 96–112)
Creatinine, Ser: 1.09 mg/dL (ref 0.50–1.35)
GFR calc Af Amer: 77 mL/min — ABNORMAL LOW (ref >90)
GFR calc non Af Amer: 67 mL/min — ABNORMAL LOW (ref >90)
GLUCOSE: 123 mg/dL — AB (ref 70–99)
Potassium: 4.6 mEq/L (ref 3.7–5.3)
Sodium: 138 mEq/L (ref 137–147)

## 2013-11-21 LAB — GLUCOSE, CAPILLARY
GLUCOSE-CAPILLARY: 115 mg/dL — AB (ref 70–99)
GLUCOSE-CAPILLARY: 139 mg/dL — AB (ref 70–99)
GLUCOSE-CAPILLARY: 146 mg/dL — AB (ref 70–99)
Glucose-Capillary: 111 mg/dL — ABNORMAL HIGH (ref 70–99)
Glucose-Capillary: 111 mg/dL — ABNORMAL HIGH (ref 70–99)

## 2013-11-21 LAB — CBC
HEMATOCRIT: 22.7 % — AB (ref 39.0–52.0)
Hemoglobin: 7.8 g/dL — ABNORMAL LOW (ref 13.0–17.0)
MCH: 31.6 pg (ref 26.0–34.0)
MCHC: 34.4 g/dL (ref 30.0–36.0)
MCV: 91.9 fL (ref 78.0–100.0)
Platelets: 95 10*3/uL — ABNORMAL LOW (ref 150–400)
RBC: 2.47 MIL/uL — ABNORMAL LOW (ref 4.22–5.81)
RDW: 15.7 % — ABNORMAL HIGH (ref 11.5–15.5)
WBC: 7.3 10*3/uL (ref 4.0–10.5)

## 2013-11-21 LAB — PREPARE RBC (CROSSMATCH)

## 2013-11-21 MED ORDER — BISACODYL 10 MG RE SUPP: 10.0000 mg | Freq: Every day | RECTAL | Status: DC | PRN

## 2013-11-21 MED ORDER — METOPROLOL TARTRATE 12.5 MG HALF TABLET
12.5000 mg | ORAL_TABLET | Freq: Two times a day (BID) | ORAL | Status: DC
Start: 2013-11-21 — End: 2013-11-24
  Administered 2013-11-21 – 2013-11-24 (×6): 12.5 mg via ORAL
  Filled 2013-11-21 (×8): qty 1

## 2013-11-21 MED ORDER — FERROUS GLUCONATE 324 (38 FE) MG PO TABS
324.0000 mg | ORAL_TABLET | Freq: Every day | ORAL | Status: DC
  Administered 2013-11-21 – 2013-11-24 (×4): 324 mg via ORAL
  Filled 2013-11-21 (×5): qty 1

## 2013-11-21 MED ORDER — SODIUM CHLORIDE 0.9 % IJ SOLN
3.0000 mL | Freq: Two times a day (BID) | INTRAMUSCULAR | Status: DC
  Administered 2013-11-21 – 2013-11-23 (×6): 3 mL via INTRAVENOUS

## 2013-11-21 MED ORDER — TRAMADOL HCL 50 MG PO TABS
50.0000 mg | ORAL_TABLET | ORAL | Status: DC | PRN
  Filled 2013-11-21: qty 1

## 2013-11-21 MED ORDER — DOCUSATE SODIUM 100 MG PO CAPS
200.0000 mg | ORAL_CAPSULE | Freq: Every day | ORAL | Status: DC
  Administered 2013-11-22 – 2013-11-24 (×3): 200 mg via ORAL
  Filled 2013-11-21 (×3): qty 2

## 2013-11-21 MED ORDER — SODIUM CHLORIDE 0.9 % IV SOLN: 250.0000 mL | INTRAVENOUS | Status: DC | PRN

## 2013-11-21 MED ORDER — INSULIN ASPART 100 UNIT/ML ~~LOC~~ SOLN
0.0000 [IU] | Freq: Three times a day (TID) | SUBCUTANEOUS | Status: DC
  Administered 2013-11-22 – 2013-11-23 (×4): 2 [IU] via SUBCUTANEOUS

## 2013-11-21 MED ORDER — FUROSEMIDE 40 MG PO TABS
40.0000 mg | ORAL_TABLET | Freq: Every day | ORAL | Status: AC
  Administered 2013-11-22 – 2013-11-24 (×3): 40 mg via ORAL
  Filled 2013-11-21 (×3): qty 1

## 2013-11-21 MED ORDER — FUROSEMIDE 10 MG/ML IJ SOLN
40.0000 mg | Freq: Once | INTRAMUSCULAR | Status: AC
  Administered 2013-11-21: 40 mg via INTRAVENOUS
  Filled 2013-11-21: qty 4

## 2013-11-21 MED ORDER — ONDANSETRON HCL 4 MG/2ML IJ SOLN: 4.0000 mg | Freq: Four times a day (QID) | INTRAMUSCULAR | Status: DC | PRN

## 2013-11-21 MED ORDER — OXYCODONE HCL 5 MG PO TABS
5.0000 mg | ORAL_TABLET | ORAL | Status: DC | PRN
  Administered 2013-11-21 – 2013-11-23 (×3): 10 mg via ORAL
  Filled 2013-11-21 (×3): qty 2

## 2013-11-21 MED ORDER — BISACODYL 5 MG PO TBEC
10.0000 mg | DELAYED_RELEASE_TABLET | Freq: Every day | ORAL | Status: DC | PRN
  Administered 2013-11-22: 10 mg via ORAL
  Filled 2013-11-21: qty 2

## 2013-11-21 MED ORDER — ACETAMINOPHEN 325 MG PO TABS: 650.0000 mg | ORAL_TABLET | Freq: Four times a day (QID) | ORAL | Status: DC | PRN

## 2013-11-21 MED ORDER — MOVING RIGHT ALONG BOOK
Freq: Once | Status: AC
  Administered 2013-11-21: 13:00:00
  Filled 2013-11-21: qty 1

## 2013-11-21 MED ORDER — POTASSIUM CHLORIDE CRYS ER 20 MEQ PO TBCR
20.0000 meq | EXTENDED_RELEASE_TABLET | Freq: Every day | ORAL | Status: AC
  Administered 2013-11-22 – 2013-11-24 (×3): 20 meq via ORAL
  Filled 2013-11-21 (×3): qty 1

## 2013-11-21 MED ORDER — INSULIN DETEMIR 100 UNIT/ML ~~LOC~~ SOLN
20.0000 [IU] | Freq: Every day | SUBCUTANEOUS | Status: DC
  Administered 2013-11-21: 20 [IU] via SUBCUTANEOUS
  Filled 2013-11-21 (×2): qty 0.2

## 2013-11-21 MED ORDER — ASPIRIN EC 81 MG PO TBEC
81.0000 mg | DELAYED_RELEASE_TABLET | Freq: Every day | ORAL | Status: DC
  Administered 2013-11-22 – 2013-11-24 (×3): 81 mg via ORAL
  Filled 2013-11-21 (×3): qty 1

## 2013-11-21 MED ORDER — PANTOPRAZOLE SODIUM 40 MG PO TBEC
40.0000 mg | DELAYED_RELEASE_TABLET | Freq: Every day | ORAL | Status: DC
  Administered 2013-11-22 – 2013-11-24 (×3): 40 mg via ORAL
  Filled 2013-11-21 (×3): qty 1

## 2013-11-21 MED ORDER — SODIUM CHLORIDE 0.9 % IJ SOLN: 3.0000 mL | INTRAMUSCULAR | Status: DC | PRN

## 2013-11-21 MED ORDER — ONDANSETRON HCL 4 MG PO TABS: 4.0000 mg | ORAL_TABLET | Freq: Four times a day (QID) | ORAL | Status: DC | PRN

## 2013-11-21 NOTE — Progress Notes (Signed)
2 Days Post-Op Procedure(s) (LRB): AORTIC VALVE REPLACEMENT (AVR) (N/A) INTRAOPERATIVE TRANSESOPHAGEAL ECHOCARDIOGRAM (N/A) Subjective: No complaints  Objective: Vital signs in last 24 hours: Temp:  [97.2 F (36.2 C)-99 F (37.2 C)] 98.2 F (36.8 C) (07/16 0802) Pulse Rate:  [75-89] 82 (07/16 0800) Cardiac Rhythm:  [-] Normal sinus rhythm (07/16 0400) Resp:  [15-31] 16 (07/16 0800) BP: (85-132)/(54-74) 124/65 mmHg (07/16 0800) SpO2:  [94 %-100 %] 98 % (07/16 0800) Arterial Line BP: (114-121)/(54-63) 114/56 mmHg (07/15 1200) Weight:  [65.2 kg (143 lb 11.8 oz)] 65.2 kg (143 lb 11.8 oz) (07/16 0500)  Hemodynamic parameters for last 24 hours: PAP: (30-33)/(11-17) 30/11 mmHg  Intake/Output from previous day: 07/15 0701 - 07/16 0700 In: 638.5 [I.V.:538.5; IV Piggyback:100] Out: 820 [Urine:640; Chest Tube:180] Intake/Output this shift: Total I/O In: 20 [I.V.:20] Out: 30 [Urine:30]  General appearance: alert and cooperative Neurologic: intact Heart: regular rate and rhythm, S1, S2 normal, no murmur, click, rub or gallop Lungs: clear to auscultation bilaterally Extremities: extremities normal, atraumatic, no cyanosis or edema Wound: incision ok  Lab Results:  Recent Labs  11/20/13 1800 11/20/13 1808 11/21/13 0420  WBC 8.3  --  7.3  HGB 8.5* 8.2* 7.8*  HCT 25.0* 24.0* 22.7*  PLT 104*  --  95*   BMET:  Recent Labs  11/20/13 0440  11/20/13 1808 11/21/13 0420  NA 140  --  135* 138  K 4.5  --  4.8 4.6  CL 104  --  105 102  CO2 23  --   --  24  GLUCOSE 173*  --  149* 123*  BUN 12  --  22 26*  CREATININE 0.80  < > 1.30 1.09  CALCIUM 8.3*  --   --  8.3*  < > = values in this interval not displayed.  PT/INR:  Recent Labs  11/19/13 1207  LABPROT 18.4*  INR 1.53*   ABG    Component Value Date/Time   PHART 7.347* 11/20/2013 0045   HCO3 23.7 11/20/2013 0045   TCO2 23 11/20/2013 1808   ACIDBASEDEF 2.0 11/20/2013 0045   O2SAT 97.0 11/20/2013 0045   CBG (last 3)    Recent Labs  11/20/13 1933 11/20/13 2322 11/21/13 0338  GLUCAP 155* 119* 115*   CXR: ok  Assessment/Plan: S/P Procedure(s) (LRB): AORTIC VALVE REPLACEMENT (AVR) (N/A) INTRAOPERATIVE TRANSESOPHAGEAL ECHOCARDIOGRAM (N/A) Hemodynamically stable Acute blood loss anemia: will transfuse a unit of PBRC's this am since Hgb down below 8 and start iron He has hx of prior GI bleed of uncertain location worked up extensively. Mobilize Diuresis Diabetes control d/c tubes/lines Plan for transfer to step-down: see transfer orders   LOS: 2 days    Roanne Haye K 11/21/2013

## 2013-11-21 NOTE — Addendum Note (Signed)
Addendum created 11/21/13 1329 by Laurie Panda, MD   Modules edited: Anesthesia LDA

## 2013-11-21 NOTE — Progress Notes (Signed)
CARDIAC REHAB PHASE I   PRE:  Rate/Rhythm: 79 SR  BP:  Supine:   Sitting: 140/70  Standing:    SaO2: 94%RA  MODE:  Ambulation: 310 ft   POST:  Rate/Rhythm: 86 SR  BP:  Supine:   Sitting: 138/70  Standing:    SaO2: 91%RA 1415-1450 Assisted pt to put pajama bottoms on prior to walk. Pt walked 310 ft on RA with rolling walker and asst x 1 with steady gait. Tolerated well. To recliner after walk. Call bell in reach.   Graylon Good, RN BSN  11/21/2013 2:46 PM

## 2013-11-22 ENCOUNTER — Inpatient Hospital Stay (HOSPITAL_COMMUNITY): Payer: Medicare Other

## 2013-11-22 ENCOUNTER — Encounter (HOSPITAL_COMMUNITY): Payer: Self-pay | Admitting: Surgery

## 2013-11-22 DIAGNOSIS — Z952 Presence of prosthetic heart valve: Secondary | ICD-10-CM

## 2013-11-22 LAB — CBC
HEMATOCRIT: 24.4 % — AB (ref 39.0–52.0)
HEMOGLOBIN: 8.4 g/dL — AB (ref 13.0–17.0)
MCH: 30.8 pg (ref 26.0–34.0)
MCHC: 34.4 g/dL (ref 30.0–36.0)
MCV: 89.4 fL (ref 78.0–100.0)
Platelets: 94 10*3/uL — ABNORMAL LOW (ref 150–400)
RBC: 2.73 MIL/uL — ABNORMAL LOW (ref 4.22–5.81)
RDW: 15.2 % (ref 11.5–15.5)
WBC: 6.2 10*3/uL (ref 4.0–10.5)

## 2013-11-22 LAB — BASIC METABOLIC PANEL
Anion gap: 12 (ref 5–15)
BUN: 24 mg/dL — AB (ref 6–23)
CO2: 24 mEq/L (ref 19–32)
CREATININE: 0.94 mg/dL (ref 0.50–1.35)
Calcium: 8.4 mg/dL (ref 8.4–10.5)
Chloride: 99 mEq/L (ref 96–112)
GFR calc non Af Amer: 83 mL/min — ABNORMAL LOW (ref >90)
GLUCOSE: 104 mg/dL — AB (ref 70–99)
Potassium: 4.3 mEq/L (ref 3.7–5.3)
Sodium: 135 mEq/L — ABNORMAL LOW (ref 137–147)

## 2013-11-22 LAB — GLUCOSE, CAPILLARY
GLUCOSE-CAPILLARY: 134 mg/dL — AB (ref 70–99)
GLUCOSE-CAPILLARY: 120 mg/dL — AB (ref 70–99)
Glucose-Capillary: 106 mg/dL — ABNORMAL HIGH (ref 70–99)
Glucose-Capillary: 150 mg/dL — ABNORMAL HIGH (ref 70–99)

## 2013-11-22 LAB — TYPE AND SCREEN
ABO/RH(D): O POS
Antibody Screen: NEGATIVE
Unit division: 0

## 2013-11-22 MED ORDER — METFORMIN HCL 500 MG PO TABS
1000.0000 mg | ORAL_TABLET | Freq: Every day | ORAL | Status: DC
  Administered 2013-11-22 – 2013-11-24 (×3): 1000 mg via ORAL
  Filled 2013-11-22 (×4): qty 2

## 2013-11-22 MED ORDER — LISINOPRIL 10 MG PO TABS
10.0000 mg | ORAL_TABLET | Freq: Every day | ORAL | Status: DC
  Administered 2013-11-22 – 2013-11-24 (×3): 10 mg via ORAL
  Filled 2013-11-22 (×3): qty 1

## 2013-11-22 MED ORDER — METFORMIN HCL 500 MG PO TABS
1500.0000 mg | ORAL_TABLET | Freq: Every day | ORAL | Status: DC
  Administered 2013-11-22 – 2013-11-23 (×2): 1500 mg via ORAL
  Filled 2013-11-22 (×4): qty 3

## 2013-11-22 NOTE — Discharge Summary (Signed)
Physician Discharge Summary  Patient ID: VIN YONKE MRN: [id number] DOB/AGE: 11/25/1940 73 y.o.  Admit date: 11/19/2013 Discharge date: 11/22/2013  Admission Diagnoses:  Patient Active Problem List   Diagnosis Date Noted  . Aortic stenosis, severe 11/19/2013  . Aortic stenosis 09/03/2013  . Hyperlipidemia 09/03/2013  . Weight loss 09/03/2013  . Coronary artery disease 09/03/2013  . Hepatitis C 05/29/2013  . Unspecified gastritis and gastroduodenitis without mention of hemorrhage 04/24/2013  . Iron deficiency anemia due to chronic blood loss 04/23/2013  . Hypertension   . Diabetes mellitus without complication   . Shortness of breath 04/22/2013   Discharge Diagnoses:   Patient Active Problem List   Diagnosis Date Noted  . S/P AVR (aortic valve replacement) 11/22/2013  . Aortic stenosis, severe 11/19/2013  . Aortic stenosis 09/03/2013  . Hyperlipidemia 09/03/2013  . Weight loss 09/03/2013  . Coronary artery disease 09/03/2013  . Hepatitis C 05/29/2013  . Unspecified gastritis and gastroduodenitis without mention of hemorrhage 04/24/2013  . Iron deficiency anemia due to chronic blood loss 04/23/2013  . Hypertension   . Diabetes mellitus without complication   . Shortness of breath 04/22/2013   Discharged Condition: good  History of Present Illness:  Mr. Gronau is a 73 yo male with known history of diabetes, hypertension, and Hepatitis C.  In 2010 the patient developed some chest discomfort.  Workup with a stress test was abnormal, prompting patient to undergo cardiac catheterization.  This showed mild non-obstructive CAD and a preserved EF.  The patient also underwent Echocardiogram which showed a gradient of 25 mm Hg across the aortic valve.  The patient had a follow up Echocardiogram 08/2012 which showed severe Aortic Stenosis with a peak velocity of 3.7 m/sec.  Patient again had follow up Echocardiogram on 09/05/2013 which again showed severe Aortic Stenosis and an AVA of  0.86 cm2. He was referred to [facility] for surgical consultation.  He was evaluated by Dr. Cyndia Bent on 09/26/2013 at which time if was felt he would be a candidate for AVR.  However, it was felt he should undergo cardiac catheterization prior to proceeding with surgery.   He underwent cardiac catheterization on 10/01/2013 which showed no significant coronary disease.  He again presented for evaluation by Dr. Cyndia Bent on 10/16/2013 at which time it was decided he would benefit from Aortic Valve replacement.  The risks and benefits of the procedure were explained to the patient and he was agreeable to proceed.    Hospital Course:   Mr. Vollmer presented to [facility] on 11/19/2013.  He underwent Aortic Valve Replacement utilizing a 21 mm Edwards Pericardial Magna Ease Tissue Valve.  He tolerated the procedure well and was taken to the SICU in stable condition.  The patient was extubated the evening of surgery.  During his stay in the SICU the patient developed expected post operative blood loss anemia.  He was transfused packed cells.  His chest tubes and arterial lines were removed without difficulty.  He was maintaining NSR and transferred to the telemetry in stable condition.  The patient continues to progress.  He is maintaining NSR and his pacing wires were removed without difficulty.  He had developed some hypertension and was restarted on his home regimen of Lisinopril.  His blood sugars have been well controlled.  He has been restarted on his oral medication regimen is no longer requiring insulin therapy.  The patient is ambulating without difficulty.  He is tolerating a carb modified diet.  Should no issues  arise we anticipate discharge home in the next 24-48 hours.  Significant Diagnostic Studies: cardiac graphics:   Echocardiogram:   - Left ventricle: There was mild concentric hypertrophy. Systolic function was normal. The estimated ejection fraction was in the range of 50% to 55%. - Aortic valve:  Moderately calcified annulus. Trileaflet; severely thickened leaflets. There was trivial regurgitation originating from the central coaptation point. - Mitral valve: No evidence of vegetation. - Left atrium: No evidence of thrombus in the atrial cavity or appendage. - Atrial septum: No defect or patent foramen ovale was identified. - Tricuspid valve: No evidence of vegetation. - Post Bypass Evaluation. Prosthetic valve seen seated in the aortic root. No AI. No new wall motion abnormalities. Probe removed uneventfully.  Treatments: surgery:   1. Median Sternotomy 2. Extracorporeal circulation 3. Aortic valve replacement using a 21 mm Edwards Pericardial Magna-Ease valve.   Disposition: 01-Home or Self Care  Discharge Medications:   The patient has been discharged on:   Medication List    STOP taking these medications       amLODipine 10 MG tablet  Commonly known as:  NORVASC     metoprolol succinate 25 MG 24 hr tablet  Commonly known as:  TOPROL-XL      TAKE these medications       aspirin 81 MG EC tablet  Take 1 tablet (81 mg total) by mouth daily.     atorvastatin 20 MG tablet  Commonly known as:  LIPITOR  Take 20 mg by mouth at bedtime.     ferrous gluconate 324 MG tablet  Commonly known as:  FERGON  Take 1 tablet (324 mg total) by mouth daily with breakfast.     hydrocortisone 25 MG suppository  Commonly known as:  ANUSOL-HC  Place 1 suppository (25 mg total) rectally at bedtime.     lisinopril-hydrochlorothiazide 20-25 MG per tablet  Commonly known as:  PRINZIDE,ZESTORETIC  Take 1 tablet by mouth daily.     metFORMIN 500 MG tablet  Commonly known as:  GLUCOPHAGE  Take 1,000-1,500 mg by mouth 2 (two) times daily with a meal. Take 1000mg  in the morning and 1500 in the evening     metoprolol tartrate 12.5 mg Tabs tablet  Commonly known as:  LOPRESSOR  Take 0.5 tablets (12.5 mg total) by mouth 2 (two) times daily.     oxyCODONE 5 MG immediate release  tablet  Commonly known as:  Oxy IR/ROXICODONE  Take 1-2 tablets (5-10 mg total) by mouth every 4 (four) hours as needed for moderate pain.     pantoprazole 40 MG tablet  Commonly known as:  PROTONIX  Take 40 mg by mouth daily.        1.Beta Blocker:  Yes [ x  ]                              No   [   ]                              If No, reason:  2.Ace Inhibitor/ARB: Yes [ x  ]                                     No  [    ]  If No, reason:  3.Statin:   Yes [x   ]                  No  [   ]                  If No, reason:  4.Ecasa:  Yes  [ x  ]                  No   [   ]                  If No, reason:     Follow-up Information   Follow up with Gaye Pollack, MD.   Specialty:  Cardiothoracic Surgery   Contact information:   [place] [address] [phone]       Follow up with [facility].   Contact information:   [place]       Signed: BARRETT, ERIN 11/22/2013, 10:17 AM

## 2013-11-22 NOTE — Progress Notes (Signed)
CARDIAC REHAB PHASE I   PRE:  Rate/Rhythm: 94 SR  BP:  Supine:   Sitting: 112/70  Standing:    SaO2: 95%RA  MODE:  Ambulation: 650 ft   POST:  Rate/Rhythm: 94  BP:  Supine:   Sitting: 130/80  Standing:    SaO2: 95%RA 1425-1515 Pt walked 650 ft with rolling walker and asst x 1. Pt does not know if he wants rolling walker for home or not. Will let us know if he decides he wants one. Education completed with pt who voiced understanding. Encouraged IS. Discussed CRP 2 and pt gave permission to refer to [place].   Graylon Good, RN BSN  11/22/2013 3:11 PM

## 2013-11-22 NOTE — Progress Notes (Addendum)
[address]       [place] [address]             [phone]      3 Days Post-Op Procedure(s) (LRB): AORTIC VALVE REPLACEMENT (AVR) (N/A) INTRAOPERATIVE TRANSESOPHAGEAL ECHOCARDIOGRAM (N/A) Subjective: Feels ok, no new complaints  Objective: Vital signs in last 24 hours: Temp:  [97.6 F (36.4 C)-98.8 F (37.1 C)] 98.8 F (37.1 C) (07/17 0556) Pulse Rate:  [77-99] 99 (07/17 0556) Cardiac Rhythm:  [-] Normal sinus rhythm (07/17 0826) Resp:  [18-24] 18 (07/17 0556) BP: (104-150)/(62-79) 150/75 mmHg (07/17 0556) SpO2:  [94 %-99 %] 95 % (07/17 0556) Weight:  [144 lb 9.6 oz (65.59 kg)] 144 lb 9.6 oz (65.59 kg) (07/17 0556)  Hemodynamic parameters for last 24 hours:    Intake/Output from previous day: 07/16 0701 - 07/17 0700 In: 813.3 [P.O.:480; I.V.:40; Blood:293.3] Out: 1055 [Urine:1035; Chest Tube:20] Intake/Output this shift:    General appearance: alert, cooperative and no distress Heart: regular rate and rhythm Lungs: clear to auscultation bilaterally Abdomen: benign Extremities: mild edema Wound: inics healing well  Lab Results:  Recent Labs  11/21/13 0420 11/22/13 0350  WBC 7.3 6.2  HGB 7.8* 8.4*  HCT 22.7* 24.4*  PLT 95* 94*   BMET:  Recent Labs  11/21/13 0420 11/22/13 0350  NA 138 135*  K 4.6 4.3  CL 102 99  CO2 24 24  GLUCOSE 123* 104*  BUN 26* 24*  CREATININE 1.09 0.94  CALCIUM 8.3* 8.4    PT/INR:  Recent Labs  11/19/13 1207  LABPROT 18.4*  INR 1.53*   ABG    Component Value Date/Time   PHART 7.347* 11/20/2013 0045   HCO3 23.7 11/20/2013 0045   TCO2 23 11/20/2013 1808   ACIDBASEDEF 2.0 11/20/2013 0045   O2SAT 97.0 11/20/2013 0045   CBG (last 3)   Recent Labs  11/21/13 1647 11/21/13 2123 11/22/13 0650  GLUCAP 111* 111* 106*  Dg Chest 2 View  11/22/2013   CLINICAL DATA:  Status post CABG with shortness of breath and chest discomfort  EXAM: CHEST  2 VIEW  COMPARISON:  Portable chest x-ray of 21 November 2013  FINDINGS: The lungs are reasonably well inflated. There is persistent density at the lung bases especially on the right consistent with subsegmental atelectasis and a small amount of fluid. The cardiac silhouette remains enlarged. The pulmonary vascularity is not engorged. The right internal jugular Cordis sheath and the mediastinal drains have been removed. There are 7 intact sternal wires present. There is a valve ring in the aortic position. There is degenerative change of both shoulders.  IMPRESSION: There has not been significant interval change in the appearance of the chest since yesterday's study. There has been interval removal of support tubes and lines.   Electronically Signed   By: David  Martinique   On: 11/22/2013 07:52   Dg Chest Port 1 View  11/21/2013   CLINICAL DATA:  Status post cardiac surgery.  EXAM: PORTABLE CHEST - 1 VIEW  COMPARISON:  November 20, 2013.  FINDINGS: Stable cardiomediastinal silhouette. Right internal jugular swans Ganz catheter noted on prior exam has been removed. Mediastinal drain remains. No pneumothorax is noted. Minimal right pleural effusion is noted. 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  IMPRESSION: Minimal right pleural effusion.  No pneumothorax is seen.   Electronically Signed   By: Sabino Dick M.D.   On: 11/21/2013 07:57   Scheduled Meds: .  aspirin EC  81 mg Oral Daily  . atorvastatin  20 mg Oral QHS  . Chlorhexidine Gluconate Cloth  6 each Topical Q0600  . docusate sodium  200 mg Oral Daily  . ferrous gluconate  324 mg Oral Q breakfast  . furosemide  40 mg Oral Daily  . hydrocortisone  25 mg Rectal QHS  . insulin aspart  0-24 Units Subcutaneous TID AC & HS  . insulin detemir  20 Units Subcutaneous Daily  . metoprolol tartrate  12.5 mg Oral BID  . mupirocin ointment   Nasal BID  . pantoprazole  40 mg Oral QAC breakfast  . potassium chloride  20 mEq Oral Daily  . sodium chloride  3 mL Intravenous Q12H    Continuous Infusions:  PRN Meds:.sodium chloride, acetaminophen, bisacodyl, bisacodyl, ondansetron (ZOFRAN) IV, ondansetron, oxyCODONE, sodium chloride, traMADol    Assessment/Plan: S/P Procedure(s) (LRB): AORTIC VALVE REPLACEMENT (AVR) (N/A) INTRAOPERATIVE TRANSESOPHAGEAL ECHOCARDIOGRAM (N/A)  1 hemodyn stable with some hypertension. In sinus rhythm. Was on lisinopril/HCTZ at home, will add 10 mg lisinopril now to start 2 ABL anemia -improved. Thrombocytopenia is stable 3 sugars stable- will restart glucophage and d/c insulin 4 cont gentle diuresis 5 push routine rehab/pulm toilet   LOS: 3 days    GOLD,WAYNE E 11/22/2013   Chart reviewed, patient examined, agree with above.

## 2013-11-22 NOTE — Progress Notes (Signed)
Ambulated in hallway with 1+ assist using rolling walker 520 feet, tolerated well. 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

## 2013-11-22 NOTE — Discharge Instructions (Signed)
Aortic Valve Replacement Care After Refer to this sheet in the next few weeks. These instructions provide you with information on caring for yourself after your procedure. Your health care provider may also give you specific instructions. Your treatment has been planned according to current medical practices, but problems sometimes occur. Call your health care provider if you have any problems or questions after your procedure. HOME CARE INSTRUCTIONS   Only take over-the-counter or prescription medicines as directed by your health care provider.  If your health care provider has prescribed elastic stockings, wear them as directed.  Take frequent naps or rest often throughout the day.  Avoid lifting over 10 lbs (4.5 kg) or pushing or pulling things with your arms for 6-8 weeks or as directed by your health care provider.  Avoid driving or airplane travel for 4-6 weeks after surgery or as directed. If you are riding in a car for an extended period, stop every 1-2 hours to stretch your legs. Keep a record of your medicines and medical history with you when traveling. Do not drive while taking pain medicines (narcotics).  Do not cross your legs.  Do not use any tobacco products including cigarettes, chewing tobacco, or electronic cigarettes.  Do not bathe, swim, or use a hot tub until directed by your health care provider. Take showers once your health care provider approves. Pat incisions dry. Do not rub incisions with a washcloth or towel.  Avoid climbing stairs and using the handrail to pull yourself up for the first 2-3 weeks after surgery.  Return to work as directed by your health care provider.  Drink enough fluids to keep your urine clear or pale yellow.  Do not strain to have a bowel movement. Eat high-fiber foods if you become constipated. You may also take a medicine to help you have a bowel movement (laxative) as directed by your health care provider.  Resume sexual activity as  directed by your health care provider. Men should not use medicines for erectile dysfunction until their doctor says it isokay.  If you had a certain type of heart condition in the past, you may need to take antibiotic medicine before having dental work or surgery. Let your dentist and health care providers know if you had one or more of the following:  Previous endocarditis.  An artificial (prosthetic) heart valve.  Congenital heart disease. SEEK MEDICAL CARE IF:  You develop a skin rash.   You experience sudden changes in your weight. SEEK IMMEDIATE MEDICAL CARE IF:   You develop chest pain that is not coming from your incision.   You develop shortness of breath or have difficulty breathing.   You have a fever.   You have increased bleeding from your wounds.   You have increasing wound pain.   You have redness or swelling around your wounds  You have pus coming from your wound.   You develop lightheadedness.  MAKE SURE YOU:   Understand these directions.  Will watch your condition.  Will get help right away if you are not doing well or get worse. Document Released: 11/11/2004 Document Revised: 04/30/2013 Document Reviewed: 02/07/2012 [facility] Patient Information 2015 [place], [place]. This information is not intended to replace advice given to you by your health care provider. Make sure you discuss any questions you have with your health care provider.

## 2013-11-23 LAB — GLUCOSE, CAPILLARY
GLUCOSE-CAPILLARY: 153 mg/dL — AB (ref 70–99)
Glucose-Capillary: 129 mg/dL — ABNORMAL HIGH (ref 70–99)
Glucose-Capillary: 84 mg/dL (ref 70–99)
Glucose-Capillary: 92 mg/dL (ref 70–99)

## 2013-11-23 MED ORDER — LACTULOSE 10 GM/15ML PO SOLN
20.0000 g | Freq: Every day | ORAL | Status: DC | PRN
  Administered 2013-11-23: 20 g via ORAL
  Filled 2013-11-23: qty 30

## 2013-11-23 NOTE — Progress Notes (Signed)
Patient walked 600  feet with rolling walker, and assist x 1. Tolerated well. Will let us know if he needs a rolling walker for dc Joylene Draft A

## 2013-11-23 NOTE — Progress Notes (Signed)
Pt ambulated around the unit with family using front wheel walker on RA. Pt tolerated walk well and had no complaints of pain. Pt back in chair. Will continue to monitor.

## 2013-11-23 NOTE — Progress Notes (Signed)
EPW removed per protocol, wires intact patient tolerated well. Instructed bedrest for 1 hour, 131/69 HR 87. Will continue to monitor closely [name]

## 2013-11-23 NOTE — Progress Notes (Addendum)
4 Days Post-Op Procedure(s) (LRB): AORTIC VALVE REPLACEMENT (AVR) (N/A) INTRAOPERATIVE TRANSESOPHAGEAL ECHOCARDIOGRAM (N/A) Subjective: Only c/o is constipation  Objective: Vital signs in last 24 hours: Temp:  [98.4 F (36.9 C)-98.9 F (37.2 C)] 98.9 F (37.2 C) (07/18 0400) Pulse Rate:  [88-99] 88 (07/18 0400) Cardiac Rhythm:  [-] Normal sinus rhythm (07/17 2117) Resp:  [19-20] 19 (07/18 0400) BP: (104-127)/(59-68) 104/67 mmHg (07/18 0400) SpO2:  [96 %-97 %] 97 % (07/18 0400) Weight:  [143 lb 11.2 oz (65.182 kg)] 143 lb 11.2 oz (65.182 kg) (07/18 0400)  Hemodynamic parameters for last 24 hours:    Intake/Output from previous day: 07/17 0701 - 07/18 0700 In: 720 [P.O.:720] Out: 950 [Urine:950] Intake/Output this shift:    General appearance: alert, cooperative and no distress Heart: regular rate and rhythm and 2/6 systolic murmur Lungs: clear to auscultation bilaterally Abdomen: soft, nontender, non-distended Extremities: no sig edema Wound: incis healing well  Lab Results:  Recent Labs  11/21/13 0420 11/22/13 0350  WBC 7.3 6.2  HGB 7.8* 8.4*  HCT 22.7* 24.4*  PLT 95* 94*   BMET:  Recent Labs  11/21/13 0420 11/22/13 0350  NA 138 135*  K 4.6 4.3  CL 102 99  CO2 24 24  GLUCOSE 123* 104*  BUN 26* 24*  CREATININE 1.09 0.94  CALCIUM 8.3* 8.4    PT/INR: No results found for this basename: LABPROT, INR,  in the last 72 hours ABG    Component Value Date/Time   PHART 7.347* 11/20/2013 0045   HCO3 23.7 11/20/2013 0045   TCO2 23 11/20/2013 1808   ACIDBASEDEF 2.0 11/20/2013 0045   O2SAT 97.0 11/20/2013 0045   CBG (last 3)   Recent Labs  11/22/13 1625 11/22/13 2036 11/23/13 0552  GLUCAP 120* 150* 129*   Dg Chest 2 View  11/22/2013   CLINICAL DATA:  Status post CABG with shortness of breath and chest discomfort  EXAM: CHEST  2 VIEW  COMPARISON:  Portable chest x-ray of 21 November 2013  FINDINGS: The lungs are reasonably well inflated. There is persistent  density at the lung bases especially on the right consistent with subsegmental atelectasis and a small amount of fluid. The cardiac silhouette remains enlarged. The pulmonary vascularity is not engorged. The right internal jugular Cordis sheath and the mediastinal drains have been removed. There are 7 intact sternal wires present. There is a valve ring in the aortic position. There is degenerative change of both shoulders.  IMPRESSION: There has not been significant interval change in the appearance of the chest since yesterday's study. There has been interval removal of support tubes and lines.   Electronically Signed   By: David  Martinique   On: 11/22/2013 07:52   Scheduled Meds: . aspirin EC  81 mg Oral Daily  . atorvastatin  20 mg Oral QHS  . Chlorhexidine Gluconate Cloth  6 each Topical Q0600  . docusate sodium  200 mg Oral Daily  . ferrous gluconate  324 mg Oral Q breakfast  . furosemide  40 mg Oral Daily  . hydrocortisone  25 mg Rectal QHS  . insulin aspart  0-24 Units Subcutaneous TID AC & HS  . lisinopril  10 mg Oral Daily  . metFORMIN  1,000 mg Oral Q breakfast  . metFORMIN  1,500 mg Oral Q supper  . metoprolol tartrate  12.5 mg Oral BID  . mupirocin ointment   Nasal BID  . pantoprazole  40 mg Oral QAC breakfast  . potassium chloride  20  mEq Oral Daily  . sodium chloride  3 mL Intravenous Q12H   Continuous Infusions:  PRN Meds:.sodium chloride, acetaminophen, bisacodyl, bisacodyl, ondansetron (ZOFRAN) IV, ondansetron, oxyCODONE, sodium chloride, traMADol    Assessment/Plan: S/P Procedure(s) (LRB): AORTIC VALVE REPLACEMENT (AVR) (N/A) INTRAOPERATIVE TRANSESOPHAGEAL ECHOCARDIOGRAM (N/A)  1 hemodyn stable in sinus rhythm, BP better controlled on lisinopril 2 sugars well controlled- back on metformin 3 no new labs today 4 add lactulose prn 5 d/c epw's  LOS: 4 days    GOLD,WAYNE E 11/23/2013   Chart reviewed, patient examined, agree with above. He is doing well but needs to  have BM. He just got lactulose. Plan home tomorrow if bowels move.

## 2013-11-23 NOTE — Progress Notes (Signed)
Pt ambulated independantly in hallway gait steady, did not require rolling walker Joylene Draft A

## 2013-11-23 NOTE — Progress Notes (Signed)
CARDIAC REHAB PHASE I   PRE:  Rate/Rhythm: 88 SR    BP: sitting 122/70    SaO2: 97 RA  MODE:  Ambulation: 800 ft   POST:  Rate/Rhythm: 98 SR    BP: sitting 130/70     SaO2: 95 RA  Tolerated very well. Steady without RW, will not need one for d/c. No c/o. Awaiting BM. [place], [place], ACSM 11/23/2013 2:07 PM

## 2013-11-24 LAB — GLUCOSE, CAPILLARY: Glucose-Capillary: 114 mg/dL — ABNORMAL HIGH (ref 70–99)

## 2013-11-24 MED ORDER — OXYCODONE HCL 5 MG PO TABS: 5.0000 mg | ORAL_TABLET | ORAL | Status: DC | PRN

## 2013-11-24 MED ORDER — METOPROLOL TARTRATE 12.5 MG HALF TABLET: 12.5000 mg | ORAL_TABLET | Freq: Two times a day (BID) | ORAL | Status: DC

## 2013-11-24 MED ORDER — FERROUS GLUCONATE 324 (38 FE) MG PO TABS: 324.0000 mg | ORAL_TABLET | Freq: Every day | ORAL | Status: DC

## 2013-11-24 MED ORDER — ASPIRIN 81 MG PO TBEC: 81.0000 mg | DELAYED_RELEASE_TABLET | Freq: Every day | ORAL | Status: DC

## 2013-11-24 NOTE — Progress Notes (Signed)
[address]       [place] [address]             [phone]      5 Days Post-Op Procedure(s) (LRB): AORTIC VALVE REPLACEMENT (AVR) (N/A) INTRAOPERATIVE TRANSESOPHAGEAL ECHOCARDIOGRAM (N/A) Subjective: Feels ok, + BM  Objective: Vital signs in last 24 hours: Temp:  [97.5 F (36.4 C)-99.6 F (37.6 C)] 98.2 F (36.8 C) (07/19 0445) Pulse Rate:  [83-96] 83 (07/19 0445) Cardiac Rhythm:  [-] Normal sinus rhythm (07/18 2009) Resp:  [17-20] 17 (07/19 0445) BP: (114-142)/(65-77) 142/77 mmHg (07/19 0445) SpO2:  [95 %-98 %] 95 % (07/19 0445) Weight:  [143 lb 9.6 oz (65.137 kg)] 143 lb 9.6 oz (65.137 kg) (07/19 0445)  Hemodynamic parameters for last 24 hours:    Intake/Output from previous day: 07/18 0701 - 07/19 0700 In: 720 [P.O.:720] Out: 650 [Urine:650] Intake/Output this shift:    General appearance: alert, cooperative and no distress Heart: regular rate and rhythm and 2/6 sys murmur Lungs: clear to auscultation bilaterally Abdomen: benign Extremities: no edema Wound: incis healing well  Lab Results:  Recent Labs  11/22/13 0350  WBC 6.2  HGB 8.4*  HCT 24.4*  PLT 94*   BMET:  Recent Labs  11/22/13 0350  NA 135*  K 4.3  CL 99  CO2 24  GLUCOSE 104*  BUN 24*  CREATININE 0.94  CALCIUM 8.4    PT/INR: No results found for this basename: LABPROT, INR,  in the last 72 hours ABG    Component Value Date/Time   PHART 7.347* 11/20/2013 0045   HCO3 23.7 11/20/2013 0045   TCO2 23 11/20/2013 1808   ACIDBASEDEF 2.0 11/20/2013 0045   O2SAT 97.0 11/20/2013 0045   CBG (last 3)   Recent Labs  11/23/13 1609 11/23/13 2113 11/24/13 0609  GLUCAP 153* 92 114*  Dg Chest 2 View  11/22/2013   CLINICAL DATA:  Status post CABG with shortness of breath and chest discomfort  EXAM: CHEST  2 VIEW  COMPARISON:  Portable chest x-ray of 21 November 2013  FINDINGS: The lungs are reasonably well inflated. There is persistent density at the lung bases especially  on the right consistent with subsegmental atelectasis and a small amount of fluid. The cardiac silhouette remains enlarged. The pulmonary vascularity is not engorged. The right internal jugular Cordis sheath and the mediastinal drains have been removed. There are 7 intact sternal wires present. There is a valve ring in the aortic position. There is degenerative change of both shoulders.  IMPRESSION: There has not been significant interval change in the appearance of the chest since yesterday's study. There has been interval removal of support tubes and lines.   Electronically Signed   By: David  Martinique   On: 11/22/2013 07:52   Dg Chest Port 1 View  11/21/2013   CLINICAL DATA:  Status post cardiac surgery.  EXAM: PORTABLE CHEST - 1 VIEW  COMPARISON:  November 20, 2013.  FINDINGS: Stable cardiomediastinal silhouette. Right internal jugular swans Ganz catheter noted on prior exam has been removed. Mediastinal drain remains. No pneumothorax is noted. Minimal right pleural effusion is noted. 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  IMPRESSION: Minimal right pleural effusion.  No pneumothorax is seen.   Electronically Signed   By: Sabino Dick M.D.   On: 11/21/2013 07:57   Scheduled Meds: . aspirin EC  81 mg Oral Daily  . atorvastatin  20 mg Oral QHS  .  Chlorhexidine Gluconate Cloth  6 each Topical Q0600  . docusate sodium  200 mg Oral Daily  . ferrous gluconate  324 mg Oral Q breakfast  . furosemide  40 mg Oral Daily  . hydrocortisone  25 mg Rectal QHS  . insulin aspart  0-24 Units Subcutaneous TID AC & HS  . lisinopril  10 mg Oral Daily  . metFORMIN  1,000 mg Oral Q breakfast  . metFORMIN  1,500 mg Oral Q supper  . metoprolol tartrate  12.5 mg Oral BID  . mupirocin ointment   Nasal BID  . pantoprazole  40 mg Oral QAC breakfast  . potassium chloride  20 mEq Oral Daily  . sodium chloride  3 mL Intravenous Q12H   Continuous Infusions:  PRN Meds:.sodium chloride,  acetaminophen, bisacodyl, bisacodyl, lactulose, ondansetron (ZOFRAN) IV, ondansetron, oxyCODONE, sodium chloride, traMADol    Assessment/Plan: S/P Procedure(s) (LRB): AORTIC VALVE REPLACEMENT (AVR) (N/A) INTRAOPERATIVE TRANSESOPHAGEAL ECHOCARDIOGRAM (N/A) Plan for discharge: see discharge orders   LOS: 5 days    GOLD,WAYNE E 11/24/2013

## 2013-11-24 NOTE — Progress Notes (Signed)
Reviewed discharge instructions with patient and his wife, verbalized understanding, questions answered. Dc home with wife, belongings, instructions and prescriptions [name]

## 2013-11-29 ENCOUNTER — Ambulatory Visit (INDEPENDENT_AMBULATORY_CARE_PROVIDER_SITE_OTHER): Payer: Self-pay | Admitting: *Deleted

## 2013-11-29 VITALS — BP 129/82 | HR 65 | Resp 16

## 2013-11-29 DIAGNOSIS — I359 Nonrheumatic aortic valve disorder, unspecified: Secondary | ICD-10-CM

## 2013-11-29 DIAGNOSIS — I350 Nonrheumatic aortic (valve) stenosis: Secondary | ICD-10-CM

## 2013-11-29 DIAGNOSIS — Z4802 Encounter for removal of sutures: Secondary | ICD-10-CM

## 2013-11-29 NOTE — Progress Notes (Signed)
[name] is s/p AVR 11/29/13 and returns for suture removal of his chest tube sutures. Two sutures were easily removed and these areas as well as the sternal incision are very well healed. His appetite is good and bowels are normal. He is using pain med infrequently.  He does c/o some lightheadedness particularly in the early morning when gettiing up but even when sitting and seeing flecks in his eyes.  V.S. Are stable.  I suggested getting up more slowly, give his eyes more time to adjust.  But if this persists, he should notify his cardiologist and he agreed. We will see him back as scheduled with a chest xray.

## 2013-12-10 DIAGNOSIS — E119 Type 2 diabetes mellitus without complications: Secondary | ICD-10-CM | POA: Diagnosis not present

## 2013-12-10 DIAGNOSIS — I359 Nonrheumatic aortic valve disorder, unspecified: Secondary | ICD-10-CM | POA: Diagnosis not present

## 2013-12-10 DIAGNOSIS — Z954 Presence of other heart-valve replacement: Secondary | ICD-10-CM | POA: Diagnosis not present

## 2013-12-10 DIAGNOSIS — I10 Essential (primary) hypertension: Secondary | ICD-10-CM | POA: Diagnosis not present

## 2013-12-19 ENCOUNTER — Ambulatory Visit (INDEPENDENT_AMBULATORY_CARE_PROVIDER_SITE_OTHER): Payer: Medicare Other | Admitting: Physician Assistant

## 2013-12-19 ENCOUNTER — Encounter: Payer: Self-pay | Admitting: Physician Assistant

## 2013-12-19 VITALS — BP 132/80 | HR 72 | Ht 71.0 in | Wt 136.0 lb

## 2013-12-19 DIAGNOSIS — I2510 Atherosclerotic heart disease of native coronary artery without angina pectoris: Secondary | ICD-10-CM

## 2013-12-19 DIAGNOSIS — D500 Iron deficiency anemia secondary to blood loss (chronic): Secondary | ICD-10-CM | POA: Diagnosis not present

## 2013-12-19 DIAGNOSIS — K2990 Gastroduodenitis, unspecified, without bleeding: Secondary | ICD-10-CM | POA: Diagnosis not present

## 2013-12-19 DIAGNOSIS — E785 Hyperlipidemia, unspecified: Secondary | ICD-10-CM

## 2013-12-19 DIAGNOSIS — K2970 Gastritis, unspecified, without bleeding: Secondary | ICD-10-CM | POA: Diagnosis not present

## 2013-12-19 DIAGNOSIS — Z952 Presence of prosthetic heart valve: Secondary | ICD-10-CM

## 2013-12-19 DIAGNOSIS — I359 Nonrheumatic aortic valve disorder, unspecified: Secondary | ICD-10-CM

## 2013-12-19 DIAGNOSIS — Z954 Presence of other heart-valve replacement: Secondary | ICD-10-CM

## 2013-12-19 NOTE — Patient Instructions (Signed)
Your physician recommends that you continue on your current medications as directed. Please refer to the Current Medication list given to you today.  Your physician has requested that you have an echocardiogram. Echocardiography is a painless test that uses sound waves to create images of your heart. It provides your doctor with information about the size and shape of your heart and how well your heart's chambers and valves are working. This procedure takes approximately one hour. There are no restrictions for this procedure.  You have been referred to  [facility]  Your physician recommends that you schedule a follow-up appointment in: 2 MONTHS WITH DR. Marlou Porch

## 2013-12-19 NOTE — Progress Notes (Signed)
Cardiology Office Note    Date:  12/19/2013   ID:  [name], DOB 1940/08/21, MRN [id number]  PCP:  Lujean Amel, MD  Cardiologist:  Dr. Candee Furbish    History of Present Illness: [name] is a 73 y.o. male with a hx of aortic stenosis, DM, HTN, HL, HCV, CAD, prior GI bleeding.  Recent echocardiogram demonstrated severe aortic stenosis. Cardiac catheterization was performed and demonstrated minor luminal irregularities. He was referred for aortic valve replacement. He underwent bioprosthetic AVR with Dr. Cyndia Bent 11/19/13. Postoperative course was fairly uneventful. He did require transfusion with PRBCs. He remained in sinus rhythm. He returns for follow up. Chest remains somewhat sore. He denies significant dyspnea, orthopnea, PND or edema. He denies syncope. He denies fevers or cough.   Studies:  - LHC (5/15):  Minor luminal irregularities  - Echo (4/15):  Severe LVH, EF 60-65%, normal wall motion, grade 2 diastolic dysfunction, severe aortic stenosis (mean 61 mm Hg), mild AI, moderate to severe LAE  - Carotid US (7/15):  Bilateral ICA 1-39%   Recent Labs/Images: 11/14/2013: ALT 102*  11/22/2013: Creatinine 0.94; Hemoglobin 8.4*; Potassium 4.3   Dg Chest 2 View  11/14/2013   CLINICAL DATA:  Hypertension.  Pre-admission for heart surgery.  EXAM: CHEST  2 VIEW  COMPARISON:  04/22/2013  FINDINGS: Normal heart, mediastinum hila. Clear lungs. No pleural effusion. No pneumothorax.  Bony thorax is intact.  IMPRESSION: No active cardiopulmonary disease.   Electronically Signed   By: Lajean Manes M.D.   On: 11/14/2013 14:42     Wt Readings from Last 3 Encounters:  11/24/13 143 lb 9.6 oz (65.137 kg)  11/24/13 143 lb 9.6 oz (65.137 kg)  11/14/13 139 lb 9.6 oz (63.322 kg)     Past Medical History  Diagnosis Date  . Essential hypertension, benign   . Diabetes mellitus without complication   . Hep C w/o coma, chronic   . Hypercholesterolemia   . ED (erectile dysfunction)   .  History of DVT of lower extremity   . Acute epididymo-orchitis   . Aortic valve disorders   . Heart murmur   . History of blood transfusion   . Arthritis     Current Outpatient Prescriptions  Medication Sig Dispense Refill  . aspirin EC 81 MG EC tablet Take 1 tablet (81 mg total) by mouth daily.      Marland Kitchen atorvastatin (LIPITOR) 20 MG tablet Take 20 mg by mouth at bedtime.      . ferrous gluconate (FERGON) 324 MG tablet Take 1 tablet (324 mg total) by mouth daily with breakfast.  30 tablet  3  . lisinopril-hydrochlorothiazide (PRINZIDE,ZESTORETIC) 20-25 MG per tablet Take 1 tablet by mouth daily.      . metFORMIN (GLUCOPHAGE) 500 MG tablet Take 1,000-1,500 mg by mouth 2 (two) times daily with a meal. Take 1000mg  in the morning and 1500 in the evening      . metoprolol tartrate (LOPRESSOR) 12.5 mg TABS tablet Take 0.5 tablets (12.5 mg total) by mouth 2 (two) times daily.  60 each  1  . oxyCODONE (OXY IR/ROXICODONE) 5 MG immediate release tablet Take 1-2 tablets (5-10 mg total) by mouth every 4 (four) hours as needed for moderate pain.  50 tablet  0  . pantoprazole (PROTONIX) 40 MG tablet Take 40 mg by mouth daily.       No current facility-administered medications for this visit.     Allergies:   Crestor and Vytorin   Social  History:  The patient  reports that he quit smoking about 27 years ago. His smoking use included Cigarettes. He smoked 0.00 packs per day. He has never used smokeless tobacco. He reports that he does not drink alcohol or use illicit drugs.   Family History:  The patient's family history includes Diabetes in his father and mother; Hypertension in an other family member; Stroke in his father and mother.   ROS:  Please see the history of present illness.      All other systems reviewed and negative.   PHYSICAL EXAM: VS:  There were no vitals taken for this visit. Well nourished, well developed, in no acute distress HEENT: normal Neck: no JVD Cardiac:  normal S1, S2;  RRR; 1/6 systolic murmur heard best at the RUSB Chest:  Median sternotomy wound well healed without erythema or discharge Lungs:   clear to auscultation bilaterally, no wheezing, rhonchi or rales Abd: soft, nontender, no hepatomegaly Ext:  no edema Skin: warm and dry Neuro:  CNs 2-12 intact, no focal abnormalities noted  EKG:  NSR, HR 72, normal axis, LVH with repolarization abnormality, no change from prior tracing     ASSESSMENT AND PLAN:  Aortic valve disorders S/P AVR (aortic valve replacement):  He is progressing well after recent aortic valve replacement. We discussed the importance of SBE prophylaxis. I will refer him to cardiac rehabilitation. I will arrange follow up echocardiogram.  Iron deficiency anemia due to chronic blood loss:  Continue iron. Followup with primary care.  Unspecified gastritis and gastroduodenitis without mention of hemorrhage:  He questioned if he could come off Protonix. I reviewed his chart today. I have asked him to follow up with gastroenterology. He had an EGD in 04/2013 with some gastritis. There was a question of chronic GI blood loss at one point.  I would rather he consult with GI prior to DC the Protonix.    Hyperlipidemia:  Continue statin.  Disposition:  FU with Dr. Candee Furbish in 2 mos.    Signed, Versie Starks, MHS 12/19/2013 2:44 PM    [facility], [place], [place]  [address] Phone: [phone]; Fax: [phone]

## 2013-12-20 ENCOUNTER — Other Ambulatory Visit: Payer: Self-pay | Admitting: Surgery

## 2013-12-20 DIAGNOSIS — I359 Nonrheumatic aortic valve disorder, unspecified: Secondary | ICD-10-CM

## 2013-12-25 ENCOUNTER — Encounter: Payer: Self-pay | Admitting: Surgery

## 2013-12-25 ENCOUNTER — Ambulatory Visit
Admission: RE | Admit: 2013-12-25 | Discharge: 2013-12-25 | Disposition: A | Discharge status: Home / Self Care | Payer: Medicare Other | Source: Ambulatory Visit | Attending: Surgery | Admitting: Surgery

## 2013-12-25 ENCOUNTER — Ambulatory Visit (INDEPENDENT_AMBULATORY_CARE_PROVIDER_SITE_OTHER): Payer: Self-pay | Admitting: Surgery

## 2013-12-25 VITALS — BP 147/87 | HR 70 | Ht 71.0 in | Wt 136.0 lb

## 2013-12-25 DIAGNOSIS — I359 Nonrheumatic aortic valve disorder, unspecified: Secondary | ICD-10-CM | POA: Diagnosis not present

## 2013-12-25 DIAGNOSIS — Z954 Presence of other heart-valve replacement: Secondary | ICD-10-CM

## 2013-12-25 DIAGNOSIS — Z952 Presence of prosthetic heart valve: Secondary | ICD-10-CM

## 2013-12-25 DIAGNOSIS — I350 Nonrheumatic aortic (valve) stenosis: Secondary | ICD-10-CM

## 2013-12-25 NOTE — Progress Notes (Signed)
      HPI:  Patient returns for routine postoperative follow-up having undergone AVR on 11/19/2013. The patient's early postoperative recovery while in the hospital was notable for an uncomplicated postop course. Since hospital discharge the patient reports that he has been feeling well. He has been ambulating without chest pain or shortness of breath.   Current Outpatient Prescriptions  Medication Sig Dispense Refill  . aspirin EC 81 MG EC tablet Take 1 tablet (81 mg total) by mouth daily.      Marland Kitchen atorvastatin (LIPITOR) 20 MG tablet Take 20 mg by mouth at bedtime.      . ferrous gluconate (FERGON) 324 MG tablet Take 1 tablet (324 mg total) by mouth daily with breakfast.  30 tablet  3  . lisinopril-hydrochlorothiazide (PRINZIDE,ZESTORETIC) 20-25 MG per tablet Take 1 tablet by mouth daily.      . metFORMIN (GLUCOPHAGE) 500 MG tablet Take 1,000-1,500 mg by mouth 2 (two) times daily with a meal. Take 1000mg  in the morning and 1500 in the evening      . metoprolol tartrate (LOPRESSOR) 12.5 mg TABS tablet Take 0.5 tablets (12.5 mg total) by mouth 2 (two) times daily.  60 each  1  . pantoprazole (PROTONIX) 40 MG tablet Take 40 mg by mouth daily.      Marland Kitchen oxyCODONE (OXY IR/ROXICODONE) 5 MG immediate release tablet Take 1-2 tablets (5-10 mg total) by mouth every 4 (four) hours as needed for moderate pain.  50 tablet  0   No current facility-administered medications for this visit.    Physical Exam: BP 147/87  Pulse 70  Ht 5\' 11"  (1.803 m)  Wt 136 lb (61.689 kg)  BMI 18.98 kg/m2  SpO2 98% He looks well. Lung exam is clear. Cardiac exam shows a regular rate and rhythm with normal heart sounds. Chest incision is healing well and sternum is stable.   Diagnostic Tests:  CLINICAL DATA: Aortic valve disease  EXAM:  CHEST 2 VIEW  COMPARISON: November 22, 2013  FINDINGS:  There is no edema or consolidation. The heart is upper normal in  size with pulmonary vascularity within normal limits.  Patient is  status post aortic valve replacement. No adenopathy. No  pneumothorax. There is degenerative change in the shoulders.  IMPRESSION:  No edema or consolidation. Heart upper normal in size with cardiac  contour within normal limits. Aortic valve replacement present.  Electronically Signed  By: Lowella Grip M.D.  On: 12/25/2013 11:11   Impression:  Overall I think he is doing well. I encouraged him to continue walking. He is planning to participate in cardiac rehab. I told him he could drive his car but should not lift anything heavier than 10 lbs for three months postop.   Plan:  He will continue to follow up with cardiology and will contact me if he develops any problems with his incisions.

## 2013-12-26 ENCOUNTER — Ambulatory Visit (HOSPITAL_COMMUNITY): Payer: Medicare Other | Attending: Physician Assistant | Admitting: Radiology

## 2013-12-26 DIAGNOSIS — Z954 Presence of other heart-valve replacement: Secondary | ICD-10-CM | POA: Diagnosis not present

## 2013-12-26 DIAGNOSIS — I359 Nonrheumatic aortic valve disorder, unspecified: Secondary | ICD-10-CM | POA: Diagnosis not present

## 2013-12-26 NOTE — Progress Notes (Signed)
Echocardiogram performed.  

## 2013-12-29 ENCOUNTER — Encounter: Payer: Self-pay | Admitting: Physician Assistant

## 2014-01-02 ENCOUNTER — Encounter (HOSPITAL_COMMUNITY)
Admission: RE | Admit: 2014-01-02 | Discharge: 2014-01-02 | Disposition: A | Discharge status: Home / Self Care | Payer: Medicare Other | Source: Ambulatory Visit | Attending: Cardiology | Admitting: Cardiology

## 2014-01-02 DIAGNOSIS — Z5189 Encounter for other specified aftercare: Secondary | ICD-10-CM | POA: Insufficient documentation

## 2014-01-02 DIAGNOSIS — Z954 Presence of other heart-valve replacement: Secondary | ICD-10-CM | POA: Insufficient documentation

## 2014-01-02 DIAGNOSIS — E785 Hyperlipidemia, unspecified: Secondary | ICD-10-CM | POA: Insufficient documentation

## 2014-01-02 DIAGNOSIS — I359 Nonrheumatic aortic valve disorder, unspecified: Secondary | ICD-10-CM | POA: Insufficient documentation

## 2014-01-02 DIAGNOSIS — I2510 Atherosclerotic heart disease of native coronary artery without angina pectoris: Secondary | ICD-10-CM | POA: Insufficient documentation

## 2014-01-02 NOTE — Progress Notes (Signed)
Cardiac Rehab Medication Review by a Pharmacist  Does the patient  feel that his/her medications are working for him/her?  yes  Has the patient been experiencing any side effects to the medications prescribed?  yes  Does the patient measure his/her own blood pressure or blood glucose at home?  occasionally  Does the patient have any problems obtaining medications due to transportation or finances?   no  Understanding of regimen: good Understanding of indications: good Potential of compliance: excellent    Pharmacist comments: Pleasant 73 yo M with a good understanding of regimen and why he takes each. Wife was present for discussion. Patient gets very restless at night and believe it is due to his statin therapy. He has tried Vytorin and Crestor in the past with the same side effects. Pt also expressed concern for why he is on Protonix, originally prescribed for GI bleed but now resolved and he is concerned for any liver side effects. Pt was told to follow up with his GI MD with these concerns. Pt and wife verbalized understanding.  Thank you for allowing pharmacy to be part of this patient's care team  Nyaisha Simao M. Seline Enzor, Pharm.D Clinical Pharmacy Resident Pager: [phone] 01/02/2014 .8:49 AM

## 2014-01-06 ENCOUNTER — Encounter (HOSPITAL_COMMUNITY)
Admission: RE | Admit: 2014-01-06 | Discharge: 2014-01-06 | Disposition: A | Discharge status: Home / Self Care | Payer: Medicare Other | Source: Ambulatory Visit | Attending: Cardiology | Admitting: Cardiology

## 2014-01-06 ENCOUNTER — Telehealth (HOSPITAL_COMMUNITY): Payer: Self-pay | Admitting: *Deleted

## 2014-01-06 DIAGNOSIS — E785 Hyperlipidemia, unspecified: Secondary | ICD-10-CM | POA: Diagnosis not present

## 2014-01-06 DIAGNOSIS — Z954 Presence of other heart-valve replacement: Secondary | ICD-10-CM | POA: Diagnosis not present

## 2014-01-06 DIAGNOSIS — Z5189 Encounter for other specified aftercare: Secondary | ICD-10-CM | POA: Diagnosis not present

## 2014-01-06 DIAGNOSIS — I359 Nonrheumatic aortic valve disorder, unspecified: Secondary | ICD-10-CM | POA: Diagnosis not present

## 2014-01-06 DIAGNOSIS — I2510 Atherosclerotic heart disease of native coronary artery without angina pectoris: Secondary | ICD-10-CM | POA: Diagnosis not present

## 2014-01-06 DIAGNOSIS — E119 Type 2 diabetes mellitus without complications: Secondary | ICD-10-CM | POA: Diagnosis not present

## 2014-01-06 LAB — GLUCOSE, CAPILLARY
GLUCOSE-CAPILLARY: 148 mg/dL — AB (ref 70–99)
Glucose-Capillary: 201 mg/dL — ABNORMAL HIGH (ref 70–99)

## 2014-01-06 NOTE — Telephone Encounter (Signed)
Received message from [facility] that his prescription had been received and filled.  Called and relayed message to pt's wife.  Advised to go ahead and take his medication as soon as he received it.  Verbalized understanding.

## 2014-01-06 NOTE — Progress Notes (Signed)
Pt in today for his first day of exercise at the 9:45 cardiac rehab phase II Program.  Pt with elevation in BP pre exercise that returned to [facility] with rest.  Pt reports that he has been out of his bp medication -Lisinopril/HCTZ since Thursday.  Pt stated that the pharmacy sent a refill request to his primary MD Dr. Tonny Bollman at [facility].  Called to his primary MD and was told a fax was sent but showed that it failed and therefore was not received by pt pharmacy - [facility] on [address].  Plan to refax the refill request back to [facility].  Pt able to proceed with light exercise with no complaints and his bp remained WNL.  Monitor showed SR with ST depression and t wave inversion. This is present on his most recent 12 lead EKG s/p AVR on 11/19/13. Medication list reconciled.  PHQ2 score 0. Pt feels positive about his recovery and has a good support system.  Pt's goals for the exercise program is to be more active and his long term goal is to be more active and continue exercise at the [facility].  This is achievable goal for pt.  Will plan to monitor MET levels, attend education classes on Wednesdays and Fridays and review home exercise.  Continue to monitor pt progress toward meeting these goals. Cherre Huger, BSN

## 2014-01-08 ENCOUNTER — Encounter (HOSPITAL_COMMUNITY)
Admission: RE | Admit: 2014-01-08 | Discharge: 2014-01-08 | Disposition: A | Discharge status: Home / Self Care | Payer: Medicare Other | Source: Ambulatory Visit | Attending: Cardiology | Admitting: Cardiology

## 2014-01-08 DIAGNOSIS — Z954 Presence of other heart-valve replacement: Secondary | ICD-10-CM | POA: Insufficient documentation

## 2014-01-08 DIAGNOSIS — Z5189 Encounter for other specified aftercare: Secondary | ICD-10-CM | POA: Insufficient documentation

## 2014-01-08 DIAGNOSIS — I2510 Atherosclerotic heart disease of native coronary artery without angina pectoris: Secondary | ICD-10-CM | POA: Diagnosis not present

## 2014-01-08 DIAGNOSIS — I359 Nonrheumatic aortic valve disorder, unspecified: Secondary | ICD-10-CM | POA: Insufficient documentation

## 2014-01-08 DIAGNOSIS — E785 Hyperlipidemia, unspecified: Secondary | ICD-10-CM | POA: Insufficient documentation

## 2014-01-08 LAB — GLUCOSE, CAPILLARY
GLUCOSE-CAPILLARY: 140 mg/dL — AB (ref 70–99)
GLUCOSE-CAPILLARY: 165 mg/dL — AB (ref 70–99)

## 2014-01-10 ENCOUNTER — Encounter (HOSPITAL_COMMUNITY)
Admission: RE | Admit: 2014-01-10 | Discharge: 2014-01-10 | Disposition: A | Discharge status: Home / Self Care | Payer: Medicare Other | Source: Ambulatory Visit | Attending: Cardiology | Admitting: Cardiology

## 2014-01-10 DIAGNOSIS — Z5189 Encounter for other specified aftercare: Secondary | ICD-10-CM | POA: Diagnosis not present

## 2014-01-10 LAB — GLUCOSE, CAPILLARY
Glucose-Capillary: 169 mg/dL — ABNORMAL HIGH (ref 70–99)
Glucose-Capillary: 130 mg/dL — ABNORMAL HIGH (ref 70–99)

## 2014-01-15 ENCOUNTER — Encounter (HOSPITAL_COMMUNITY)
Admission: RE | Admit: 2014-01-15 | Discharge: 2014-01-15 | Disposition: A | Discharge status: Home / Self Care | Payer: Medicare Other | Source: Ambulatory Visit | Attending: Cardiology | Admitting: Cardiology

## 2014-01-15 DIAGNOSIS — Z5189 Encounter for other specified aftercare: Secondary | ICD-10-CM | POA: Diagnosis not present

## 2014-01-15 LAB — GLUCOSE, CAPILLARY
Glucose-Capillary: 173 mg/dL — ABNORMAL HIGH (ref 70–99)
Glucose-Capillary: 95 mg/dL (ref 70–99)

## 2014-01-17 ENCOUNTER — Encounter (HOSPITAL_COMMUNITY)
Admission: RE | Admit: 2014-01-17 | Discharge: 2014-01-17 | Disposition: A | Discharge status: Home / Self Care | Payer: Medicare Other | Source: Ambulatory Visit | Attending: Cardiology | Admitting: Cardiology

## 2014-01-17 DIAGNOSIS — Z5189 Encounter for other specified aftercare: Secondary | ICD-10-CM | POA: Diagnosis not present

## 2014-01-17 LAB — GLUCOSE, CAPILLARY
GLUCOSE-CAPILLARY: 158 mg/dL — AB (ref 70–99)
Glucose-Capillary: 165 mg/dL — ABNORMAL HIGH (ref 70–99)

## 2014-01-20 ENCOUNTER — Encounter (HOSPITAL_COMMUNITY)
Admission: RE | Admit: 2014-01-20 | Discharge: 2014-01-20 | Disposition: A | Discharge status: Home / Self Care | Payer: Medicare Other | Source: Ambulatory Visit | Attending: Cardiology | Admitting: Cardiology

## 2014-01-20 DIAGNOSIS — Z5189 Encounter for other specified aftercare: Secondary | ICD-10-CM | POA: Diagnosis not present

## 2014-01-20 LAB — GLUCOSE, CAPILLARY
GLUCOSE-CAPILLARY: 280 mg/dL — AB (ref 70–99)
Glucose-Capillary: 211 mg/dL — ABNORMAL HIGH (ref 70–99)

## 2014-01-20 NOTE — Progress Notes (Signed)
[name] 73 y.o. male Nutrition Note Spoke with pt.  Nutrition Plan and Nutrition Survey goals reviewed with pt. Pt is following Step 2 of the Therapeutic Lifestyle Changes diet. Per EMR, pt with slow, chronic wt loss over the past 8 months. Pt has been trying to gain wt by "eating more and drinking Ensure Plus. Pt wt is up 1.4 kg from admission wt. Wt gain tips reviewed.  Pt is diabetic. Last A1c indicates blood glucose well-controlled. Pt is not currently checking his CBG's due to issues with his CBG strip delivery service and Medicare. Pt states his RN at his PCP office and Barnet Pall, RN are working on resolving this issue. Given pt CBG's so well controlled, ? If pt may benefit from a reduction in his Metformin. This Probation officer went over Diabetes Education test results. Pt expressed understanding of the information reviewed. Pt aware of nutrition education classes offered.  Nutrition Diagnosis   Food-and nutrition-related knowledge deficit related to lack of exposure to information as related to diagnosis of: ? CVD ? DM (A1c 5.7)   Increased energy expenditure related to increased energy requirements as evidenced by BMI <20 and recent h/o wt loss.   Nutrition RX/ Estimated Daily Nutrition Needs for: wt gain  2600-3100 Kcal, 85-100 gm fat, 17-20 gm sat fat, 2.5-3.1 gm trans-fat, <1500 mg sodium, 360-425 gm CHO   Nutrition Intervention   Pt's individual nutrition plan reviewed with pt.   Benefits of adopting Therapeutic Lifestyle Changes discussed when Medficts reviewed.   Pt to attend the Portion Distortion class   Pt to attend the  ? Nutrition I class - met; 01/07/14                    ? Nutrition II class        ? Diabetes Blitz class       ? Diabetes Q & A class - met; 01/17/14   Pt given handouts for: ? High Calorie, High Protein diet and recipes   Continue client-centered nutrition education by RD, as part of interdisciplinary care. Goal(s)   Pt to identify food quantities necessary  to achieve: ? wt gain to a goal wt of 142-165 lb (64.5-75 kg) at graduation from cardiac rehab.  Monitor and Evaluate progress toward nutrition goal with team. Nutrition Risk:  Change to Moderate [name], M.Ed, RD, LDN, CDE 01/20/2014 10:29 AM

## 2014-01-22 ENCOUNTER — Encounter (HOSPITAL_COMMUNITY)
Admission: RE | Admit: 2014-01-22 | Discharge: 2014-01-22 | Disposition: A | Discharge status: Home / Self Care | Payer: Medicare Other | Source: Ambulatory Visit | Attending: Cardiology | Admitting: Cardiology

## 2014-01-22 DIAGNOSIS — Z5189 Encounter for other specified aftercare: Secondary | ICD-10-CM | POA: Diagnosis not present

## 2014-01-22 LAB — GLUCOSE, CAPILLARY: Glucose-Capillary: 186 mg/dL — ABNORMAL HIGH (ref 70–99)

## 2014-01-22 NOTE — Progress Notes (Signed)
Reviewed home exercise with pt today.  Pt plans to walk and use stationary bike at home for exercise.  Pt also attends the [facility] for exercise.  Reviewed THR, pulse, RPE, sign and symptoms, and when to call 911 or MD.  Pt voiced understanding. Alberteen Sam, MA, ACSM RCEP

## 2014-01-24 ENCOUNTER — Encounter (HOSPITAL_COMMUNITY)
Admission: RE | Admit: 2014-01-24 | Discharge: 2014-01-24 | Disposition: A | Discharge status: Home / Self Care | Payer: Medicare Other | Source: Ambulatory Visit | Attending: Cardiology | Admitting: Cardiology

## 2014-01-24 ENCOUNTER — Other Ambulatory Visit: Payer: Self-pay | Admitting: *Deleted

## 2014-01-24 ENCOUNTER — Telehealth: Payer: Self-pay | Admitting: *Deleted

## 2014-01-24 DIAGNOSIS — Z5189 Encounter for other specified aftercare: Secondary | ICD-10-CM | POA: Diagnosis not present

## 2014-01-24 LAB — GLUCOSE, CAPILLARY
GLUCOSE-CAPILLARY: 85 mg/dL (ref 70–99)
GLUCOSE-CAPILLARY: 123 mg/dL — AB (ref 70–99)
Glucose-Capillary: 89 mg/dL (ref 70–99)

## 2014-01-24 MED ORDER — METOPROLOL TARTRATE 12.5 MG HALF TABLET: 12.5000 mg | ORAL_TABLET | Freq: Two times a day (BID) | ORAL | Status: DC

## 2014-01-24 MED ORDER — METOPROLOL TARTRATE 25 MG PO TABS: 12.5000 mg | ORAL_TABLET | Freq: Two times a day (BID) | ORAL | Status: DC

## 2014-01-24 NOTE — Progress Notes (Signed)
Blood sugar 81 this morning. Patient asymptomatic and ate breakfast. Patient given graham crackers and peanut butter and lemonade. Recheck blood sugar 89. [name] said he had a meeting and church last night and did not eat a lot for dinner. [name] did not exercise this morning per protocol. Dr Concepcion Living office called and notified of CBG's at cardiac rehab. Exit CBG 123.  Mance takes 1000 mg of metformin in the morning and 1500 mg of metformin. Exercise flow sheets from cardiac rehab with CBG's faxed to Dr Concepcion Living office for review. Will continue to monitor the patient throughout  the program.

## 2014-01-24 NOTE — Telephone Encounter (Signed)
Per Verdis Frederickson at Cardiac Rehab pt needs metoprolol refill.  Will send in to pharmacy on file.  Pt has appt with Dr Gillian Shields in 10/15

## 2014-01-27 ENCOUNTER — Encounter (HOSPITAL_COMMUNITY)
Admission: RE | Admit: 2014-01-27 | Discharge: 2014-01-27 | Disposition: A | Discharge status: Home / Self Care | Payer: Medicare Other | Source: Ambulatory Visit | Attending: Cardiology | Admitting: Cardiology

## 2014-01-27 DIAGNOSIS — Z5189 Encounter for other specified aftercare: Secondary | ICD-10-CM | POA: Diagnosis not present

## 2014-01-27 LAB — GLUCOSE, CAPILLARY
Glucose-Capillary: 275 mg/dL — ABNORMAL HIGH (ref 70–99)
Glucose-Capillary: 190 mg/dL — ABNORMAL HIGH (ref 70–99)

## 2014-01-29 ENCOUNTER — Encounter (HOSPITAL_COMMUNITY): Payer: Medicare Other

## 2014-01-29 DIAGNOSIS — Z23 Encounter for immunization: Secondary | ICD-10-CM | POA: Diagnosis not present

## 2014-01-31 ENCOUNTER — Encounter (HOSPITAL_COMMUNITY)
Admission: RE | Admit: 2014-01-31 | Discharge: 2014-01-31 | Disposition: A | Discharge status: Home / Self Care | Payer: Medicare Other | Source: Ambulatory Visit | Attending: Cardiology | Admitting: Cardiology

## 2014-01-31 DIAGNOSIS — Z5189 Encounter for other specified aftercare: Secondary | ICD-10-CM | POA: Diagnosis not present

## 2014-01-31 LAB — GLUCOSE, CAPILLARY: Glucose-Capillary: 195 mg/dL — ABNORMAL HIGH (ref 70–99)

## 2014-02-03 ENCOUNTER — Encounter (HOSPITAL_COMMUNITY)
Admission: RE | Admit: 2014-02-03 | Discharge: 2014-02-03 | Disposition: A | Discharge status: Home / Self Care | Payer: Medicare Other | Source: Ambulatory Visit | Attending: Cardiology | Admitting: Cardiology

## 2014-02-03 DIAGNOSIS — Z5189 Encounter for other specified aftercare: Secondary | ICD-10-CM | POA: Diagnosis not present

## 2014-02-03 NOTE — Progress Notes (Signed)
Intermittent exertional blood pressure elevations noted at cardiac rehab. Will fax exercise flow sheets to Dr. Marlou Porch office for review .  [name] is doing well with exercise and has been trying to gain weight. Will continue to monitor the patient throughout  the program.

## 2014-02-05 ENCOUNTER — Encounter (HOSPITAL_COMMUNITY)
Admission: RE | Admit: 2014-02-05 | Discharge: 2014-02-05 | Disposition: A | Discharge status: Home / Self Care | Payer: Medicare Other | Source: Ambulatory Visit | Attending: Cardiology | Admitting: Cardiology

## 2014-02-05 ENCOUNTER — Telehealth: Payer: Self-pay | Admitting: Cardiology

## 2014-02-05 DIAGNOSIS — Z5189 Encounter for other specified aftercare: Secondary | ICD-10-CM | POA: Diagnosis not present

## 2014-02-05 NOTE — Telephone Encounter (Signed)
New message     [facility] sent Dr Marlou Porch and Jeannene Patella a message regarding this patient's bp.  Pt is still there.  Please look at [facility] and call Verdis Frederickson.

## 2014-02-05 NOTE — Telephone Encounter (Signed)
Follow up    FYI Pt's bp is down to 126/56.  Maria sent him home

## 2014-02-05 NOTE — Telephone Encounter (Signed)
Information reviewed by Dr Marlou Porch - orders given for pt to restart Amlodipine 5 mg daily.   Pt is aware and states he already has this RX and will restart.  He will call back if any questions or concerns.

## 2014-02-05 NOTE — Telephone Encounter (Signed)
Located faxed information and gave to Dr Marlou Porch for review   Good morning Dr Marlou Porch,   Mr Bannister blood pressure was 194/86 this morning on the airdyne at cardiac rehab. We took Mr Check off the track and moved him to the track. Repeat blood pressure was 164/80 then 142/60. Vidit is taking 12.5 metoprolol bid and prinzide, zestoretic 20/25. I was from your office note in May prior to surgery Vrishank was also on Amlodipine 10 mg and toprol XL 25. Sherrick has gained 3.3 kg since starting cardiac rehab 01/06/14. Kester has wanted to gain weight. Just checking to see if you would mind reviewing his exercise flow sheets I faxed them over on Tuesday and make any adjustments as you deem necessary,   Thanks in advance I will fax today's reading over as well.    Verdis Frederickson

## 2014-02-07 ENCOUNTER — Encounter (HOSPITAL_COMMUNITY)
Admission: RE | Admit: 2014-02-07 | Discharge: 2014-02-07 | Disposition: A | Discharge status: Home / Self Care | Payer: Medicare Other | Source: Ambulatory Visit | Attending: Cardiology | Admitting: Cardiology

## 2014-02-07 DIAGNOSIS — I358 Other nonrheumatic aortic valve disorders: Secondary | ICD-10-CM | POA: Diagnosis not present

## 2014-02-07 DIAGNOSIS — E785 Hyperlipidemia, unspecified: Secondary | ICD-10-CM | POA: Diagnosis not present

## 2014-02-07 DIAGNOSIS — I2510 Atherosclerotic heart disease of native coronary artery without angina pectoris: Secondary | ICD-10-CM | POA: Insufficient documentation

## 2014-02-07 DIAGNOSIS — Z952 Presence of prosthetic heart valve: Secondary | ICD-10-CM | POA: Insufficient documentation

## 2014-02-07 DIAGNOSIS — Z5189 Encounter for other specified aftercare: Secondary | ICD-10-CM | POA: Insufficient documentation

## 2014-02-10 ENCOUNTER — Encounter (HOSPITAL_COMMUNITY)
Admission: RE | Admit: 2014-02-10 | Discharge: 2014-02-10 | Disposition: A | Discharge status: Home / Self Care | Payer: Medicare Other | Source: Ambulatory Visit | Attending: Cardiology | Admitting: Cardiology

## 2014-02-10 DIAGNOSIS — Z5189 Encounter for other specified aftercare: Secondary | ICD-10-CM | POA: Diagnosis not present

## 2014-02-12 ENCOUNTER — Encounter (HOSPITAL_COMMUNITY)
Admission: RE | Admit: 2014-02-12 | Discharge: 2014-02-12 | Disposition: A | Discharge status: Home / Self Care | Payer: Medicare Other | Source: Ambulatory Visit | Attending: Cardiology | Admitting: Cardiology

## 2014-02-12 DIAGNOSIS — Z5189 Encounter for other specified aftercare: Secondary | ICD-10-CM | POA: Diagnosis not present

## 2014-02-14 ENCOUNTER — Encounter (HOSPITAL_COMMUNITY)
Admission: RE | Admit: 2014-02-14 | Discharge: 2014-02-14 | Disposition: A | Discharge status: Home / Self Care | Payer: Medicare Other | Source: Ambulatory Visit | Attending: Cardiology | Admitting: Cardiology

## 2014-02-14 DIAGNOSIS — Z5189 Encounter for other specified aftercare: Secondary | ICD-10-CM | POA: Diagnosis not present

## 2014-02-17 ENCOUNTER — Encounter (HOSPITAL_COMMUNITY)
Admission: RE | Admit: 2014-02-17 | Discharge: 2014-02-17 | Disposition: A | Discharge status: Home / Self Care | Payer: Medicare Other | Source: Ambulatory Visit | Attending: Cardiology | Admitting: Cardiology

## 2014-02-17 DIAGNOSIS — Z5189 Encounter for other specified aftercare: Secondary | ICD-10-CM | POA: Diagnosis not present

## 2014-02-19 ENCOUNTER — Encounter (HOSPITAL_COMMUNITY)
Admission: RE | Admit: 2014-02-19 | Discharge: 2014-02-19 | Disposition: A | Discharge status: Home / Self Care | Payer: Medicare Other | Source: Ambulatory Visit | Attending: Cardiology | Admitting: Cardiology

## 2014-02-19 DIAGNOSIS — Z5189 Encounter for other specified aftercare: Secondary | ICD-10-CM | POA: Diagnosis not present

## 2014-02-21 ENCOUNTER — Encounter (HOSPITAL_COMMUNITY)
Admission: RE | Admit: 2014-02-21 | Discharge: 2014-02-21 | Disposition: A | Discharge status: Home / Self Care | Payer: Medicare Other | Source: Ambulatory Visit | Attending: Cardiology | Admitting: Cardiology

## 2014-02-21 DIAGNOSIS — Z5189 Encounter for other specified aftercare: Secondary | ICD-10-CM | POA: Diagnosis not present

## 2014-02-24 ENCOUNTER — Encounter (HOSPITAL_COMMUNITY)
Admission: RE | Admit: 2014-02-24 | Discharge: 2014-02-24 | Disposition: A | Discharge status: Home / Self Care | Payer: Medicare Other | Source: Ambulatory Visit | Attending: Cardiology | Admitting: Cardiology

## 2014-02-24 DIAGNOSIS — Z5189 Encounter for other specified aftercare: Secondary | ICD-10-CM | POA: Diagnosis not present

## 2014-02-26 ENCOUNTER — Encounter (HOSPITAL_COMMUNITY)
Admission: RE | Admit: 2014-02-26 | Discharge: 2014-02-26 | Disposition: A | Discharge status: Home / Self Care | Payer: Medicare Other | Source: Ambulatory Visit | Attending: Cardiology | Admitting: Cardiology

## 2014-02-26 DIAGNOSIS — Z5189 Encounter for other specified aftercare: Secondary | ICD-10-CM | POA: Diagnosis not present

## 2014-02-27 ENCOUNTER — Encounter: Payer: Self-pay | Admitting: Cardiology

## 2014-02-27 ENCOUNTER — Ambulatory Visit (INDEPENDENT_AMBULATORY_CARE_PROVIDER_SITE_OTHER): Payer: Medicare Other | Admitting: Cardiology

## 2014-02-27 VITALS — BP 142/74 | HR 66 | Ht 70.0 in | Wt 144.0 lb

## 2014-02-27 DIAGNOSIS — R7989 Other specified abnormal findings of blood chemistry: Secondary | ICD-10-CM

## 2014-02-27 DIAGNOSIS — Z954 Presence of other heart-valve replacement: Secondary | ICD-10-CM | POA: Diagnosis not present

## 2014-02-27 DIAGNOSIS — I2510 Atherosclerotic heart disease of native coronary artery without angina pectoris: Secondary | ICD-10-CM | POA: Diagnosis not present

## 2014-02-27 DIAGNOSIS — R945 Abnormal results of liver function studies: Secondary | ICD-10-CM

## 2014-02-27 DIAGNOSIS — I10 Essential (primary) hypertension: Secondary | ICD-10-CM

## 2014-02-27 DIAGNOSIS — I350 Nonrheumatic aortic (valve) stenosis: Secondary | ICD-10-CM | POA: Diagnosis not present

## 2014-02-27 DIAGNOSIS — I2583 Coronary atherosclerosis due to lipid rich plaque: Secondary | ICD-10-CM

## 2014-02-27 DIAGNOSIS — Z952 Presence of prosthetic heart valve: Secondary | ICD-10-CM

## 2014-02-27 NOTE — Progress Notes (Signed)
[facility]. [address], [address], [place]  [address] Phone: [phone] Fax:  [phone]  Date:  02/27/2014   ID:  [name], DOB [birth year], MRN [id number]  PCP:  Lujean Amel, MD   History of Present Illness: [name] is a 73 y.o. male with prior severe aortic stenosis having undergone aortic valve replacement, bioprosthetic valve on 07/22/15, uncomplicated course. Denies any chest pain, shortness of breath, fevers, chills. Cardiac rehabilitation has been taking place. Occasionally when he wakes up at night he will rub his chest wall and he wonders if this is psychological. No severe chest pain.  Other comorbidities include diabetes, hypertension, hyperlipidemia, hepatitis C as well as coronary artery disease (minor luminal irregularities on cardiac catheterization) and prior GI bleed.  Ejection fraction was normal prior to surgery. Mild bilateral carotid artery disease July of 2015 noted on Doppler.  ALT was 102 on 11/14/13.  Wt Readings from Last 3 Encounters:  02/27/14 144 lb (65.318 kg)  01/02/14 137 lb 2 oz (62.2 kg)  12/25/13 136 lb (61.689 kg)     Past Medical History  Diagnosis Date  . Essential hypertension, benign   . Diabetes mellitus without complication   . Hep C w/o coma, chronic   . Hypercholesterolemia   . ED (erectile dysfunction)   . History of DVT of lower extremity   . Acute epididymo-orchitis   . Aortic stenosis     s/p bioprosthetic AVR (Dr. Cyndia Bent) 11/2013  . Heart murmur   . History of blood transfusion   . Arthritis   . Hx of echocardiogram     Echo (8/15):  Severe LVH, EF 60-65%, no RWMA, Gr 3 DD, AVR ok (mean 21 mmHg), MAC, mild LAE, mild to mod RAE    Past Surgical History  Procedure Laterality Date  . Laminectomy  06/2005    Dr Joya Salm. multiple lumbar level laminectomies.   . Knee arthroscopy Left 05/2009    Dr Collier Salina  . 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   . Colonoscopy  01/2010   internal hemorrhoids.  Dr Deatra Ina  . Esophagogastroduodenoscopy N/A 04/24/2013    Procedure: ESOPHAGOGASTRODUODENOSCOPY (EGD);  Surgeon: Jerene Bears, MD;  Location: [facility] ENDOSCOPY;  Service: Endoscopy;  Laterality: N/A;  . Bunionectomy Bilateral 2008  . Inguinal hernia repair Right   . Tonsillectomy    . Cardiac catheterization  10/01/13  . Aortic valve replacement N/A 11/19/2013    Procedure: AORTIC VALVE REPLACEMENT (AVR);  Surgeon: Gaye Pollack, MD;  Location: [facility];  Service: Open Heart Surgery;  Laterality: N/A;  . Intraoperative transesophageal echocardiogram N/A 11/19/2013    Procedure: INTRAOPERATIVE TRANSESOPHAGEAL ECHOCARDIOGRAM;  Surgeon: Gaye Pollack, MD;  Location: [facility] OR;  Service: Open Heart Surgery;  Laterality: N/A;    Current Outpatient Prescriptions  Medication Sig Dispense Refill  . acetaminophen (TYLENOL) 500 MG tablet Take 500 mg by mouth every 6 (six) hours as needed for mild pain or moderate pain.      Marland Kitchen amLODipine (NORVASC) 5 MG tablet Take 5 mg by mouth daily.      Marland Kitchen aspirin EC 81 MG EC tablet Take 1 tablet (81 mg total) by mouth daily.      Marland Kitchen atorvastatin (LIPITOR) 20 MG tablet Take 20 mg by mouth at bedtime.      . ferrous gluconate (FERGON) 324 MG tablet Take 1 tablet (324 mg total) by mouth daily with breakfast.  30 tablet  3  . lisinopril-hydrochlorothiazide (PRINZIDE,ZESTORETIC) 20-25 MG per tablet Take 1 tablet by mouth daily.      . metFORMIN (GLUCOPHAGE) 500 MG tablet Take 500 mg by mouth 2 (two) times daily with a meal. Take 1000mg  in the morning and 1500 in the evening      . metoprolol tartrate (LOPRESSOR) 25 MG tablet Take 0.5 tablets (12.5 mg total) by mouth 2 (two) times daily.  30 each  2   No current facility-administered medications for this visit.    Allergies:    Allergies  Allergen Reactions  . Crestor [Rosuvastatin]     Discomfort, RESTLESS, CAN'T SLEEP Discomfort, RESTLESS, CAN'T SLEEP   . Vytorin [Ezetimibe-Simvastatin]      Restlessness at night      Social History:  The patient  reports that he quit smoking about 27 years ago. His smoking use included Cigarettes. He smoked 0.00 packs per day. He has never used smokeless tobacco. He reports that he does not drink alcohol or use illicit drugs.   Family History  Problem Relation Age of Onset  . Diabetes Father   . Stroke Father   . Diabetes Mother   . Stroke Mother   . Hypertension      family history  . Heart attack Neg Hx     ROS:  Please see the history of present illness.   Denies any fevers, chills, orthopnea, PND, syncope   All other systems reviewed and negative.   PHYSICAL EXAM: VS:  BP 142/74  Pulse 66  Ht 5\' 10"  (1.778 m)  Wt 144 lb (65.318 kg)  BMI 20.66 kg/m2 Well nourished, well developed, in no acute distress HEENT: normal, [place]/AT, EOMI Neck: no JVD, normal carotid upstroke, no bruit Cardiac:  normal S1, S2; RRR; no murmur Lungs:  clear to auscultation bilaterally, no wheezing, rhonchi or rales Abd: soft, nontender, no hepatomegaly, no bruits Ext: no edema, 2+ distal pulses Skin: warm and dry GU: deferred Neuro: no focal abnormalities noted, AAO x 3  EKG:  None today Labs: ALT 102 on 11/14/13, previously normal     ASSESSMENT AND PLAN:  1. History of severe aortic stenosis status post aortic valve replacement-doing very well. 3 months postop. Okay to lift greater than 10 pounds. Gradually increase exercise. In joint cardiac rehabilitation. Continue with protein. 2. Postoperative echocardiogram excellent. Normal valvular function. Okay to participate in sexual activity. 3. Elevated liver enzymes-ALT 102 surrounding surgery. Previously normal. Has history of hepatitis C. He'll be having blood work done next month by Dr. Dorthy Cooler his primary physician. I've asked him to go ahead and remind him to check LFTs at that time. 4. Hypertension, essential-mildly elevated today. Continue to monitor. Usually doing very well at cardiac  rehabilitation. 5. We will see him back in 6 months.  Signed, Candee Furbish, MD [facility]  02/27/2014 9:33 AM

## 2014-02-27 NOTE — Patient Instructions (Signed)
The current medical regimen is effective;  continue present plan and medications.  Please made sure your primary care doctor checks your liver enzymes with next blood work.  Follow up in 6 months with Dr. Marlou Porch.  You will receive a letter in the mail 2 months before you are due.  Please call us when you receive this letter to schedule your follow up appointment.

## 2014-02-28 ENCOUNTER — Encounter (HOSPITAL_COMMUNITY)
Admission: RE | Admit: 2014-02-28 | Discharge: 2014-02-28 | Disposition: A | Discharge status: Home / Self Care | Payer: Medicare Other | Source: Ambulatory Visit | Attending: Cardiology | Admitting: Cardiology

## 2014-02-28 DIAGNOSIS — Z5189 Encounter for other specified aftercare: Secondary | ICD-10-CM | POA: Diagnosis not present

## 2014-03-03 ENCOUNTER — Encounter (HOSPITAL_COMMUNITY)
Admission: RE | Admit: 2014-03-03 | Discharge: 2014-03-03 | Disposition: A | Discharge status: Home / Self Care | Payer: Medicare Other | Source: Ambulatory Visit | Attending: Cardiology | Admitting: Cardiology

## 2014-03-03 DIAGNOSIS — Z5189 Encounter for other specified aftercare: Secondary | ICD-10-CM | POA: Diagnosis not present

## 2014-03-05 ENCOUNTER — Encounter (HOSPITAL_COMMUNITY)
Admission: RE | Admit: 2014-03-05 | Discharge: 2014-03-05 | Disposition: A | Discharge status: Home / Self Care | Payer: Medicare Other | Source: Ambulatory Visit | Attending: Cardiology | Admitting: Cardiology

## 2014-03-05 ENCOUNTER — Ambulatory Visit: Payer: Medicare Other | Admitting: Cardiology

## 2014-03-05 DIAGNOSIS — Z5189 Encounter for other specified aftercare: Secondary | ICD-10-CM | POA: Diagnosis not present

## 2014-03-07 ENCOUNTER — Encounter (HOSPITAL_COMMUNITY)
Admission: RE | Admit: 2014-03-07 | Discharge: 2014-03-07 | Disposition: A | Discharge status: Home / Self Care | Payer: Medicare Other | Source: Ambulatory Visit | Attending: Cardiology | Admitting: Cardiology

## 2014-03-07 DIAGNOSIS — Z5189 Encounter for other specified aftercare: Secondary | ICD-10-CM | POA: Diagnosis not present

## 2014-03-10 ENCOUNTER — Encounter (HOSPITAL_COMMUNITY)
Admission: RE | Admit: 2014-03-10 | Discharge: 2014-03-10 | Disposition: A | Discharge status: Home / Self Care | Payer: Medicare Other | Source: Ambulatory Visit | Attending: Cardiology | Admitting: Cardiology

## 2014-03-10 DIAGNOSIS — Z5189 Encounter for other specified aftercare: Secondary | ICD-10-CM | POA: Insufficient documentation

## 2014-03-10 DIAGNOSIS — E785 Hyperlipidemia, unspecified: Secondary | ICD-10-CM | POA: Diagnosis not present

## 2014-03-10 DIAGNOSIS — Z952 Presence of prosthetic heart valve: Secondary | ICD-10-CM | POA: Insufficient documentation

## 2014-03-10 DIAGNOSIS — I2510 Atherosclerotic heart disease of native coronary artery without angina pectoris: Secondary | ICD-10-CM | POA: Insufficient documentation

## 2014-03-12 ENCOUNTER — Encounter (HOSPITAL_COMMUNITY): Payer: Medicare Other

## 2014-03-12 DIAGNOSIS — Z5189 Encounter for other specified aftercare: Secondary | ICD-10-CM | POA: Diagnosis not present

## 2014-03-14 ENCOUNTER — Encounter (HOSPITAL_COMMUNITY)
Admission: RE | Admit: 2014-03-14 | Discharge: 2014-03-14 | Disposition: A | Discharge status: Home / Self Care | Payer: Medicare Other | Source: Ambulatory Visit | Attending: Cardiology | Admitting: Cardiology

## 2014-03-14 DIAGNOSIS — Z5189 Encounter for other specified aftercare: Secondary | ICD-10-CM | POA: Diagnosis not present

## 2014-03-17 ENCOUNTER — Encounter (HOSPITAL_COMMUNITY)
Admission: RE | Admit: 2014-03-17 | Discharge: 2014-03-17 | Disposition: A | Discharge status: Home / Self Care | Payer: Medicare Other | Source: Ambulatory Visit | Attending: Cardiology | Admitting: Cardiology

## 2014-03-17 DIAGNOSIS — Z5189 Encounter for other specified aftercare: Secondary | ICD-10-CM | POA: Diagnosis not present

## 2014-03-19 ENCOUNTER — Encounter (HOSPITAL_COMMUNITY)
Admission: RE | Admit: 2014-03-19 | Discharge: 2014-03-19 | Disposition: A | Discharge status: Home / Self Care | Payer: Medicare Other | Source: Ambulatory Visit | Attending: Cardiology | Admitting: Cardiology

## 2014-03-19 DIAGNOSIS — Z5189 Encounter for other specified aftercare: Secondary | ICD-10-CM | POA: Diagnosis not present

## 2014-03-21 ENCOUNTER — Encounter (HOSPITAL_COMMUNITY)
Admission: RE | Admit: 2014-03-21 | Discharge: 2014-03-21 | Disposition: A | Discharge status: Home / Self Care | Payer: Medicare Other | Source: Ambulatory Visit | Attending: Cardiology | Admitting: Cardiology

## 2014-03-21 DIAGNOSIS — Z5189 Encounter for other specified aftercare: Secondary | ICD-10-CM | POA: Diagnosis not present

## 2014-03-24 ENCOUNTER — Encounter (HOSPITAL_COMMUNITY)
Admission: RE | Admit: 2014-03-24 | Discharge: 2014-03-24 | Disposition: A | Discharge status: Home / Self Care | Payer: Medicare Other | Source: Ambulatory Visit | Attending: Cardiology | Admitting: Cardiology

## 2014-03-24 DIAGNOSIS — Z5189 Encounter for other specified aftercare: Secondary | ICD-10-CM | POA: Diagnosis not present

## 2014-03-26 ENCOUNTER — Encounter (HOSPITAL_COMMUNITY)
Admission: RE | Admit: 2014-03-26 | Discharge: 2014-03-26 | Disposition: A | Discharge status: Home / Self Care | Payer: Medicare Other | Source: Ambulatory Visit | Attending: Cardiology | Admitting: Cardiology

## 2014-03-26 DIAGNOSIS — Z5189 Encounter for other specified aftercare: Secondary | ICD-10-CM | POA: Diagnosis not present

## 2014-03-27 DIAGNOSIS — I10 Essential (primary) hypertension: Secondary | ICD-10-CM | POA: Diagnosis not present

## 2014-03-27 DIAGNOSIS — D649 Anemia, unspecified: Secondary | ICD-10-CM | POA: Diagnosis not present

## 2014-03-27 DIAGNOSIS — E119 Type 2 diabetes mellitus without complications: Secondary | ICD-10-CM | POA: Diagnosis not present

## 2014-03-27 DIAGNOSIS — Z79899 Other long term (current) drug therapy: Secondary | ICD-10-CM | POA: Diagnosis not present

## 2014-03-27 DIAGNOSIS — Z952 Presence of prosthetic heart valve: Secondary | ICD-10-CM | POA: Diagnosis not present

## 2014-03-27 DIAGNOSIS — E785 Hyperlipidemia, unspecified: Secondary | ICD-10-CM | POA: Diagnosis not present

## 2014-03-27 DIAGNOSIS — N5201 Erectile dysfunction due to arterial insufficiency: Secondary | ICD-10-CM | POA: Diagnosis not present

## 2014-03-27 DIAGNOSIS — B1920 Unspecified viral hepatitis C without hepatic coma: Secondary | ICD-10-CM | POA: Diagnosis not present

## 2014-03-28 ENCOUNTER — Encounter (HOSPITAL_COMMUNITY)
Admission: RE | Admit: 2014-03-28 | Discharge: 2014-03-28 | Disposition: A | Discharge status: Home / Self Care | Payer: Medicare Other | Source: Ambulatory Visit | Attending: Cardiology | Admitting: Cardiology

## 2014-03-28 DIAGNOSIS — Z5189 Encounter for other specified aftercare: Secondary | ICD-10-CM | POA: Diagnosis not present

## 2014-03-31 ENCOUNTER — Encounter (HOSPITAL_COMMUNITY)
Admission: RE | Admit: 2014-03-31 | Discharge: 2014-03-31 | Disposition: A | Discharge status: Home / Self Care | Payer: Medicare Other | Source: Ambulatory Visit | Attending: Cardiology | Admitting: Cardiology

## 2014-03-31 DIAGNOSIS — Z5189 Encounter for other specified aftercare: Secondary | ICD-10-CM | POA: Diagnosis not present

## 2014-04-02 ENCOUNTER — Encounter (HOSPITAL_COMMUNITY)
Admission: RE | Admit: 2014-04-02 | Discharge: 2014-04-02 | Disposition: A | Discharge status: Home / Self Care | Payer: Medicare Other | Source: Ambulatory Visit | Attending: Cardiology | Admitting: Cardiology

## 2014-04-02 DIAGNOSIS — Z5189 Encounter for other specified aftercare: Secondary | ICD-10-CM | POA: Diagnosis not present

## 2014-04-07 ENCOUNTER — Other Ambulatory Visit: Payer: Self-pay | Admitting: Cardiology

## 2014-04-07 ENCOUNTER — Encounter (HOSPITAL_COMMUNITY)
Admission: RE | Admit: 2014-04-07 | Discharge: 2014-04-07 | Disposition: A | Discharge status: Home / Self Care | Payer: Medicare Other | Source: Ambulatory Visit | Attending: Cardiology | Admitting: Cardiology

## 2014-04-07 DIAGNOSIS — Z5189 Encounter for other specified aftercare: Secondary | ICD-10-CM | POA: Diagnosis not present

## 2014-04-07 NOTE — Progress Notes (Signed)
[name] graduates today from cardiac rehab today. PHQ score =0. [place] plans to continue exercise at the [facility]. [name] says his goals have been met.

## 2014-04-09 ENCOUNTER — Encounter (HOSPITAL_COMMUNITY): Payer: Medicare Other

## 2014-04-11 ENCOUNTER — Encounter (HOSPITAL_COMMUNITY): Payer: Medicare Other

## 2014-04-17 ENCOUNTER — Encounter (HOSPITAL_COMMUNITY): Payer: Self-pay | Admitting: Cardiology

## 2014-07-04 DIAGNOSIS — I10 Essential (primary) hypertension: Secondary | ICD-10-CM | POA: Diagnosis not present

## 2014-07-04 DIAGNOSIS — E785 Hyperlipidemia, unspecified: Secondary | ICD-10-CM | POA: Diagnosis not present

## 2014-07-04 DIAGNOSIS — E119 Type 2 diabetes mellitus without complications: Secondary | ICD-10-CM | POA: Diagnosis not present

## 2014-07-09 DIAGNOSIS — K7460 Unspecified cirrhosis of liver: Secondary | ICD-10-CM | POA: Diagnosis not present

## 2014-07-09 DIAGNOSIS — E785 Hyperlipidemia, unspecified: Secondary | ICD-10-CM | POA: Diagnosis not present

## 2014-07-09 DIAGNOSIS — E119 Type 2 diabetes mellitus without complications: Secondary | ICD-10-CM | POA: Diagnosis not present

## 2014-07-09 DIAGNOSIS — K7469 Other cirrhosis of liver: Secondary | ICD-10-CM | POA: Diagnosis not present

## 2014-07-09 DIAGNOSIS — Z87891 Personal history of nicotine dependence: Secondary | ICD-10-CM | POA: Diagnosis not present

## 2014-07-09 DIAGNOSIS — I10 Essential (primary) hypertension: Secondary | ICD-10-CM | POA: Diagnosis not present

## 2014-07-09 DIAGNOSIS — Z952 Presence of prosthetic heart valve: Secondary | ICD-10-CM | POA: Diagnosis not present

## 2014-07-09 DIAGNOSIS — B182 Chronic viral hepatitis C: Secondary | ICD-10-CM | POA: Diagnosis not present

## 2014-07-11 DIAGNOSIS — I10 Essential (primary) hypertension: Secondary | ICD-10-CM | POA: Diagnosis not present

## 2014-07-11 DIAGNOSIS — J029 Acute pharyngitis, unspecified: Secondary | ICD-10-CM | POA: Diagnosis not present

## 2014-07-11 DIAGNOSIS — E119 Type 2 diabetes mellitus without complications: Secondary | ICD-10-CM | POA: Diagnosis not present

## 2014-07-18 DIAGNOSIS — N529 Male erectile dysfunction, unspecified: Secondary | ICD-10-CM | POA: Diagnosis not present

## 2014-07-18 DIAGNOSIS — E119 Type 2 diabetes mellitus without complications: Secondary | ICD-10-CM | POA: Diagnosis not present

## 2014-07-18 DIAGNOSIS — I10 Essential (primary) hypertension: Secondary | ICD-10-CM | POA: Diagnosis not present

## 2014-07-25 DIAGNOSIS — N281 Cyst of kidney, acquired: Secondary | ICD-10-CM | POA: Diagnosis not present

## 2014-07-25 DIAGNOSIS — N289 Disorder of kidney and ureter, unspecified: Secondary | ICD-10-CM | POA: Diagnosis not present

## 2014-07-25 DIAGNOSIS — B182 Chronic viral hepatitis C: Secondary | ICD-10-CM | POA: Diagnosis not present

## 2014-07-25 DIAGNOSIS — K7469 Other cirrhosis of liver: Secondary | ICD-10-CM | POA: Diagnosis not present

## 2014-07-30 ENCOUNTER — Telehealth: Payer: Self-pay | Admitting: Gastroenterology

## 2014-07-30 NOTE — Telephone Encounter (Signed)
Left message for the nurse to call back. Direct line given to her. 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

## 2014-07-31 NOTE — Telephone Encounter (Signed)
Nurse calls back. She thought Dr Deatra Ina was the PCP. She will contact PCP.

## 2014-08-19 DIAGNOSIS — I10 Essential (primary) hypertension: Secondary | ICD-10-CM | POA: Diagnosis not present

## 2014-08-19 DIAGNOSIS — B182 Chronic viral hepatitis C: Secondary | ICD-10-CM | POA: Diagnosis not present

## 2014-08-19 DIAGNOSIS — Z87891 Personal history of nicotine dependence: Secondary | ICD-10-CM | POA: Diagnosis not present

## 2014-08-19 DIAGNOSIS — Z952 Presence of prosthetic heart valve: Secondary | ICD-10-CM | POA: Diagnosis not present

## 2014-08-19 DIAGNOSIS — E785 Hyperlipidemia, unspecified: Secondary | ICD-10-CM | POA: Diagnosis not present

## 2014-08-19 DIAGNOSIS — N289 Disorder of kidney and ureter, unspecified: Secondary | ICD-10-CM | POA: Diagnosis not present

## 2014-08-19 DIAGNOSIS — Z79899 Other long term (current) drug therapy: Secondary | ICD-10-CM | POA: Diagnosis not present

## 2014-08-19 DIAGNOSIS — E119 Type 2 diabetes mellitus without complications: Secondary | ICD-10-CM | POA: Diagnosis not present

## 2014-08-26 ENCOUNTER — Ambulatory Visit (INDEPENDENT_AMBULATORY_CARE_PROVIDER_SITE_OTHER): Payer: Medicare Other | Admitting: Cardiology

## 2014-08-26 ENCOUNTER — Encounter: Payer: Self-pay | Admitting: Cardiology

## 2014-08-26 VITALS — BP 116/70 | HR 48 | Ht 71.0 in | Wt 146.1 lb

## 2014-08-26 DIAGNOSIS — R7989 Other specified abnormal findings of blood chemistry: Secondary | ICD-10-CM

## 2014-08-26 DIAGNOSIS — R945 Abnormal results of liver function studies: Secondary | ICD-10-CM

## 2014-08-26 DIAGNOSIS — I2510 Atherosclerotic heart disease of native coronary artery without angina pectoris: Secondary | ICD-10-CM | POA: Diagnosis not present

## 2014-08-26 DIAGNOSIS — Z954 Presence of other heart-valve replacement: Secondary | ICD-10-CM | POA: Diagnosis not present

## 2014-08-26 DIAGNOSIS — Z952 Presence of prosthetic heart valve: Secondary | ICD-10-CM

## 2014-08-26 DIAGNOSIS — I10 Essential (primary) hypertension: Secondary | ICD-10-CM | POA: Diagnosis not present

## 2014-08-26 DIAGNOSIS — I2583 Coronary atherosclerosis due to lipid rich plaque: Principal | ICD-10-CM

## 2014-08-26 NOTE — Progress Notes (Signed)
[facility]. [address] [place], [place]  [address] Phone: [phone] Fax:  [phone]  Date:  08/26/2014   ID:  [name], DOB [birth year], MRN [id number]  PCP:  Lujean Amel, MD   History of Present Illness: [name] is a 74 y.o. male with prior severe aortic stenosis having undergone aortic valve replacement, bioprosthetic valve on 3/71/06, uncomplicated course. Denies any chest pain, shortness of breath, fevers, chills. Cardiac rehabilitation has been taking place. Occasionally when he wakes up at night he will rub his chest wall and he wonders if this is psychological. No severe chest pain.  Other comorbidities include diabetes, hypertension, hyperlipidemia, hepatitis C as well as coronary artery disease (minor luminal irregularities on cardiac catheterization) and prior GI bleed.  Ejection fraction was normal prior to surgery. Mild bilateral carotid artery disease July of 2015 noted on Doppler.  ALT was 102 on 11/14/13.  08/26/14-overall doing very well, no shortness of breath, no chest pain, no syncope. Aware of dental antibiotics.  Wt Readings from Last 3 Encounters:  08/26/14 146 lb 1.9 oz (66.28 kg)  02/27/14 144 lb (65.318 kg)  01/02/14 137 lb 2 oz (62.2 kg)     Past Medical History  Diagnosis Date  . Essential hypertension, benign   . Diabetes mellitus without complication   . Hep C w/o coma, chronic   . Hypercholesterolemia   . ED (erectile dysfunction)   . History of DVT of lower extremity   . Acute epididymo-orchitis   . Aortic stenosis     s/p bioprosthetic AVR (Dr. Cyndia Bent) 11/2013  . Heart murmur   . History of blood transfusion   . Arthritis   . Hx of echocardiogram     Echo (8/15):  Severe LVH, EF 60-65%, no RWMA, Gr 3 DD, AVR ok (mean 21 mmHg), MAC, mild LAE, mild to mod RAE    Past Surgical History  Procedure Laterality Date  . Laminectomy  06/2005    Dr Joya Salm. multiple lumbar level laminectomies.   . Knee arthroscopy Left  05/2009    Dr Collier Salina  . 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   . Colonoscopy  01/2010    internal hemorrhoids.  Dr Deatra Ina  . Esophagogastroduodenoscopy N/A 04/24/2013    Procedure: ESOPHAGOGASTRODUODENOSCOPY (EGD);  Surgeon: Jerene Bears, MD;  Location: [facility] ENDOSCOPY;  Service: Endoscopy;  Laterality: N/A;  . Bunionectomy Bilateral 2008  . Inguinal hernia repair Right   . Tonsillectomy    . Cardiac catheterization  10/01/13  . Aortic valve replacement N/A 11/19/2013    Procedure: AORTIC VALVE REPLACEMENT (AVR);  Surgeon: Gaye Pollack, MD;  Location: [facility];  Service: Open Heart Surgery;  Laterality: N/A;  . Intraoperative transesophageal echocardiogram N/A 11/19/2013    Procedure: INTRAOPERATIVE TRANSESOPHAGEAL ECHOCARDIOGRAM;  Surgeon: Gaye Pollack, MD;  Location: [facility] OR;  Service: Open Heart Surgery;  Laterality: N/A;  . Left and right heart catheterization with coronary angiogram N/A 10/01/2013    Procedure: LEFT AND RIGHT HEART CATHETERIZATION WITH CORONARY ANGIOGRAM;  Surgeon: Candee Furbish, MD;  Location: [facility] CATH LAB;  Service: Cardiovascular;  Laterality: N/A;    Current Outpatient Prescriptions  Medication Sig Dispense Refill  . acetaminophen (TYLENOL) 500 MG tablet Take 500 mg by mouth every 6 (six) hours as needed for mild pain or moderate pain.    Marland Kitchen amLODipine (NORVASC) 10 MG tablet Take 10 mg by mouth daily.  0  . aspirin EC  81 MG EC tablet Take 1 tablet (81 mg total) by mouth daily.    Marland Kitchen atorvastatin (LIPITOR) 20 MG tablet Take 20 mg by mouth at bedtime.    Marland Kitchen CIALIS 20 MG tablet Take 1 tablet by mouth as needed. For erectile dysfunction  0  . ferrous gluconate (FERGON) 324 MG tablet Take 1 tablet (324 mg total) by mouth daily with breakfast. 30 tablet 3  . HARVONI 90-400 MG TABS Take 1 tablet by mouth daily.    Marland Kitchen lisinopril-hydrochlorothiazide (PRINZIDE,ZESTORETIC) 20-25 MG per tablet Take 1 tablet by mouth daily.    . metFORMIN (GLUCOPHAGE) 500 MG  tablet Take 500 mg by mouth 2 (two) times daily with a meal. Take '500mg'$  by mouth twice daily    . metoprolol tartrate (LOPRESSOR) 25 MG tablet take 1/2 tablet by mouth twice a day 30 tablet 5   No current facility-administered medications for this visit.    Allergies:    Allergies  Allergen Reactions  . Crestor [Rosuvastatin]     Discomfort, RESTLESS, CAN'T SLEEP Discomfort, RESTLESS, CAN'T SLEEP   . Vytorin [Ezetimibe-Simvastatin]     Restlessness at night      Social History:  The patient  reports that he quit smoking about 27 years ago. His smoking use included Cigarettes. He has never used smokeless tobacco. He reports that he does not drink alcohol or use illicit drugs.   Family History  Problem Relation Age of Onset  . Diabetes Father   . Stroke Father   . Diabetes Mother   . Stroke Mother   . Hypertension      family history  . Heart attack Neg Hx     ROS:  Please see the history of present illness.   Denies any fevers, chills, orthopnea, PND, syncope   All other systems reviewed and negative.   PHYSICAL EXAM: VS:  BP 116/70 mmHg  Pulse 48  Ht '5\' 11"'$  (1.803 m)  Wt 146 lb 1.9 oz (66.28 kg)  BMI 20.39 kg/m2 Well nourished, well developed, in no acute distress HEENT: normal, [place]/AT, EOMI Neck: no JVD, normal carotid upstroke, no bruit Cardiac:  normal S1, S2; RRR; no murmur Lungs:  clear to auscultation bilaterally, no wheezing, rhonchi or rales Abd: soft, nontender, no hepatomegaly, no bruits Ext: no edema, 2+ distal pulses Skin: warm and dry GU: deferred Neuro: no focal abnormalities noted, AAO x 3  EKG:  None today Labs: ALT 102 on 11/14/13, previously normal   Has had subsequent blood work at Dr. Dorthy Cooler  ASSESSMENT AND PLAN:  1. History of severe aortic stenosis status post aortic valve replacement, bioprosthetic-doing very well.  Gradually increase exercise. Cardiac rehabilitation. Continue with protein. 2. Postoperative echocardiogram excellent. Normal  valvular function. Okay to participate in sexual activity. 3. Elevated liver enzymes-ALT 102 surrounding surgery. Previously normal. Has history of hepatitis C. Blood work done next month by Dr. Dorthy Cooler his primary physician.  4. Hypertension-doing well, well controlled. No changes made  5. We will see him back in 12 months.  Signed, Candee Furbish, MD [facility]  08/26/2014 9:16 AM

## 2014-08-26 NOTE — Patient Instructions (Signed)
Medication Instructions:  Your physician recommends that you continue on your current medications as directed. Please refer to the Current Medication list given to you today.   Labwork: None  Testing/Procedures: None  Follow-Up: Follow up in 1 year with Dr. Marlou Porch.  You will receive a letter in the mail 2 months before you are due.  Please call us when you receive this letter to schedule your follow up appointment.  Thank you for choosing [facility]!!

## 2014-10-02 DIAGNOSIS — I10 Essential (primary) hypertension: Secondary | ICD-10-CM | POA: Diagnosis not present

## 2014-10-02 DIAGNOSIS — B1920 Unspecified viral hepatitis C without hepatic coma: Secondary | ICD-10-CM | POA: Diagnosis not present

## 2014-10-02 DIAGNOSIS — D649 Anemia, unspecified: Secondary | ICD-10-CM | POA: Diagnosis not present

## 2014-10-02 DIAGNOSIS — E119 Type 2 diabetes mellitus without complications: Secondary | ICD-10-CM | POA: Diagnosis not present

## 2014-10-02 DIAGNOSIS — N5201 Erectile dysfunction due to arterial insufficiency: Secondary | ICD-10-CM | POA: Diagnosis not present

## 2014-10-02 DIAGNOSIS — Z0001 Encounter for general adult medical examination with abnormal findings: Secondary | ICD-10-CM | POA: Diagnosis not present

## 2014-10-02 DIAGNOSIS — Z79899 Other long term (current) drug therapy: Secondary | ICD-10-CM | POA: Diagnosis not present

## 2014-10-02 DIAGNOSIS — E1165 Type 2 diabetes mellitus with hyperglycemia: Secondary | ICD-10-CM | POA: Diagnosis not present

## 2014-10-02 DIAGNOSIS — L84 Corns and callosities: Secondary | ICD-10-CM | POA: Diagnosis not present

## 2014-10-02 DIAGNOSIS — E785 Hyperlipidemia, unspecified: Secondary | ICD-10-CM | POA: Diagnosis not present

## 2014-10-17 ENCOUNTER — Encounter: Payer: Self-pay | Admitting: Podiatry

## 2014-10-17 ENCOUNTER — Ambulatory Visit (INDEPENDENT_AMBULATORY_CARE_PROVIDER_SITE_OTHER): Payer: Medicare Other | Admitting: Podiatry

## 2014-10-17 VITALS — BP 148/83 | HR 52 | Resp 16

## 2014-10-17 DIAGNOSIS — E1151 Type 2 diabetes mellitus with diabetic peripheral angiopathy without gangrene: Secondary | ICD-10-CM | POA: Diagnosis not present

## 2014-10-17 DIAGNOSIS — Q828 Other specified congenital malformations of skin: Secondary | ICD-10-CM | POA: Diagnosis not present

## 2014-10-20 NOTE — Progress Notes (Signed)
Subjective:     Patient ID: [name], male   DOB: 16-Jul-1940, 74 y.o.   MRN: [id number]  HPI patient presents stating I have painful calluses on the bottom of both my feet   Review of Systems     Objective:   Physical Exam Her vascular status intact no health history changes noted with keratotic lesions plantar aspect bilateral that are painful when pressed    Assessment:     Structural changes with keratotic lesion formation secondary to pressure    Plan:     Debride lesions on both feet with no iatrogenic bleeding noted

## 2014-11-20 DIAGNOSIS — K7469 Other cirrhosis of liver: Secondary | ICD-10-CM | POA: Diagnosis not present

## 2014-11-20 DIAGNOSIS — B182 Chronic viral hepatitis C: Secondary | ICD-10-CM | POA: Diagnosis not present

## 2014-11-20 DIAGNOSIS — E785 Hyperlipidemia, unspecified: Secondary | ICD-10-CM | POA: Diagnosis not present

## 2014-11-20 DIAGNOSIS — I10 Essential (primary) hypertension: Secondary | ICD-10-CM | POA: Diagnosis not present

## 2014-11-20 DIAGNOSIS — Z79899 Other long term (current) drug therapy: Secondary | ICD-10-CM | POA: Diagnosis not present

## 2014-11-20 DIAGNOSIS — E119 Type 2 diabetes mellitus without complications: Secondary | ICD-10-CM | POA: Diagnosis not present

## 2014-11-20 DIAGNOSIS — Z952 Presence of prosthetic heart valve: Secondary | ICD-10-CM | POA: Diagnosis not present

## 2014-12-15 DIAGNOSIS — E1169 Type 2 diabetes mellitus with other specified complication: Secondary | ICD-10-CM | POA: Diagnosis not present

## 2014-12-15 DIAGNOSIS — N521 Erectile dysfunction due to diseases classified elsewhere: Secondary | ICD-10-CM | POA: Diagnosis not present

## 2014-12-24 DIAGNOSIS — E119 Type 2 diabetes mellitus without complications: Secondary | ICD-10-CM | POA: Diagnosis not present

## 2015-01-14 DIAGNOSIS — Z23 Encounter for immunization: Secondary | ICD-10-CM | POA: Diagnosis not present

## 2015-01-14 DIAGNOSIS — L723 Sebaceous cyst: Secondary | ICD-10-CM | POA: Diagnosis not present

## 2015-01-14 DIAGNOSIS — H6123 Impacted cerumen, bilateral: Secondary | ICD-10-CM | POA: Diagnosis not present

## 2015-01-15 DIAGNOSIS — N281 Cyst of kidney, acquired: Secondary | ICD-10-CM | POA: Diagnosis not present

## 2015-01-15 DIAGNOSIS — Z79899 Other long term (current) drug therapy: Secondary | ICD-10-CM | POA: Diagnosis not present

## 2015-01-15 DIAGNOSIS — Z7982 Long term (current) use of aspirin: Secondary | ICD-10-CM | POA: Diagnosis not present

## 2015-01-15 DIAGNOSIS — I10 Essential (primary) hypertension: Secondary | ICD-10-CM | POA: Diagnosis not present

## 2015-01-15 DIAGNOSIS — K828 Other specified diseases of gallbladder: Secondary | ICD-10-CM | POA: Diagnosis not present

## 2015-01-15 DIAGNOSIS — E119 Type 2 diabetes mellitus without complications: Secondary | ICD-10-CM | POA: Diagnosis not present

## 2015-01-15 DIAGNOSIS — N289 Disorder of kidney and ureter, unspecified: Secondary | ICD-10-CM | POA: Diagnosis not present

## 2015-01-15 DIAGNOSIS — Z952 Presence of prosthetic heart valve: Secondary | ICD-10-CM | POA: Diagnosis not present

## 2015-01-15 DIAGNOSIS — K769 Liver disease, unspecified: Secondary | ICD-10-CM | POA: Diagnosis not present

## 2015-01-15 DIAGNOSIS — Z87891 Personal history of nicotine dependence: Secondary | ICD-10-CM | POA: Diagnosis not present

## 2015-01-15 DIAGNOSIS — E785 Hyperlipidemia, unspecified: Secondary | ICD-10-CM | POA: Diagnosis not present

## 2015-01-15 DIAGNOSIS — B182 Chronic viral hepatitis C: Secondary | ICD-10-CM | POA: Diagnosis not present

## 2015-01-30 DIAGNOSIS — E1169 Type 2 diabetes mellitus with other specified complication: Secondary | ICD-10-CM | POA: Diagnosis not present

## 2015-01-30 DIAGNOSIS — N521 Erectile dysfunction due to diseases classified elsewhere: Secondary | ICD-10-CM | POA: Diagnosis not present

## 2015-02-17 DIAGNOSIS — B182 Chronic viral hepatitis C: Secondary | ICD-10-CM | POA: Diagnosis not present

## 2015-03-26 DIAGNOSIS — K7460 Unspecified cirrhosis of liver: Secondary | ICD-10-CM | POA: Diagnosis not present

## 2015-03-26 DIAGNOSIS — B182 Chronic viral hepatitis C: Secondary | ICD-10-CM | POA: Diagnosis not present

## 2015-04-15 DIAGNOSIS — B182 Chronic viral hepatitis C: Secondary | ICD-10-CM | POA: Diagnosis not present

## 2015-04-15 DIAGNOSIS — N281 Cyst of kidney, acquired: Secondary | ICD-10-CM | POA: Diagnosis not present

## 2015-04-15 DIAGNOSIS — B1920 Unspecified viral hepatitis C without hepatic coma: Secondary | ICD-10-CM | POA: Diagnosis not present

## 2015-04-15 DIAGNOSIS — K7460 Unspecified cirrhosis of liver: Secondary | ICD-10-CM | POA: Diagnosis not present

## 2015-09-29 DIAGNOSIS — R932 Abnormal findings on diagnostic imaging of liver and biliary tract: Secondary | ICD-10-CM | POA: Diagnosis not present

## 2015-09-29 DIAGNOSIS — I350 Nonrheumatic aortic (valve) stenosis: Secondary | ICD-10-CM | POA: Diagnosis not present

## 2015-09-29 DIAGNOSIS — E119 Type 2 diabetes mellitus without complications: Secondary | ICD-10-CM | POA: Diagnosis not present

## 2015-09-29 DIAGNOSIS — B182 Chronic viral hepatitis C: Secondary | ICD-10-CM | POA: Diagnosis not present

## 2015-09-29 DIAGNOSIS — K744 Secondary biliary cirrhosis: Secondary | ICD-10-CM | POA: Diagnosis not present

## 2015-09-29 DIAGNOSIS — Z79899 Other long term (current) drug therapy: Secondary | ICD-10-CM | POA: Diagnosis not present

## 2015-09-29 DIAGNOSIS — I10 Essential (primary) hypertension: Secondary | ICD-10-CM | POA: Diagnosis not present

## 2015-10-06 DIAGNOSIS — R42 Dizziness and giddiness: Secondary | ICD-10-CM | POA: Diagnosis not present

## 2015-10-06 DIAGNOSIS — Z952 Presence of prosthetic heart valve: Secondary | ICD-10-CM | POA: Diagnosis not present

## 2015-10-06 DIAGNOSIS — E119 Type 2 diabetes mellitus without complications: Secondary | ICD-10-CM | POA: Diagnosis not present

## 2015-10-06 DIAGNOSIS — Z79899 Other long term (current) drug therapy: Secondary | ICD-10-CM | POA: Diagnosis not present

## 2015-10-06 DIAGNOSIS — Z0001 Encounter for general adult medical examination with abnormal findings: Secondary | ICD-10-CM | POA: Diagnosis not present

## 2015-10-06 DIAGNOSIS — I10 Essential (primary) hypertension: Secondary | ICD-10-CM | POA: Diagnosis not present

## 2015-10-06 DIAGNOSIS — N5201 Erectile dysfunction due to arterial insufficiency: Secondary | ICD-10-CM | POA: Diagnosis not present

## 2015-10-06 DIAGNOSIS — E7800 Pure hypercholesterolemia, unspecified: Secondary | ICD-10-CM | POA: Diagnosis not present

## 2015-10-06 DIAGNOSIS — Z7984 Long term (current) use of oral hypoglycemic drugs: Secondary | ICD-10-CM | POA: Diagnosis not present

## 2015-10-20 DIAGNOSIS — K744 Secondary biliary cirrhosis: Secondary | ICD-10-CM | POA: Diagnosis not present

## 2015-10-20 DIAGNOSIS — N281 Cyst of kidney, acquired: Secondary | ICD-10-CM | POA: Diagnosis not present

## 2015-10-20 DIAGNOSIS — K7460 Unspecified cirrhosis of liver: Secondary | ICD-10-CM | POA: Diagnosis not present

## 2015-11-02 ENCOUNTER — Encounter: Payer: Self-pay | Admitting: Podiatry

## 2015-11-02 ENCOUNTER — Ambulatory Visit (INDEPENDENT_AMBULATORY_CARE_PROVIDER_SITE_OTHER): Payer: Medicare Other | Admitting: Podiatry

## 2015-11-02 DIAGNOSIS — E1151 Type 2 diabetes mellitus with diabetic peripheral angiopathy without gangrene: Secondary | ICD-10-CM

## 2015-11-02 DIAGNOSIS — Q828 Other specified congenital malformations of skin: Secondary | ICD-10-CM

## 2015-11-03 NOTE — Progress Notes (Signed)
Subjective:     Patient ID: [name], male   DOB: April 26, 1941, 75 y.o.   MRN: [id number]  HPI patient presents with calluses on the bottom of both feet that are quite painful   Review of Systems     Objective:   Physical Exam Neurovascular unchanged with thick keratotic lesions plantar aspect both feet with history of diabetes with diminishment of vascular and circulatory status and also slight diminishment of sharp [place] vibratory    Assessment:     Chronic lesion formation with at risk diabetic    Plan:     Debridement accomplished with no iatrogenic bleeding noted

## 2016-01-29 DIAGNOSIS — Z23 Encounter for immunization: Secondary | ICD-10-CM | POA: Diagnosis not present

## 2016-02-08 DIAGNOSIS — E119 Type 2 diabetes mellitus without complications: Secondary | ICD-10-CM | POA: Diagnosis not present

## 2016-02-25 IMAGING — CR DG CHEST 2V
2 series · 2 of 2 positions shown · non-contrast
Comparison: November 22, 2013

CLINICAL DATA: Aortic valve disease

EXAM:
CHEST  2 VIEW

[w chest pa]
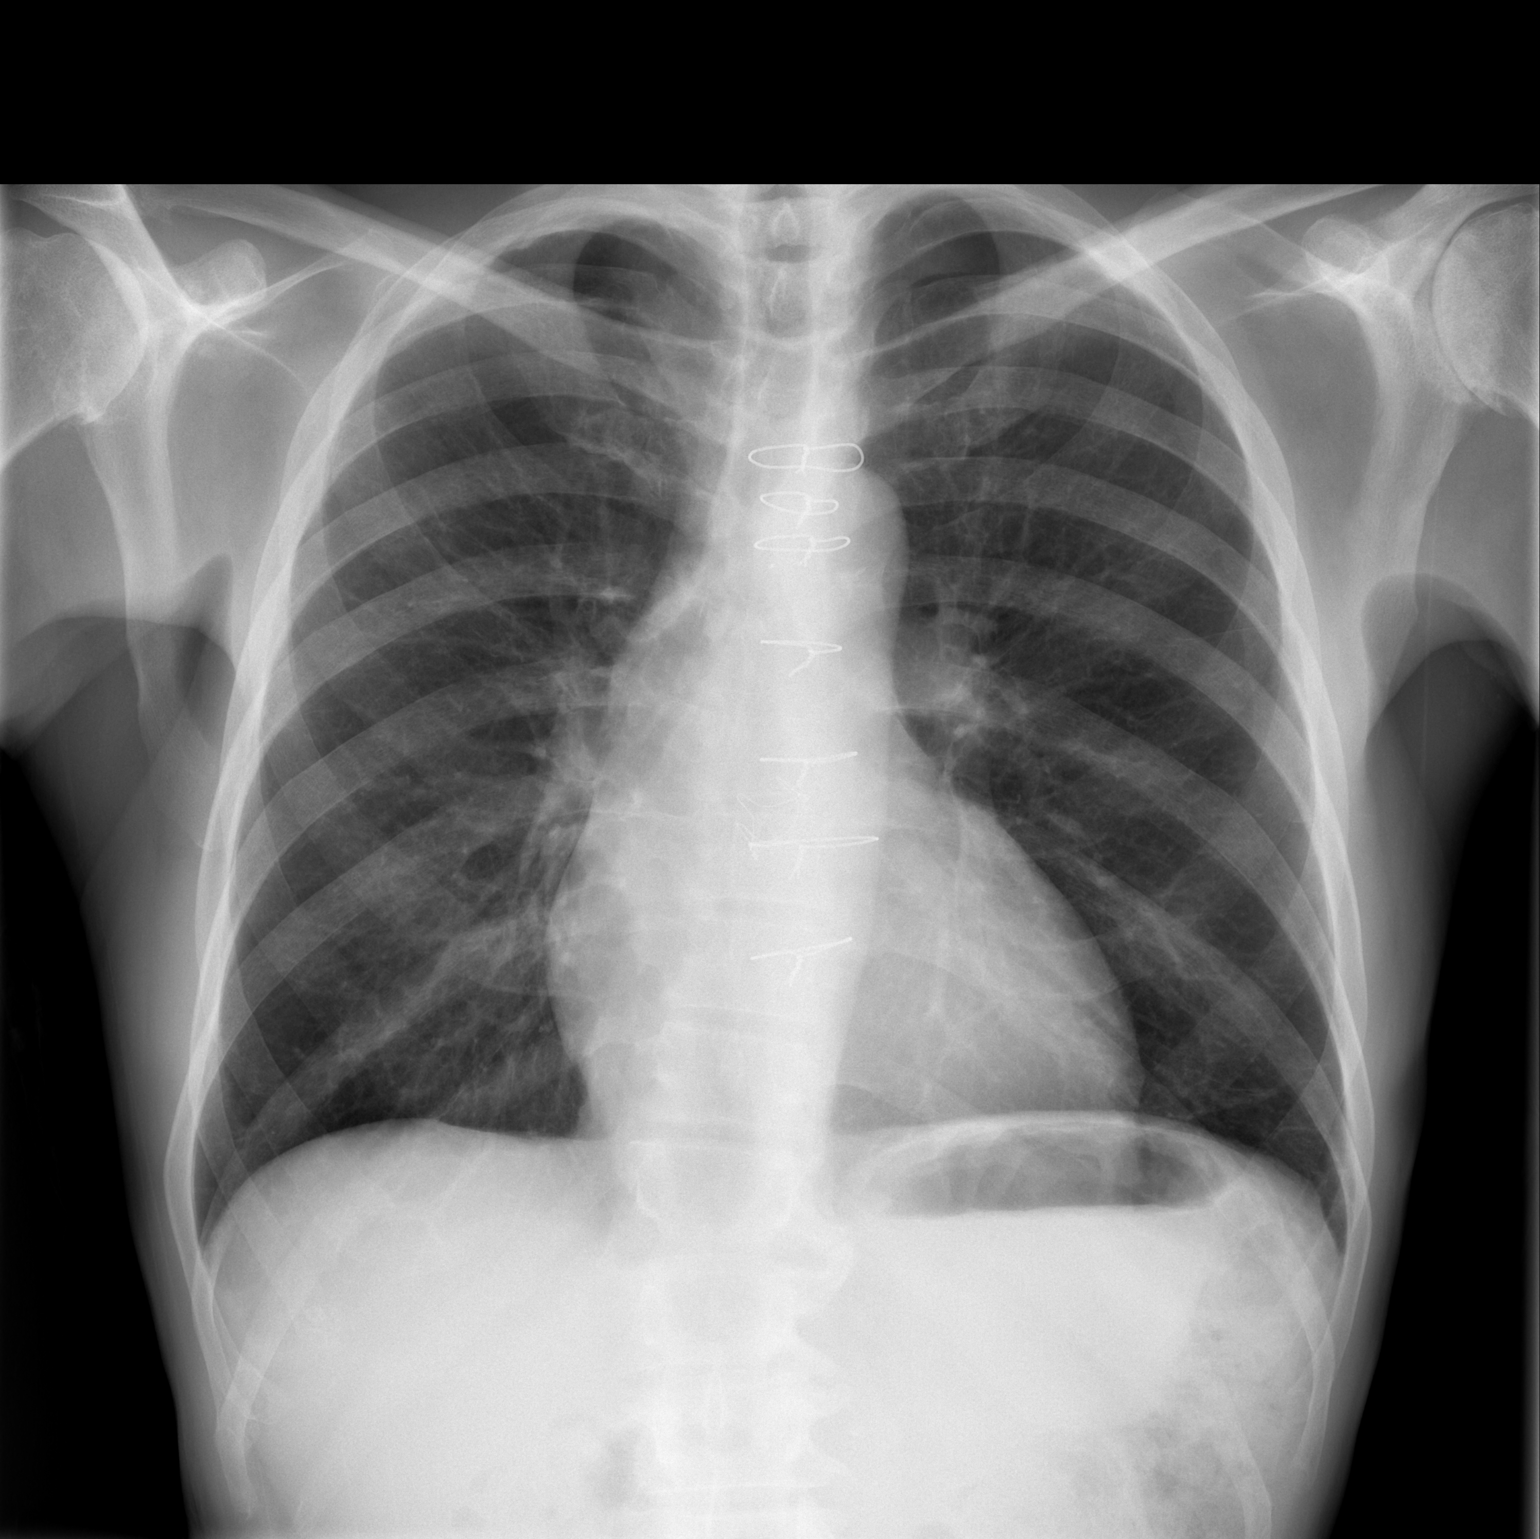

[w chest lat]
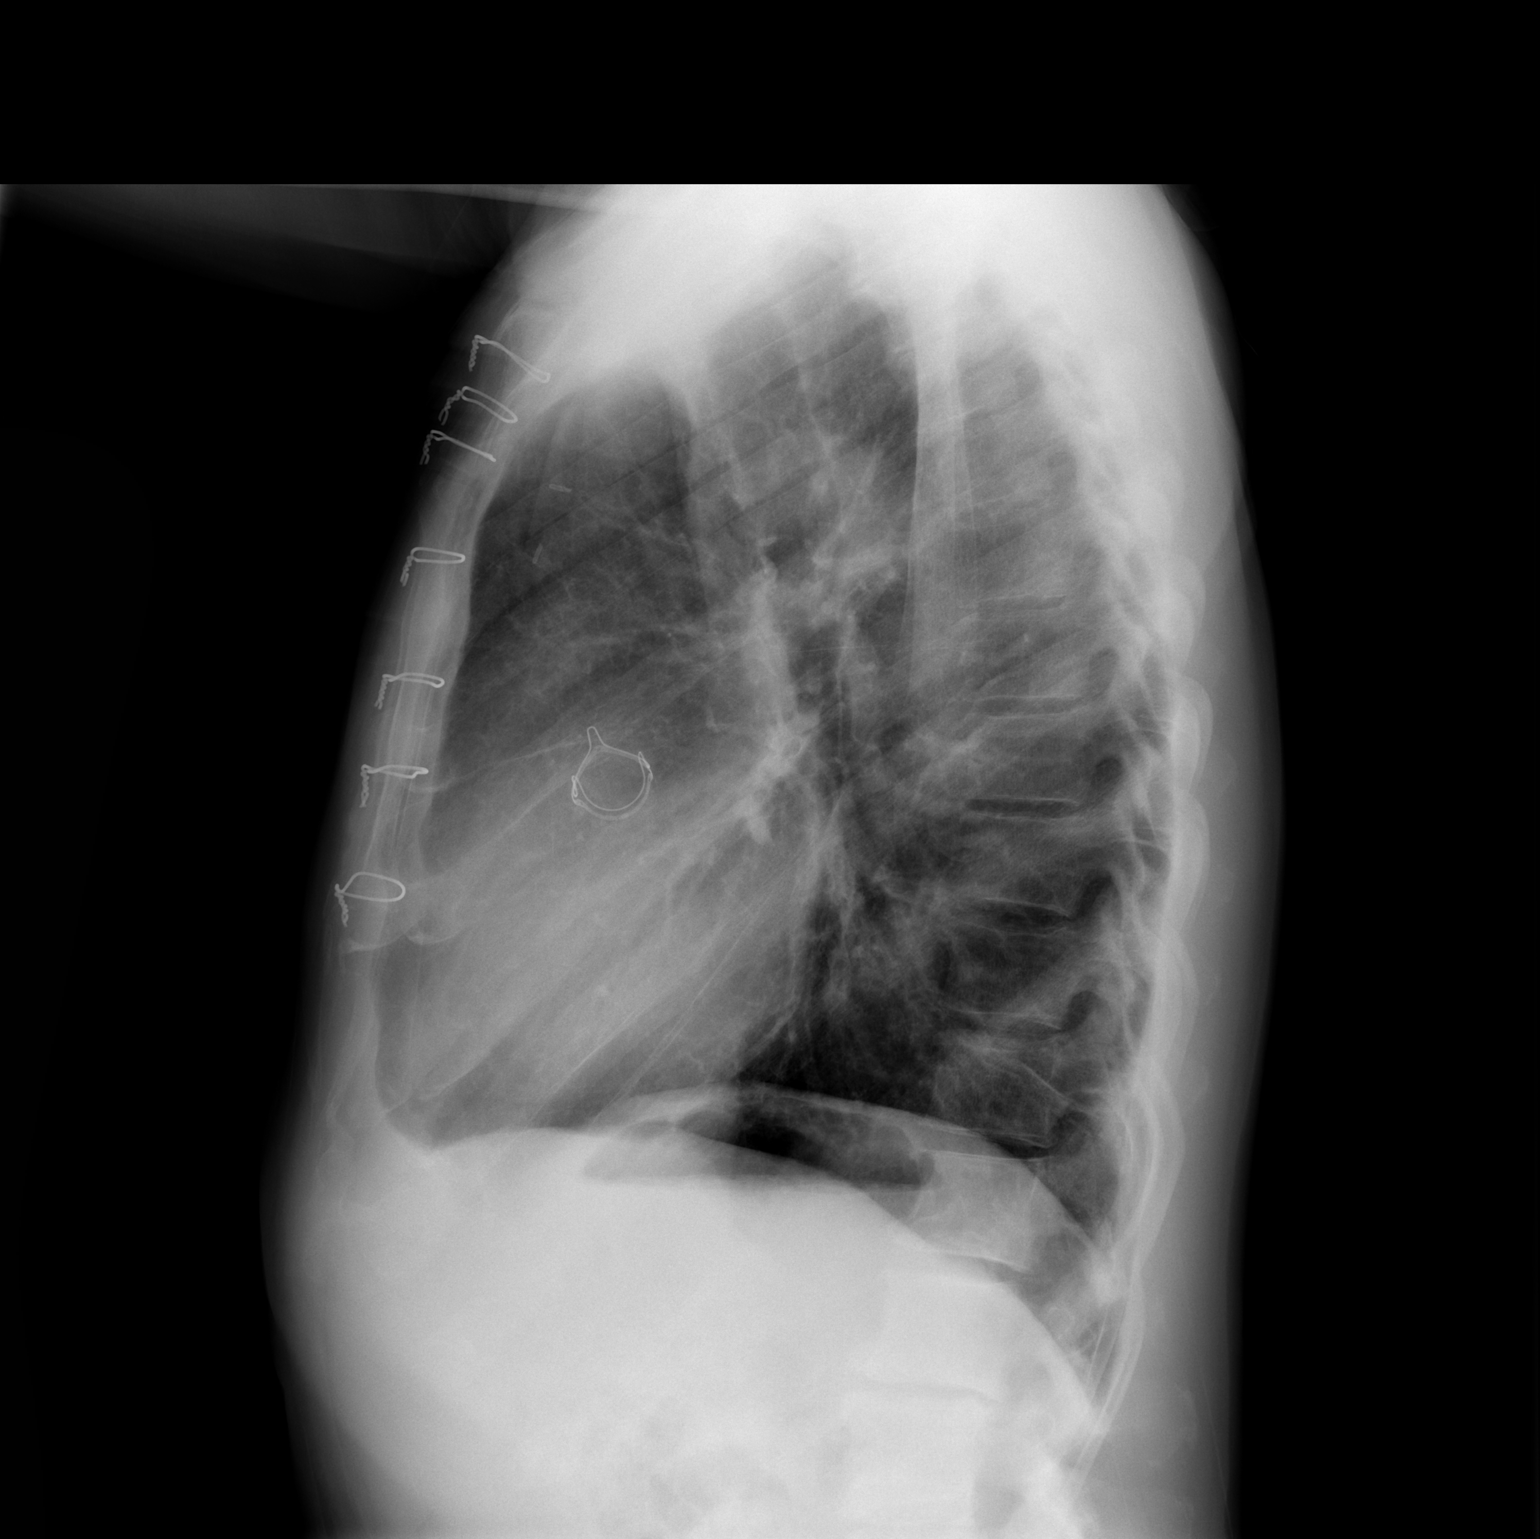

[2 of 2 positions shown; findings below may reference images not displayed]

FINDINGS: There is no edema or consolidation. The heart is upper normal in
size with pulmonary vascularity within normal limits. Patient is
status post aortic valve replacement. No adenopathy. No
pneumothorax. There is degenerative change in the shoulders.
IMPRESSION: No edema or consolidation. Heart upper normal in size with cardiac
contour within normal limits. Aortic valve replacement present.

## 2016-04-08 DIAGNOSIS — E119 Type 2 diabetes mellitus without complications: Secondary | ICD-10-CM | POA: Diagnosis not present

## 2016-04-08 DIAGNOSIS — Z79899 Other long term (current) drug therapy: Secondary | ICD-10-CM | POA: Diagnosis not present

## 2016-04-08 DIAGNOSIS — I10 Essential (primary) hypertension: Secondary | ICD-10-CM | POA: Diagnosis not present

## 2016-04-12 DIAGNOSIS — N281 Cyst of kidney, acquired: Secondary | ICD-10-CM | POA: Diagnosis not present

## 2016-04-12 DIAGNOSIS — I10 Essential (primary) hypertension: Secondary | ICD-10-CM | POA: Diagnosis not present

## 2016-04-12 DIAGNOSIS — E785 Hyperlipidemia, unspecified: Secondary | ICD-10-CM | POA: Diagnosis not present

## 2016-04-12 DIAGNOSIS — K744 Secondary biliary cirrhosis: Secondary | ICD-10-CM | POA: Diagnosis not present

## 2016-04-12 DIAGNOSIS — K7469 Other cirrhosis of liver: Secondary | ICD-10-CM | POA: Diagnosis not present

## 2016-04-12 DIAGNOSIS — Z87891 Personal history of nicotine dependence: Secondary | ICD-10-CM | POA: Diagnosis not present

## 2016-04-12 DIAGNOSIS — K7460 Unspecified cirrhosis of liver: Secondary | ICD-10-CM | POA: Diagnosis not present

## 2016-04-12 DIAGNOSIS — B182 Chronic viral hepatitis C: Secondary | ICD-10-CM | POA: Diagnosis not present

## 2016-04-12 DIAGNOSIS — E119 Type 2 diabetes mellitus without complications: Secondary | ICD-10-CM | POA: Diagnosis not present

## 2016-04-21 ENCOUNTER — Encounter: Payer: Self-pay | Admitting: Physical Therapy

## 2016-04-21 ENCOUNTER — Ambulatory Visit: Payer: Medicare Other | Attending: Family Medicine | Admitting: Physical Therapy

## 2016-04-21 DIAGNOSIS — M25512 Pain in left shoulder: Secondary | ICD-10-CM | POA: Insufficient documentation

## 2016-04-21 DIAGNOSIS — M25612 Stiffness of left shoulder, not elsewhere classified: Secondary | ICD-10-CM | POA: Diagnosis not present

## 2016-04-21 DIAGNOSIS — M6281 Muscle weakness (generalized): Secondary | ICD-10-CM | POA: Diagnosis not present

## 2016-04-21 DIAGNOSIS — G8929 Other chronic pain: Secondary | ICD-10-CM | POA: Insufficient documentation

## 2016-04-21 NOTE — Patient Instructions (Addendum)
Table Top Stretch (Static)    Sitting at table, slide arm across table. Hold _5__ seconds. Repeat _10__ times each arm, alternating. Do __3_ sessions per day.   Copyright  VHI. All rights reserved.  Ball on Table: [employer] in circles, with right arm. Other arm is passive. Circle ___ times, clockwise then counterclockwise. Do ___ times, ___ times per day.  http://ss.exer.us/216   Copyright  VHI. All rights reserved.  ROM: Abduction    With left arm resting on table, palm up, bring head down toward arm and simultaneously move trunk away from table. Hold _5___ seconds.  Repeat __10__ times per set. Do __1__ sets per session. Do __3__ sessions per day.  http://orth.exer.us/760   Copyright  VHI. All rights reserved.  ROM: External Rotation    Keeping left forearm palm down on table, bend forward at waist until stretch is felt. Hold __5__ seconds. Repeat __10__ times per set. Do __1__ sets per session. Do __3__ sessions per day.  http://orth.exer.us/762   Copyright  VHI. All rights reserved.    ROM: Pendulum (Circular)    Let right arm move in circle clockwise, then counterclockwise, by rocking body weight in circular pattern. Circle ____ times each direction per set. Do ____ sets per session. Do ____ sessions per day.  http://orth.exer.us/794   Copyright  VHI. All rights reserved.  ROM: Pendulum (Circular)    Let right arm move in circle clockwise, then counterclockwise, by rocking body weight in circular pattern. Circle _10___ times each direction per set. Do __1__ sets per session. Do __3Internal Rotation (Passive)    Bring hand behind back, using other hand to assist. Hold _10___ seconds. Repeat __5__ times. Do _3___ sessions per day.  Copyright  VHI. All rights reserved.      [facility] [address], [place], [place] [address] Phone # [phone] Fax [phone]

## 2016-04-21 NOTE — Therapy (Signed)
[facility] [address], [place], [place], [address] Phone: [phone]   Fax:  [phone]  Physical Therapy Evaluation  Patient Details  Name: [name] MRN: [id number] Date of Birth: 02-08-1941 Referring Provider: Dr. Lujean Amel  Encounter Date: 04/21/2016      PT End of Session - 04/21/16 1139    Visit Number 1   Number of Visits 10   Date for PT Re-Evaluation 06/16/16   Authorization Type medicare g-code 10th visit; KX on 15th visit   PT Start Time 1100   PT Stop Time 1140   PT Time Calculation (min) 40 min   Activity Tolerance Patient tolerated treatment well   Behavior During Therapy [facility] for tasks assessed/performed      Past Medical History:  Diagnosis Date  . Acute epididymo-orchitis   . Aortic stenosis    s/p bioprosthetic AVR (Dr. Cyndia Bent) 11/2013  . Arthritis   . Diabetes mellitus without complication ([place])   . ED (erectile dysfunction)   . Essential hypertension, benign   . Heart murmur   . Hep C w/o coma, chronic ([place])   . History of blood transfusion   . History of DVT of lower extremity   . Hx of echocardiogram    Echo (8/15):  Severe LVH, EF 60-65%, no RWMA, Gr 3 DD, AVR ok (mean 21 mmHg), MAC, mild LAE, mild to mod RAE  . Hypercholesterolemia     Past Surgical History:  Procedure Laterality Date  . AORTIC VALVE REPLACEMENT N/A 11/19/2013   Procedure: AORTIC VALVE REPLACEMENT (AVR);  Surgeon: Gaye Pollack, MD;  Location: [place];  Service: Open Heart Surgery;  Laterality: N/A;  . BUNIONECTOMY Bilateral 2008  . CARDIAC CATHETERIZATION  10/01/13  . COLONOSCOPY  01/2010   internal hemorrhoids.  Dr Deatra Ina  . ESOPHAGOGASTRODUODENOSCOPY N/A 04/24/2013   Procedure: ESOPHAGOGASTRODUODENOSCOPY (EGD);  Surgeon: Jerene Bears, MD;  Location: [facility] ENDOSCOPY;  Service: Endoscopy;  Laterality: N/A;  . INGUINAL HERNIA REPAIR Right   . INTRAOPERATIVE TRANSESOPHAGEAL ECHOCARDIOGRAM N/A 11/19/2013   Procedure: INTRAOPERATIVE TRANSESOPHAGEAL ECHOCARDIOGRAM;  Surgeon: Gaye Pollack, MD;  Location[facility];  Service: Open Heart Surgery;  Laterality: N/A;  . KNEE ARTHROSCOPY Left 05/2009   Dr Collier Salina  . LAMINECTOMY  06/2005   Dr Joya Salm. multiple lumbar level laminectomies.   Marland Kitchen LEFT AND RIGHT HEART CATHETERIZATION WITH CORONARY ANGIOGRAM N/A 10/01/2013   Procedure: LEFT AND RIGHT HEART CATHETERIZATION WITH CORONARY ANGIOGRAM;  Surgeon: Candee Furbish, MD;  Location[facility] CATH LAB;  Service: Cardiovascular;  Laterality: N/A;  . TEE WITHOUT CARDIOVERSION  11/2003   Aoritic valve sclerosis.   . TONSILLECTOMY      There were no vitals filed for this visit.       Subjective Assessment - 04/21/16 1105    Subjective Patient reports several years ago his left shoulder hurt when he uses it more.  Patient quit going to the [facility] due to pain. Unable to garden due to pain. Wife washes his back.    Patient Stated Goals improve left shoulder ROM and decrease pain   Currently in Pain? Yes   Pain Score 10-Worst pain ever   Pain Location Shoulder   Pain Orientation Left   Pain Descriptors / Indicators Sharp;Shooting   Pain Type Chronic pain   Pain Onset More than a month ago   Pain Frequency Intermittent   Aggravating Factors  gardening, reaching overhead, reaching behind neck, pain with laying in left side  Pain Relieving Factors arm at side   Effect of Pain on Daily Activities wife helps him because he is unable to do things   Multiple Pain Sites No            [facility] PT Assessment - 04/21/16 0001      Assessment   Medical Diagnosis M25.512 left anterior shoulder pain   Referring Provider Dr. Lauretta Grill [name]   Onset Date/Surgical Date 10/21/15   Hand Dominance Left   Prior Therapy none     Precautions   Precautions None     Restrictions   Weight Bearing Restrictions No     Balance Screen   Has the patient fallen in the past 6 months No   Has the patient had a decrease in activity level  because of a fear of falling?  No   Is the patient reluctant to leave their home because of a fear of falling?  No     Home Ecologist residence     Prior Function   Level of Independence Independent with basic ADLs   Vocation Retired     Associate Professor   Overall Cognitive Status Within Functional Limits for tasks assessed     Observation/Other Assessments   Focus on Therapeutic Outcomes (FOTO)  49% limitaiton  goal is 33% limitation     Posture/Postural Control   Posture/Postural Control No significant limitations     ROM / Strength   AROM / PROM / Strength AROM;PROM;Strength     AROM   AROM Assessment Site Shoulder   Right/Left Shoulder Left   Left Shoulder Extension 60 Degrees   Left Shoulder Flexion 75 Degrees   Left Shoulder ABduction 50 Degrees   Left Shoulder Internal Rotation 45 Degrees   Left Shoulder External Rotation 25 Degrees     PROM   Left Shoulder Flexion 122 Degrees   Left Shoulder ABduction 90 Degrees   Left Shoulder Internal Rotation 55 Degrees   Left Shoulder External Rotation 30 Degrees     Strength   Strength Assessment Site Shoulder   Right/Left Shoulder Left   Left Shoulder Flexion 2/5   Left Shoulder Extension 4+/5   Left Shoulder ABduction 2-/5   Left Shoulder Internal Rotation 2/5   Left Shoulder External Rotation 2/5     Palpation   Palpation comment muscle atrophy of left shoulder;      Ambulation/Gait   Ambulation/Gait No                             PT Short Term Goals - 04/21/16 1146      PT SHORT TERM GOAL #1   Title independent with initial HEP   Time 4   Period Weeks   Status New     PT SHORT TERM GOAL #2   Title left shoulder pain decreased >/= 25% with daily activities.    Time 4   Period Weeks   Status New     PT SHORT TERM GOAL #3   Title ability to reach behind his back with >/= 25% greater ease due to increase left shoulder ROM   Time 4   Period Weeks   Status  New     PT SHORT TERM GOAL #4   Title left shoulder strength >/= 3/5 making daily activities easier   Time 4   Period Weeks   Status New  PT Long Term Goals - May 05, 2016 1132      PT LONG TERM GOAL #1   Title independent with HEP   Time 8   Period Weeks   Status New     PT LONG TERM GOAL #2   Title increase left shoulder internal rotation so he can wash his back independently   Time 8   Period Weeks   Status New     PT LONG TERM GOAL #3   Title increased left shoulder flexion strength >/= 4/5 so he can hammer stakes in ground to plant vegetables   Time 8   Period Weeks   Status New     PT LONG TERM GOAL #4   Title reach overhead with left arm to get an item in overhead cabinet due to AROM flexion >/= 130 degrees   Time 8   Period Weeks   Status New     PT LONG TERM GOAL #5   Title FOTO score is </= 33% limitation    Time 8   Period Weeks   Status New     Additional Long Term Goals   Additional Long Term Goals Yes     PT LONG TERM GOAL #6   Title pain with left shoulder during daily activities decreased >/=75%   Time 5   Period Weeks   Status New               Plan - 05-May-2016 1140    Clinical Impression Statement Patient is a 75 year old male with chronic shoulder pain and limited ROM.  Patient reports pain has been worse in the past 6 months.  Patient reports pain ranges from 0-10/10 pain with different movements that is sharp and shooting. Patient is left handed.  Left shoulder ROM is limited by 50%.  Left shoulder strength is 2/5.  Patient wife has to wash his back due to not being able to do it on his own.  Patient has stopped gardening due to decrease left shoulder ROM and strength.  Patient has stopped going to the gym due to shoulder pain.  FOTO score is 49% limitation.  Patient is low complexity evaluation with evolving pain and decreased ROM and no comorbidities to affect plan of care. Patient will benefit from skilled therapy to improve  left shoulder ROM, strength and decrease pain.    Rehab Potential Excellent   Clinical Impairments Affecting Rehab Potential None   PT Frequency 3x / week   PT Duration 8 weeks   PT Treatment/Interventions Electrical Stimulation;Iontophoresis '4mg'$ /ml Dexamethasone;Moist Heat;Ultrasound;Patient/family education;Neuromuscular re-education;Therapeutic exercise;Therapeutic activities;Manual techniques;Passive range of motion;Taping;Dry needling   PT Next Visit Plan left shoulder joint mobilization, scapula mobilization, shoulder isometrics, shoulder shrugs, pulleys; modalities  as needed   PT Home Exercise Plan shoulder isometrics   Recommended Other Services None   Consulted and Agree with Plan of Care Patient      Patient will benefit from skilled therapeutic intervention in order to improve the following deficits and impairments:  Pain, Decreased mobility, Decreased strength, Impaired flexibility, Decreased activity tolerance, Decreased endurance, Increased muscle spasms, Impaired UE functional use, Decreased range of motion  Visit Diagnosis: Stiffness of left shoulder, not elsewhere classified  Muscle weakness (generalized)  Chronic left shoulder pain      G-Codes - 05-May-2016 1139    Functional Assessment Tool Used FOTO score is 49% limitation   Functional Limitation Other PT primary   Other PT Primary Current Status (N9892) At least 40 percent but less  than 60 percent impaired, limited or restricted   Other PT Primary Goal Status (E7209) At least 20 percent but less than 40 percent impaired, limited or restricted       Problem List Patient Active Problem List   Diagnosis Date Noted  . Elevated LFTs 08/26/2014  . S/P AVR (aortic valve replacement) 11/22/2013  . Aortic stenosis, severe 11/19/2013  . Aortic stenosis 09/03/2013  . Hyperlipidemia 09/03/2013  . Weight loss 09/03/2013  . Coronary artery disease 09/03/2013  . Hepatitis C 05/29/2013  . Unspecified gastritis and  gastroduodenitis without mention of hemorrhage 04/24/2013  . Iron deficiency anemia due to chronic blood loss 04/23/2013  . Hypertension   . Diabetes mellitus without complication ([place])   . Shortness of breath 04/22/2013    Earlie Counts, PT 04/21/16 11:50 AM   [facility] [address] [place], [place], [address] Phone: [phone]   Fax:  [phone]  Name: [name] MRN: [id number] Date of Birth: 05-Apr-1941

## 2016-04-25 ENCOUNTER — Encounter: Payer: Self-pay | Admitting: Physical Therapy

## 2016-04-25 ENCOUNTER — Ambulatory Visit: Payer: Medicare Other | Admitting: Physical Therapy

## 2016-04-25 DIAGNOSIS — M6281 Muscle weakness (generalized): Secondary | ICD-10-CM | POA: Diagnosis not present

## 2016-04-25 DIAGNOSIS — G8929 Other chronic pain: Secondary | ICD-10-CM

## 2016-04-25 DIAGNOSIS — M25612 Stiffness of left shoulder, not elsewhere classified: Secondary | ICD-10-CM | POA: Diagnosis not present

## 2016-04-25 DIAGNOSIS — M25512 Pain in left shoulder: Secondary | ICD-10-CM | POA: Diagnosis not present

## 2016-04-25 NOTE — Patient Instructions (Signed)
ROM: Towel Stretch - with Interior Rotation    Pull left arm up behind back by pulling towel up with other arm. Hold __5__ seconds. Repeat __10__ times per set. Do ____ sets per session. Do ____ sessions per day.  http://orth.exer.us/888   Copyright  VHI. All rights reserved.  AROM: Lateral Neck Flexion    Slowly tilt head toward one shoulder, then the other. Hold each position _30___ seconds. Repeat _3___ times per set. Do ____ sets per session. Do ____ sessions per day  Repeat with stretching head looking into arm pit.   Strengthening: Isometric Abduction    Using wall for resistance, press left arm into ball using light pressure. Hold _5___ seconds. Repeat __5__ times per set. Do ____ sets per session. Do ____ sessions per day.  http://orth.exer.us/806   Copyright  VHI. All rights reserved.  Extension (Isometric)    Place left bent elbow and back of arm against wall. Press elbow against wall. Hold __5__ seconds. Repeat __5__ times. Do ____ sessions per day.  http://gt2.exer.us/111   Copyright  VHI. All rights reserved.  Flexion (Isometric)    Press right fist against wall. Hold ____ seconds. Repeat ____ times. Do ____ sessions per day.  http://gt2.exer.us/113   Copyright  VHI. All rights reserved.  Internal Rotation (Isometric)    Place palm of right fist against door frame, with elbow bent. Press fist against door frame. Hold ____ seconds. Repeat ____ times. Do ____ sessions per day.  http://gt2.exer.us/107   Copyright  VHI. All rights reserved.  Strengthening: Isometric External Rotation    Using wall to provide resistance, and keeping right arm at side, press back of hand into ball using light pressure. Hold ____ seconds. Repeat ____ times per set. Do ____ sets per session. Do ____ sessions per day.  http://orth.exer.us/814   Copyright  VHI. All rights reserved.      [place] [address], [place], [place] [address] Phone # [phone] Fax [phone]  Zannie Cove, PT 04/25/16 10:57 AM

## 2016-04-25 NOTE — Therapy (Signed)
[facility] [address], [place], [place], [address] Phone: [phone]   Fax:  [phone]  Physical Therapy Treatment  Patient Details  Name: [name] MRN: [id number] Date of Birth: 1940/07/04 Referring Provider: Dr. Lujean Amel  Encounter Date: 04/25/2016      PT End of Session - 04/25/16 1100    Visit Number 2   Number of Visits 10   Date for PT Re-Evaluation 06/16/16   Authorization Type medicare g-code 10th visit; KX on 15th visit   PT Start Time 1015   PT Stop Time 1100   PT Time Calculation (min) 45 min   Activity Tolerance Patient tolerated treatment well   Behavior During Therapy [facility] for tasks assessed/performed      Past Medical History:  Diagnosis Date  . Acute epididymo-orchitis   . Aortic stenosis    s/p bioprosthetic AVR (Dr. Cyndia Bent) 11/2013  . Arthritis   . Diabetes mellitus without complication ([place])   . ED (erectile dysfunction)   . Essential hypertension, benign   . Heart murmur   . Hep C w/o coma, chronic ([place])   . History of blood transfusion   . History of DVT of lower extremity   . Hx of echocardiogram    Echo (8/15):  Severe LVH, EF 60-65%, no RWMA, Gr 3 DD, AVR ok (mean 21 mmHg), MAC, mild LAE, mild to mod RAE  . Hypercholesterolemia     Past Surgical History:  Procedure Laterality Date  . AORTIC VALVE REPLACEMENT N/A 11/19/2013   Procedure: AORTIC VALVE REPLACEMENT (AVR);  Surgeon: Gaye Pollack, MD;  Location: [place];  Service: Open Heart Surgery;  Laterality: N/A;  . BUNIONECTOMY Bilateral 2008  . CARDIAC CATHETERIZATION  10/01/13  . COLONOSCOPY  01/2010   internal hemorrhoids.  Dr Deatra Ina  . ESOPHAGOGASTRODUODENOSCOPY N/A 04/24/2013   Procedure: ESOPHAGOGASTRODUODENOSCOPY (EGD);  Surgeon: Jerene Bears, MD;  Location[facility] ENDOSCOPY;  Service: Endoscopy;  Laterality: N/A;  . INGUINAL HERNIA REPAIR Right   . INTRAOPERATIVE TRANSESOPHAGEAL ECHOCARDIOGRAM N/A 11/19/2013   Procedure: INTRAOPERATIVE TRANSESOPHAGEAL ECHOCARDIOGRAM;  Surgeon: Gaye Pollack, MD;  Location: [facility] OR;  Service: Open Heart Surgery;  Laterality: N/A;  . KNEE ARTHROSCOPY Left 05/2009   Dr Collier Salina  . LAMINECTOMY  06/2005   Dr Joya Salm. multiple lumbar level laminectomies.   Marland Kitchen LEFT AND RIGHT HEART CATHETERIZATION WITH CORONARY ANGIOGRAM N/A 10/01/2013   Procedure: LEFT AND RIGHT HEART CATHETERIZATION WITH CORONARY ANGIOGRAM;  Surgeon: Candee Furbish, MD;  Location: [facility] CATH LAB;  Service: Cardiovascular;  Laterality: N/A;  . TEE WITHOUT CARDIOVERSION  11/2003   Aoritic valve sclerosis.   . TONSILLECTOMY      There were no vitals filed for this visit.      Subjective Assessment - 04/25/16 1018    Subjective Pt drove down to [facility] and shoulder started really bothering him 5 hours into the drive.     Patient Stated Goals improve left shoulder ROM and decrease pain   Currently in Pain? Yes   Pain Score 5    Pain Location Shoulder   Pain Orientation Left   Pain Descriptors / Indicators Sharp;Shooting   Pain Type Chronic pain   Pain Onset More than a month ago   Multiple Pain Sites No                         [facility] Adult PT Treatment/Exercise - 04/25/16 0001      Exercises  Exercises Shoulder     Shoulder Exercises: Prone   Retraction Strengthening;Left;15 reps   Flexion Strengthening;Left;15 reps;5 reps   Extension Strengthening;Left;15 reps   Horizontal ABduction 1 Strengthening;Left;5 reps     Shoulder Exercises: Isometric Strengthening   Flexion 5X5"   Extension 5X5"   External Rotation 5X5"   Internal Rotation 5X5"   ABduction 5X5"     Shoulder Exercises: Stretch   Other Shoulder Stretches upper trap and levator stretch 3x30sec Lt side only     Manual Therapy   Manual Therapy Joint mobilization;Soft tissue mobilization   Joint Mobilization [facility] joint grade II-III, distractoin   Soft tissue mobilization Lt shoulder: infraspinatus, deltoid, upper  traps, pecs                PT Education - 04/25/16 1100    Education provided Yes   Person(s) Educated Patient   Methods Explanation;Demonstration;Verbal cues;Handout   Comprehension Verbalized understanding;Returned demonstration          PT Short Term Goals - 04/21/16 1146      PT SHORT TERM GOAL #1   Title independent with initial HEP   Time 4   Period Weeks   Status New     PT SHORT TERM GOAL #2   Title left shoulder pain decreased >/= 25% with daily activities.    Time 4   Period Weeks   Status New     PT SHORT TERM GOAL #3   Title ability to reach behind his back with >/= 25% greater ease due to increase left shoulder ROM   Time 4   Period Weeks   Status New     PT SHORT TERM GOAL #4   Title left shoulder strength >/= 3/5 making daily activities easier   Time 4   Period Weeks   Status New           PT Long Term Goals - 04/21/16 1132      PT LONG TERM GOAL #1   Title independent with HEP   Time 8   Period Weeks   Status New     PT LONG TERM GOAL #2   Title increase left shoulder internal rotation so he can wash his back independently   Time 8   Period Weeks   Status New     PT LONG TERM GOAL #3   Title increased left shoulder flexion strength >/= 4/5 so he can hammer stakes in ground to plant vegetables   Time 8   Period Weeks   Status New     PT LONG TERM GOAL #4   Title reach overhead with left arm to get an item in overhead cabinet due to AROM flexion >/= 130 degrees   Time 8   Period Weeks   Status New     PT LONG TERM GOAL #5   Title FOTO score is </= 33% limitation    Time 8   Period Weeks   Status New     Additional Long Term Goals   Additional Long Term Goals Yes     PT LONG TERM GOAL #6   Title pain with left shoulder during daily activities decreased >/=75%   Time 5   Period Weeks   Status New               Plan - 04/25/16 1133    Clinical Impression Statement No progress to report due to first  treatment since eval.  Pt has stiffness and muscle  spasms in Lt shoulder pecs, anterior deltoids, upper traps and infraspinatus spasms and shoulder capsule hypomobility.  Pt responded well to manual therapy.  He did report increased pain when attempting prone abduction and flexion, so those exercises were defered.  Pt was able to perform isometric exercises successfully and add to HEP.  Pt continues to need skilled PT for improved strength and return to function and pain management.   Rehab Potential Excellent   Clinical Impairments Affecting Rehab Potential None   PT Frequency 3x / week   PT Duration 8 weeks   PT Treatment/Interventions Electrical Stimulation;Iontophoresis '4mg'$ /ml Dexamethasone;Moist Heat;Ultrasound;Patient/family education;Neuromuscular re-education;Therapeutic exercise;Therapeutic activities;Manual techniques;Passive range of motion;Taping;Dry needling   PT Next Visit Plan left shoulder joint mobilization, scapula mobilization, shoulder isometrics, shoulder shrugs, pulleys; modalities  as needed   Consulted and Agree with Plan of Care Patient      Patient will benefit from skilled therapeutic intervention in order to improve the following deficits and impairments:  Pain, Decreased mobility, Decreased strength, Impaired flexibility, Decreased activity tolerance, Decreased endurance, Increased muscle spasms, Impaired UE functional use, Decreased range of motion  Visit Diagnosis: Stiffness of left shoulder, not elsewhere classified  Muscle weakness (generalized)  Chronic left shoulder pain     Problem List Patient Active Problem List   Diagnosis Date Noted  . Elevated LFTs 08/26/2014  . S/P AVR (aortic valve replacement) 11/22/2013  . Aortic stenosis, severe 11/19/2013  . Aortic stenosis 09/03/2013  . Hyperlipidemia 09/03/2013  . Weight loss 09/03/2013  . Coronary artery disease 09/03/2013  . Hepatitis C 05/29/2013  . Unspecified gastritis and gastroduodenitis  without mention of hemorrhage 04/24/2013  . Iron deficiency anemia due to chronic blood loss 04/23/2013  . Hypertension   . Diabetes mellitus without complication ([place])   . Shortness of breath 04/22/2013    Zannie Cove, PT 04/25/2016, 11:47 AM  [facility] [address] [place], [place], [address] Phone: [phone]   Fax:  [phone]  Name: ANANIAS KOLANDER MRN: [id number] Date of Birth: 02/13/1941

## 2016-04-27 ENCOUNTER — Ambulatory Visit: Payer: Medicare Other | Admitting: Physical Therapy

## 2016-04-27 ENCOUNTER — Encounter: Payer: Self-pay | Admitting: Physical Therapy

## 2016-04-27 DIAGNOSIS — G8929 Other chronic pain: Secondary | ICD-10-CM

## 2016-04-27 DIAGNOSIS — M6281 Muscle weakness (generalized): Secondary | ICD-10-CM | POA: Diagnosis not present

## 2016-04-27 DIAGNOSIS — M25612 Stiffness of left shoulder, not elsewhere classified: Secondary | ICD-10-CM | POA: Diagnosis not present

## 2016-04-27 DIAGNOSIS — M25512 Pain in left shoulder: Secondary | ICD-10-CM | POA: Diagnosis not present

## 2016-04-27 NOTE — Patient Instructions (Signed)
Cane Overhead - Supine  Hold cane at thighs with both hands, extend arms straight over head. Hold _2__ seconds. Repeat __10_ times. Do _3__ times per day.  External Rotation (Eccentric), Active-Assist - Supine (Cane)  Lie on back, left arm out from side, elbow bent, forearm forward. Use cane to assist in gently pushing forearm back. Slowly lower for 3-5 seconds. ___10 reps per set, _2__ sets per day, _3__ days per week.  Copyright  VHI. All rights reserved.    BREATHE!!  #3 Exercise Hold cane up in the air with arms straight; move cane side to side 10x. Do 3x day with the other  exercises.

## 2016-04-27 NOTE — Therapy (Signed)
[facility] [address] [place], [place], [address] Phone: [phone]   Fax:  [phone]  Physical Therapy Treatment  Patient Details  Name: [name] MRN: [id number] Date of Birth: [birth year] Referring Provider: Dr. Lujean Amel  Encounter Date: 04/27/2016      PT End of Session - 04/27/16 1013    Visit Number 3   Number of Visits 10   Date for PT Re-Evaluation 06/16/16   Authorization Type medicare g-code 10th visit; KX on 15th visit   PT Start Time 1014   PT Stop Time 1100   PT Time Calculation (min) 46 min   Activity Tolerance Patient tolerated treatment well   Behavior During Therapy [facility] for tasks assessed/performed      Past Medical History:  Diagnosis Date  . Acute epididymo-orchitis   . Aortic stenosis    s/p bioprosthetic AVR (Dr. Cyndia Bent) 11/2013  . Arthritis   . Diabetes mellitus without complication ([place])   . ED (erectile dysfunction)   . Essential hypertension, benign   . Heart murmur   . Hep C w/o coma, chronic ([place])   . History of blood transfusion   . History of DVT of lower extremity   . Hx of echocardiogram    Echo (8/15):  Severe LVH, EF 60-65%, no RWMA, Gr 3 DD, AVR ok (mean 21 mmHg), MAC, mild LAE, mild to mod RAE  . Hypercholesterolemia     Past Surgical History:  Procedure Laterality Date  . AORTIC VALVE REPLACEMENT N/A 11/19/2013   Procedure: AORTIC VALVE REPLACEMENT (AVR);  Surgeon: Gaye Pollack, MD;  Location: [place];  Service: Open Heart Surgery;  Laterality: N/A;  . BUNIONECTOMY Bilateral 2008  . CARDIAC CATHETERIZATION  10/01/13  . COLONOSCOPY  01/2010   internal hemorrhoids.  Dr Deatra Ina  . ESOPHAGOGASTRODUODENOSCOPY N/A 04/24/2013   Procedure: ESOPHAGOGASTRODUODENOSCOPY (EGD);  Surgeon: Jerene Bears, MD;  Location: [facility] ENDOSCOPY;  Service: Endoscopy;  Laterality: N/A;  . INGUINAL HERNIA REPAIR Right   . INTRAOPERATIVE TRANSESOPHAGEAL ECHOCARDIOGRAM N/A 11/19/2013   Procedure: INTRAOPERATIVE TRANSESOPHAGEAL ECHOCARDIOGRAM;  Surgeon: Gaye Pollack, MD;  Location: [facility] OR;  Service: Open Heart Surgery;  Laterality: N/A;  . KNEE ARTHROSCOPY Left 05/2009   Dr Collier Salina  . LAMINECTOMY  06/2005   Dr Joya Salm. multiple lumbar level laminectomies.   Marland Kitchen LEFT AND RIGHT HEART CATHETERIZATION WITH CORONARY ANGIOGRAM N/A 10/01/2013   Procedure: LEFT AND RIGHT HEART CATHETERIZATION WITH CORONARY ANGIOGRAM;  Surgeon: Candee Furbish, MD;  Location: [facility] CATH LAB;  Service: Cardiovascular;  Laterality: N/A;  . TEE WITHOUT CARDIOVERSION  11/2003   Aoritic valve sclerosis.   . TONSILLECTOMY      There were no vitals filed for this visit.      Subjective Assessment - 04/27/16 1014    Subjective No pain this AM, shoulder feeling about the same.    Currently in Pain? --  No current pain   Multiple Pain Sites No                         [facility] Adult PT Treatment/Exercise - 04/27/16 0001      Shoulder Exercises: Supine   Other Supine Exercises Cane series on back for flexion, horizontal movements and ER 2x 10 each      Shoulder Exercises: Pulleys   Flexion 3 minutes   ABduction 3 minutes  more scapular plane     Shoulder Exercises: ROM/Strengthening   UBE (Upper Arm  Bike) L1 2x2 at end of treatment     Manual Therapy   Joint Mobilization A/P [facility] joint grade II-III, distractoin                PT Education - 04/27/16 1027    Education provided Yes   Education Details HEP for supine cane exs   Person(s) Educated Patient   Methods Explanation;Demonstration;Tactile cues;Verbal cues;Handout   Comprehension Verbalized understanding;Returned demonstration          PT Short Term Goals - 04/27/16 1018      PT SHORT TERM GOAL #1   Title independent with initial HEP   Time 4   Period Weeks   Status Achieved     PT SHORT TERM GOAL #2   Title left shoulder pain decreased >/= 25% with daily activities.    Time 4   Period Weeks   Status  On-going     PT SHORT TERM GOAL #3   Title ability to reach behind his back with >/= 25% greater ease due to increase left shoulder ROM   Time 4   Period Weeks   Status On-going     PT SHORT TERM GOAL #4   Title left shoulder strength >/= 3/5 making daily activities easier   Time 4   Period Weeks   Status On-going           PT Long Term Goals - 04/21/16 1132      PT LONG TERM GOAL #1   Title independent with HEP   Time 8   Period Weeks   Status New     PT LONG TERM GOAL #2   Title increase left shoulder internal rotation so he can wash his back independently   Time 8   Period Weeks   Status New     PT LONG TERM GOAL #3   Title increased left shoulder flexion strength >/= 4/5 so he can hammer stakes in ground to plant vegetables   Time 8   Period Weeks   Status New     PT LONG TERM GOAL #4   Title reach overhead with left arm to get an item in overhead cabinet due to AROM flexion >/= 130 degrees   Time 8   Period Weeks   Status New     PT LONG TERM GOAL #5   Title FOTO score is </= 33% limitation    Time 8   Period Weeks   Status New     Additional Long Term Goals   Additional Long Term Goals Yes     PT LONG TERM GOAL #6   Title pain with left shoulder during daily activities decreased >/=75%   Time 5   Period Weeks   Status New               Plan - 04/27/16 1017    Clinical Impression Statement Pt's PROM has improved since eval, no formal measurements taken today but obvious to eyeball. Pain was not limiting in treatment today, pt reports the pain comes with specific  movements.  Added to HEP today to include supine cane exercises which pt was able to demonstrate in session today.     Rehab Potential Excellent   Clinical Impairments Affecting Rehab Potential None   PT Frequency 3x / week   PT Duration 8 weeks   PT Treatment/Interventions Electrical Stimulation;Iontophoresis '4mg'$ /ml Dexamethasone;Moist Heat;Ultrasound;Patient/family  education;Neuromuscular re-education;Therapeutic exercise;Therapeutic activities;Manual techniques;Passive range of motion;Taping;Dry needling   PT Next Visit Plan  Manual techniques to increase ROM, AAROM/review cane exercises. Begin isometrics or scapular stabs.    Consulted and Agree with Plan of Care Patient      Patient will benefit from skilled therapeutic intervention in order to improve the following deficits and impairments:  Pain, Decreased mobility, Decreased strength, Impaired flexibility, Decreased activity tolerance, Decreased endurance, Increased muscle spasms, Impaired UE functional use, Decreased range of motion  Visit Diagnosis: Stiffness of left shoulder, not elsewhere classified  Muscle weakness (generalized)  Chronic left shoulder pain     Problem List Patient Active Problem List   Diagnosis Date Noted  . Elevated LFTs 08/26/2014  . S/P AVR (aortic valve replacement) 11/22/2013  . Aortic stenosis, severe 11/19/2013  . Aortic stenosis 09/03/2013  . Hyperlipidemia 09/03/2013  . Weight loss 09/03/2013  . Coronary artery disease 09/03/2013  . Hepatitis C 05/29/2013  . Unspecified gastritis and gastroduodenitis without mention of hemorrhage 04/24/2013  . Iron deficiency anemia due to chronic blood loss 04/23/2013  . Hypertension   . Diabetes mellitus without complication ([place])   . Shortness of breath 04/22/2013    Amee Boothe, PTA 04/27/2016, 10:57 AM   [facility] [address] [place], [place], [address] Phone: [phone]   Fax:  [phone]  Name: [name] MRN: [id number] Date of Birth: 1940-12-22

## 2016-04-29 ENCOUNTER — Encounter: Payer: Self-pay | Admitting: Physical Therapy

## 2016-04-29 ENCOUNTER — Ambulatory Visit: Payer: Medicare Other | Admitting: Physical Therapy

## 2016-04-29 DIAGNOSIS — G8929 Other chronic pain: Secondary | ICD-10-CM

## 2016-04-29 DIAGNOSIS — M6281 Muscle weakness (generalized): Secondary | ICD-10-CM

## 2016-04-29 DIAGNOSIS — M25612 Stiffness of left shoulder, not elsewhere classified: Secondary | ICD-10-CM | POA: Diagnosis not present

## 2016-04-29 DIAGNOSIS — M25512 Pain in left shoulder: Secondary | ICD-10-CM

## 2016-04-29 NOTE — Therapy (Signed)
[facility] [address] [place], [place], [address] Phone: [phone]   Fax:  [phone]  Physical Therapy Treatment  Patient Details  Name: [name] MRN: [id number] Date of Birth: Mar 18, 1941 Referring Provider: Dr. Lujean Amel  Encounter Date: 04/29/2016      PT End of Session - 04/29/16 1015    Visit Number 4   Number of Visits 10   Date for PT Re-Evaluation 06/16/16   Authorization Type medicare g-code 10th visit; KX on 15th visit   PT Start Time 1012   PT Stop Time 1055   PT Time Calculation (min) 43 min   Activity Tolerance Patient tolerated treatment well   Behavior During Therapy [facility] for tasks assessed/performed      Past Medical History:  Diagnosis Date  . Acute epididymo-orchitis   . Aortic stenosis    s/p bioprosthetic AVR (Dr. Cyndia Bent) 11/2013  . Arthritis   . Diabetes mellitus without complication ([place])   . ED (erectile dysfunction)   . Essential hypertension, benign   . Heart murmur   . Hep C w/o coma, chronic ([place])   . History of blood transfusion   . History of DVT of lower extremity   . Hx of echocardiogram    Echo (8/15):  Severe LVH, EF 60-65%, no RWMA, Gr 3 DD, AVR ok (mean 21 mmHg), MAC, mild LAE, mild to mod RAE  . Hypercholesterolemia     Past Surgical History:  Procedure Laterality Date  . AORTIC VALVE REPLACEMENT N/A 11/19/2013   Procedure: AORTIC VALVE REPLACEMENT (AVR);  Surgeon: Gaye Pollack, MD;  Location: [place];  Service: Open Heart Surgery;  Laterality: N/A;  . BUNIONECTOMY Bilateral 2008  . CARDIAC CATHETERIZATION  10/01/13  . COLONOSCOPY  01/2010   internal hemorrhoids.  Dr Deatra Ina  . ESOPHAGOGASTRODUODENOSCOPY N/A 04/24/2013   Procedure: ESOPHAGOGASTRODUODENOSCOPY (EGD);  Surgeon: Jerene Bears, MD;  Location: [facility] ENDOSCOPY;  Service: Endoscopy;  Laterality: N/A;  . INGUINAL HERNIA REPAIR Right   . INTRAOPERATIVE TRANSESOPHAGEAL ECHOCARDIOGRAM N/A 11/19/2013   Procedure: INTRAOPERATIVE TRANSESOPHAGEAL ECHOCARDIOGRAM;  Surgeon: Gaye Pollack, MD;  Location: [facility] OR;  Service: Open Heart Surgery;  Laterality: N/A;  . KNEE ARTHROSCOPY Left 05/2009   Dr Collier Salina  . LAMINECTOMY  06/2005   Dr Joya Salm. multiple lumbar level laminectomies.   Marland Kitchen LEFT AND RIGHT HEART CATHETERIZATION WITH CORONARY ANGIOGRAM N/A 10/01/2013   Procedure: LEFT AND RIGHT HEART CATHETERIZATION WITH CORONARY ANGIOGRAM;  Surgeon: Candee Furbish, MD;  Location: [facility] CATH LAB;  Service: Cardiovascular;  Laterality: N/A;  . TEE WITHOUT CARDIOVERSION  11/2003   Aoritic valve sclerosis.   . TONSILLECTOMY      There were no vitals filed for this visit.      Subjective Assessment - 04/29/16 1015    Subjective No pain today, reports no changes.  States internal rotation is the hardest motion   Currently in Pain? No/denies                         [facility] Adult PT Treatment/Exercise - 04/29/16 0001      Shoulder Exercises: Supine   Other Supine Exercises Cane series on back for flexion, horizontal movements and ER 2x 10 each    Other Supine Exercises standing shoulder stabilization with towel 20x 4 planes     Shoulder Exercises: Seated   Flexion AAROM;Both;15 reps  table slides     Shoulder Exercises: Pulleys   Flexion 3  minutes   ABduction 3 minutes  more scapular plane     Shoulder Exercises: ROM/Strengthening   UBE (Upper Arm Bike) L1 2x2 at end of treatment     Shoulder Exercises: Isometric Strengthening   External Rotation 5X5";Theraband  holding band with side step   Theraband Level (External Rotation) Level 1 (Yellow)     Manual Therapy   Manual Therapy Joint mobilization;Soft tissue mobilization   Joint Mobilization A/P GH joint grade II-III, distractoin   Soft tissue mobilization Lt shoulder: anterior deltoid, upper traps, pecs                  PT Short Term Goals - 04/27/16 1018      PT SHORT TERM GOAL #1   Title independent with initial  HEP   Time 4   Period Weeks   Status Achieved     PT SHORT TERM GOAL #2   Title left shoulder pain decreased >/= 25% with daily activities.    Time 4   Period Weeks   Status On-going     PT SHORT TERM GOAL #3   Title ability to reach behind his back with >/= 25% greater ease due to increase left shoulder ROM   Time 4   Period Weeks   Status On-going     PT SHORT TERM GOAL #4   Title left shoulder strength >/= 3/5 making daily activities easier   Time 4   Period Weeks   Status On-going           PT Long Term Goals - 04/21/16 1132      PT LONG TERM GOAL #1   Title independent with HEP   Time 8   Period Weeks   Status New     PT LONG TERM GOAL #2   Title increase left shoulder internal rotation so he can wash his back independently   Time 8   Period Weeks   Status New     PT LONG TERM GOAL #3   Title increased left shoulder flexion strength >/= 4/5 so he can hammer stakes in ground to plant vegetables   Time 8   Period Weeks   Status New     PT LONG TERM GOAL #4   Title reach overhead with left arm to get an item in overhead cabinet due to AROM flexion >/= 130 degrees   Time 8   Period Weeks   Status New     PT LONG TERM GOAL #5   Title FOTO score is </= 33% limitation    Time 8   Period Weeks   Status New     Additional Long Term Goals   Additional Long Term Goals Yes     PT LONG TERM GOAL #6   Title pain with left shoulder during daily activities decreased >/=75%   Time 5   Period Weeks   Status New               Plan - 04/29/16 1024    Clinical Impression Statement ROM improved and reports feeling loosening in shoulder after treatment.  PT palpated less clicking in shoulder after manual mobs and soft tissue release of surrounding muscles.  Pt doing well with HEP and will continue.  Continues to have ROM limitation and weakness and skillled PT needed to progress in order to return to maximum function.   Rehab Potential Excellent    Clinical Impairments Affecting Rehab Potential None   PT Frequency 3x /  week   PT Duration 8 weeks   PT Treatment/Interventions Electrical Stimulation;Iontophoresis '4mg'$ /ml Dexamethasone;Moist Heat;Ultrasound;Patient/family education;Neuromuscular re-education;Therapeutic exercise;Therapeutic activities;Manual techniques;Passive range of motion;Taping;Dry needling   PT Next Visit Plan Manual techniques to increase ROM, progress strrength as tolerated   Consulted and Agree with Plan of Care Patient      Patient will benefit from skilled therapeutic intervention in order to improve the following deficits and impairments:  Pain, Decreased mobility, Decreased strength, Impaired flexibility, Decreased activity tolerance, Decreased endurance, Increased muscle spasms, Impaired UE functional use, Decreased range of motion  Visit Diagnosis: Stiffness of left shoulder, not elsewhere classified  Muscle weakness (generalized)  Chronic left shoulder pain     Problem List Patient Active Problem List   Diagnosis Date Noted  . Elevated LFTs 08/26/2014  . S/P AVR (aortic valve replacement) 11/22/2013  . Aortic stenosis, severe 11/19/2013  . Aortic stenosis 09/03/2013  . Hyperlipidemia 09/03/2013  . Weight loss 09/03/2013  . Coronary artery disease 09/03/2013  . Hepatitis C 05/29/2013  . Unspecified gastritis and gastroduodenitis without mention of hemorrhage 04/24/2013  . Iron deficiency anemia due to chronic blood loss 04/23/2013  . Hypertension   . Diabetes mellitus without complication ([place])   . Shortness of breath 04/22/2013    Zannie Cove, PT 04/29/2016, 11:58 AM  [facility] [address] [place], [place], [address] Phone: [phone]   Fax:  [phone]  Name: [name] MRN: [id number] Date of Birth: 1940/05/19

## 2016-05-04 ENCOUNTER — Encounter: Payer: Medicare Other | Admitting: Physical Therapy

## 2016-05-10 ENCOUNTER — Ambulatory Visit: Payer: Medicare Other | Attending: Family Medicine | Admitting: Physical Therapy

## 2016-05-10 ENCOUNTER — Encounter: Payer: Self-pay | Admitting: Physical Therapy

## 2016-05-10 DIAGNOSIS — M6281 Muscle weakness (generalized): Secondary | ICD-10-CM | POA: Insufficient documentation

## 2016-05-10 DIAGNOSIS — M25612 Stiffness of left shoulder, not elsewhere classified: Secondary | ICD-10-CM | POA: Insufficient documentation

## 2016-05-10 DIAGNOSIS — M25512 Pain in left shoulder: Secondary | ICD-10-CM | POA: Diagnosis not present

## 2016-05-10 DIAGNOSIS — G8929 Other chronic pain: Secondary | ICD-10-CM | POA: Diagnosis not present

## 2016-05-10 NOTE — Therapy (Signed)
[facility] [address], [place], [place], [address] Phone: [phone]   Fax:  [phone]  Physical Therapy Treatment  Patient Details  Name: [name] MRN: [id number] Date of Birth: 09/17/1940 Referring Provider: Dr. Lujean Amel  Encounter Date: 05/10/2016      PT End of Session - 05/10/16 1101    Visit Number 5   Number of Visits 10   Date for PT Re-Evaluation 06/16/16   Authorization Type medicare g-code 10th visit; KX on 15th visit   PT Start Time 1015   PT Stop Time 1100   PT Time Calculation (min) 45 min   Activity Tolerance Patient tolerated treatment well   Behavior During Therapy [facility] for tasks assessed/performed      Past Medical History:  Diagnosis Date  . Acute epididymo-orchitis   . Aortic stenosis    s/p bioprosthetic AVR (Dr. Cyndia Bent) 11/2013  . Arthritis   . Diabetes mellitus without complication ([place])   . ED (erectile dysfunction)   . Essential hypertension, benign   . Heart murmur   . Hep C w/o coma, chronic ([place])   . History of blood transfusion   . History of DVT of lower extremity   . Hx of echocardiogram    Echo (8/15):  Severe LVH, EF 60-65%, no RWMA, Gr 3 DD, AVR ok (mean 21 mmHg), MAC, mild LAE, mild to mod RAE  . Hypercholesterolemia     Past Surgical History:  Procedure Laterality Date  . AORTIC VALVE REPLACEMENT N/A 11/19/2013   Procedure: AORTIC VALVE REPLACEMENT (AVR);  Surgeon: Gaye Pollack, MD;  Location: [place];  Service: Open Heart Surgery;  Laterality: N/A;  . BUNIONECTOMY Bilateral 2008  . CARDIAC CATHETERIZATION  10/01/13  . COLONOSCOPY  01/2010   internal hemorrhoids.  Dr Deatra Ina  . ESOPHAGOGASTRODUODENOSCOPY N/A 04/24/2013   Procedure: ESOPHAGOGASTRODUODENOSCOPY (EGD);  Surgeon: Jerene Bears, MD;  Location: [facility] ENDOSCOPY;  Service: Endoscopy;  Laterality: N/A;  . INGUINAL HERNIA REPAIR Right   . INTRAOPERATIVE TRANSESOPHAGEAL ECHOCARDIOGRAM N/A 11/19/2013    Procedure: INTRAOPERATIVE TRANSESOPHAGEAL ECHOCARDIOGRAM;  Surgeon: Gaye Pollack, MD;  Location: [facility] OR;  Service: Open Heart Surgery;  Laterality: N/A;  . KNEE ARTHROSCOPY Left 05/2009   Dr Collier Salina  . LAMINECTOMY  06/2005   Dr Joya Salm. multiple lumbar level laminectomies.   Marland Kitchen LEFT AND RIGHT HEART CATHETERIZATION WITH CORONARY ANGIOGRAM N/A 10/01/2013   Procedure: LEFT AND RIGHT HEART CATHETERIZATION WITH CORONARY ANGIOGRAM;  Surgeon: Candee Furbish, MD;  Location: [facility] CATH LAB;  Service: Cardiovascular;  Laterality: N/A;  . TEE WITHOUT CARDIOVERSION  11/2003   Aoritic valve sclerosis.   . TONSILLECTOMY      There were no vitals filed for this visit.      Subjective Assessment - 05/10/16 1017    Subjective Pt reports minimal shoulder pain today.    Patient Stated Goals improve left shoulder ROM and decrease pain   Currently in Pain? Yes   Pain Score 3    Pain Location Shoulder   Pain Orientation Left   Pain Descriptors / Indicators Shooting;Sharp   Pain Type Chronic pain   Pain Onset More than a month ago   Pain Frequency Intermittent                         [facility] Adult PT Treatment/Exercise - 05/10/16 0001      Shoulder Exercises: Supine   Other Supine Exercises Cane series on back  for flexion, horizontal movements and ER 2x 10 each      Shoulder Exercises: Prone   Extension Strengthening;Left;15 reps   Horizontal ABduction 1 Strengthening;Left;5 reps     Shoulder Exercises: Standing   External Rotation Strengthening;Left;10 reps;Theraband  Isometric side stepping   Theraband Level (Shoulder External Rotation) Level 1 (Yellow)   Extension Strengthening;Left;10 reps;Theraband   Theraband Level (Shoulder Extension) Level 2 (Red)   Retraction Strengthening;10 reps;Theraband   Theraband Level (Shoulder Retraction) Level 2 (Red)   Other Standing Exercises 6 way shoulder isomatrics     Shoulder Exercises: Pulleys   Flexion 3 minutes   ABduction 3 minutes   more scapular plane     Shoulder Exercises: ROM/Strengthening   UBE (Upper Arm Bike) L1 2x2  Therapist present to discuss treatment     Manual Therapy   Manual Therapy Joint mobilization;Passive ROM;Taping   Manual therapy comments Lt shoulder stability tape   Joint Mobilization A/P GH joint mobilization   Passive ROM Lt shoulder all planes                  PT Short Term Goals - 05/10/16 1017      PT SHORT TERM GOAL #2   Title left shoulder pain decreased >/= 25% with daily activities.    Time 4   Period Weeks   Status On-going     PT SHORT TERM GOAL #3   Title ability to reach behind his back with >/= 25% greater ease due to increase left shoulder ROM   Time 4   Period Weeks   Status On-going     PT SHORT TERM GOAL #4   Title left shoulder strength >/= 3/5 making daily activities easier   Time 4   Period Weeks   Status On-going           PT Long Term Goals - 05/10/16 1018      PT LONG TERM GOAL #1   Title independent with HEP   Time 8   Period Weeks   Status On-going     PT LONG TERM GOAL #2   Title increase left shoulder internal rotation so he can wash his back independently   Time 8   Period Weeks   Status On-going     PT LONG TERM GOAL #3   Title increased left shoulder flexion strength >/= 4/5 so he can hammer stakes in ground to plant vegetables   Time 8   Period Weeks   Status On-going     PT LONG TERM GOAL #4   Title reach overhead with left arm to get an item in overhead cabinet due to AROM flexion >/= 130 degrees   Time 8   Period Weeks   Status On-going     PT LONG TERM GOAL #5   Title FOTO score is </= 33% limitation    Time 8   Period Weeks   Status On-going     PT LONG TERM GOAL #6   Title pain with left shoulder during daily activities decreased >/=75%   Time 5   Period Weeks   Status On-going               Plan - 05/10/16 1040    Clinical Impression Statement Pt able to tolerate all exercises without  increase in pain. Pt has decreased shoulder stability and decreased boimechanics for ROM. Tape to Lt shoulder to facilitate muscles for stability. Pt will continue to benefit from skilled therapy for shoulder  strength, stability, and increased ROM.    Rehab Potential Excellent   Clinical Impairments Affecting Rehab Potential None   PT Frequency 3x / week   PT Duration 8 weeks   PT Treatment/Interventions Electrical Stimulation;Iontophoresis 42m/ml Dexamethasone;Moist Heat;Ultrasound;Patient/family education;Neuromuscular re-education;Therapeutic exercise;Therapeutic activities;Manual techniques;Passive range of motion;Taping;Dry needling   PT Next Visit Plan Manual techniques to increase ROM, progress strrength as tolerated   Consulted and Agree with Plan of Care Patient      Patient will benefit from skilled therapeutic intervention in order to improve the following deficits and impairments:  Pain, Decreased mobility, Decreased strength, Impaired flexibility, Decreased activity tolerance, Decreased endurance, Increased muscle spasms, Impaired UE functional use, Decreased range of motion  Visit Diagnosis: Stiffness of left shoulder, not elsewhere classified  Muscle weakness (generalized)  Chronic left shoulder pain     Problem List Patient Active Problem List   Diagnosis Date Noted  . Elevated LFTs 08/26/2014  . S/P AVR (aortic valve replacement) 11/22/2013  . Aortic stenosis, severe 11/19/2013  . Aortic stenosis 09/03/2013  . Hyperlipidemia 09/03/2013  . Weight loss 09/03/2013  . Coronary artery disease 09/03/2013  . Hepatitis C 05/29/2013  . Unspecified gastritis and gastroduodenitis without mention of hemorrhage 04/24/2013  . Iron deficiency anemia due to chronic blood loss 04/23/2013  . Hypertension   . Diabetes mellitus without complication ([place]   . Shortness of breath 04/22/2013    KMikle BosworthPTA 05/10/2016, 11:26 AM  [facility] [address] [place] [address]Phone: [phone]  Fax:  [phone] Name: Daniel CERNEYMRN: [id number] of Birth: 8Aug 14, 1942

## 2016-05-12 ENCOUNTER — Ambulatory Visit: Payer: Medicare Other | Admitting: Physical Therapy

## 2016-05-12 ENCOUNTER — Encounter: Payer: Self-pay | Admitting: Physical Therapy

## 2016-05-12 DIAGNOSIS — M6281 Muscle weakness (generalized): Secondary | ICD-10-CM

## 2016-05-12 DIAGNOSIS — M25612 Stiffness of left shoulder, not elsewhere classified: Secondary | ICD-10-CM

## 2016-05-12 DIAGNOSIS — M25512 Pain in left shoulder: Secondary | ICD-10-CM | POA: Diagnosis not present

## 2016-05-12 DIAGNOSIS — G8929 Other chronic pain: Secondary | ICD-10-CM

## 2016-05-12 NOTE — Patient Instructions (Signed)

## 2016-05-12 NOTE — Therapy (Signed)
[facility] [address], [place], [place], [address] Phone: [phone]   Fax:  [phone]  Physical Therapy Treatment  Patient Details  Name: [name] MRN: [id number] Date of Birth: Nov 02, 1940 Referring Provider: Dr. Lujean Amel  Encounter Date: 05/12/2016      PT End of Session - 05/12/16 1017    Visit Number 6   Number of Visits 10   Date for PT Re-Evaluation 06/16/16   Authorization Type medicare g-code 10th visit; KX on 15th visit   PT Start Time 1015   PT Stop Time 1100   PT Time Calculation (min) 45 min   Activity Tolerance Patient tolerated treatment well   Behavior During Therapy [facility] for tasks assessed/performed      Past Medical History:  Diagnosis Date  . Acute epididymo-orchitis   . Aortic stenosis    s/p bioprosthetic AVR (Dr. Cyndia Bent) 11/2013  . Arthritis   . Diabetes mellitus without complication ([place])   . ED (erectile dysfunction)   . Essential hypertension, benign   . Heart murmur   . Hep C w/o coma, chronic ([place])   . History of blood transfusion   . History of DVT of lower extremity   . Hx of echocardiogram    Echo (8/15):  Severe LVH, EF 60-65%, no RWMA, Gr 3 DD, AVR ok (mean 21 mmHg), MAC, mild LAE, mild to mod RAE  . Hypercholesterolemia     Past Surgical History:  Procedure Laterality Date  . AORTIC VALVE REPLACEMENT N/A 11/19/2013   Procedure: AORTIC VALVE REPLACEMENT (AVR);  Surgeon: Gaye Pollack, MD;  Location: [place];  Service: Open Heart Surgery;  Laterality: N/A;  . BUNIONECTOMY Bilateral 2008  . CARDIAC CATHETERIZATION  10/01/13  . COLONOSCOPY  01/2010   internal hemorrhoids.  Dr Deatra Ina  . ESOPHAGOGASTRODUODENOSCOPY N/A 04/24/2013   Procedure: ESOPHAGOGASTRODUODENOSCOPY (EGD);  Surgeon: Jerene Bears, MD;  Location: [facility] ENDOSCOPY;  Service: Endoscopy;  Laterality: N/A;  . INGUINAL HERNIA REPAIR Right   . INTRAOPERATIVE TRANSESOPHAGEAL ECHOCARDIOGRAM N/A 11/19/2013   Procedure: INTRAOPERATIVE TRANSESOPHAGEAL ECHOCARDIOGRAM;  Surgeon: Gaye Pollack, MD;  Location: [facility] OR;  Service: Open Heart Surgery;  Laterality: N/A;  . KNEE ARTHROSCOPY Left 05/2009   Dr Collier Salina  . LAMINECTOMY  06/2005   Dr Joya Salm. multiple lumbar level laminectomies.   Marland Kitchen LEFT AND RIGHT HEART CATHETERIZATION WITH CORONARY ANGIOGRAM N/A 10/01/2013   Procedure: LEFT AND RIGHT HEART CATHETERIZATION WITH CORONARY ANGIOGRAM;  Surgeon: Candee Furbish, MD;  Location: [facility] CATH LAB;  Service: Cardiovascular;  Laterality: N/A;  . TEE WITHOUT CARDIOVERSION  11/2003   Aoritic valve sclerosis.   . TONSILLECTOMY      There were no vitals filed for this visit.      Subjective Assessment - 05/12/16 1016    Currently in Pain? Yes   Pain Score 1    Pain Location Shoulder   Pain Orientation Left   Pain Descriptors / Indicators Shooting;Sharp   Pain Type Chronic pain   Pain Onset More than a month ago   Pain Frequency Intermittent                         [facility] Adult PT Treatment/Exercise - 05/12/16 0001      Shoulder Exercises: Supine   Protraction 20 reps  Serratus reach x20   Horizontal ABduction Strengthening;Left;20 reps  shoulder 90 elbow extended   External Rotation Strengthening;Both;20 reps;Theraband   Theraband Level (Shoulder External  Rotation) Level 1 (Yellow)   Other Supine Exercises Manual isometrics  Arm extended resistance at forarm     Shoulder Exercises: Prone   Extension Strengthening;Left;15 reps   Horizontal ABduction 1 Strengthening;Left;5 reps     Shoulder Exercises: Standing   Other Standing Exercises 6 way shoulder isomatrics     Shoulder Exercises: ROM/Strengthening   UBE (Upper Arm Bike) L1 2x2  Therapist present to discuss treatment     Modalities   Modalities Iontophoresis     Iontophoresis   Type of Iontophoresis Dexamethasone  #1   Location Lt shoulder posterior   Dose 1.0 mL   Time 6 hour patch                PT  Education - 05/12/16 1052    Education provided Yes   Education Details Iontophorisis   Person(s) Educated Patient   Methods Explanation;Demonstration;Handout   Comprehension Verbalized understanding          PT Short Term Goals - 05/10/16 1017      PT SHORT TERM GOAL #2   Title left shoulder pain decreased >/= 25% with daily activities.    Time 4   Period Weeks   Status On-going     PT SHORT TERM GOAL #3   Title ability to reach behind his back with >/= 25% greater ease due to increase left shoulder ROM   Time 4   Period Weeks   Status On-going     PT SHORT TERM GOAL #4   Title left shoulder strength >/= 3/5 making daily activities easier   Time 4   Period Weeks   Status On-going           PT Long Term Goals - 05/10/16 1018      PT LONG TERM GOAL #1   Title independent with HEP   Time 8   Period Weeks   Status On-going     PT LONG TERM GOAL #2   Title increase left shoulder internal rotation so he can wash his back independently   Time 8   Period Weeks   Status On-going     PT LONG TERM GOAL #3   Title increased left shoulder flexion strength >/= 4/5 so he can hammer stakes in ground to plant vegetables   Time 8   Period Weeks   Status On-going     PT LONG TERM GOAL #4   Title reach overhead with left arm to get an item in overhead cabinet due to AROM flexion >/= 130 degrees   Time 8   Period Weeks   Status On-going     PT LONG TERM GOAL #5   Title FOTO score is </= 33% limitation    Time 8   Period Weeks   Status On-going     PT LONG TERM GOAL #6   Title pain with left shoulder during daily activities decreased >/=75%   Time 5   Period Weeks   Status On-going               Plan - 05/12/16 1047    Clinical Impression Statement Pt has grinding and clunking in shoulder with all A/ROM. Able to tolerate isometrics well. Reported some pain after release with manual isometrics exercises but not pain with active contraction. Pt will  continue to benefit from skilled therpy for shoulder strength and stability.    Rehab Potential Excellent   Clinical Impairments Affecting Rehab Potential None   PT Frequency 3x /  week   PT Duration 8 weeks   PT Treatment/Interventions Electrical Stimulation;Iontophoresis 69m/ml Dexamethasone;Moist Heat;Ultrasound;Patient/family education;Neuromuscular re-education;Therapeutic exercise;Therapeutic activities;Manual techniques;Passive range of motion;Taping;Dry needling   PT Next Visit Plan Asses responce to ionto #1, isometrics shoulder strengthening   Consulted and Agree with Plan of Care Patient      Patient will benefit from skilled therapeutic intervention in order to improve the following deficits and impairments:  Pain, Decreased mobility, Decreased strength, Impaired flexibility, Decreased activity tolerance, Decreased endurance, Increased muscle spasms, Impaired UE functional use, Decreased range of motion  Visit Diagnosis: Stiffness of left shoulder, not elsewhere classified  Muscle weakness (generalized)  Chronic left shoulder pain     Problem List Patient Active Problem List   Diagnosis Date Noted  . Elevated LFTs 08/26/2014  . S/P AVR (aortic valve replacement) 11/22/2013  . Aortic stenosis, severe 11/19/2013  . Aortic stenosis 09/03/2013  . Hyperlipidemia 09/03/2013  . Weight loss 09/03/2013  . Coronary artery disease 09/03/2013  . Hepatitis C 05/29/2013  . Unspecified gastritis and gastroduodenitis without mention of hemorrhage 04/24/2013  . Iron deficiency anemia due to chronic blood loss 04/23/2013  . Hypertension   . [place]   . Shortness of breath 04/22/2013    KMikle BosworthPTA 05/12/2016, 11:02 AM  [facility] [address] [place] [address]: [phone]  Fax:  [phone] Name: Daniel THETFORDMRN: [id number] of Birth:  810-Mar-1942

## 2016-05-16 ENCOUNTER — Ambulatory Visit: Payer: Medicare Other | Admitting: Physical Therapy

## 2016-05-16 ENCOUNTER — Ambulatory Visit (INDEPENDENT_AMBULATORY_CARE_PROVIDER_SITE_OTHER): Payer: Medicare Other | Admitting: Podiatry

## 2016-05-16 ENCOUNTER — Encounter: Payer: Self-pay | Admitting: Physical Therapy

## 2016-05-16 ENCOUNTER — Encounter: Payer: Self-pay | Admitting: Podiatry

## 2016-05-16 DIAGNOSIS — M6281 Muscle weakness (generalized): Secondary | ICD-10-CM

## 2016-05-16 DIAGNOSIS — Q828 Other specified congenital malformations of skin: Secondary | ICD-10-CM

## 2016-05-16 DIAGNOSIS — G8929 Other chronic pain: Secondary | ICD-10-CM

## 2016-05-16 DIAGNOSIS — M25612 Stiffness of left shoulder, not elsewhere classified: Secondary | ICD-10-CM | POA: Diagnosis not present

## 2016-05-16 DIAGNOSIS — M2042 Other hammer toe(s) (acquired), left foot: Secondary | ICD-10-CM | POA: Diagnosis not present

## 2016-05-16 DIAGNOSIS — E1151 Type 2 diabetes mellitus with diabetic peripheral angiopathy without gangrene: Secondary | ICD-10-CM

## 2016-05-16 DIAGNOSIS — M25512 Pain in left shoulder: Secondary | ICD-10-CM

## 2016-05-16 NOTE — Progress Notes (Signed)
Subjective:     Patient ID: [name], male   DOB: 02/05/1941, 76 y.o.   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

## 2016-05-16 NOTE — Therapy (Signed)
[facility] [address] [place], [place], [address] Phone: [phone]   Fax:  [phone]  Physical Therapy Treatment  Patient Details  Name: [name] MRN: [id number] Date of Birth: Mar 30, 1941 Referring Provider: Dr. Lujean Amel  Encounter Date: 05/16/2016      PT End of Session - 05/16/16 1102    Visit Number 7   Number of Visits 10   Date for PT Re-Evaluation 06/16/16   Authorization Type medicare g-code 10th visit; KX on 15th visit   PT Start Time 1100   PT Stop Time 1147   PT Time Calculation (min) 47 min   Activity Tolerance Patient tolerated treatment well   Behavior During Therapy [facility] for tasks assessed/performed      Past Medical History:  Diagnosis Date  . Acute epididymo-orchitis   . Aortic stenosis    s/p bioprosthetic AVR (Dr. Cyndia Bent) 11/2013  . Arthritis   . Diabetes mellitus without complication ([place])   . ED (erectile dysfunction)   . Essential hypertension, benign   . Heart murmur   . Hep C w/o coma, chronic ([place])   . History of blood transfusion   . History of DVT of lower extremity   . Hx of echocardiogram    Echo (8/15):  Severe LVH, EF 60-65%, no RWMA, Gr 3 DD, AVR ok (mean 21 mmHg), MAC, mild LAE, mild to mod RAE  . Hypercholesterolemia     Past Surgical History:  Procedure Laterality Date  . AORTIC VALVE REPLACEMENT N/A 11/19/2013   Procedure: AORTIC VALVE REPLACEMENT (AVR);  Surgeon: Gaye Pollack, MD;  Location: [place];  Service: Open Heart Surgery;  Laterality: N/A;  . BUNIONECTOMY Bilateral 2008  . CARDIAC CATHETERIZATION  10/01/13  . COLONOSCOPY  01/2010   internal hemorrhoids.  Dr Deatra Ina  . ESOPHAGOGASTRODUODENOSCOPY N/A 04/24/2013   Procedure: ESOPHAGOGASTRODUODENOSCOPY (EGD);  Surgeon: Jerene Bears, MD;  Location: [facility] ENDOSCOPY;  Service: Endoscopy;  Laterality: N/A;  . INGUINAL HERNIA REPAIR Right   . INTRAOPERATIVE TRANSESOPHAGEAL ECHOCARDIOGRAM N/A 11/19/2013   Procedure: INTRAOPERATIVE TRANSESOPHAGEAL ECHOCARDIOGRAM;  Surgeon: Gaye Pollack, MD;  Location: [facility] OR;  Service: Open Heart Surgery;  Laterality: N/A;  . KNEE ARTHROSCOPY Left 05/2009   Dr Collier Salina  . LAMINECTOMY  06/2005   Dr Joya Salm. multiple lumbar level laminectomies.   Marland Kitchen LEFT AND RIGHT HEART CATHETERIZATION WITH CORONARY ANGIOGRAM N/A 10/01/2013   Procedure: LEFT AND RIGHT HEART CATHETERIZATION WITH CORONARY ANGIOGRAM;  Surgeon: Candee Furbish, MD;  Location: [facility] CATH LAB;  Service: Cardiovascular;  Laterality: N/A;  . TEE WITHOUT CARDIOVERSION  11/2003   Aoritic valve sclerosis.   . TONSILLECTOMY      There were no vitals filed for this visit.      Subjective Assessment - 05/16/16 1100    Subjective Pt reports increased shoulder pain after having to work at [employer] over the weekend.    Patient Stated Goals improve left shoulder ROM and decrease pain   Currently in Pain? Yes   Pain Score 5    Pain Location Shoulder   Pain Orientation Left   Pain Descriptors / Indicators Shooting;Sharp   Pain Type Chronic pain   Pain Onset More than a month ago   Pain Frequency Intermittent                         [facility] Adult PT Treatment/Exercise - 05/16/16 0001      Shoulder Exercises: Supine  External Rotation Strengthening;Both;20 reps;Theraband   Theraband Level (Shoulder External Rotation) Level 2 (Red)   Flexion Strengthening;Left;20 reps  no resistance, minimal pain   Other Supine Exercises biceps and triceps  red band 2x10 each   Other Supine Exercises Manual isometrics  Arm extended resistance at forarm     Shoulder Exercises: Standing   Other Standing Exercises 6 way shoulder isomatrics     Shoulder Exercises: ROM/Strengthening   UBE (Upper Arm Bike) L1 2x2  Therapist present to discuss treatment     Modalities   Modalities Iontophoresis;Electrical Stimulation;Moist Heat     Moist Heat Therapy   Number Minutes Moist Heat 15 Minutes   Moist Heat Location  Shoulder     Electrical Stimulation   Electrical Stimulation Location Lt shoulder   Electrical Stimulation Action IFC   Electrical Stimulation Parameters 20 ma   Electrical Stimulation Goals Pain     Iontophoresis   Type of Iontophoresis Dexamethasone  #2   Location Lt shoulder posterior   Dose 1.0 mL   Time 6 hour patch                  PT Short Term Goals - 05/16/16 1102      PT SHORT TERM GOAL #2   Title left shoulder pain decreased >/= 25% with daily activities.    Time 4   Period Weeks   Status On-going     PT SHORT TERM GOAL #3   Title ability to reach behind his back with >/= 25% greater ease due to increase left shoulder ROM   Time 4   Period Weeks   Status On-going     PT SHORT TERM GOAL #4   Title left shoulder strength >/= 3/5 making daily activities easier   Time 4   Period Weeks   Status On-going           PT Long Term Goals - 05/16/16 1103      PT LONG TERM GOAL #1   Title independent with HEP   Time 8   Period Weeks   Status On-going     PT LONG TERM GOAL #2   Title increase left shoulder internal rotation so he can wash his back independently   Time 8   Period Weeks   Status On-going     PT LONG TERM GOAL #3   Title increased left shoulder flexion strength >/= 4/5 so he can hammer stakes in ground to plant vegetables   Time 8   Period Weeks   Status On-going               Plan - 05/16/16 1234    Clinical Impression Statement Pt had some increased pain over the weekend. Responded well to estim and heat. Able to tolerate all isometric exercises well and supine exercises with minmal pain. Will continue to increase scapular stability and shoulder strength as toelrated.    Rehab Potential Excellent   Clinical Impairments Affecting Rehab Potential None   PT Frequency 3x / week   PT Duration 8 weeks   PT Treatment/Interventions Electrical Stimulation;Iontophoresis 37m/ml Dexamethasone;Moist Heat;Ultrasound;Patient/family  education;Neuromuscular re-education;Therapeutic exercise;Therapeutic activities;Manual techniques;Passive range of motion;Taping;Dry needling   PT Next Visit Plan Ionot #3, supine strengthening, isometric strengthening   Consulted and Agree with Plan of Care Patient      Patient will benefit from skilled therapeutic intervention in order to improve the following deficits and impairments:  Pain, Decreased mobility, Decreased strength, Impaired flexibility, Decreased activity tolerance, Decreased  endurance, Increased muscle spasms, Impaired UE functional use, Decreased range of motion  Visit Diagnosis: Stiffness of left shoulder, not elsewhere classified  Muscle weakness (generalized)  Chronic left shoulder pain     Problem List Patient Active Problem List   Diagnosis Date Noted  . Elevated LFTs 08/26/2014  . S/P AVR (aortic valve replacement) 11/22/2013  . Aortic stenosis, severe 11/19/2013  . Aortic stenosis 09/03/2013  . Hyperlipidemia 09/03/2013  . Weight loss 09/03/2013  . Coronary artery disease 09/03/2013  . Hepatitis C 05/29/2013  . Unspecified gastritis and gastroduodenitis without mention of hemorrhage 04/24/2013  . Iron deficiency anemia due to chronic blood loss 04/23/2013  . Hypertension   . Diabetes mellitus without complication ([place])   . Shortness of breath 04/22/2013    Mikle Bosworth PTA 05/16/2016, 1:19 PM  [facility] [address], [place], [place], [address] Phone: [phone]   Fax:  [phone]  Name: [name] MRN: [id number] Date of Birth: 06-22-1940

## 2016-05-18 ENCOUNTER — Ambulatory Visit: Payer: Medicare Other | Admitting: Physical Therapy

## 2016-05-18 ENCOUNTER — Encounter: Payer: Self-pay | Admitting: Physical Therapy

## 2016-05-18 DIAGNOSIS — M6281 Muscle weakness (generalized): Secondary | ICD-10-CM

## 2016-05-18 DIAGNOSIS — M25512 Pain in left shoulder: Secondary | ICD-10-CM

## 2016-05-18 DIAGNOSIS — M25612 Stiffness of left shoulder, not elsewhere classified: Secondary | ICD-10-CM

## 2016-05-18 DIAGNOSIS — G8929 Other chronic pain: Secondary | ICD-10-CM

## 2016-05-18 NOTE — Therapy (Signed)
[facility] [address], [place], [place], [address] Phone: [phone]   Fax:  [phone]  Physical Therapy Treatment  Patient Details  Name: [name] MRN: [id number] Date of Birth: 07/06/1940 Referring Provider: Dr. Lujean Amel  Encounter Date: 05/18/2016      PT End of Session - 05/18/16 1014    Visit Number 8   Number of Visits 10   Date for PT Re-Evaluation 06/16/16   Authorization Type medicare g-code 10th visit; KX on 15th visit   PT Start Time 1015   PT Stop Time 1105   PT Time Calculation (min) 50 min   Activity Tolerance Patient tolerated treatment well   Behavior During Therapy [facility] for tasks assessed/performed      Past Medical History:  Diagnosis Date  . Acute epididymo-orchitis   . Aortic stenosis    s/p bioprosthetic AVR (Dr. Cyndia Bent) 11/2013  . Arthritis   . Diabetes mellitus without complication ([place])   . ED (erectile dysfunction)   . Essential hypertension, benign   . Heart murmur   . Hep C w/o coma, chronic ([place])   . History of blood transfusion   . History of DVT of lower extremity   . Hx of echocardiogram    Echo (8/15):  Severe LVH, EF 60-65%, no RWMA, Gr 3 DD, AVR ok (mean 21 mmHg), MAC, mild LAE, mild to mod RAE  . Hypercholesterolemia     Past Surgical History:  Procedure Laterality Date  . AORTIC VALVE REPLACEMENT N/A 11/19/2013   Procedure: AORTIC VALVE REPLACEMENT (AVR);  Surgeon: Gaye Pollack, MD;  Location: [place];  Service: Open Heart Surgery;  Laterality: N/A;  . BUNIONECTOMY Bilateral 2008  . CARDIAC CATHETERIZATION  10/01/13  . COLONOSCOPY  01/2010   internal hemorrhoids.  Dr Deatra Ina  . ESOPHAGOGASTRODUODENOSCOPY N/A 04/24/2013   Procedure: ESOPHAGOGASTRODUODENOSCOPY (EGD);  Surgeon: Jerene Bears, MD;  Location: [facility] ENDOSCOPY;  Service: Endoscopy;  Laterality: N/A;  . INGUINAL HERNIA REPAIR Right   . INTRAOPERATIVE TRANSESOPHAGEAL ECHOCARDIOGRAM N/A 11/19/2013    Procedure: INTRAOPERATIVE TRANSESOPHAGEAL ECHOCARDIOGRAM;  Surgeon: Gaye Pollack, MD;  Location: [facility] OR;  Service: Open Heart Surgery;  Laterality: N/A;  . KNEE ARTHROSCOPY Left 05/2009   Dr Collier Salina  . LAMINECTOMY  06/2005   Dr Joya Salm. multiple lumbar level laminectomies.   Marland Kitchen LEFT AND RIGHT HEART CATHETERIZATION WITH CORONARY ANGIOGRAM N/A 10/01/2013   Procedure: LEFT AND RIGHT HEART CATHETERIZATION WITH CORONARY ANGIOGRAM;  Surgeon: Candee Furbish, MD;  Location: [facility] CATH LAB;  Service: Cardiovascular;  Laterality: N/A;  . TEE WITHOUT CARDIOVERSION  11/2003   Aoritic valve sclerosis.   . TONSILLECTOMY      There were no vitals filed for this visit.      Subjective Assessment - 05/18/16 1019    Subjective Pt states he didn't have any pain this morning.  states he has been doing isometric exercises at home and not having pain with that.   Currently in Pain? No/denies                         [facility] Adult PT Treatment/Exercise - 05/18/16 0001      Shoulder Exercises: Supine   Other Supine Exercises Manual isometrics  Arm extended resistance at forarm     Shoulder Exercises: Standing   External Rotation Strengthening;Left;Theraband  Isometric side stepping   Theraband Level (Shoulder External Rotation) Level 2 (Red)   Extension Strengthening;Left;Theraband  3x10  Theraband Level (Shoulder Extension) Level 2 (Red)   Row Strengthening;Both;Theraband  3x10   Theraband Level (Shoulder Row) Level 2 (Red)     Shoulder Exercises: ROM/Strengthening   UBE (Upper Arm Bike) L1 2x2  Therapist present to discuss treatment   Wall Wash small ROM up and down, side to side - 20x each   Other ROM/Strengthening Exercises finger ladder 10x to comfortable ROM   Other ROM/Strengthening Exercises I, Y, T in standing 10x each   cues to limit ROM and not use Upper trap     Modalities   Modalities Iontophoresis;Electrical Stimulation;Moist Heat     Moist Heat Therapy   Number Minutes  Moist Heat 11 Minutes   Moist Heat Location Shoulder     Electrical Stimulation   Electrical Stimulation Location Lt shoulder   Electrical Stimulation Action IFC   Electrical Stimulation Parameters to tolerance   Electrical Stimulation Goals Pain     Iontophoresis   Type of Iontophoresis Dexamethasone  #2   Location Lt shoulder posterior   Dose 1.0 mL   Time 6 hour patch                PT Education - 05/18/16 1101    Education provided Yes   Education Details band rows, extension, internal and external rotation   Person(s) Educated Patient   Methods Explanation;Demonstration;Verbal cues;Handout   Comprehension Verbalized understanding;Returned demonstration          PT Short Term Goals - 05/18/16 1020      PT SHORT TERM GOAL #1   Title independent with initial HEP   Time 4   Period Weeks   Status Achieved     PT SHORT TERM GOAL #2   Title left shoulder pain decreased >/= 25% with daily activities.    Baseline 25% less   Time 4   Period Weeks   Status Achieved           PT Long Term Goals - 05/16/16 1103      PT LONG TERM GOAL #1   Title independent with HEP   Time 8   Period Weeks   Status On-going     PT LONG TERM GOAL #2   Title increase left shoulder internal rotation so he can wash his back independently   Time 8   Period Weeks   Status On-going     PT LONG TERM GOAL #3   Title increased left shoulder flexion strength >/= 4/5 so he can hammer stakes in ground to plant vegetables   Time 8   Period Weeks   Status On-going               Plan - 05/18/16 1107    Clinical Impression Statement Able to perform band exercises correctly and pt was given band with handout to perform at home.  some difficulty with AROM doing I, Y, and T in standing, needs tactile and VC to perform correctly without using upper trap.  Should strengthening and stability exercises for improved RTC muscle activation during functional movments needed to avoid  injruy.   Rehab Potential Excellent   Clinical Impairments Affecting Rehab Potential None   PT Duration 8 weeks   PT Treatment/Interventions Electrical Stimulation;Iontophoresis 62m/ml Dexamethasone;Moist Heat;Ultrasound;Patient/family education;Neuromuscular re-education;Therapeutic exercise;Therapeutic activities;Manual techniques;Passive range of motion;Taping;Dry needling   PT Next Visit Plan Ionot #4, supine strengthening, isometric strengthening, f/u with band exercises   Consulted and Agree with Plan of Care Patient      Patient will benefit  from skilled therapeutic intervention in order to improve the following deficits and impairments:  Pain, Decreased mobility, Decreased strength, Impaired flexibility, Decreased activity tolerance, Decreased endurance, Increased muscle spasms, Impaired UE functional use, Decreased range of motion  Visit Diagnosis: Stiffness of left shoulder, not elsewhere classified  Muscle weakness (generalized)  Chronic left shoulder pain     Problem List Patient Active Problem List   Diagnosis Date Noted  . Elevated LFTs 08/26/2014  . S/P AVR (aortic valve replacement) 11/22/2013  . Aortic stenosis, severe 11/19/2013  . Aortic stenosis 09/03/2013  . Hyperlipidemia 09/03/2013  . Weight loss 09/03/2013  . Coronary artery disease 09/03/2013  . Hepatitis C 05/29/2013  . Unspecified gastritis and gastroduodenitis without mention of hemorrhage 04/24/2013  . Iron deficiency anemia due to chronic blood loss 04/23/2013  . Hypertension   . Diabetes mellitus without complication ([place])   . Shortness of breath 04/22/2013    Zannie Cove, PT 05/18/2016, 11:12 AM  [facility] [address] [place], [place], [address] Phone: [phone]   Fax:  [phone]  Name: [name] MRN: [id number] Date of Birth: 1941-04-18

## 2016-05-18 NOTE — Patient Instructions (Signed)
(  Clinic) External Rotation: With Abduction - Sitting    Opposite side toward pulley, left arm supported, elbow out, bent to 90, forearm across body. Rotate shoulder by pulling hand away from body. Keep elbow bent to 90. Repeat __20__ times per set. Do __1__ sets per session. Use band Copyright  VHI. All rights reserved.  (Home) Internal Rotation: Resting Position    Right side toward anchor, squeeze roller to same side, elbow bent to 90, forearm out. Rotate shoulder by pulling hand toward body. Keep elbow bent to 90. Repeat __20__ times per set. Do __1__ sets per session. Use __band__ lb weights.  Copyright  VHI. All rights reserved.  (Clinic) Retraction: Row - Bilateral (Pulley)    Facing pulley, arms reaching forward, pull hands toward stomach, pinching shoulder blades together. Repeat __20__ times per set. Do _1___ sets per session.  Copyright  VHI. All rights reserved.  EXTENSION: Standing - Resistance Band: Stable (Active)    Stand, right arm at side. Against yellow resistance band, draw arm backward, as far as possible, keeping elbow straight. Complete __1_ sets of _20__ repetitions. Perform 1___ sessions per day.  Copyright  VHI. All rights reserved.

## 2016-05-20 ENCOUNTER — Encounter: Payer: Self-pay | Admitting: Physical Therapy

## 2016-05-20 ENCOUNTER — Ambulatory Visit: Payer: Medicare Other | Admitting: Physical Therapy

## 2016-05-20 DIAGNOSIS — G8929 Other chronic pain: Secondary | ICD-10-CM

## 2016-05-20 DIAGNOSIS — M25512 Pain in left shoulder: Secondary | ICD-10-CM | POA: Diagnosis not present

## 2016-05-20 DIAGNOSIS — M6281 Muscle weakness (generalized): Secondary | ICD-10-CM | POA: Diagnosis not present

## 2016-05-20 DIAGNOSIS — M25612 Stiffness of left shoulder, not elsewhere classified: Secondary | ICD-10-CM

## 2016-05-20 NOTE — Patient Instructions (Signed)

## 2016-05-20 NOTE — Therapy (Signed)
[facility] [address], [place], [place], [address] Phone: [phone]   Fax:  [phone]  Physical Therapy Treatment  Patient Details  Name: [name] MRN: [id number] Date of Birth: 1940/07/09 Referring Provider: Dr. Lujean Amel  Encounter Date: 05/20/2016      PT End of Session - 05/20/16 1016    Visit Number 9   Number of Visits 10   Date for PT Re-Evaluation 06/16/16   Authorization Type medicare g-code 10th visit; KX on 15th visit   PT Start Time 1015   PT Stop Time 1054   PT Time Calculation (min) 39 min   Activity Tolerance Patient tolerated treatment well   Behavior During Therapy [facility] for tasks assessed/performed      Past Medical History:  Diagnosis Date  . Acute epididymo-orchitis   . Aortic stenosis    s/p bioprosthetic AVR (Dr. Cyndia Bent) 11/2013  . Arthritis   . Diabetes mellitus without complication ([place])   . ED (erectile dysfunction)   . Essential hypertension, benign   . Heart murmur   . Hep C w/o coma, chronic ([place])   . History of blood transfusion   . History of DVT of lower extremity   . Hx of echocardiogram    Echo (8/15):  Severe LVH, EF 60-65%, no RWMA, Gr 3 DD, AVR ok (mean 21 mmHg), MAC, mild LAE, mild to mod RAE  . Hypercholesterolemia     Past Surgical History:  Procedure Laterality Date  . AORTIC VALVE REPLACEMENT N/A 11/19/2013   Procedure: AORTIC VALVE REPLACEMENT (AVR);  Surgeon: Gaye Pollack, MD;  Location: [place];  Service: Open Heart Surgery;  Laterality: N/A;  . BUNIONECTOMY Bilateral 2008  . CARDIAC CATHETERIZATION  10/01/13  . COLONOSCOPY  01/2010   internal hemorrhoids.  Dr Deatra Ina  . ESOPHAGOGASTRODUODENOSCOPY N/A 04/24/2013   Procedure: ESOPHAGOGASTRODUODENOSCOPY (EGD);  Surgeon: Jerene Bears, MD;  Location: [facility] ENDOSCOPY;  Service: Endoscopy;  Laterality: N/A;  . INGUINAL HERNIA REPAIR Right   . INTRAOPERATIVE TRANSESOPHAGEAL ECHOCARDIOGRAM N/A 11/19/2013   Procedure: INTRAOPERATIVE TRANSESOPHAGEAL ECHOCARDIOGRAM;  Surgeon: Gaye Pollack, MD;  Location: [facility] OR;  Service: Open Heart Surgery;  Laterality: N/A;  . KNEE ARTHROSCOPY Left 05/2009   Dr Collier Salina  . LAMINECTOMY  06/2005   Dr Joya Salm. multiple lumbar level laminectomies.   Marland Kitchen LEFT AND RIGHT HEART CATHETERIZATION WITH CORONARY ANGIOGRAM N/A 10/01/2013   Procedure: LEFT AND RIGHT HEART CATHETERIZATION WITH CORONARY ANGIOGRAM;  Surgeon: Candee Furbish, MD;  Location: [facility] CATH LAB;  Service: Cardiovascular;  Laterality: N/A;  . TEE WITHOUT CARDIOVERSION  11/2003   Aoritic valve sclerosis.   . TONSILLECTOMY      There were no vitals filed for this visit.      Subjective Assessment - 05/20/16 1016    Subjective Pt reports no pain today in the shoulder.    Patient Stated Goals improve left shoulder ROM and decrease pain   Currently in Pain? No/denies                         [facility] Adult PT Treatment/Exercise - 05/20/16 0001      Shoulder Exercises: Prone   Extension Strengthening;Left;15 reps   Horizontal ABduction 1 Strengthening;Left;5 reps     Shoulder Exercises: Standing   Extension Strengthening;Left;Weights  3x10 #20   Row Strengthening;Both;Theraband  3x10 #20   Retraction Strengthening;Both   Theraband Level (Shoulder Retraction) Level 2 (Red)   Other Standing Exercises  D1 extension Red tband  verbal and tactile cues for form     Shoulder Exercises: ROM/Strengthening   UBE (Upper Arm Bike) L1 2x2  Therapist present to discuss treatment     Modalities   Modalities Iontophoresis     Iontophoresis   Type of Iontophoresis Dexamethasone  #4   Location Lt shoulder posterior   Dose 1.0 mL   Time 6 hour patch     Manual Therapy   Manual Therapy Soft tissue mobilization   Manual therapy comments Pt seated   Soft tissue mobilization Lt upper trap IASTM and massage                PT Education - 05/20/16 1057    Education provided Yes   Education  Details [place]   Person(s) Educated Patient   Methods Explanation;Handout   Comprehension Verbalized understanding          PT Short Term Goals - 05/18/16 1020      PT SHORT TERM GOAL #1   Title independent with initial HEP   Time 4   Period Weeks   Status Achieved     PT SHORT TERM GOAL #2   Title left shoulder pain decreased >/= 25% with daily activities.    Baseline 25% less   Time 4   Period Weeks   Status Achieved           PT Long Term Goals - 05/16/16 1103      PT LONG TERM GOAL #1   Title independent with HEP   Time 8   Period Weeks   Status On-going     PT LONG TERM GOAL #2   Title increase left shoulder internal rotation so he can wash his back independently   Time 8   Period Weeks   Status On-going     PT LONG TERM GOAL #3   Title increased left shoulder flexion strength >/= 4/5 so he can hammer stakes in ground to plant vegetables   Time 8   Period Weeks   Status On-going               Plan - 05/20/16 1030    Clinical Impression Statement Pt progressing well with shoulder strength and scapular stability. Pt continues to have over active upper trap on Lt. Pt will continue to benefit from skilled therapy for shoulder stability and motor control.    Rehab Potential Excellent   Clinical Impairments Affecting Rehab Potential None   PT Frequency 3x / week   PT Duration 8 weeks   PT Treatment/Interventions Electrical Stimulation;Iontophoresis 27m/ml Dexamethasone;Moist Heat;Ultrasound;Patient/family education;Neuromuscular re-education;Therapeutic exercise;Therapeutic activities;Manual techniques;Passive range of motion;Taping;Dry needling   PT Next Visit Plan Iono#5, middle and lower trap strenghtening   Consulted and Agree with Plan of Care Patient      Patient will benefit from skilled therapeutic intervention in order to improve the following deficits and impairments:  Pain, Decreased mobility, Decreased strength, Impaired flexibility,  Decreased activity tolerance, Decreased endurance, Increased muscle spasms, Impaired UE functional use, Decreased range of motion  Visit Diagnosis: Stiffness of left shoulder, not elsewhere classified  Muscle weakness (generalized)  Chronic left shoulder pain     Problem List Patient Active Problem List   Diagnosis Date Noted  . Elevated LFTs 08/26/2014  . S/P AVR (aortic valve replacement) 11/22/2013  . Aortic stenosis, severe 11/19/2013  . Aortic stenosis 09/03/2013  . Hyperlipidemia 09/03/2013  . Weight loss 09/03/2013  . Coronary artery disease 09/03/2013  . Hepatitis  C 05/29/2013  . Unspecified gastritis and gastroduodenitis without mention of hemorrhage 04/24/2013  . Iron deficiency anemia due to chronic blood loss 04/23/2013  . Hypertension   . Diabetes mellitus without complication ([place])   . Shortness of breath 04/22/2013    Mikle Bosworth PTA 05/20/2016, 10:58 AM  [facility] [address] [place], [place], [address] Phone: [phone]   Fax:  [phone]  Name: [name] MRN: [id number] Date of Birth: 1941/03/17

## 2016-05-23 ENCOUNTER — Ambulatory Visit: Payer: Medicare Other | Admitting: Physical Therapy

## 2016-05-23 DIAGNOSIS — M25512 Pain in left shoulder: Secondary | ICD-10-CM

## 2016-05-23 DIAGNOSIS — G8929 Other chronic pain: Secondary | ICD-10-CM | POA: Diagnosis not present

## 2016-05-23 DIAGNOSIS — M25612 Stiffness of left shoulder, not elsewhere classified: Secondary | ICD-10-CM | POA: Diagnosis not present

## 2016-05-23 DIAGNOSIS — M6281 Muscle weakness (generalized): Secondary | ICD-10-CM

## 2016-05-23 NOTE — Therapy (Signed)
[facility] [address], [place], [place], [address] Phone: [phone]   Fax:  [phone]  Physical Therapy Treatment  Patient Details  Name: [name] MRN: [id number] Date of Birth: [birth year] Referring Provider: Dr. Lujean Amel  Encounter Date: 05/23/2016      PT End of Session - 05/23/16 1014    Visit Number 10   Number of Visits 20   Date for PT Re-Evaluation 06/16/16   Authorization Type medicare g-code 10th visit; KX on 15th visit   PT Start Time 1017   PT Stop Time 1102   PT Time Calculation (min) 45 min   Activity Tolerance Patient tolerated treatment well   Behavior During Therapy [facility] for tasks assessed/performed      Past Medical History:  Diagnosis Date  . Acute epididymo-orchitis   . Aortic stenosis    s/p bioprosthetic AVR (Dr. Cyndia Bent) 11/2013  . Arthritis   . Diabetes mellitus without complication ([place])   . ED (erectile dysfunction)   . Essential hypertension, benign   . Heart murmur   . 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 History of blood transfusion   . History of DVT of lower extremity   . Hx of echocardiogram    Echo (8/15):  Severe LVH, EF 60-65%, no RWMA, Gr 3 DD, AVR ok (mean 21 mmHg), MAC, mild LAE, mild to mod RAE  . Hypercholesterolemia     Past Surgical History:  Procedure Laterality Date  . AORTIC VALVE REPLACEMENT N/A 11/19/2013   Procedure: AORTIC VALVE REPLACEMENT (AVR);  Surgeon: Gaye Pollack, MD;  Location: [place];  Service: Open Heart Surgery;  Laterality: N/A;  . BUNIONECTOMY Bilateral 2008  . CARDIAC CATHETERIZATION  10/01/13  . COLONOSCOPY  01/2010   internal hemorrhoids.  Dr Deatra Ina  . ESOPHAGOGASTRODUODENOSCOPY N/A 04/24/2013   Procedure: ESOPHAGOGASTRODUODENOSCOPY (EGD);  Surgeon: Jerene Bears, MD;  Location: [facility] ENDOSCOPY;  Service: Endoscopy;  Laterality: N/A;  . INGUINAL HERNIA REPAIR Right   . INTRAOPERATIVE TRANSESOPHAGEAL ECHOCARDIOGRAM N/A 11/19/2013    Procedure: INTRAOPERATIVE TRANSESOPHAGEAL ECHOCARDIOGRAM;  Surgeon: Gaye Pollack, MD;  Location: [facility] OR;  Service: Open Heart Surgery;  Laterality: N/A;  . KNEE ARTHROSCOPY Left 05/2009   Dr Collier Salina  . LAMINECTOMY  06/2005   Dr Joya Salm. multiple lumbar level laminectomies.   Marland Kitchen LEFT AND RIGHT HEART CATHETERIZATION WITH CORONARY ANGIOGRAM N/A 10/01/2013   Procedure: LEFT AND RIGHT HEART CATHETERIZATION WITH CORONARY ANGIOGRAM;  Surgeon: Candee Furbish, MD;  Location: [facility] CATH LAB;  Service: Cardiovascular;  Laterality: N/A;  . TEE WITHOUT CARDIOVERSION  11/2003   Aoritic valve sclerosis.   . TONSILLECTOMY      There were no vitals filed for this visit.      Subjective Assessment - 05/23/16 1054    Subjective Pt reports no pain today in the shoulder. Reports improved symptoms overall.  States he had a harder time doing band exercises yesterday and was doing some work at [employer] yesterday that increased his pain.     Patient Stated Goals improve left shoulder ROM and decrease pain   Currently in Pain? No/denies            [facility] PT Assessment - 05/23/16 0001      Observation/Other Assessments   Focus on Therapeutic Outcomes (FOTO)  49% limitation     AROM   Right/Left Shoulder Left   Left Shoulder Extension 60 Degrees   Left Shoulder Flexion 85 Degrees   Left  Shoulder ABduction 65 Degrees   Left Shoulder Internal Rotation 45 Degrees  L1   Left Shoulder External Rotation 25 Degrees     PROM   Left Shoulder Flexion 120 Degrees   Left Shoulder ABduction 100 Degrees   Left Shoulder Internal Rotation 45 Degrees   Left Shoulder External Rotation 55 Degrees     Strength   Right/Left Shoulder Left   Left Shoulder Flexion 2/5   Left Shoulder Extension 4+/5   Left Shoulder ABduction 2/5   Left Shoulder Internal Rotation 3/5   Left Shoulder External Rotation 3/5                     [facility] Adult PT Treatment/Exercise - 05/23/16 0001      Shoulder Exercises: Supine    External Rotation --   Theraband Level (Shoulder External Rotation) --   Flexion Strengthening;Left;20 reps  no resistance, minimal pain   Other Supine Exercises red band - bicep, tricep, Internal and external rotation - PT performed purtubation each way     Shoulder Exercises: Standing   External Rotation Strengthening;Left;Theraband  Isometric side stepping   Theraband Level (Shoulder External Rotation) Level 2 (Red)   Extension Strengthening;Left;Weights  3x10   Theraband Level (Shoulder Extension) Level 2 (Red)   Row Strengthening;Both;Theraband  3x10   Theraband Level (Shoulder Row) Level 2 (Red)   Retraction Strengthening;Both   Theraband Level (Shoulder Retraction) Level 2 (Red)   Other Standing Exercises D1 extension Red tband  verbal and tactile cues for form     Shoulder Exercises: ROM/Strengthening   UBE (Upper Arm Bike) L2 2x2  Therapist present to discuss treatment                  PT Short Term Goals - 05/23/16 1044      PT SHORT TERM GOAL #1   Title independent with initial HEP   Time 4   Period Weeks   Status Achieved     PT SHORT TERM GOAL #2   Title left shoulder pain decreased >/= 25% with daily activities.    Baseline 25% less   Time 4   Period Weeks   Status Achieved     PT SHORT TERM GOAL #3   Title ability to reach behind his back with >/= 25% greater ease due to increase left shoulder ROM   Baseline just a little but not very noticeable   Time 4   Period Weeks   Status On-going     PT SHORT TERM GOAL #4   Title left shoulder strength >/= 3/5 making daily activities easier   Baseline some improvements but still 2/5 to 3/+5   Period Weeks   Status On-going           PT Long Term Goals - 05/23/16 1025      PT LONG TERM GOAL #1   Title independent with HEP   Time 8   Period Weeks   Status On-going     PT LONG TERM GOAL #2   Title increase left shoulder internal rotation so he can wash his back independently   Time 8    Period Weeks   Status On-going     PT LONG TERM GOAL #3   Title increased left shoulder flexion strength >/= 4/5 so he can hammer stakes in ground to plant vegetables   Time 8   Period Weeks   Status On-going     PT LONG TERM GOAL #4  Title reach overhead with left arm to get an item in overhead cabinet due to AROM flexion >/= 130 degrees   Time 8   Period Weeks   Status On-going     PT LONG TERM GOAL #5   Title FOTO score is </= 33% limitation    Baseline 49% limitation   Period Weeks   Status On-going     PT LONG TERM GOAL #6   Title pain with left shoulder during daily activities decreased >/=75%   Baseline currently 40% better   Time 5   Period Weeks   Status On-going               Plan - 06-03-2016 1057    Clinical Impression Statement Pt is progressing slowly.  Demonstrates moderate amount of instability during exericses and is still limited in shoulder flexion, abduction and internal rotation.  Pt continues to be determined and is doing his HEP.  Strength has increased to 3+/5 from 2-/5 in RTC in general.  Pt will benefit from coninued skilled PT in order to return to functional activities such as working in yard   [employer] Excellent   Clinical Impairments Affecting Rehab Potential None   PT Frequency 3x / week   PT Duration 8 weeks   PT Treatment/Interventions Electrical Stimulation;Iontophoresis 53m/ml Dexamethasone;Moist Heat;Ultrasound;Patient/family education;Neuromuscular re-education;Therapeutic exercise;Therapeutic activities;Manual techniques;Passive range of motion;Taping;Dry needling   PT Next Visit Plan Iono#5, middle and lower trap strenghtening   Consulted and Agree with Plan of Care Patient      Patient will benefit from skilled therapeutic intervention in order to improve the following deficits and impairments:  Pain, Decreased mobility, Decreased strength, Impaired flexibility, Decreased activity tolerance, Decreased endurance,  Increased muscle spasms, Impaired UE functional use, Decreased range of motion  Visit Diagnosis: Stiffness of left shoulder, not elsewhere classified  Muscle weakness (generalized)  Chronic left shoulder pain       G-Codes - 0Jan 26, [id number]    Functional Assessment Tool Used FOTO score is 49% limitation   Functional Limitation Other PT primary   Other PT Primary Current Status ((S9373 At least 40 percent but less than 60 percent impaired, limited or restricted   Other PT Primary Goal Status (([id number] At least 20 percent but less than 40 percent impaired, limited or restricted      Problem List Patient Active Problem List   Diagnosis Date Noted  . Elevated LFTs 08/26/2014  . S/P AVR (aortic valve replacement) 11/22/2013  . Aortic stenosis, severe 11/19/2013  . Aortic stenosis 09/03/2013  . Hyperlipidemia 09/03/2013  . Weight loss 09/03/2013  . Coronary artery disease 09/03/2013  . Hepatitis C 05/29/2013  . Unspecified gastritis and gastroduodenitis without mention of hemorrhage 04/24/2013  . Iron deficiency anemia due to chronic blood loss 04/23/2013  . Hypertension   . Diabetes mellitus without complication ([place]   . Shortness of breath 04/22/2013    JZannie Cove PT 101/26/2018 5:52 PM  [facility] [address] [place] [address]: [phone]  Fax:  [phone] Name: LDAMEER SPEISERMRN: [id number] of Birth: [birth year]

## 2016-05-25 ENCOUNTER — Encounter: Payer: Medicare Other | Admitting: Physical Therapy

## 2016-05-27 ENCOUNTER — Encounter: Payer: Self-pay | Admitting: Physical Therapy

## 2016-05-27 ENCOUNTER — Ambulatory Visit: Payer: Medicare Other | Admitting: Physical Therapy

## 2016-05-27 DIAGNOSIS — M6281 Muscle weakness (generalized): Secondary | ICD-10-CM

## 2016-05-27 DIAGNOSIS — G8929 Other chronic pain: Secondary | ICD-10-CM

## 2016-05-27 DIAGNOSIS — M25512 Pain in left shoulder: Secondary | ICD-10-CM | POA: Diagnosis not present

## 2016-05-27 DIAGNOSIS — M25612 Stiffness of left shoulder, not elsewhere classified: Secondary | ICD-10-CM

## 2016-05-27 NOTE — Therapy (Signed)
[facility] [address] [place], [place], [address] Phone: [phone]   Fax:  [phone]  Physical Therapy Treatment  Patient Details  Name: [name] MRN: [id number] Date of Birth: January 10, 1941 Referring Provider: Dr. Lujean Amel  Encounter Date: 05/27/2016      PT End of Session - 05/27/16 1015    Visit Number 11   Number of Visits 20   Date for PT Re-Evaluation 06/16/16   Authorization Type medicare g-code 20th visit   PT Start Time 1015   PT Stop Time 1100   PT Time Calculation (min) 45 min   Activity Tolerance Patient tolerated treatment well   Behavior During Therapy [facility] for tasks assessed/performed      Past Medical History:  Diagnosis Date  . Acute epididymo-orchitis   . Aortic stenosis    s/p bioprosthetic AVR (Dr. Cyndia Bent) 11/2013  . Arthritis   . Diabetes mellitus without complication ([place])   . ED (erectile dysfunction)   . Essential hypertension, benign   . Heart murmur   . Hep C w/o coma, chronic ([place])   . History of blood transfusion   . History of DVT of lower extremity   . Hx of echocardiogram    Echo (8/15):  Severe LVH, EF 60-65%, no RWMA, Gr 3 DD, AVR ok (mean 21 mmHg), MAC, mild LAE, mild to mod RAE  . Hypercholesterolemia     Past Surgical History:  Procedure Laterality Date  . AORTIC VALVE REPLACEMENT N/A 11/19/2013   Procedure: AORTIC VALVE REPLACEMENT (AVR);  Surgeon: Gaye Pollack, MD;  Location: [place];  Service: Open Heart Surgery;  Laterality: N/A;  . BUNIONECTOMY Bilateral 2008  . CARDIAC CATHETERIZATION  10/01/13  . COLONOSCOPY  01/2010   internal hemorrhoids.  Dr Deatra Ina  . ESOPHAGOGASTRODUODENOSCOPY N/A 04/24/2013   Procedure: ESOPHAGOGASTRODUODENOSCOPY (EGD);  Surgeon: Jerene Bears, MD;  Location: [facility] ENDOSCOPY;  Service: Endoscopy;  Laterality: N/A;  . INGUINAL HERNIA REPAIR Right   . INTRAOPERATIVE TRANSESOPHAGEAL ECHOCARDIOGRAM N/A 11/19/2013   Procedure:  INTRAOPERATIVE TRANSESOPHAGEAL ECHOCARDIOGRAM;  Surgeon: Gaye Pollack, MD;  Location: [facility] OR;  Service: Open Heart Surgery;  Laterality: N/A;  . KNEE ARTHROSCOPY Left 05/2009   Dr Collier Salina  . LAMINECTOMY  06/2005   Dr Joya Salm. multiple lumbar level laminectomies.   Marland Kitchen LEFT AND RIGHT HEART CATHETERIZATION WITH CORONARY ANGIOGRAM N/A 10/01/2013   Procedure: LEFT AND RIGHT HEART CATHETERIZATION WITH CORONARY ANGIOGRAM;  Surgeon: Candee Furbish, MD;  Location: [facility] CATH LAB;  Service: Cardiovascular;  Laterality: N/A;  . TEE WITHOUT CARDIOVERSION  11/2003   Aoritic valve sclerosis.   . TONSILLECTOMY      There were no vitals filed for this visit.      Subjective Assessment - 05/27/16 1019    Subjective Reports no pain today.  States he shoveled his driveway and is feeling okay so far.   Patient Stated Goals improve left shoulder ROM and decrease pain   Currently in Pain? No/denies                         [facility] Adult PT Treatment/Exercise - 05/27/16 0001      Shoulder Exercises: Sidelying   External Rotation Strengthening;Left;20 reps;Weights   External Rotation Weight (lbs) 2   Flexion Strengthening;Left;10 reps  PT supporting arm 30%   ABduction Strengthening;Left;10 reps  PT assist about 30-50%     Shoulder Exercises: Standing   External Rotation Strengthening;Left;Theraband  30  reps   Theraband Level (Shoulder External Rotation) Level 2 (Red)   Internal Rotation Strengthening;Left;20 reps;Theraband   Theraband Level (Shoulder Internal Rotation) Level 2 (Red)   Extension Strengthening;Left;Weights  3x10   Theraband Level (Shoulder Extension) Level 3 (Green)   Row Strengthening;Both;Theraband  3x10   Theraband Level (Shoulder Row) Level 3 (Green)   Other Standing Exercises D1 extension Red tband  verbal and tactile cues for form, 30% ROM   Other Standing Exercises serratus wall pushes 2 x 10; ticep pull downs green band - 30x     Shoulder Exercises:  ROM/Strengthening   UBE (Upper Arm Bike) L2 2x2  Therapist present to discuss treatment     Modalities   Modalities Iontophoresis     Iontophoresis   Type of Iontophoresis Dexamethasone  #5   Location Lt shoulder posterior   Dose 1.0 mL   Time 6 hour patch     Manual Therapy   Manual therapy comments Pt seated   Soft tissue mobilization Lt upper trap, levator, RTC                PT Education - 05/27/16 1103    Education provided Yes   Education Details serratus wall pushes added to HEP   Person(s) Educated Patient   Methods Explanation;Demonstration;Tactile cues;Verbal cues   Comprehension Verbalized understanding;Returned demonstration          PT Short Term Goals - 05/23/16 1044      PT SHORT TERM GOAL #1   Title independent with initial HEP   Time 4   Period Weeks   Status Achieved     PT SHORT TERM GOAL #2   Title left shoulder pain decreased >/= 25% with daily activities.    Baseline 25% less   Time 4   Period Weeks   Status Achieved     PT SHORT TERM GOAL #3   Title ability to reach behind his back with >/= 25% greater ease due to increase left shoulder ROM   Baseline just a little but not very noticeable   Time 4   Period Weeks   Status On-going     PT SHORT TERM GOAL #4   Title left shoulder strength >/= 3/5 making daily activities easier   Baseline some improvements but still 2/5 to 3/+5   Period Weeks   Status On-going           PT Long Term Goals - 05/23/16 1025      PT LONG TERM GOAL #1   Title independent with HEP   Time 8   Period Weeks   Status On-going     PT LONG TERM GOAL #2   Title increase left shoulder internal rotation so he can wash his back independently   Time 8   Period Weeks   Status On-going     PT LONG TERM GOAL #3   Title increased left shoulder flexion strength >/= 4/5 so he can hammer stakes in ground to plant vegetables   Time 8   Period Weeks   Status On-going     PT LONG TERM GOAL #4    Title reach overhead with left arm to get an item in overhead cabinet due to AROM flexion >/= 130 degrees   Time 8   Period Weeks   Status On-going     PT LONG TERM GOAL #5   Title FOTO score is </= 33% limitation    Baseline 49% limitation   Period Weeks  Status On-going     PT LONG TERM GOAL #6   Title pain with left shoulder during daily activities decreased >/=75%   Baseline currently 40% better   Time 5   Period Weeks   Status On-going               Plan - 05/27/16 1104    Clinical Impression Statement Pt was able to shovel snow and did not have increased pain.  Pt demonstrates ability to perform serratus wall push correctly and was added to HEP.  Pt continues to need strengthening in order to have stability in shoulder during functional movments.   Rehab Potential Excellent   Clinical Impairments Affecting Rehab Potential None   PT Frequency 3x / week   PT Duration 8 weeks   PT Treatment/Interventions Electrical Stimulation;Iontophoresis 41m/ml Dexamethasone;Moist Heat;Ultrasound;Patient/family education;Neuromuscular re-education;Therapeutic exercise;Therapeutic activities;Manual techniques;Passive range of motion;Taping;Dry needling   PT Next Visit Plan Iono#6, middle and lower trap strenghtening, progress serratus strengthening   Consulted and Agree with Plan of Care Patient      Patient will benefit from skilled therapeutic intervention in order to improve the following deficits and impairments:  Pain, Decreased mobility, Decreased strength, Impaired flexibility, Decreased activity tolerance, Decreased endurance, Increased muscle spasms, Impaired UE functional use, Decreased range of motion  Visit Diagnosis: Stiffness of left shoulder, not elsewhere classified  Muscle weakness (generalized)  Chronic left shoulder pain     Problem List Patient Active Problem List   Diagnosis Date Noted  . Elevated LFTs 08/26/2014  . S/P AVR (aortic valve replacement)  11/22/2013  . Aortic stenosis, severe 11/19/2013  . Aortic stenosis 09/03/2013  . Hyperlipidemia 09/03/2013  . Weight loss 09/03/2013  . Coronary artery disease 09/03/2013  . Hepatitis C 05/29/2013  . Unspecified gastritis and gastroduodenitis without mention of hemorrhage 04/24/2013  . Iron deficiency anemia due to chronic blood loss 04/23/2013  . Hypertension   . Diabetes mellitus without complication ([place] [facility]   . Shortness of breath 04/22/2013    JZannie Cove PT 05/27/2016, 11:10 AM  [facility] [address] [place] [address]Phone: [phone]  Fax:  [phone] Name: LROMY MCGUEMRN: [id number] of Birth: [birth year]

## 2016-05-30 ENCOUNTER — Encounter: Payer: Self-pay | Admitting: Physical Therapy

## 2016-05-30 ENCOUNTER — Ambulatory Visit: Payer: Medicare Other | Admitting: Physical Therapy

## 2016-05-30 DIAGNOSIS — M25612 Stiffness of left shoulder, not elsewhere classified: Secondary | ICD-10-CM

## 2016-05-30 DIAGNOSIS — M25512 Pain in left shoulder: Secondary | ICD-10-CM | POA: Diagnosis not present

## 2016-05-30 DIAGNOSIS — G8929 Other chronic pain: Secondary | ICD-10-CM | POA: Diagnosis not present

## 2016-05-30 DIAGNOSIS — M6281 Muscle weakness (generalized): Secondary | ICD-10-CM

## 2016-05-30 NOTE — Therapy (Signed)
[facility] [address], [place], [place], [address] Phone: [phone]   Fax:  [phone]  Physical Therapy Treatment  Patient Details  Name: [name] MRN: [id number] Date of Birth: [birth year] Referring Provider: Dr. Lujean Amel  Encounter Date: 05/30/2016      PT End of Session - 05/30/16 1017    Visit Number 12   Number of Visits 20   Date for PT Re-Evaluation 06/16/16   Authorization Type medicare g-code 20th visit   PT Start Time 1016   PT Stop Time 1113   PT Time Calculation (min) 57 min   Activity Tolerance Patient tolerated treatment well   Behavior During Therapy [facility] for tasks assessed/performed      Past Medical History:  Diagnosis Date  . Acute epididymo-orchitis   . Aortic stenosis    s/p bioprosthetic AVR (Dr. Cyndia Bent) 11/2013  . Arthritis   . Diabetes mellitus without complication ([place])   . ED (erectile dysfunction)   . Essential hypertension, benign   . Heart murmur   . Hep C w/o coma, chronic ([place])   . History of blood transfusion   . History of DVT of lower extremity   . Hx of echocardiogram    Echo (8/15):  Severe LVH, EF 60-65%, no RWMA, Gr 3 DD, AVR ok (mean 21 mmHg), MAC, mild LAE, mild to mod RAE  . Hypercholesterolemia     Past Surgical History:  Procedure Laterality Date  . AORTIC VALVE REPLACEMENT N/A 11/19/2013   Procedure: AORTIC VALVE REPLACEMENT (AVR);  Surgeon: Gaye Pollack, MD;  Location: [place];  Service: Open Heart Surgery;  Laterality: N/A;  . BUNIONECTOMY Bilateral 2008  . CARDIAC CATHETERIZATION  10/01/13  . COLONOSCOPY  01/2010   internal hemorrhoids.  Dr Deatra Ina  . ESOPHAGOGASTRODUODENOSCOPY N/A 04/24/2013   Procedure: ESOPHAGOGASTRODUODENOSCOPY (EGD);  Surgeon: Jerene Bears, MD;  Location: [facility] ENDOSCOPY;  Service: Endoscopy;  Laterality: N/A;  . INGUINAL HERNIA REPAIR Right   . INTRAOPERATIVE TRANSESOPHAGEAL ECHOCARDIOGRAM N/A 11/19/2013   Procedure:  INTRAOPERATIVE TRANSESOPHAGEAL ECHOCARDIOGRAM;  Surgeon: Gaye Pollack, MD;  Location: [facility] OR;  Service: Open Heart Surgery;  Laterality: N/A;  . KNEE ARTHROSCOPY Left 05/2009   Dr Collier Salina  . LAMINECTOMY  06/2005   Dr Joya Salm. multiple lumbar level laminectomies.   Marland Kitchen LEFT AND RIGHT HEART CATHETERIZATION WITH CORONARY ANGIOGRAM N/A 10/01/2013   Procedure: LEFT AND RIGHT HEART CATHETERIZATION WITH CORONARY ANGIOGRAM;  Surgeon: Candee Furbish, MD;  Location: [facility] CATH LAB;  Service: Cardiovascular;  Laterality: N/A;  . TEE WITHOUT CARDIOVERSION  11/2003   Aoritic valve sclerosis.   . TONSILLECTOMY      There were no vitals filed for this visit.      Subjective Assessment - 05/30/16 1015    Subjective Pt reports shoulder feeling painful this morning. didn't do anything over the weekend that would have aggrivated it.    Patient Stated Goals improve left shoulder ROM and decrease pain   Currently in Pain? Yes   Pain Score 5    Pain Location Shoulder   Pain Orientation Left   Pain Descriptors / Indicators Shooting;Sharp   Pain Type Chronic pain   Pain Onset More than a month ago   Pain Frequency Intermittent                         [facility] Adult PT Treatment/Exercise - 05/30/16 0001      Shoulder Exercises: Prone  Extension Strengthening;Left;15 reps  #1   Horizontal ABduction 1 Strengthening;Left;5 reps  Pain and difficulty   Other Prone Exercises Triceps extension  #1 2x10     Shoulder Exercises: Standing   External Rotation Strengthening;Left;Theraband  30 reps   Theraband Level (Shoulder External Rotation) Level 2 (Red)   Extension Strengthening;Left;Weights  3x10 #20   Row Strengthening;Both  3x10 #20   Retraction Strengthening;Both   Theraband Level (Shoulder Retraction) Level 2 (Red)   Other Standing Exercises D1 extension Red tband  verbal and tactile cues for form, 30% ROM   Other Standing Exercises Serratus wall slides  with noodle     Shoulder  Exercises: ROM/Strengthening   UBE (Upper Arm Bike) L1 3x3  Therapist present to discuss treatment     Modalities   Modalities Iontophoresis     Moist Heat Therapy   Number Minutes Moist Heat 15 Minutes   Moist Heat Location Shoulder     Electrical Stimulation   Electrical Stimulation Location Lt shoulder   Electrical Stimulation Action IFC   Electrical Stimulation Parameters To tolerance   Electrical Stimulation Goals Pain     Iontophoresis   Type of Iontophoresis Dexamethasone  #6   Location Lt shoulder posterior   Dose 1.0 mL   Time 6 hour patch                  PT Short Term Goals - 05/30/16 1017      PT SHORT TERM GOAL #3   Title ability to reach behind his back with >/= 25% greater ease due to increase left shoulder ROM   Time 4   Period Weeks   Status On-going     PT SHORT TERM GOAL #4   Title left shoulder strength >/= 3/5 making daily activities easier   Time 4   Period Weeks   Status On-going           PT Long Term Goals - 05/30/16 1017      PT LONG TERM GOAL #1   Title independent with HEP   Time 8   Period Weeks   Status On-going     PT LONG TERM GOAL #2   Title increase left shoulder internal rotation so he can wash his back independently   Time 8   Period Weeks   Status On-going     PT LONG TERM GOAL #3   Title increased left shoulder flexion strength >/= 4/5 so he can hammer stakes in ground to plant vegetables   Time 8   Period Weeks   Status On-going     PT LONG TERM GOAL #4   Title reach overhead with left arm to get an item in overhead cabinet due to AROM flexion >/= 130 degrees   Time 8   Period Weeks   Status On-going     PT LONG TERM GOAL #5   Title FOTO score is </= 33% limitation    Baseline 49% limitation   Time 8   Period Weeks   Status On-going     PT LONG TERM GOAL #6   Title pain with left shoulder during daily activities decreased >/=75%   Time 5   Period Weeks   Status On-going                Plan - 05/30/16 1055    Clinical Impression Statement Pt reports increased paintoday over weekend with no apparent cause.Marland Kitchen Pt able to tolerate all exercises well today with minimal discomfort.  Pt will continue to benefit from skilled therapy for shoulder strength and stability.    Rehab Potential Excellent   Clinical Impairments Affecting Rehab Potential None   PT Frequency 3x / week   PT Duration 8 weeks   PT Treatment/Interventions Electrical Stimulation;Iontophoresis 37m/ml Dexamethasone;Moist Heat;Ultrasound;Patient/family education;Neuromuscular re-education;Therapeutic exercise;Therapeutic activities;Manual techniques;Passive range of motion;Taping;Dry needling   PT Next Visit Plan Serratus strengthening, middle and lower strap   Consulted and Agree with Plan of Care Patient      Patient will benefit from skilled therapeutic intervention in order to improve the following deficits and impairments:  Pain, Decreased mobility, Decreased strength, Impaired flexibility, Decreased activity tolerance, Decreased endurance, Increased muscle spasms, Impaired UE functional use, Decreased range of motion  Visit Diagnosis: Stiffness of left shoulder, not elsewhere classified  Muscle weakness (generalized)  Chronic left shoulder pain     Problem List Patient Active Problem List   Diagnosis Date Noted  . Elevated LFTs 08/26/2014  . S/P AVR (aortic valve replacement) 11/22/2013  . Aortic stenosis, severe 11/19/2013  . Aortic stenosis 09/03/2013  . Hyperlipidemia 09/03/2013  . Weight loss 09/03/2013  . Coronary artery disease 09/03/2013  . Hepatitis C 05/29/2013  . Unspecified gastritis and gastroduodenitis without mention of hemorrhage 04/24/2013  . Iron deficiency anemia due to chronic blood loss 04/23/2013  . Hypertension   . Diabetes mellitus without complication ([place]   . Shortness of breath 04/22/2013    KMikle BosworthPTA 05/30/2016, 11:52 AM  [facility] [address] [place] [address]Phone: [phone]  Fax:  [phone] Name: Daniel LYNAMMRN: [id number] of Birth: 8February 16, 1942

## 2016-06-01 ENCOUNTER — Encounter: Payer: Self-pay | Admitting: Physical Therapy

## 2016-06-01 ENCOUNTER — Ambulatory Visit: Payer: Medicare Other | Admitting: Physical Therapy

## 2016-06-01 DIAGNOSIS — G8929 Other chronic pain: Secondary | ICD-10-CM | POA: Diagnosis not present

## 2016-06-01 DIAGNOSIS — M25612 Stiffness of left shoulder, not elsewhere classified: Secondary | ICD-10-CM

## 2016-06-01 DIAGNOSIS — M6281 Muscle weakness (generalized): Secondary | ICD-10-CM

## 2016-06-01 DIAGNOSIS — M25512 Pain in left shoulder: Secondary | ICD-10-CM | POA: Diagnosis not present

## 2016-06-01 NOTE — Therapy (Signed)
[facility] [address] [place], [place], [address] Phone: [phone]   Fax:  [phone]  Physical Therapy Treatment  Patient Details  Name: [name] MRN: [id number] Date of Birth: January 14, 1941 Referring Provider: Dr. Lujean Amel  Encounter Date: 06/01/2016      PT End of Session - 06/01/16 1021    Visit Number 13   Number of Visits 20   Date for PT Re-Evaluation 06/16/16   Authorization Type medicare g-code 20th visit   PT Start Time 1015   PT Stop Time 1100   PT Time Calculation (min) 45 min   Activity Tolerance Patient tolerated treatment well   Behavior During Therapy [facility] for tasks assessed/performed      Past Medical History:  Diagnosis Date  . Acute epididymo-orchitis   . Aortic stenosis    s/p bioprosthetic AVR (Dr. Cyndia Bent) 11/2013  . Arthritis   . Diabetes mellitus without complication ([place])   . ED (erectile dysfunction)   . Essential hypertension, benign   . Heart murmur   . Hep C w/o coma, chronic ([place])   . History of blood transfusion   . History of DVT of lower extremity   . Hx of echocardiogram    Echo (8/15):  Severe LVH, EF 60-65%, no RWMA, Gr 3 DD, AVR ok (mean 21 mmHg), MAC, mild LAE, mild to mod RAE  . Hypercholesterolemia     Past Surgical History:  Procedure Laterality Date  . AORTIC VALVE REPLACEMENT N/A 11/19/2013   Procedure: AORTIC VALVE REPLACEMENT (AVR);  Surgeon: Gaye Pollack, MD;  Location: [place];  Service: Open Heart Surgery;  Laterality: N/A;  . BUNIONECTOMY Bilateral 2008  . CARDIAC CATHETERIZATION  10/01/13  . COLONOSCOPY  01/2010   internal hemorrhoids.  Dr Deatra Ina  . ESOPHAGOGASTRODUODENOSCOPY N/A 04/24/2013   Procedure: ESOPHAGOGASTRODUODENOSCOPY (EGD);  Surgeon: Jerene Bears, MD;  Location: [facility] ENDOSCOPY;  Service: Endoscopy;  Laterality: N/A;  . INGUINAL HERNIA REPAIR Right   . INTRAOPERATIVE TRANSESOPHAGEAL ECHOCARDIOGRAM N/A 11/19/2013   Procedure:  INTRAOPERATIVE TRANSESOPHAGEAL ECHOCARDIOGRAM;  Surgeon: Gaye Pollack, MD;  Location: [facility] OR;  Service: Open Heart Surgery;  Laterality: N/A;  . KNEE ARTHROSCOPY Left 05/2009   Dr Collier Salina  . LAMINECTOMY  06/2005   Dr Joya Salm. multiple lumbar level laminectomies.   Marland Kitchen LEFT AND RIGHT HEART CATHETERIZATION WITH CORONARY ANGIOGRAM N/A 10/01/2013   Procedure: LEFT AND RIGHT HEART CATHETERIZATION WITH CORONARY ANGIOGRAM;  Surgeon: Candee Furbish, MD;  Location: [facility] CATH LAB;  Service: Cardiovascular;  Laterality: N/A;  . TEE WITHOUT CARDIOVERSION  11/2003   Aoritic valve sclerosis.   . TONSILLECTOMY      There were no vitals filed for this visit.      Subjective Assessment - 06/01/16 1019    Subjective Pt reports shoulder feeling ok this morning, no pain at beginging of treatment.    Patient Stated Goals improve left shoulder ROM and decrease pain   Currently in Pain? No/denies                         [facility] Adult PT Treatment/Exercise - 06/01/16 0001      Shoulder Exercises: Supine   Flexion Strengthening;Left;20 reps  #2   Other Supine Exercises Serrratus reach  #2   Other Supine Exercises Manually resisted swerratus activation  x10 3 second holds     Shoulder Exercises: Sidelying   External Rotation Strengthening;Left;20 reps;Weights  #2   ABduction Strengthening;Left;20  reps  #2     Shoulder Exercises: Standing   Extension Strengthening;Left;Weights  3x10 #20   Row Strengthening;Both  3x10 #20   Retraction Strengthening;Both   Other Standing Exercises D1 extension Red tband  verbal and tactile cues for form, 30% ROM   Other Standing Exercises Serratus wall slides  with noodle     Shoulder Exercises: ROM/Strengthening   UBE (Upper Arm Bike) L1 3x3  Therapist present to discuss treatment     Shoulder Exercises: Power [employer] --  Lat pull 2 plates 2x15 pulling to chest                  PT Short Term Goals - 05/30/16 1017      PT SHORT  TERM GOAL #3   Title ability to reach behind his back with >/= 25% greater ease due to increase left shoulder ROM   Time 4   Period Weeks   Status On-going     PT SHORT TERM GOAL #4   Title left shoulder strength >/= 3/5 making daily activities easier   Time 4   Period Weeks   Status On-going           PT Long Term Goals - 05/30/16 1017      PT LONG TERM GOAL #1   Title independent with HEP   Time 8   Period Weeks   Status On-going     PT LONG TERM GOAL #2   Title increase left shoulder internal rotation so he can wash his back independently   Time 8   Period Weeks   Status On-going     PT LONG TERM GOAL #3   Title increased left shoulder flexion strength >/= 4/5 so he can hammer stakes in ground to plant vegetables   Time 8   Period Weeks   Status On-going     PT LONG TERM GOAL #4   Title reach overhead with left arm to get an item in overhead cabinet due to AROM flexion >/= 130 degrees   Time 8   Period Weeks   Status On-going     PT LONG TERM GOAL #5   Title FOTO score is </= 33% limitation    Baseline 49% limitation   Time 8   Period Weeks   Status On-going     PT LONG TERM GOAL #6   Title pain with left shoulder during daily activities decreased >/=75%   Time 5   Period Weeks   Status On-going               Plan - 06/01/16 1021    Clinical Impression Statement Pt planning to return to gardening at the end of this month to begin reparing for spring. Pt has raises flower beds that are 6-8 inches off the ground and will need to be able to lean over and reach, shovel, lift, ect. Pt continues to have weakness and lack of control with wall slides.    Rehab Potential Excellent   Clinical Impairments Affecting Rehab Potential None   PT Frequency 3x / week   PT Duration 8 weeks   PT Treatment/Interventions Electrical Stimulation;Iontophoresis 27m/ml Dexamethasone;Moist Heat;Ultrasound;Patient/family education;Neuromuscular re-education;Therapeutic  exercise;Therapeutic activities;Manual techniques;Passive range of motion;Taping;Dry needling   PT Next Visit Plan Serratus strengthening, middle and lower strap   Consulted and Agree with Plan of Care Patient      Patient will benefit from skilled therapeutic intervention in order to improve the following deficits and  impairments:  Pain, Decreased mobility, Decreased strength, Impaired flexibility, Decreased activity tolerance, Decreased endurance, Increased muscle spasms, Impaired UE functional use, Decreased range of motion  Visit Diagnosis: Stiffness of left shoulder, not elsewhere classified  Muscle weakness (generalized)  Chronic left shoulder pain     Problem List Patient Active Problem List   Diagnosis Date Noted  . Elevated LFTs 08/26/2014  . S/P AVR (aortic valve replacement) 11/22/2013  . Aortic stenosis, severe 11/19/2013  . Aortic stenosis 09/03/2013  . Hyperlipidemia 09/03/2013  . Weight loss 09/03/2013  . Coronary artery disease 09/03/2013  . Hepatitis C 05/29/2013  . Unspecified gastritis and gastroduodenitis without mention of hemorrhage 04/24/2013  . Iron deficiency anemia due to chronic blood loss 04/23/2013  . Hypertension   . Diabetes mellitus without complication ([place])   . Shortness of breath 04/22/2013    Mikle Bosworth PTA 06/01/2016, 11:06 AM  [facility] [address] [place], [place], [address] Phone: [phone]   Fax:  [phone]  Name: [name] MRN: [id number] Date of Birth: August 23, 1940

## 2016-06-03 ENCOUNTER — Encounter: Payer: Self-pay | Admitting: Physical Therapy

## 2016-06-03 ENCOUNTER — Ambulatory Visit: Payer: Medicare Other | Admitting: Physical Therapy

## 2016-06-03 DIAGNOSIS — M25512 Pain in left shoulder: Secondary | ICD-10-CM

## 2016-06-03 DIAGNOSIS — M25612 Stiffness of left shoulder, not elsewhere classified: Secondary | ICD-10-CM | POA: Diagnosis not present

## 2016-06-03 DIAGNOSIS — G8929 Other chronic pain: Secondary | ICD-10-CM

## 2016-06-03 DIAGNOSIS — M6281 Muscle weakness (generalized): Secondary | ICD-10-CM

## 2016-06-03 NOTE — Therapy (Signed)
[facility] [address], [place], [place], [address] Phone: [phone]   Fax:  [phone]  Physical Therapy Treatment  Patient Details  Name: [name] MRN: [id number] Date of Birth: 12/18/1940 Referring Provider: Dr. Lujean Amel  Encounter Date: 06/03/2016      PT End of Session - 06/03/16 1047    Visit Number 14   Number of Visits 20   Date for PT Re-Evaluation 06/16/16   Authorization Type medicare g-code 20th visit   PT Start Time 1030   PT Stop Time 1115   PT Time Calculation (min) 45 min   Activity Tolerance Patient tolerated treatment well   Behavior During Therapy [facility] for tasks assessed/performed      Past Medical History:  Diagnosis Date  . Acute epididymo-orchitis   . Aortic stenosis    s/p bioprosthetic AVR (Dr. Cyndia Bent) 11/2013  . Arthritis   . Diabetes mellitus without complication ([place])   . ED (erectile dysfunction)   . Essential hypertension, benign   . Heart murmur   . Hep C w/o coma, chronic ([place])   . History of blood transfusion   . History of DVT of lower extremity   . Hx of echocardiogram    Echo (8/15):  Severe LVH, EF 60-65%, no RWMA, Gr 3 DD, AVR ok (mean 21 mmHg), MAC, mild LAE, mild to mod RAE  . Hypercholesterolemia     Past Surgical History:  Procedure Laterality Date  . AORTIC VALVE REPLACEMENT N/A 11/19/2013   Procedure: AORTIC VALVE REPLACEMENT (AVR);  Surgeon: Gaye Pollack, MD;  Location: [place];  Service: Open Heart Surgery;  Laterality: N/A;  . BUNIONECTOMY Bilateral 2008  . CARDIAC CATHETERIZATION  10/01/13  . COLONOSCOPY  01/2010   internal hemorrhoids.  Dr Deatra Ina  . ESOPHAGOGASTRODUODENOSCOPY N/A 04/24/2013   Procedure: ESOPHAGOGASTRODUODENOSCOPY (EGD);  Surgeon: Jerene Bears, MD;  Location: [facility] ENDOSCOPY;  Service: Endoscopy;  Laterality: N/A;  . INGUINAL HERNIA REPAIR Right   . INTRAOPERATIVE TRANSESOPHAGEAL ECHOCARDIOGRAM N/A 11/19/2013   Procedure:  INTRAOPERATIVE TRANSESOPHAGEAL ECHOCARDIOGRAM;  Surgeon: Gaye Pollack, MD;  Location: [facility] OR;  Service: Open Heart Surgery;  Laterality: N/A;  . KNEE ARTHROSCOPY Left 05/2009   Dr Collier Salina  . LAMINECTOMY  06/2005   Dr Joya Salm. multiple lumbar level laminectomies.   Marland Kitchen LEFT AND RIGHT HEART CATHETERIZATION WITH CORONARY ANGIOGRAM N/A 10/01/2013   Procedure: LEFT AND RIGHT HEART CATHETERIZATION WITH CORONARY ANGIOGRAM;  Surgeon: Candee Furbish, MD;  Location: [facility] CATH LAB;  Service: Cardiovascular;  Laterality: N/A;  . TEE WITHOUT CARDIOVERSION  11/2003   Aoritic valve sclerosis.   . TONSILLECTOMY      There were no vitals filed for this visit.      Subjective Assessment - 06/03/16 1030    Subjective Pt denies pain this morning and no flare up since previous visit.   Currently in Pain? No/denies                         [facility] Adult PT Treatment/Exercise - 06/03/16 0001      Shoulder Exercises: Supine   Protraction Strengthening;Left;20 reps   Protraction Weight (lbs) 2   Horizontal ABduction Strengthening;Both;20 reps;Theraband   Flexion Strengthening;Left;20 reps  #2   Other Supine Exercises Manually resisted shoulder abduction, adduction, flexion, extension - isometric purturbation - 3x 3mn  x10 3 second holds     Shoulder Exercises: Sidelying   External Rotation Strengthening;Left;20 reps;Weights  #2  ABduction Strengthening;Left;20 reps  #2     Shoulder Exercises: ROM/Strengthening   UBE (Upper Arm Bike) L2 3x3  Therapist present to discuss treatment   Ball on Wall push up on physioball - 20x     Shoulder Exercises: Power Development worker, community 20 reps  2 plates   Row 20 reps  1 plate   External Rotation 20 reps;Other (comment)   External Rotation Limitations 1 plate   Other Power Tower Exercises Lat pull down in sitting - pull to chest - 2 plates                  PT Short Term Goals - 05/30/16 1017      PT SHORT TERM GOAL #3   Title ability  to reach behind his back with >/= 25% greater ease due to increase left shoulder ROM   Time 4   Period Weeks   Status On-going     PT SHORT TERM GOAL #4   Title left shoulder strength >/= 3/5 making daily activities easier   Time 4   Period Weeks   Status On-going           PT Long Term Goals - 05/30/16 1017      PT LONG TERM GOAL #1   Title independent with HEP   Time 8   Period Weeks   Status On-going     PT LONG TERM GOAL #2   Title increase left shoulder internal rotation so he can wash his back independently   Time 8   Period Weeks   Status On-going     PT LONG TERM GOAL #3   Title increased left shoulder flexion strength >/= 4/5 so he can hammer stakes in ground to plant vegetables   Time 8   Period Weeks   Status On-going     PT LONG TERM GOAL #4   Title reach overhead with left arm to get an item in overhead cabinet due to AROM flexion >/= 130 degrees   Time 8   Period Weeks   Status On-going     PT LONG TERM GOAL #5   Title FOTO score is </= 33% limitation    Baseline 49% limitation   Time 8   Period Weeks   Status On-going     PT LONG TERM GOAL #6   Title pain with left shoulder during daily activities decreased >/=75%   Time 5   Period Weeks   Status On-going               Plan - 06/03/16 1038    Clinical Impression Statement Pt reports no changes.  States he feels like he may have some improved ROM.  Pt had a minor pain during treatment today with supine flexion and abduction.  Pt needing cues for scapular and cervical posturing and coordination of muscles during movements.  Pt benefit from skilled PT for strengthening, ROM in order to maximize function.   Rehab Potential Excellent   Clinical Impairments Affecting Rehab Potential None   PT Frequency 3x / week   PT Duration 8 weeks   PT Treatment/Interventions Electrical Stimulation;Iontophoresis 26m/ml Dexamethasone;Moist Heat;Ultrasound;Patient/family education;Neuromuscular  re-education;Therapeutic exercise;Therapeutic activities;Manual techniques;Passive range of motion;Taping;Dry needling   PT Next Visit Plan Serratus strengthening, middle and lower trap   Consulted and Agree with Plan of Care Patient      Patient will benefit from skilled therapeutic intervention in order to improve the following deficits and impairments:  Pain, Decreased  mobility, Decreased strength, Impaired flexibility, Decreased activity tolerance, Decreased endurance, Increased muscle spasms, Impaired UE functional use, Decreased range of motion  Visit Diagnosis: Stiffness of left shoulder, not elsewhere classified  Muscle weakness (generalized)  Chronic left shoulder pain     Problem List Patient Active Problem List   Diagnosis Date Noted  . Elevated LFTs 08/26/2014  . S/P AVR (aortic valve replacement) 11/22/2013  . Aortic stenosis, severe 11/19/2013  . Aortic stenosis 09/03/2013  . Hyperlipidemia 09/03/2013  . Weight loss 09/03/2013  . Coronary artery disease 09/03/2013  . Hepatitis C 05/29/2013  . Unspecified gastritis and gastroduodenitis without mention of hemorrhage 04/24/2013  . Iron deficiency anemia due to chronic blood loss 04/23/2013  . Hypertension   . Diabetes mellitus without complication ([place])   . Shortness of breath 04/22/2013    Zannie Cove, PT 06/03/2016, 11:24 AM  [facility] [address] [place], [place], [address] Phone: [phone]   Fax:  [phone]  Name: [name] MRN: [id number] Date of Birth: August 12, 1940

## 2016-06-06 ENCOUNTER — Ambulatory Visit: Payer: Medicare Other | Admitting: Physical Therapy

## 2016-06-06 ENCOUNTER — Encounter: Payer: Self-pay | Admitting: Physical Therapy

## 2016-06-06 DIAGNOSIS — M6281 Muscle weakness (generalized): Secondary | ICD-10-CM | POA: Diagnosis not present

## 2016-06-06 DIAGNOSIS — M25512 Pain in left shoulder: Secondary | ICD-10-CM

## 2016-06-06 DIAGNOSIS — M25612 Stiffness of left shoulder, not elsewhere classified: Secondary | ICD-10-CM

## 2016-06-06 DIAGNOSIS — G8929 Other chronic pain: Secondary | ICD-10-CM | POA: Diagnosis not present

## 2016-06-06 NOTE — Therapy (Signed)
[facility] [address], [place], [place], [address] Phone: [phone]   Fax:  [phone]  Physical Therapy Treatment  Patient Details  Name: [name] MRN: [id number] Date of Birth: 13-Oct-1940 Referring Provider: Dr. Lujean Amel  Encounter Date: 06/06/2016      PT End of Session - 06/06/16 1017    Visit Number 15   Number of Visits 20   Date for PT Re-Evaluation 06/16/16   Authorization Type medicare g-code 20th visit   PT Start Time 1015   PT Stop Time 1056   PT Time Calculation (min) 41 min   Activity Tolerance Patient tolerated treatment well   Behavior During Therapy [facility] for tasks assessed/performed      Past Medical History:  Diagnosis Date  . Acute epididymo-orchitis   . Aortic stenosis    s/p bioprosthetic AVR (Dr. Cyndia Bent) 11/2013  . Arthritis   . Diabetes mellitus without complication ([place])   . ED (erectile dysfunction)   . Essential hypertension, benign   . Heart murmur   . Hep C w/o coma, chronic ([place])   . History of blood transfusion   . History of DVT of lower extremity   . Hx of echocardiogram    Echo (8/15):  Severe LVH, EF 60-65%, no RWMA, Gr 3 DD, AVR ok (mean 21 mmHg), MAC, mild LAE, mild to mod RAE  . Hypercholesterolemia     Past Surgical History:  Procedure Laterality Date  . AORTIC VALVE REPLACEMENT N/A 11/19/2013   Procedure: AORTIC VALVE REPLACEMENT (AVR);  Surgeon: Gaye Pollack, MD;  Location: [place];  Service: Open Heart Surgery;  Laterality: N/A;  . BUNIONECTOMY Bilateral 2008  . CARDIAC CATHETERIZATION  10/01/13  . COLONOSCOPY  01/2010   internal hemorrhoids.  Dr Deatra Ina  . ESOPHAGOGASTRODUODENOSCOPY N/A 04/24/2013   Procedure: ESOPHAGOGASTRODUODENOSCOPY (EGD);  Surgeon: Jerene Bears, MD;  Location: [facility] ENDOSCOPY;  Service: Endoscopy;  Laterality: N/A;  . INGUINAL HERNIA REPAIR Right   . INTRAOPERATIVE TRANSESOPHAGEAL ECHOCARDIOGRAM N/A 11/19/2013   Procedure:  INTRAOPERATIVE TRANSESOPHAGEAL ECHOCARDIOGRAM;  Surgeon: Gaye Pollack, MD;  Location: [facility] OR;  Service: Open Heart Surgery;  Laterality: N/A;  . KNEE ARTHROSCOPY Left 05/2009   Dr Collier Salina  . LAMINECTOMY  06/2005   Dr Joya Salm. multiple lumbar level laminectomies.   Marland Kitchen LEFT AND RIGHT HEART CATHETERIZATION WITH CORONARY ANGIOGRAM N/A 10/01/2013   Procedure: LEFT AND RIGHT HEART CATHETERIZATION WITH CORONARY ANGIOGRAM;  Surgeon: Candee Furbish, MD;  Location: [facility] CATH LAB;  Service: Cardiovascular;  Laterality: N/A;  . TEE WITHOUT CARDIOVERSION  11/2003   Aoritic valve sclerosis.   . TONSILLECTOMY      There were no vitals filed for this visit.      Subjective Assessment - 06/06/16 1015    Subjective Pt reports shouder doing pretty good this whole week.   Patient Stated Goals improve left shoulder ROM and decrease pain   Currently in Pain? No/denies   Pain Score 0-No pain                         [facility] Adult PT Treatment/Exercise - 06/06/16 0001      Shoulder Exercises: Supine   Protraction Strengthening;Left;20 reps   Protraction Weight (lbs) 2   Flexion Strengthening;Left;20 reps  #2   Other Supine Exercises --   Other Supine Exercises Manual resisted middle trap activation     Shoulder Exercises: Prone   Extension Strengthening;Left;15 reps  #2  Horizontal ABduction 1 Strengthening;Left;20 reps  in pain free range, small movement   Other Prone Exercises Triceps extension  #2 2x10     Shoulder Exercises: Standing   External Rotation Strengthening;Left;Theraband  30 reps   Theraband Level (Shoulder External Rotation) Level 3 (Green)   Other Standing Exercises Serratus wall slides  With physio ball     Shoulder Exercises: ROM/Strengthening   UBE (Upper Arm Bike) L2 3x3  Therapist present to discuss treatment   Ball on Wall push up on physioball - 20x     Shoulder Exercises: Music therapist Stretch 2 reps;10 seconds  Chest stretch in doorway      Shoulder Exercises: Power Development worker, community 20 reps  #25   Row 20 reps  #25   Other Power Tower Exercises Lat pull down pull to chest - 2 plates                  PT Short Term Goals - 05/30/16 1017      PT SHORT TERM GOAL #3   Title ability to reach behind his back with >/= 25% greater ease due to increase left shoulder ROM   Time 4   Period Weeks   Status On-going     PT SHORT TERM GOAL #4   Title left shoulder strength >/= 3/5 making daily activities easier   Time 4   Period Weeks   Status On-going           PT Long Term Goals - 05/30/16 1017      PT LONG TERM GOAL #1   Title independent with HEP   Time 8   Period Weeks   Status On-going     PT LONG TERM GOAL #2   Title increase left shoulder internal rotation so he can wash his back independently   Time 8   Period Weeks   Status On-going     PT LONG TERM GOAL #3   Title increased left shoulder flexion strength >/= 4/5 so he can hammer stakes in ground to plant vegetables   Time 8   Period Weeks   Status On-going     PT LONG TERM GOAL #4   Title reach overhead with left arm to get an item in overhead cabinet due to AROM flexion >/= 130 degrees   Time 8   Period Weeks   Status On-going     PT LONG TERM GOAL #5   Title FOTO score is </= 33% limitation    Baseline 49% limitation   Time 8   Period Weeks   Status On-going     PT LONG TERM GOAL #6   Title pain with left shoulder during daily activities decreased >/=75%   Time 5   Period Weeks   Status On-going               Plan - 06/06/16 1032    Clinical Impression Statement Pt doing well with strengthening. Reports having no pain in shoulder this week even after working and staying active. Pt continues to be limited in over head reach due to pain and pinching. Pt continues to have poor scapular patterns needing some verbal cues for proper form.  Pt will continue to benefit from skilled therapy for scapular stability and shoulder  strength.    Rehab Potential Excellent   Clinical Impairments Affecting Rehab Potential None   PT Frequency 3x / week   PT Duration 8 weeks   PT Treatment/Interventions Electrical  Stimulation;Iontophoresis 23m/ml Dexamethasone;Moist Heat;Ultrasound;Patient/family education;Neuromuscular re-education;Therapeutic exercise;Therapeutic activities;Manual techniques;Passive range of motion;Taping;Dry needling   PT Next Visit Plan Scapular stability, Theraputic activities, carrying, reaching, lifting   Consulted and Agree with Plan of Care Patient      Patient will benefit from skilled therapeutic intervention in order to improve the following deficits and impairments:  Pain, Decreased mobility, Decreased strength, Impaired flexibility, Decreased activity tolerance, Decreased endurance, Increased muscle spasms, Impaired UE functional use, Decreased range of motion  Visit Diagnosis: Stiffness of left shoulder, not elsewhere classified  Muscle weakness (generalized)  Chronic left shoulder pain     Problem List Patient Active Problem List   Diagnosis Date Noted  . Elevated LFTs 08/26/2014  . S/P AVR (aortic valve replacement) 11/22/2013  . Aortic stenosis, severe 11/19/2013  . Aortic stenosis 09/03/2013  . Hyperlipidemia 09/03/2013  . Weight loss 09/03/2013  . Coronary artery disease 09/03/2013  . Hepatitis C 05/29/2013  . Unspecified gastritis and gastroduodenitis without mention of hemorrhage 04/24/2013  . Iron deficiency anemia due to chronic blood loss 04/23/2013  . Hypertension   . [place]   . Shortness of breath 04/22/2013    KMikle BosworthPTA 06/06/2016, 10:57 AM  [facility] [address] [place] [address]Phone: [phone]  Fax:  [phone] Name: Daniel KUTZMRN: [id number] of Birth: 8Apr 12, 1942

## 2016-06-08 ENCOUNTER — Ambulatory Visit: Payer: Medicare Other | Admitting: Physical Therapy

## 2016-06-08 ENCOUNTER — Encounter: Payer: Self-pay | Admitting: Physical Therapy

## 2016-06-08 DIAGNOSIS — M25512 Pain in left shoulder: Secondary | ICD-10-CM | POA: Diagnosis not present

## 2016-06-08 DIAGNOSIS — M25612 Stiffness of left shoulder, not elsewhere classified: Secondary | ICD-10-CM | POA: Diagnosis not present

## 2016-06-08 DIAGNOSIS — M6281 Muscle weakness (generalized): Secondary | ICD-10-CM

## 2016-06-08 DIAGNOSIS — G8929 Other chronic pain: Secondary | ICD-10-CM | POA: Diagnosis not present

## 2016-06-08 NOTE — Therapy (Signed)
[facility] [address] [place], [place], [address] Phone: [phone]   Fax:  [phone]  Physical Therapy Treatment  Patient Details  Name: [name] MRN: [id number] Date of Birth: [birth year] Referring Provider: Dr. Lujean Amel  Encounter Date: 06/08/2016      PT End of Session - 06/08/16 1013    Visit Number 16   Number of Visits 20   Date for PT Re-Evaluation 06/16/16   Authorization Type medicare g-code 20th visit   PT Start Time 1013   PT Stop Time 1055   PT Time Calculation (min) 42 min   Activity Tolerance Patient tolerated treatment well   Behavior During Therapy [facility] for tasks assessed/performed      Past Medical History:  Diagnosis Date  . Acute epididymo-orchitis   . Aortic stenosis    s/p bioprosthetic AVR (Dr. Cyndia Bent) 11/2013  . Arthritis   . Diabetes mellitus without complication ([place])   . ED (erectile dysfunction)   . Essential hypertension, benign   . Heart murmur   . Hep C w/o coma, chronic ([place])   . History of blood transfusion   . History of DVT of lower extremity   . Hx of echocardiogram    Echo (8/15):  Severe LVH, EF 60-65%, no RWMA, Gr 3 DD, AVR ok (mean 21 mmHg), MAC, mild LAE, mild to mod RAE  . Hypercholesterolemia     Past Surgical History:  Procedure Laterality Date  . AORTIC VALVE REPLACEMENT N/A 11/19/2013   Procedure: AORTIC VALVE REPLACEMENT (AVR);  Surgeon: Gaye Pollack, MD;  Location: [place];  Service: Open Heart Surgery;  Laterality: N/A;  . BUNIONECTOMY Bilateral 2008  . CARDIAC CATHETERIZATION  10/01/13  . COLONOSCOPY  01/2010   internal hemorrhoids.  Dr Deatra Ina  . ESOPHAGOGASTRODUODENOSCOPY N/A 04/24/2013   Procedure: ESOPHAGOGASTRODUODENOSCOPY (EGD);  Surgeon: Jerene Bears, MD;  Location: [facility] ENDOSCOPY;  Service: Endoscopy;  Laterality: N/A;  . INGUINAL HERNIA REPAIR Right   . INTRAOPERATIVE TRANSESOPHAGEAL ECHOCARDIOGRAM N/A 11/19/2013   Procedure:  INTRAOPERATIVE TRANSESOPHAGEAL ECHOCARDIOGRAM;  Surgeon: Gaye Pollack, MD;  Location: [facility] OR;  Service: Open Heart Surgery;  Laterality: N/A;  . KNEE ARTHROSCOPY Left 05/2009   Dr Collier Salina  . LAMINECTOMY  06/2005   Dr Joya Salm. multiple lumbar level laminectomies.   Marland Kitchen LEFT AND RIGHT HEART CATHETERIZATION WITH CORONARY ANGIOGRAM N/A 10/01/2013   Procedure: LEFT AND RIGHT HEART CATHETERIZATION WITH CORONARY ANGIOGRAM;  Surgeon: Candee Furbish, MD;  Location: [facility] CATH LAB;  Service: Cardiovascular;  Laterality: N/A;  . TEE WITHOUT CARDIOVERSION  11/2003   Aoritic valve sclerosis.   . TONSILLECTOMY      There were no vitals filed for this visit.      Subjective Assessment - 06/08/16 1013    Subjective Pt reports shoulder feeling alright today.    Patient Stated Goals improve left shoulder ROM and decrease pain   Currently in Pain? No/denies   Pain Score 0-No pain                         [facility] Adult PT Treatment/Exercise - 06/08/16 0001      Shoulder Exercises: Prone   Flexion Strengthening;Left;10 reps  #0 difficult but able   Extension Strengthening;Left;15 reps  #3   Horizontal ABduction 1 Strengthening;Left;20 reps  in pain free range, small movement   Other Prone Exercises Triceps extension  #3 2x10     Shoulder Exercises: Standing  Other Standing Exercises Isometrics ball on wall   Other Standing Exercises Serratus wall slides  With physio ball     Shoulder Exercises: ROM/Strengthening   UBE (Upper Arm Bike) L2 3x3  Therapist present to discuss treatment   Ball on Wall push up on physioball - 20x   Other ROM/Strengthening Exercises UE on wall  flexion     Shoulder Exercises: Power Development worker, community 20 reps  #25   Row 20 reps  #25   Other Power Tower Exercises Lat pull down pull to chest - 2 plates     Manual Therapy   Manual Therapy Joint mobilization;Soft tissue mobilization   Joint Mobilization GH anterior posterior glides, inf sup glides   Soft  tissue mobilization Pec minor, upper trap                  PT Short Term Goals - 06/08/16 1014      PT SHORT TERM GOAL #3   Title ability to reach behind his back with >/= 25% greater ease due to increase left shoulder ROM   Time 4   Period Weeks   Status On-going     PT SHORT TERM GOAL #4   Title left shoulder strength >/= 3/5 making daily activities easier   Time 4   Period Weeks   Status On-going           PT Long Term Goals - 06/08/16 1015      PT LONG TERM GOAL #1   Title independent with HEP   Time 8   Period Weeks   Status On-going     PT LONG TERM GOAL #2   Title increase left shoulder internal rotation so he can wash his back independently   Time 8   Period Weeks   Status On-going     PT LONG TERM GOAL #3   Title increased left shoulder flexion strength >/= 4/5 so he can hammer stakes in ground to plant vegetables   Time 8   Period Weeks   Status On-going     PT LONG TERM GOAL #4   Title reach overhead with left arm to get an item in overhead cabinet due to AROM flexion >/= 130 degrees   Time 8   Period Weeks   Status On-going     PT LONG TERM GOAL #5   Title FOTO score is </= 33% limitation    Time 8   Period Weeks   Status On-going     PT LONG TERM GOAL #6   Title pain with left shoulder during daily activities decreased >/=75%   Time 5   Period Weeks   Status On-going               Plan - 06/08/16 1104    Clinical Impression Statement Pt contineus to have limited shoulder flexion and over head use due to pain and improper biomechanics of [place] joint. Pt progressing well with all strengthening. Pt has had pain relief in shoulder but conitnues to be limited with daily acitvities do to pain and ROM. Pt will conintue to benefit from skilled therapy for shoulder stability and scapular strength.    Rehab Potential Excellent   Clinical Impairments Affecting Rehab Potential None   PT Frequency 3x / week   PT Duration 8 weeks   PT  Treatment/Interventions Electrical Stimulation;Iontophoresis 33m/ml Dexamethasone;Moist Heat;Ultrasound;Patient/family education;Neuromuscular re-education;Therapeutic exercise;Therapeutic activities;Manual techniques;Passive range of motion;Taping;Dry needling   PT Next Visit Plan Shoulder strength  Consulted and Agree with Plan of Care Patient      Patient will benefit from skilled therapeutic intervention in order to improve the following deficits and impairments:  Pain, Decreased mobility, Decreased strength, Impaired flexibility, Decreased activity tolerance, Decreased endurance, Increased muscle spasms, Impaired UE functional use, Decreased range of motion  Visit Diagnosis: Stiffness of left shoulder, not elsewhere classified  Muscle weakness (generalized)  Chronic left shoulder pain     Problem List Patient Active Problem List   Diagnosis Date Noted  . Elevated LFTs 08/26/2014  . S/P AVR (aortic valve replacement) 11/22/2013  . Aortic stenosis, severe 11/19/2013  . Aortic stenosis 09/03/2013  . Hyperlipidemia 09/03/2013  . Weight loss 09/03/2013  . Coronary artery disease 09/03/2013  . Hepatitis C 05/29/2013  . Unspecified gastritis and gastroduodenitis without mention of hemorrhage 04/24/2013  . Iron deficiency anemia due to chronic blood loss 04/23/2013  . Hypertension   . Diabetes mellitus without complication ([place])   . Shortness of breath 04/22/2013    Mikle Bosworth PTA 06/08/2016, 11:37 AM  [facility] [address] [place], [place], [address] Phone: [phone]   Fax:  [phone]  Name: [name] MRN: [id number] Date of Birth: 1940-09-17

## 2016-06-10 ENCOUNTER — Ambulatory Visit: Payer: Medicare Other | Attending: Family Medicine | Admitting: Physical Therapy

## 2016-06-10 ENCOUNTER — Encounter: Payer: Self-pay | Admitting: Physical Therapy

## 2016-06-10 DIAGNOSIS — M25512 Pain in left shoulder: Secondary | ICD-10-CM | POA: Diagnosis not present

## 2016-06-10 DIAGNOSIS — M6281 Muscle weakness (generalized): Secondary | ICD-10-CM

## 2016-06-10 DIAGNOSIS — G8929 Other chronic pain: Secondary | ICD-10-CM | POA: Diagnosis not present

## 2016-06-10 DIAGNOSIS — M25612 Stiffness of left shoulder, not elsewhere classified: Secondary | ICD-10-CM | POA: Insufficient documentation

## 2016-06-10 NOTE — Therapy (Signed)
[facility] [address] [place], [place], [address] Phone: [phone]   Fax:  [phone]  Physical Therapy Treatment  Patient Details  Name: [name] MRN: [id number] Date of Birth: June 19, 1940 Referring Provider: Dr. Lujean Amel  Encounter Date: 06/10/2016      PT End of Session - 06/10/16 1012    Visit Number 17   Number of Visits 20   Date for PT Re-Evaluation 06/16/16   Authorization Type medicare g-code 20th visit   PT Start Time 1010   PT Stop Time 1052   PT Time Calculation (min) 42 min   Activity Tolerance Patient tolerated treatment well   Behavior During Therapy [facility] for tasks assessed/performed      Past Medical History:  Diagnosis Date  . Acute epididymo-orchitis   . Aortic stenosis    s/p bioprosthetic AVR (Dr. Cyndia Bent) 11/2013  . Arthritis   . Diabetes mellitus without complication ([place])   . ED (erectile dysfunction)   . Essential hypertension, benign   . Heart murmur   . Hep C w/o coma, chronic ([place])   . History of blood transfusion   . History of DVT of lower extremity   . Hx of echocardiogram    Echo (8/15):  Severe LVH, EF 60-65%, no RWMA, Gr 3 DD, AVR ok (mean 21 mmHg), MAC, mild LAE, mild to mod RAE  . Hypercholesterolemia     Past Surgical History:  Procedure Laterality Date  . AORTIC VALVE REPLACEMENT N/A 11/19/2013   Procedure: AORTIC VALVE REPLACEMENT (AVR);  Surgeon: Gaye Pollack, MD;  Location: [place];  Service: Open Heart Surgery;  Laterality: N/A;  . BUNIONECTOMY Bilateral 2008  . CARDIAC CATHETERIZATION  10/01/13  . COLONOSCOPY  01/2010   internal hemorrhoids.  Dr Deatra Ina  . ESOPHAGOGASTRODUODENOSCOPY N/A 04/24/2013   Procedure: ESOPHAGOGASTRODUODENOSCOPY (EGD);  Surgeon: Jerene Bears, MD;  Location: [facility] ENDOSCOPY;  Service: Endoscopy;  Laterality: N/A;  . INGUINAL HERNIA REPAIR Right   . INTRAOPERATIVE TRANSESOPHAGEAL ECHOCARDIOGRAM N/A 11/19/2013   Procedure:  INTRAOPERATIVE TRANSESOPHAGEAL ECHOCARDIOGRAM;  Surgeon: Gaye Pollack, MD;  Location: [facility] OR;  Service: Open Heart Surgery;  Laterality: N/A;  . KNEE ARTHROSCOPY Left 05/2009   Dr Collier Salina  . LAMINECTOMY  06/2005   Dr Joya Salm. multiple lumbar level laminectomies.   Marland Kitchen LEFT AND RIGHT HEART CATHETERIZATION WITH CORONARY ANGIOGRAM N/A 10/01/2013   Procedure: LEFT AND RIGHT HEART CATHETERIZATION WITH CORONARY ANGIOGRAM;  Surgeon: Candee Furbish, MD;  Location: [facility] CATH LAB;  Service: Cardiovascular;  Laterality: N/A;  . TEE WITHOUT CARDIOVERSION  11/2003   Aoritic valve sclerosis.   . TONSILLECTOMY      There were no vitals filed for this visit.      Subjective Assessment - 06/10/16 1013    Subjective Reports has pain sometimes when just sitting, then gets better when he warms up with the exercises   Patient Stated Goals improve left shoulder ROM and decrease pain   Currently in Pain? No/denies                         [facility] Adult PT Treatment/Exercise - 06/10/16 0001      Shoulder Exercises: Supine   Other Supine Exercises Serrratus reach - 30x     Shoulder Exercises: Prone   Flexion Strengthening;Left;20 reps  #0 difficult but able   Extension Strengthening;Left;15 reps  #3   Horizontal ABduction 1 Strengthening;Left  30, in pain free range, small  movement   Other Prone Exercises Triceps extension  #3 2x10     Shoulder Exercises: Standing   External Rotation Strengthening;Left;Theraband  30 reps   Theraband Level (Shoulder External Rotation) Level 2 (Red);Level 3 (Green)  30, 15     Shoulder Exercises: ROM/Strengthening   UBE (Upper Arm Bike) L3, 3x3  Therapist present to discuss treatment   Ball on Wall push up on physioball - 20x     Shoulder Exercises: Stretch   Other Shoulder Stretches flexion on wall - 15x     Shoulder Exercises: Power Development worker, community 20 reps  #25 - 2 sets   Row 20 reps  #25  - 2 sets   Other Power Tower Exercises Lat pull down  pull to chest - 3 plates  2 sets of 20 reps                  PT Short Term Goals - 06/08/16 1014      PT SHORT TERM GOAL #3   Title ability to reach behind his back with >/= 25% greater ease due to increase left shoulder ROM   Time 4   Period Weeks   Status On-going     PT SHORT TERM GOAL #4   Title left shoulder strength >/= 3/5 making daily activities easier   Time 4   Period Weeks   Status On-going           PT Long Term Goals - 06/08/16 1015      PT LONG TERM GOAL #1   Title independent with HEP   Time 8   Period Weeks   Status On-going     PT LONG TERM GOAL #2   Title increase left shoulder internal rotation so he can wash his back independently   Time 8   Period Weeks   Status On-going     PT LONG TERM GOAL #3   Title increased left shoulder flexion strength >/= 4/5 so he can hammer stakes in ground to plant vegetables   Time 8   Period Weeks   Status On-going     PT LONG TERM GOAL #4   Title reach overhead with left arm to get an item in overhead cabinet due to AROM flexion >/= 130 degrees   Time 8   Period Weeks   Status On-going     PT LONG TERM GOAL #5   Title FOTO score is </= 33% limitation    Time 8   Period Weeks   Status On-going     PT LONG TERM GOAL #6   Title pain with left shoulder during daily activities decreased >/=75%   Time 5   Period Weeks   Status On-going               Plan - 06/10/16 1014    Clinical Impression Statement Pt has improved PROM with less clicking and popping.  Continues to have inability to reach overhead due to weakness and coordination of movments.  Skilled PT to cont strengthening for improved function.   Rehab Potential Excellent   Clinical Impairments Affecting Rehab Potential None   PT Frequency 3x / week   PT Duration 8 weeks   PT Treatment/Interventions Electrical Stimulation;Iontophoresis 66m/ml Dexamethasone;Moist Heat;Ultrasound;Patient/family education;Neuromuscular  re-education;Therapeutic exercise;Therapeutic activities;Manual techniques;Passive range of motion;Taping;Dry needling   PT Next Visit Plan Shoulder strength, f/u with addition of shoulder flexion/abduction  bent forward position   PT Home Exercise Plan added bent over flexion and  abduction   Consulted and Agree with Plan of Care Patient      Patient will benefit from skilled therapeutic intervention in order to improve the following deficits and impairments:  Pain, Decreased mobility, Decreased strength, Impaired flexibility, Decreased activity tolerance, Decreased endurance, Increased muscle spasms, Impaired UE functional use, Decreased range of motion  Visit Diagnosis: Stiffness of left shoulder, not elsewhere classified  Muscle weakness (generalized)  Chronic left shoulder pain     Problem List Patient Active Problem List   Diagnosis Date Noted  . Elevated LFTs 08/26/2014  . S/P AVR (aortic valve replacement) 11/22/2013  . Aortic stenosis, severe 11/19/2013  . Aortic stenosis 09/03/2013  . Hyperlipidemia 09/03/2013  . Weight loss 09/03/2013  . Coronary artery disease 09/03/2013  . Hepatitis C 05/29/2013  . Unspecified gastritis and gastroduodenitis without mention of hemorrhage 04/24/2013  . Iron deficiency anemia due to chronic blood loss 04/23/2013  . Hypertension   . Diabetes mellitus without complication ([place])   . Shortness of breath 04/22/2013    Zannie Cove, PT 06/10/2016, 10:57 AM  [facility] [address] [place], [place], [address] Phone: [phone]   Fax:  [phone]  Name: [name] MRN: [id number] Date of Birth: 14-Nov-1940

## 2016-06-13 ENCOUNTER — Encounter: Payer: Self-pay | Admitting: Physical Therapy

## 2016-06-13 ENCOUNTER — Ambulatory Visit: Payer: Medicare Other | Admitting: Physical Therapy

## 2016-06-13 DIAGNOSIS — G8929 Other chronic pain: Secondary | ICD-10-CM | POA: Diagnosis not present

## 2016-06-13 DIAGNOSIS — M25512 Pain in left shoulder: Secondary | ICD-10-CM

## 2016-06-13 DIAGNOSIS — M25612 Stiffness of left shoulder, not elsewhere classified: Secondary | ICD-10-CM

## 2016-06-13 DIAGNOSIS — M6281 Muscle weakness (generalized): Secondary | ICD-10-CM

## 2016-06-13 NOTE — Therapy (Signed)
[facility] [address], [place], [place], [address] Phone: [phone]   Fax:  [phone]  Physical Therapy Treatment  Patient Details  Name: [name] MRN: [id number] Date of Birth: 04/24/1941 Referring Provider: Dr. Lujean Amel  Encounter Date: 06/13/2016      PT End of Session - 06/13/16 1054    Visit Number 18   Number of Visits 20   Date for PT Re-Evaluation 06/16/16   Authorization Type medicare g-code 20th visit   PT Start Time 1015   PT Stop Time 1056   PT Time Calculation (min) 41 min   Activity Tolerance Patient tolerated treatment well   Behavior During Therapy [facility] for tasks assessed/performed      Past Medical History:  Diagnosis Date  . Acute epididymo-orchitis   . Aortic stenosis    s/p bioprosthetic AVR (Dr. Cyndia Bent) 11/2013  . Arthritis   . Diabetes mellitus without complication ([place])   . ED (erectile dysfunction)   . Essential hypertension, benign   . Heart murmur   . Hep C w/o coma, chronic ([place])   . History of blood transfusion   . History of DVT of lower extremity   . Hx of echocardiogram    Echo (8/15):  Severe LVH, EF 60-65%, no RWMA, Gr 3 DD, AVR ok (mean 21 mmHg), MAC, mild LAE, mild to mod RAE  . Hypercholesterolemia     Past Surgical History:  Procedure Laterality Date  . AORTIC VALVE REPLACEMENT N/A 11/19/2013   Procedure: AORTIC VALVE REPLACEMENT (AVR);  Surgeon: Gaye Pollack, MD;  Location: [place];  Service: Open Heart Surgery;  Laterality: N/A;  . BUNIONECTOMY Bilateral 2008  . CARDIAC CATHETERIZATION  10/01/13  . COLONOSCOPY  01/2010   internal hemorrhoids.  Dr Deatra Ina  . ESOPHAGOGASTRODUODENOSCOPY N/A 04/24/2013   Procedure: ESOPHAGOGASTRODUODENOSCOPY (EGD);  Surgeon: Jerene Bears, MD;  Location: [facility] ENDOSCOPY;  Service: Endoscopy;  Laterality: N/A;  . INGUINAL HERNIA REPAIR Right   . INTRAOPERATIVE TRANSESOPHAGEAL ECHOCARDIOGRAM N/A 11/19/2013   Procedure:  INTRAOPERATIVE TRANSESOPHAGEAL ECHOCARDIOGRAM;  Surgeon: Gaye Pollack, MD;  Location: [facility] OR;  Service: Open Heart Surgery;  Laterality: N/A;  . KNEE ARTHROSCOPY Left 05/2009   Dr Collier Salina  . LAMINECTOMY  06/2005   Dr Joya Salm. multiple lumbar level laminectomies.   Marland Kitchen LEFT AND RIGHT HEART CATHETERIZATION WITH CORONARY ANGIOGRAM N/A 10/01/2013   Procedure: LEFT AND RIGHT HEART CATHETERIZATION WITH CORONARY ANGIOGRAM;  Surgeon: Candee Furbish, MD;  Location: [facility] CATH LAB;  Service: Cardiovascular;  Laterality: N/A;  . TEE WITHOUT CARDIOVERSION  11/2003   Aoritic valve sclerosis.   . TONSILLECTOMY      There were no vitals filed for this visit.      Subjective Assessment - 06/13/16 1017    Subjective Pt reports shoulder feeling pretty good today. No pain shoulder is just aggrivated which patient contributes to arthritis.    Patient Stated Goals improve left shoulder ROM and decrease pain   Currently in Pain? No/denies   Pain Score 0-No pain                         [facility] Adult PT Treatment/Exercise - 06/13/16 0001      Shoulder Exercises: Prone   Flexion Strengthening;Left;20 reps  #0 difficult but able   Extension Strengthening;Left;15 reps  #3   Horizontal ABduction 1 Strengthening;Left  30, in pain free range, small movement     Shoulder Exercises: Standing  Retraction Strengthening;Both   Other Standing Exercises D1 extension Red tband  verbal and tactile cues for form, 30% ROM     Shoulder Exercises: ROM/Strengthening   UBE (Upper Arm Bike) L3, 3x3  Therapist present to discuss treatment   Ball on Wall push up on physioball - 20x   Other ROM/Strengthening Exercises UE on wall  flexion     Shoulder Exercises: Power Development worker, community 20 reps  #25 - 2 sets   Row 20 reps  #25  - 2 sets   Other Power Tower Exercises Lat pull down pull to chest - 3 plates  2 sets of 20 reps   Other Power Tower Exercises Chest press  3x10 #25                  PT  Short Term Goals - 06/13/16 1058      PT SHORT TERM GOAL #3   Title ability to reach behind his back with >/= 25% greater ease due to increase left shoulder ROM   Time 4   Period Weeks   Status On-going     PT SHORT TERM GOAL #4   Title left shoulder strength >/= 3/5 making daily activities easier   Time 4   Period Weeks   Status On-going           PT Long Term Goals - 06/13/16 1057      PT LONG TERM GOAL #1   Title independent with HEP   Time 8   Period Weeks   Status On-going     PT LONG TERM GOAL #2   Title increase left shoulder internal rotation so he can wash his back independently   Time 8   Period Weeks   Status On-going     PT LONG TERM GOAL #3   Title increased left shoulder flexion strength >/= 4/5 so he can hammer stakes in ground to plant vegetables   Time 8   Period Weeks   Status On-going     PT LONG TERM GOAL #4   Title reach overhead with left arm to get an item in overhead cabinet due to AROM flexion >/= 130 degrees   Time 8   Period Weeks   Status On-going               Plan - 06/13/16 1100    Clinical Impression Statement Patient strength and shoulder stability continues to improve slowly. Continues to be challenged by current resistance and exercises. Pt conitnues to be limited in over head shoulder use due to pain and limtied ROM. Pt able to tolerate all treatment well and will cotninue to progress towards goals with skilled therapy.    Rehab Potential Excellent   Clinical Impairments Affecting Rehab Potential None   PT Frequency 3x / week   PT Duration 8 weeks   PT Treatment/Interventions Electrical Stimulation;Iontophoresis 53m/ml Dexamethasone;Moist Heat;Ultrasound;Patient/family education;Neuromuscular re-education;Therapeutic exercise;Therapeutic activities;Manual techniques;Passive range of motion;Taping;Dry needling   PT Next Visit Plan Supine flexion, abduction, extension   Consulted and Agree with Plan of Care Patient       Patient will benefit from skilled therapeutic intervention in order to improve the following deficits and impairments:  Pain, Decreased mobility, Decreased strength, Impaired flexibility, Decreased activity tolerance, Decreased endurance, Increased muscle spasms, Impaired UE functional use, Decreased range of motion  Visit Diagnosis: Stiffness of left shoulder, not elsewhere classified  Muscle weakness (generalized)  Chronic left shoulder pain     Problem List Patient  Active Problem List   Diagnosis Date Noted  . Elevated LFTs 08/26/2014  . S/P AVR (aortic valve replacement) 11/22/2013  . Aortic stenosis, severe 11/19/2013  . Aortic stenosis 09/03/2013  . Hyperlipidemia 09/03/2013  . Weight loss 09/03/2013  . Coronary artery disease 09/03/2013  . Hepatitis C 05/29/2013  . Unspecified gastritis and gastroduodenitis without mention of hemorrhage 04/24/2013  . Iron deficiency anemia due to chronic blood loss 04/23/2013  . Hypertension   . Diabetes mellitus without complication ([place])   . Shortness of breath 04/22/2013    Mikle Bosworth PTA 06/13/2016, 11:03 AM  [facility] [address] [place], [place], [address] Phone: [phone]   Fax:  [phone]  Name: MORIAH LOUGHRY MRN: [id number] Date of Birth: Feb 10, 1941

## 2016-06-15 ENCOUNTER — Ambulatory Visit: Payer: Medicare Other | Admitting: Physical Therapy

## 2016-06-15 ENCOUNTER — Encounter: Payer: Self-pay | Admitting: Physical Therapy

## 2016-06-15 DIAGNOSIS — M6281 Muscle weakness (generalized): Secondary | ICD-10-CM

## 2016-06-15 DIAGNOSIS — M25612 Stiffness of left shoulder, not elsewhere classified: Secondary | ICD-10-CM | POA: Diagnosis not present

## 2016-06-15 DIAGNOSIS — G8929 Other chronic pain: Secondary | ICD-10-CM

## 2016-06-15 DIAGNOSIS — M25512 Pain in left shoulder: Secondary | ICD-10-CM

## 2016-06-15 NOTE — Therapy (Signed)
[facility] [address] [place], [place], [address] Phone: [phone]   Fax:  [phone]  Physical Therapy Treatment  Patient Details  Name: TALOR CHEEMA MRN: [id number] Date of Birth: Apr 20, 1941 Referring Provider: Dr. Lujean Amel  Encounter Date: 06/15/2016      PT End of Session - 06/15/16 1031    Visit Number 19   Number of Visits 20   Date for PT Re-Evaluation 06/16/16   Authorization Type medicare g-code 20th visit   PT Start Time 1017   PT Stop Time 1102   PT Time Calculation (min) 45 min   Activity Tolerance Patient tolerated treatment well   Behavior During Therapy [facility] for tasks assessed/performed      Past Medical History:  Diagnosis Date  . Acute epididymo-orchitis   . Aortic stenosis    s/p bioprosthetic AVR (Dr. Cyndia Bent) 11/2013  . Arthritis   . Diabetes mellitus without complication ([place])   . ED (erectile dysfunction)   . Essential hypertension, benign   . Heart murmur   . Hep C w/o coma, chronic ([place])   . History of blood transfusion   . History of DVT of lower extremity   . Hx of echocardiogram    Echo (8/15):  Severe LVH, EF 60-65%, no RWMA, Gr 3 DD, AVR ok (mean 21 mmHg), MAC, mild LAE, mild to mod RAE  . Hypercholesterolemia     Past Surgical History:  Procedure Laterality Date  . AORTIC VALVE REPLACEMENT N/A 11/19/2013   Procedure: AORTIC VALVE REPLACEMENT (AVR);  Surgeon: Gaye Pollack, MD;  Location: [place];  Service: Open Heart Surgery;  Laterality: N/A;  . BUNIONECTOMY Bilateral 2008  . CARDIAC CATHETERIZATION  10/01/13  . COLONOSCOPY  01/2010   internal hemorrhoids.  Dr Deatra Ina  . ESOPHAGOGASTRODUODENOSCOPY N/A 04/24/2013   Procedure: ESOPHAGOGASTRODUODENOSCOPY (EGD);  Surgeon: Jerene Bears, MD;  Location: [facility] ENDOSCOPY;  Service: Endoscopy;  Laterality: N/A;  . INGUINAL HERNIA REPAIR Right   . INTRAOPERATIVE TRANSESOPHAGEAL ECHOCARDIOGRAM N/A 11/19/2013   Procedure:  INTRAOPERATIVE TRANSESOPHAGEAL ECHOCARDIOGRAM;  Surgeon: Gaye Pollack, MD;  [facility] OR;  Service: Open Heart Surgery;  Laterality: N/A;  . KNEE ARTHROSCOPY Left 05/2009   Dr Collier Salina  . LAMINECTOMY  06/2005   Dr Joya Salm. multiple lumbar level laminectomies.   Marland Kitchen LEFT AND RIGHT HEART CATHETERIZATION WITH CORONARY ANGIOGRAM N/A 10/01/2013   Procedure: LEFT AND RIGHT HEART CATHETERIZATION WITH CORONARY ANGIOGRAM;  Surgeon: Candee Furbish, MD;  Location: [facility] CATH LAB;  Service: Cardiovascular;  Laterality: N/A;  . TEE WITHOUT CARDIOVERSION  11/2003   Aoritic valve sclerosis.   . TONSILLECTOMY      There were no vitals filed for this visit.      Subjective Assessment - 06/15/16 1024    Subjective Pt states pecs on Lt is sore when touching.  States doing the exercises help when having pain.   Currently in Pain? No/denies            [facility] PT Assessment - 06/15/16 0001      Observation/Other Assessments   Focus on Therapeutic Outcomes (FOTO)  54% limitation     PROM   Left Shoulder Flexion 120 Degrees   Left Shoulder ABduction 100 Degrees   Left Shoulder Internal Rotation 45 Degrees   Left Shoulder External Rotation 55 Degrees     Strength   Left Shoulder Flexion 4-/5   Left Shoulder Extension 5/5   Left Shoulder ABduction 3+/5   Left Shoulder  Internal Rotation 5/5   Left Shoulder External Rotation 5/5                     [facility] Adult PT Treatment/Exercise - 06/15/16 0001      Shoulder Exercises: Prone   Extension Strengthening;Left;15 reps  #3   Horizontal ABduction 1 Strengthening;Left;Weights  30, in pain free range, small movement   Horizontal ABduction 1 Weight (lbs) 1   Other Prone Exercises rows 3lb - 30x     Shoulder Exercises: ROM/Strengthening   UBE (Upper Arm Bike) L3, 3x3  Therapist present to discuss treatment   Ball on Wall push up on physioball - 20x   Rhythmic Stabilization, Supine weighted ball side to side and up and down on wall - 25x  each way     Shoulder Exercises: Power Development worker, community 20 reps  #25 - 2 sets   Row 20 reps  #25  - 2 sets   Other Power Tower Exercises Lat pull down pull to chest - 3 plates  2 sets of 20 reps     Manual Therapy   Passive ROM Lt shoulder all planes                  PT Short Term Goals - 06/15/16 1045      PT SHORT TERM GOAL #1   Title independent with initial HEP   Time 4   Period Weeks   Status Achieved     PT SHORT TERM GOAL #2   Title left shoulder pain decreased >/= 25% with daily activities.    Time 4   Period Weeks     PT SHORT TERM GOAL #3   Title ability to reach behind his back with >/= 25% greater ease due to increase left shoulder ROM   Time 4   Period Weeks   Status Achieved     PT SHORT TERM GOAL #4   Title left shoulder strength >/= 3/5 making daily activities easier   Time 4   Period Weeks   Status Achieved           PT Long Term Goals - 06/15/16 1047      PT LONG TERM GOAL #1   Title independent with HEP   Time 8   Period Weeks   Status Achieved     PT LONG TERM GOAL #2   Title increase left shoulder internal rotation so he can wash his back independently   Time 8   Period Weeks   Status Not Met     PT LONG TERM GOAL #3   Title increased left shoulder flexion strength >/= 4/5 so he can hammer stakes in ground to plant vegetables   Time 8   Period Weeks   Status Not Met     PT LONG TERM GOAL #4   Title reach overhead with left arm to get an item in overhead cabinet due to AROM flexion >/= 130 degrees   Time 8   Period Weeks   Status Not Met     PT LONG TERM GOAL #5   Title FOTO score is </= 33% limitation    Time 8   Period Weeks   Status Not Met     PT LONG TERM GOAL #6   Title pain with left shoulder during daily activities decreased >/=75%   Time 5   Period Weeks   Status Not Met  Plan - 2016-06-27 1056    Clinical Impression Statement patient is able to correctly perform exercises  and is able to return to most activities.  Pt reports overall less pain and improved ability to reach. States the exercises help when he has increased pain. Pt has not made noticeable progress for several visitis and will be discharged with HEP at this time.   Rehab Potential Excellent   Clinical Impairments Affecting Rehab Potential None   PT Treatment/Interventions Electrical Stimulation;Iontophoresis 73m/ml Dexamethasone;Moist Heat;Ultrasound;Patient/family education;Neuromuscular re-education;Therapeutic exercise;Therapeutic activities;Manual techniques;Passive range of motion;Taping;Dry needling   PT Next Visit Plan discharge with HEP   Consulted and Agree with Plan of Care Patient      Patient will benefit from skilled therapeutic intervention in order to improve the following deficits and impairments:  Pain, Decreased mobility, Decreased strength, Impaired flexibility, Decreased activity tolerance, Decreased endurance, Increased muscle spasms, Impaired UE functional use, Decreased range of motion  Visit Diagnosis: Muscle weakness (generalized)  Chronic left shoulder pain  Stiffness of left shoulder, not elsewhere classified       G-Codes - 02018/02/191131    Functional Assessment Tool Used FOTO score 54% limitation and clinical impression   Functional Limitation Other PT primary   Other PT Primary Current Status ((I9518 At least 40 percent but less than 60 percent impaired, limited or restricted   Other PT Primary Goal Status ((A4166 At least 20 percent but less than 40 percent impaired, limited or restricted   Other PT Primary Discharge Status ((A6301 At least 40 percent but less than 60 percent impaired, limited or restricted      Problem List Patient Active Problem List   Diagnosis Date Noted  . Elevated LFTs 08/26/2014  . S/P AVR (aortic valve replacement) 11/22/2013  . Aortic stenosis, severe 11/19/2013  . Aortic stenosis 09/03/2013  . Hyperlipidemia 09/03/2013  .  Weight loss 09/03/2013  . Coronary artery disease 09/03/2013  . Hepatitis C 05/29/2013  . Unspecified gastritis and gastroduodenitis without mention of hemorrhage 04/24/2013  . Iron deficiency anemia due to chronic blood loss 04/23/2013  . Hypertension   . [place]   . Shortness of breath 04/22/2013    JZannie Cove PT 2February 19, 2018 11:44 AM  [facility] [address] [place] [place] [address]Phone: [phone]  Fax:  [phone] Name: LALBIN DUCKETTMRN: [id number] of Birth: [birth year] PHYSICAL THERAPY DISCHARGE SUMMARY  Visits from Start of Care: 19  Current functional level related to goals / functional outcomes: See above note   Remaining deficits: See above note   Education / Equipment: HEP  Plan: Patient agrees to discharge.  Patient goals were not met. Patient is being discharged due to lack of progress.  ?????

## 2016-08-10 DIAGNOSIS — B182 Chronic viral hepatitis C: Secondary | ICD-10-CM | POA: Diagnosis not present

## 2016-08-10 DIAGNOSIS — N281 Cyst of kidney, acquired: Secondary | ICD-10-CM | POA: Diagnosis not present

## 2016-08-10 DIAGNOSIS — K7460 Unspecified cirrhosis of liver: Secondary | ICD-10-CM | POA: Diagnosis not present

## 2016-10-11 DIAGNOSIS — M899 Disorder of bone, unspecified: Secondary | ICD-10-CM | POA: Diagnosis not present

## 2016-10-11 DIAGNOSIS — K740 Hepatic fibrosis: Secondary | ICD-10-CM | POA: Diagnosis not present

## 2016-10-11 DIAGNOSIS — K7460 Unspecified cirrhosis of liver: Secondary | ICD-10-CM | POA: Diagnosis not present

## 2016-10-11 DIAGNOSIS — Z13820 Encounter for screening for osteoporosis: Secondary | ICD-10-CM | POA: Diagnosis not present

## 2016-10-18 DIAGNOSIS — N5201 Erectile dysfunction due to arterial insufficiency: Secondary | ICD-10-CM | POA: Diagnosis not present

## 2016-10-18 DIAGNOSIS — E119 Type 2 diabetes mellitus without complications: Secondary | ICD-10-CM | POA: Diagnosis not present

## 2016-10-18 DIAGNOSIS — I10 Essential (primary) hypertension: Secondary | ICD-10-CM | POA: Diagnosis not present

## 2016-10-18 DIAGNOSIS — Z0000 Encounter for general adult medical examination without abnormal findings: Secondary | ICD-10-CM | POA: Diagnosis not present

## 2016-10-18 DIAGNOSIS — D649 Anemia, unspecified: Secondary | ICD-10-CM | POA: Diagnosis not present

## 2016-10-18 DIAGNOSIS — Z952 Presence of prosthetic heart valve: Secondary | ICD-10-CM | POA: Diagnosis not present

## 2016-10-18 DIAGNOSIS — E7800 Pure hypercholesterolemia, unspecified: Secondary | ICD-10-CM | POA: Diagnosis not present

## 2016-10-18 DIAGNOSIS — Z79899 Other long term (current) drug therapy: Secondary | ICD-10-CM | POA: Diagnosis not present

## 2016-11-03 ENCOUNTER — Ambulatory Visit (INDEPENDENT_AMBULATORY_CARE_PROVIDER_SITE_OTHER): Payer: Medicare Other | Admitting: Cardiology

## 2016-11-03 ENCOUNTER — Encounter: Payer: Self-pay | Admitting: Cardiology

## 2016-11-03 VITALS — BP 138/62 | HR 56 | Ht 71.0 in | Wt 144.4 lb

## 2016-11-03 DIAGNOSIS — R011 Cardiac murmur, unspecified: Secondary | ICD-10-CM | POA: Diagnosis not present

## 2016-11-03 DIAGNOSIS — E782 Mixed hyperlipidemia: Secondary | ICD-10-CM

## 2016-11-03 DIAGNOSIS — E119 Type 2 diabetes mellitus without complications: Secondary | ICD-10-CM | POA: Diagnosis not present

## 2016-11-03 DIAGNOSIS — Z952 Presence of prosthetic heart valve: Secondary | ICD-10-CM

## 2016-11-03 DIAGNOSIS — I10 Essential (primary) hypertension: Secondary | ICD-10-CM | POA: Diagnosis not present

## 2016-11-03 NOTE — Patient Instructions (Signed)
Medication Instructions:  Your physician recommends that you continue on your current medications as directed. Please refer to the Current Medication list given to you today.   Labwork: NONE ORDRED  Testing/Procedures: Your physician has requested that you have an echocardiogram. Echocardiography is a painless test that uses sound waves to create images of your heart. It provides your doctor with information about the size and shape of your heart and how well your heart's chambers and valves are working. This procedure takes approximately one hour. There are no restrictions for this procedure.    Follow-Up: Your physician wants you to follow-up in: 6 MONTHS WITH DR. Marlou Porch  You will receive a reminder letter in the mail two months in advance. If you don't receive a letter, please call our office to schedule the follow-up appointment.   Any Other Special Instructions Will Be Listed Below (If Applicable).     If you need a refill on your cardiac medications before your next appointment, please call your pharmacy.

## 2016-11-03 NOTE — Progress Notes (Signed)
Cardiology Office Note   Date:  11/03/2016   ID:  [name], DOB 01/22/1941, MRN [id number]  PCP:  Lujean Amel, MD  Cardiologist:  Dr. Marlou Porch    Chief Complaint  Patient presents with  . Aortic Stenosis      History of Present Illness: [name] is a 76 y.o. male who presents for AVR follow up.   He has a hx. Of severe aortic stenosis having undergone aortic valve replacement, bioprosthetic valve on 08/16/79, uncomplicated course.  Also hx of DM-2, HTN, hld, hepatitis C and minor irregularities on cardiac cath prior to AVR.  mild bil. Carotid disease.  Elevated ALt.   Last seen by Dr. Marlou Porch in 08/2014.  Last echo 12/2013 with EF 60-65%, G3 DD, aortic bioprosthesis valve present.    Today pt without complaints.  No chest pain and no SOB.  No edema.  He is feeling well.  Is active.  His diabetes is controlled.  BP stable. Tries to eat healthy.    Past Medical History:  Diagnosis Date  . Acute epididymo-orchitis   . Aortic stenosis    s/p bioprosthetic AVR (Dr. Cyndia Bent) 11/2013  . Arthritis   . Diabetes mellitus without complication ([place])   . ED (erectile dysfunction)   . Essential hypertension, benign   . Heart murmur   . Hep C w/o coma, chronic ([place])   . History of blood transfusion   . History of DVT of lower extremity   . Hx of echocardiogram    Echo (8/15):  Severe LVH, EF 60-65%, no RWMA, Gr 3 DD, AVR ok (mean 21 mmHg), MAC, mild LAE, mild to mod RAE  . Hypercholesterolemia     Past Surgical History:  Procedure Laterality Date  . AORTIC VALVE REPLACEMENT N/A 11/19/2013   Procedure: AORTIC VALVE REPLACEMENT (AVR);  Surgeon: Gaye Pollack, MD;  Location: [place];  Service: Open Heart Surgery;  Laterality: N/A;  . BUNIONECTOMY Bilateral 2008  . CARDIAC CATHETERIZATION  10/01/13  . COLONOSCOPY  01/2010   internal hemorrhoids.  Dr Deatra Ina  . ESOPHAGOGASTRODUODENOSCOPY N/A 04/24/2013   Procedure: ESOPHAGOGASTRODUODENOSCOPY (EGD);  Surgeon: Jerene Bears, MD;   Location[facility] ENDOSCOPY;  Service: Endoscopy;  Laterality: N/A;  . INGUINAL HERNIA REPAIR Right   . INTRAOPERATIVE TRANSESOPHAGEAL ECHOCARDIOGRAM N/A 11/19/2013   Procedure: INTRAOPERATIVE TRANSESOPHAGEAL ECHOCARDIOGRAM;  Surgeon: Gaye Pollack, MD;  Location: [facility] OR;  Service: Open Heart Surgery;  Laterality: N/A;  . KNEE ARTHROSCOPY Left 05/2009   Dr Collier Salina  . LAMINECTOMY  06/2005   Dr Joya Salm. multiple lumbar level laminectomies.   Marland Kitchen LEFT AND RIGHT HEART CATHETERIZATION WITH CORONARY ANGIOGRAM N/A 10/01/2013   Procedure: LEFT AND RIGHT HEART CATHETERIZATION WITH CORONARY ANGIOGRAM;  Surgeon: Candee Furbish, MD;  Location: [facility] CATH LAB;  Service: Cardiovascular;  Laterality: N/A;  . TEE WITHOUT CARDIOVERSION  11/2003   Aoritic valve sclerosis.   . TONSILLECTOMY       Current Outpatient Prescriptions  Medication Sig Dispense Refill  . acetaminophen (TYLENOL) 500 MG tablet Take 500 mg by mouth every 6 (six) hours as needed for mild pain or moderate pain.    Marland Kitchen amLODipine (NORVASC) 10 MG tablet Take 10 mg by mouth daily.  0  . aspirin EC 81 MG EC tablet Take 1 tablet (81 mg total) by mouth daily.    Marland Kitchen atorvastatin (LIPITOR) 20 MG tablet Take 20 mg by mouth at bedtime.    Marland Kitchen CIALIS 20 MG tablet Take 1 tablet by mouth  as needed. For erectile dysfunction  0  . ferrous gluconate (FERGON) 324 MG tablet Take 1 tablet (324 mg total) by mouth daily with breakfast. 30 tablet 3  . lisinopril-hydrochlorothiazide (PRINZIDE,ZESTORETIC) 20-25 MG per tablet Take 1 tablet by mouth daily.    . metFORMIN (GLUCOPHAGE) 500 MG tablet Take 500 mg by mouth 2 (two) times daily with a meal. Take 546m by mouth twice daily    . metoprolol tartrate (LOPRESSOR) 25 MG tablet take 1/2 tablet by mouth twice a day 30 tablet 5   No current facility-administered medications for this visit.     Allergies:   Crestor [rosuvastatin] and Vytorin [ezetimibe-simvastatin]    Social History:  The patient  reports that he quit smoking  about 30 years ago. His smoking use included Cigarettes. He has never used smokeless tobacco. He reports that he does not drink alcohol or use drugs.   Family History:  The patient's family history includes Diabetes in his father and mother; Stroke in his father and mother.    ROS:  General:no colds or fevers, no weight changes Skin:no rashes or ulcers HEENT:no blurred vision, no congestion CV:see HPI PUL:see HPI GI:no diarrhea constipation or melena, no indigestion GU:no hematuria, no dysuria MS:no joint pain, no claudication Neuro:no syncope, no lightheadedness Endo:+ diabetes, no thyroid disease  Wt Readings from Last 3 Encounters:  11/03/16 144 lb 6.4 oz (65.5 kg)  08/26/14 146 lb 1.9 oz (66.3 kg)  02/27/14 144 lb (65.3 kg)     PHYSICAL EXAM: VS:  BP 138/62   Pulse (!) 56   Ht _0  (1.803 m)   Wt 144 lb 6.4 oz (65.5 kg)   BMI 20.14 kg/m  , BMI Body mass index is 20.14 kg/m. General:Pleasant affect, NAD Skin:Warm and dry, brisk capillary refill HEENT:normocephalic, sclera clear, mucus membranes moist Neck:supple, no JVD, no bruits  Heart:S1S2 RRR wZOXW9/6systolic murmur, no gallup, rub or click Lungs:clear without rales, rhonchi, or wheezes AEAV:WUJW non tender, + BS, do not palpate liver spleen or masses Ext:no lower ext edema, 2+ pedal pulses, 2+ radial pulses Neuro:alert and oriented X 3, MAE, follows commands, + facial symmetry    EKG:  EKG is ordered today. The ekg ordered today demonstrates SB at 550no acute changes otherwise.     Recent Labs: No results found for requested labs within last 8760 hours.    Lipid Panel No results found for: CHOL, TRIG, HDL, CHOLHDL, VLDL, LDLCALC, LDLDIRECT     Other studies Reviewed: Additional studies/ records that were reviewed today include: . Echo 12/26/13  Study Conclusions  - Left ventricle: Wall thickness was increased in a pattern of severe LVH. Systolic function was normal. The estimated  ejection fraction was in the range of 60% to 65%. Wall motion was normal; there were no regional wall motion abnormalities. Doppler parameters are consistent with a reversible restrictive pattern, indicative of decreased left ventricular diastolic compliance and/or increased left atrial pressure (grade 3 diastolic dysfunction). - Aortic valve: A bioprosthesis was present. - Mitral valve: Moderately calcified annulus. Mildly thickened, mildly calcified leaflets . - Left atrium: The atrium was mildly dilated. - Right atrium: The atrium was mildly to moderately dilated.  ASSESSMENT AND PLAN:  1. Aortic stenosis with bioprosthetic AVR in 2015. No SOB but murmur is present.  Will repeat Echo.  If stable will follow up with Dr. SMarlou Porchin 6 months.   2. HLD followed by PCP  3. DM -2 followed by PCP.   4. HTN  stable    Current medicines are reviewed with the patient today.  The patient Has no concerns regarding medicines.  The following changes have been made:  See above Labs/ tests ordered today include:see above  Disposition:   FU:  see above  Signed, Cecilie Kicks, NP  11/03/2016 5:49 PM    [facility], [place], [place], [place] Phone: [phone]; Fax: [phone]

## 2016-11-14 ENCOUNTER — Ambulatory Visit (INDEPENDENT_AMBULATORY_CARE_PROVIDER_SITE_OTHER): Payer: Medicare Other | Admitting: Podiatry

## 2016-11-14 ENCOUNTER — Encounter: Payer: Self-pay | Admitting: Podiatry

## 2016-11-14 VITALS — BP 159/77 | HR 55 | Resp 16

## 2016-11-14 DIAGNOSIS — E1149 Type 2 diabetes mellitus with other diabetic neurological complication: Secondary | ICD-10-CM | POA: Diagnosis not present

## 2016-11-14 DIAGNOSIS — E1140 Type 2 diabetes mellitus with diabetic neuropathy, unspecified: Secondary | ICD-10-CM | POA: Diagnosis not present

## 2016-11-14 DIAGNOSIS — Q828 Other specified congenital malformations of skin: Secondary | ICD-10-CM

## 2016-11-15 NOTE — Progress Notes (Signed)
Subjective:    Patient ID: [name], male   DOB: 76 y.o.   MRN: [id number]   HPI patient presents with severe keratotic lesion plantar aspect bilateral    ROS      Objective:  Physical Exam neurovascular status intact with keratotic lesion plantar bilateral     Assessment:   Chronic lesion formation bilateral      Plan:     Debridement of painful lesions and patient with diabetes with diminished neurological sensation with no iatrogenic bleeding noted

## 2016-11-17 ENCOUNTER — Ambulatory Visit (HOSPITAL_COMMUNITY): Payer: Medicare Other | Attending: Cardiovascular Disease

## 2016-11-17 ENCOUNTER — Other Ambulatory Visit: Payer: Self-pay

## 2016-11-17 DIAGNOSIS — I517 Cardiomegaly: Secondary | ICD-10-CM | POA: Insufficient documentation

## 2016-11-17 DIAGNOSIS — Z9889 Other specified postprocedural states: Secondary | ICD-10-CM | POA: Diagnosis not present

## 2016-11-17 DIAGNOSIS — R011 Cardiac murmur, unspecified: Secondary | ICD-10-CM | POA: Diagnosis not present

## 2016-11-17 DIAGNOSIS — Z952 Presence of prosthetic heart valve: Secondary | ICD-10-CM | POA: Insufficient documentation

## 2016-12-28 DIAGNOSIS — K7460 Unspecified cirrhosis of liver: Secondary | ICD-10-CM | POA: Diagnosis not present

## 2016-12-28 DIAGNOSIS — Z13820 Encounter for screening for osteoporosis: Secondary | ICD-10-CM | POA: Diagnosis not present

## 2016-12-28 DIAGNOSIS — N281 Cyst of kidney, acquired: Secondary | ICD-10-CM | POA: Diagnosis not present

## 2016-12-28 DIAGNOSIS — M899 Disorder of bone, unspecified: Secondary | ICD-10-CM | POA: Diagnosis not present

## 2016-12-28 DIAGNOSIS — K769 Liver disease, unspecified: Secondary | ICD-10-CM | POA: Diagnosis not present

## 2016-12-30 DIAGNOSIS — K769 Liver disease, unspecified: Secondary | ICD-10-CM | POA: Diagnosis not present

## 2016-12-30 DIAGNOSIS — R932 Abnormal findings on diagnostic imaging of liver and biliary tract: Secondary | ICD-10-CM | POA: Diagnosis not present

## 2017-01-04 DIAGNOSIS — R932 Abnormal findings on diagnostic imaging of liver and biliary tract: Secondary | ICD-10-CM | POA: Diagnosis not present

## 2017-01-04 DIAGNOSIS — B1920 Unspecified viral hepatitis C without hepatic coma: Secondary | ICD-10-CM | POA: Diagnosis not present

## 2017-01-04 DIAGNOSIS — K769 Liver disease, unspecified: Secondary | ICD-10-CM | POA: Diagnosis not present

## 2017-02-03 DIAGNOSIS — Z23 Encounter for immunization: Secondary | ICD-10-CM | POA: Diagnosis not present

## 2017-04-11 DIAGNOSIS — E119 Type 2 diabetes mellitus without complications: Secondary | ICD-10-CM | POA: Diagnosis not present

## 2017-04-11 DIAGNOSIS — I350 Nonrheumatic aortic (valve) stenosis: Secondary | ICD-10-CM | POA: Diagnosis not present

## 2017-04-11 DIAGNOSIS — B182 Chronic viral hepatitis C: Secondary | ICD-10-CM | POA: Diagnosis not present

## 2017-04-11 DIAGNOSIS — E559 Vitamin D deficiency, unspecified: Secondary | ICD-10-CM | POA: Diagnosis not present

## 2017-04-11 DIAGNOSIS — K7460 Unspecified cirrhosis of liver: Secondary | ICD-10-CM | POA: Diagnosis not present

## 2017-04-19 DIAGNOSIS — I10 Essential (primary) hypertension: Secondary | ICD-10-CM | POA: Diagnosis not present

## 2017-04-19 DIAGNOSIS — Z79899 Other long term (current) drug therapy: Secondary | ICD-10-CM | POA: Diagnosis not present

## 2017-04-19 DIAGNOSIS — Z7984 Long term (current) use of oral hypoglycemic drugs: Secondary | ICD-10-CM | POA: Diagnosis not present

## 2017-04-19 DIAGNOSIS — Z952 Presence of prosthetic heart valve: Secondary | ICD-10-CM | POA: Diagnosis not present

## 2017-04-19 DIAGNOSIS — E119 Type 2 diabetes mellitus without complications: Secondary | ICD-10-CM | POA: Diagnosis not present

## 2017-04-19 DIAGNOSIS — E7800 Pure hypercholesterolemia, unspecified: Secondary | ICD-10-CM | POA: Diagnosis not present

## 2017-04-28 ENCOUNTER — Ambulatory Visit (INDEPENDENT_AMBULATORY_CARE_PROVIDER_SITE_OTHER): Payer: Medicare Other | Admitting: Cardiology

## 2017-04-28 ENCOUNTER — Encounter: Payer: Self-pay | Admitting: Cardiology

## 2017-04-28 VITALS — BP 130/70 | HR 64 | Ht 71.0 in | Wt 142.8 lb

## 2017-04-28 DIAGNOSIS — E782 Mixed hyperlipidemia: Secondary | ICD-10-CM | POA: Diagnosis not present

## 2017-04-28 DIAGNOSIS — I10 Essential (primary) hypertension: Secondary | ICD-10-CM

## 2017-04-28 DIAGNOSIS — E119 Type 2 diabetes mellitus without complications: Secondary | ICD-10-CM

## 2017-04-28 DIAGNOSIS — Z952 Presence of prosthetic heart valve: Secondary | ICD-10-CM

## 2017-04-28 NOTE — Progress Notes (Signed)
[facility] [facility]. [address], [place], [place]  [address] Phone: [phone] Fax:  [phone]  Date:  04/28/2017   ID:  [name], DOB 1941/01/12, MRN [id number]  PCP:  Lujean Amel, MD   History of Present Illness: [name] is a 76 y.o. male with prior severe aortic stenosis having undergone aortic valve replacement, bioprosthetic valve on 4/94/49, uncomplicated course. Denies any chest pain, shortness of breath, fevers, chills. Cardiac rehabilitation has been taking place. Occasionally when he wakes up at night he will rub his chest wall and he wonders if this is psychological. No severe chest pain.  Other comorbidities include diabetes, hypertension, hyperlipidemia, hepatitis C as well as coronary artery disease (minor luminal irregularities on cardiac catheterization) and prior GI bleed.  Ejection fraction was normal prior to surgery. Mild bilateral carotid artery disease July of 2015 noted on Doppler.  ALT was 102 on 11/14/13.  08/26/14-overall doing very well, no shortness of breath, no chest pain, no syncope. Aware of dental antibiotics.  04/28/17-no chest pain fevers chills nausea vomiting syncope.  Is hard for him to gain weight.  Overall doing well.  Also once again aware of dental antibiotics.  Wt Readings from Last 3 Encounters:  04/28/17 142 lb 12.8 oz (64.8 kg)  11/03/16 144 lb 6.4 oz (65.5 kg)  08/26/14 146 lb 1.9 oz (66.3 kg)     Past Medical History:  Diagnosis Date  . Acute epididymo-orchitis   . Aortic stenosis    s/p bioprosthetic AVR (Dr. Cyndia Bent) 11/2013  . Arthritis   . Diabetes mellitus without complication ([place])   . ED (erectile dysfunction)   . Essential hypertension, benign   . Heart murmur   . Hep C w/o coma, chronic ([place])   . History of blood transfusion   . History of DVT of lower extremity   . Hx of echocardiogram    Echo (8/15):  Severe LVH, EF 60-65%, no RWMA, Gr 3 DD, AVR ok (mean 21 mmHg), MAC, mild LAE, mild to mod RAE  .  Hypercholesterolemia     Past Surgical History:  Procedure Laterality Date  . AORTIC VALVE REPLACEMENT N/A 11/19/2013   Procedure: AORTIC VALVE REPLACEMENT (AVR);  Surgeon: Gaye Pollack, MD;  Location: [place];  Service: Open Heart Surgery;  Laterality: N/A;  . BUNIONECTOMY Bilateral 2008  . CARDIAC CATHETERIZATION  10/01/13  . COLONOSCOPY  01/2010   internal hemorrhoids.  Dr Deatra Ina  . ESOPHAGOGASTRODUODENOSCOPY N/A 04/24/2013   Procedure: ESOPHAGOGASTRODUODENOSCOPY (EGD);  Surgeon: Jerene Bears, MD;  Location[facility] ENDOSCOPY;  Service: Endoscopy;  Laterality: N/A;  . INGUINAL HERNIA REPAIR Right   . INTRAOPERATIVE TRANSESOPHAGEAL ECHOCARDIOGRAM N/A 11/19/2013   Procedure: INTRAOPERATIVE TRANSESOPHAGEAL ECHOCARDIOGRAM;  Surgeon: Gaye Pollack, MD;  Location: [facility] OR;  Service: Open Heart Surgery;  Laterality: N/A;  . KNEE ARTHROSCOPY Left 05/2009   Dr Collier Salina  . LAMINECTOMY  06/2005   Dr Joya Salm. multiple lumbar level laminectomies.   Marland Kitchen LEFT AND RIGHT HEART CATHETERIZATION WITH CORONARY ANGIOGRAM N/A 10/01/2013   Procedure: LEFT AND RIGHT HEART CATHETERIZATION WITH CORONARY ANGIOGRAM;  Surgeon: Candee Furbish, MD;  Location: [facility] CATH LAB;  Service: Cardiovascular;  Laterality: N/A;  . TEE WITHOUT CARDIOVERSION  11/2003   Aoritic valve sclerosis.   . TONSILLECTOMY      Current Outpatient Medications  Medication Sig Dispense Refill  . acetaminophen (TYLENOL) 500 MG tablet Take 500 mg by mouth every 6 (six) hours as needed for mild pain  or moderate pain.    Marland Kitchen amLODipine (NORVASC) 10 MG tablet Take 10 mg by mouth daily.  0  . aspirin EC 81 MG EC tablet Take 1 tablet (81 mg total) by mouth daily.    Marland Kitchen atorvastatin (LIPITOR) 20 MG tablet Take 20 mg by mouth at bedtime.    Marland Kitchen CIALIS 20 MG tablet Take 1 tablet by mouth as needed. For erectile dysfunction  0  . ferrous gluconate (FERGON) 324 MG tablet Take 1 tablet (324 mg total) by mouth daily with breakfast. 30 tablet 3  . FLUZONE HIGH-DOSE 0.5 ML  injection Inject as directed once.  0  . lisinopril-hydrochlorothiazide (PRINZIDE,ZESTORETIC) 20-25 MG per tablet Take 1 tablet by mouth daily.    . metFORMIN (GLUCOPHAGE) 500 MG tablet Take 500 mg by mouth 2 (two) times daily with a meal. Take 514m by mouth twice daily    . metoprolol tartrate (LOPRESSOR) 25 MG tablet take 1/2 tablet by mouth twice a day 30 tablet 5   No current facility-administered medications for this visit.     Allergies:    Allergies  Allergen Reactions  . Crestor [Rosuvastatin]     Discomfort, RESTLESS, CAN'T SLEEP Discomfort, RESTLESS, CAN'T SLEEP Discomfort, RESTLESS, CAN'T SLEEP   . Vytorin [Ezetimibe-Simvastatin]     Leg pains Restlessness at night      Social History:  The patient  reports that he quit smoking about 30 years ago. His smoking use included cigarettes. he has never used smokeless tobacco. He reports that he does not drink alcohol or use drugs.   Family History  Problem Relation Age of Onset  . Diabetes Father   . Stroke Father   . Diabetes Mother   . Stroke Mother   . Hypertension Unknown        family history  . Heart attack Neg Hx     ROS:  Please see the history of present illness.   Denies any fevers, chills, orthopnea, PND, syncope   All other systems reviewed and negative.   PHYSICAL EXAM: VS:  BP 130/70   Pulse 64   Ht _0  (1.803 m)   Wt 142 lb 12.8 oz (64.8 kg)   SpO2 97%   BMI 19.92 kg/m  GEN: Thin, in no acute distress  HEENT: normal  Neck: no JVD, carotid bruits, or masses Cardiac: RRR; 1/6 SM, no rubs, or gallops,no edema  Respiratory:  clear to auscultation bilaterally, normal work of breathing GI: soft, nontender, nondistended, + BS MS: no deformity or atrophy  Skin: warm and dry, no rash Neuro:  Alert and Oriented x 3, Strength and sensation are intact Psych: euthymic mood, full affect   EKG:  None today Labs: ALT 102 on 11/14/13 postoperatively, most recent value is 9,   Has had subsequent blood  work at Dr. KDorthy Cooler LDL 81 triglycerides 79 hemoglobin A1c 6.7 creatinine 0.9  ASSESSMENT AND PLAN:  1. History of severe aortic stenosis status post aortic valve replacement, bioprosthetic-doing very well.  Continue with protein.  Hard for him to gain weight. 2. Postoperative echocardiogram excellent. Normal valvular function. Okay to participate in sexual activity. 3. Hypertension-doing well, well-controlled, no changes made today. 4. Hyperlipidemia-doing well with atorvastatin.  Lab work reviewed. 5. Diabetes with hypertension- hemoglobin A1c 6.7.  Metformin. 6. We will see him back in 12 months.  Signed, MCandee Furbish MD [facility] 04/28/2017 9:10 AM

## 2017-04-28 NOTE — Patient Instructions (Signed)
Medication Instructions:  Your physician recommends that you continue on your current medications as directed. Please refer to the Current Medication list given to you today.   Labwork: NONE ORDERED TODAY  Testing/Procedures: NONE ORDERED TODAY  Follow-Up: Your physician wants you to follow-up in: [place] will receive a reminder letter in the mail two months in advance. If you don't receive a letter, please call our office to schedule the follow-up appointment.   Any Other Special Instructions Will Be Listed Below (If Applicable).     If you need a refill on your cardiac medications before your next appointment, please call your pharmacy.

## 2017-05-15 ENCOUNTER — Encounter: Payer: Self-pay | Admitting: Podiatry

## 2017-05-15 ENCOUNTER — Ambulatory Visit (INDEPENDENT_AMBULATORY_CARE_PROVIDER_SITE_OTHER): Payer: Medicare Other | Admitting: Podiatry

## 2017-05-15 DIAGNOSIS — E1149 Type 2 diabetes mellitus with other diabetic neurological complication: Secondary | ICD-10-CM

## 2017-05-15 DIAGNOSIS — E1140 Type 2 diabetes mellitus with diabetic neuropathy, unspecified: Secondary | ICD-10-CM

## 2017-05-15 DIAGNOSIS — M2042 Other hammer toe(s) (acquired), left foot: Secondary | ICD-10-CM | POA: Diagnosis not present

## 2017-05-15 DIAGNOSIS — Q828 Other specified congenital malformations of skin: Secondary | ICD-10-CM

## 2017-05-15 NOTE — Progress Notes (Signed)
Subjective:   Patient ID: [name], male   DOB: 77 y.o.   MRN: [id number]   HPI Patient presents with painful lesion plantar aspect of both fifth and second metatarsal and second digits bilateral with elevated second toes bilateral   ROS      Objective:  Physical Exam  Neurovascular status intact with severe keratotic lesions x3 bilateral with 6 total lesions and digital deformities of the second digit bilateral with underlapping big toe left over right     Assessment:  Keratotic lesion formation with hammertoe deformity and plantarflexed metatarsals     Plan:  Conditions discussed at great length and debridement of all lesions accomplished discussed hammertoe correction which may be necessary if symptoms worsen.  Educated him on procedures

## 2017-07-12 DIAGNOSIS — K7460 Unspecified cirrhosis of liver: Secondary | ICD-10-CM | POA: Diagnosis not present

## 2017-08-10 DIAGNOSIS — E119 Type 2 diabetes mellitus without complications: Secondary | ICD-10-CM | POA: Diagnosis not present

## 2017-09-15 ENCOUNTER — Ambulatory Visit (INDEPENDENT_AMBULATORY_CARE_PROVIDER_SITE_OTHER): Payer: Medicare Other

## 2017-09-15 ENCOUNTER — Ambulatory Visit (INDEPENDENT_AMBULATORY_CARE_PROVIDER_SITE_OTHER): Payer: Medicare Other | Admitting: Podiatry

## 2017-09-15 ENCOUNTER — Other Ambulatory Visit: Payer: Self-pay | Admitting: Podiatry

## 2017-09-15 ENCOUNTER — Encounter: Payer: Self-pay | Admitting: Podiatry

## 2017-09-15 DIAGNOSIS — M779 Enthesopathy, unspecified: Secondary | ICD-10-CM | POA: Diagnosis not present

## 2017-09-15 DIAGNOSIS — M79671 Pain in right foot: Secondary | ICD-10-CM | POA: Diagnosis not present

## 2017-09-15 DIAGNOSIS — Q828 Other specified congenital malformations of skin: Secondary | ICD-10-CM | POA: Diagnosis not present

## 2017-09-15 DIAGNOSIS — M79672 Pain in left foot: Secondary | ICD-10-CM | POA: Diagnosis not present

## 2017-09-15 NOTE — Progress Notes (Signed)
Subjective:   Patient ID: [name], male   DOB: 77 y.o.   MRN: [id number]   HPI Patient presents with severe corn callus formation on both feet that are painful   ROS      Objective:  Physical Exam  Chronic lesion formation bilateral with pain     Assessment:  Thick callus formation that are painful with digital and metatarsal involvement     Plan:  Debrided lesions bilateral with no iatrogenic bleeding noted

## 2017-10-09 DIAGNOSIS — K7460 Unspecified cirrhosis of liver: Secondary | ICD-10-CM | POA: Diagnosis not present

## 2017-10-09 DIAGNOSIS — K769 Liver disease, unspecified: Secondary | ICD-10-CM | POA: Diagnosis not present

## 2017-10-09 DIAGNOSIS — R932 Abnormal findings on diagnostic imaging of liver and biliary tract: Secondary | ICD-10-CM | POA: Diagnosis not present

## 2017-10-18 DIAGNOSIS — I10 Essential (primary) hypertension: Secondary | ICD-10-CM | POA: Diagnosis not present

## 2017-10-18 DIAGNOSIS — Z952 Presence of prosthetic heart valve: Secondary | ICD-10-CM | POA: Diagnosis not present

## 2017-10-18 DIAGNOSIS — C220 Liver cell carcinoma: Secondary | ICD-10-CM | POA: Diagnosis not present

## 2017-10-18 DIAGNOSIS — Z87891 Personal history of nicotine dependence: Secondary | ICD-10-CM | POA: Diagnosis not present

## 2017-10-18 DIAGNOSIS — E119 Type 2 diabetes mellitus without complications: Secondary | ICD-10-CM | POA: Diagnosis not present

## 2017-10-18 DIAGNOSIS — B1920 Unspecified viral hepatitis C without hepatic coma: Secondary | ICD-10-CM | POA: Diagnosis not present

## 2017-10-18 DIAGNOSIS — K7460 Unspecified cirrhosis of liver: Secondary | ICD-10-CM | POA: Diagnosis not present

## 2017-10-18 DIAGNOSIS — E785 Hyperlipidemia, unspecified: Secondary | ICD-10-CM | POA: Diagnosis not present

## 2017-10-18 DIAGNOSIS — K7469 Other cirrhosis of liver: Secondary | ICD-10-CM | POA: Diagnosis not present

## 2017-10-18 DIAGNOSIS — I350 Nonrheumatic aortic (valve) stenosis: Secondary | ICD-10-CM | POA: Diagnosis not present

## 2017-10-19 DIAGNOSIS — C220 Liver cell carcinoma: Secondary | ICD-10-CM | POA: Insufficient documentation

## 2017-10-24 DIAGNOSIS — Z136 Encounter for screening for cardiovascular disorders: Secondary | ICD-10-CM | POA: Diagnosis not present

## 2017-10-24 DIAGNOSIS — Z0001 Encounter for general adult medical examination with abnormal findings: Secondary | ICD-10-CM | POA: Diagnosis not present

## 2017-10-24 DIAGNOSIS — C220 Liver cell carcinoma: Secondary | ICD-10-CM | POA: Diagnosis not present

## 2017-10-24 DIAGNOSIS — Z79899 Other long term (current) drug therapy: Secondary | ICD-10-CM | POA: Diagnosis not present

## 2017-10-24 DIAGNOSIS — E7800 Pure hypercholesterolemia, unspecified: Secondary | ICD-10-CM | POA: Diagnosis not present

## 2017-10-24 DIAGNOSIS — E119 Type 2 diabetes mellitus without complications: Secondary | ICD-10-CM | POA: Diagnosis not present

## 2017-10-24 DIAGNOSIS — Z952 Presence of prosthetic heart valve: Secondary | ICD-10-CM | POA: Diagnosis not present

## 2017-10-24 DIAGNOSIS — D649 Anemia, unspecified: Secondary | ICD-10-CM | POA: Diagnosis not present

## 2017-10-24 DIAGNOSIS — I10 Essential (primary) hypertension: Secondary | ICD-10-CM | POA: Diagnosis not present

## 2017-10-24 DIAGNOSIS — N529 Male erectile dysfunction, unspecified: Secondary | ICD-10-CM | POA: Diagnosis not present

## 2017-11-03 ENCOUNTER — Telehealth: Payer: Self-pay | Admitting: Cardiology

## 2017-11-03 DIAGNOSIS — Z7984 Long term (current) use of oral hypoglycemic drugs: Secondary | ICD-10-CM | POA: Diagnosis not present

## 2017-11-03 DIAGNOSIS — I10 Essential (primary) hypertension: Secondary | ICD-10-CM | POA: Diagnosis not present

## 2017-11-03 DIAGNOSIS — E785 Hyperlipidemia, unspecified: Secondary | ICD-10-CM | POA: Diagnosis not present

## 2017-11-03 DIAGNOSIS — Z952 Presence of prosthetic heart valve: Secondary | ICD-10-CM | POA: Diagnosis not present

## 2017-11-03 DIAGNOSIS — C220 Liver cell carcinoma: Secondary | ICD-10-CM | POA: Diagnosis not present

## 2017-11-03 DIAGNOSIS — Z7982 Long term (current) use of aspirin: Secondary | ICD-10-CM | POA: Diagnosis not present

## 2017-11-03 DIAGNOSIS — E118 Type 2 diabetes mellitus with unspecified complications: Secondary | ICD-10-CM | POA: Diagnosis not present

## 2017-11-03 DIAGNOSIS — B1920 Unspecified viral hepatitis C without hepatic coma: Secondary | ICD-10-CM | POA: Diagnosis not present

## 2017-11-03 DIAGNOSIS — K7460 Unspecified cirrhosis of liver: Secondary | ICD-10-CM | POA: Diagnosis not present

## 2017-11-03 DIAGNOSIS — Z87891 Personal history of nicotine dependence: Secondary | ICD-10-CM | POA: Diagnosis not present

## 2017-11-03 DIAGNOSIS — Z79899 Other long term (current) drug therapy: Secondary | ICD-10-CM | POA: Diagnosis not present

## 2017-11-03 DIAGNOSIS — E119 Type 2 diabetes mellitus without complications: Secondary | ICD-10-CM | POA: Diagnosis not present

## 2017-11-03 NOTE — Telephone Encounter (Signed)
Note not needed 

## 2017-12-07 DIAGNOSIS — K769 Liver disease, unspecified: Secondary | ICD-10-CM | POA: Diagnosis not present

## 2017-12-07 DIAGNOSIS — C220 Liver cell carcinoma: Secondary | ICD-10-CM | POA: Diagnosis not present

## 2017-12-22 ENCOUNTER — Encounter: Payer: Self-pay | Admitting: Podiatry

## 2017-12-22 ENCOUNTER — Ambulatory Visit (INDEPENDENT_AMBULATORY_CARE_PROVIDER_SITE_OTHER): Payer: Medicare Other | Admitting: Podiatry

## 2017-12-22 DIAGNOSIS — M79675 Pain in left toe(s): Secondary | ICD-10-CM | POA: Diagnosis not present

## 2017-12-22 DIAGNOSIS — E1149 Type 2 diabetes mellitus with other diabetic neurological complication: Secondary | ICD-10-CM | POA: Diagnosis not present

## 2017-12-22 DIAGNOSIS — B351 Tinea unguium: Secondary | ICD-10-CM | POA: Diagnosis not present

## 2017-12-22 DIAGNOSIS — Q828 Other specified congenital malformations of skin: Secondary | ICD-10-CM | POA: Diagnosis not present

## 2017-12-22 DIAGNOSIS — E1140 Type 2 diabetes mellitus with diabetic neuropathy, unspecified: Secondary | ICD-10-CM | POA: Diagnosis not present

## 2017-12-22 DIAGNOSIS — M79674 Pain in right toe(s): Secondary | ICD-10-CM

## 2017-12-23 NOTE — Progress Notes (Signed)
Subjective:   Patient ID: [name], male   DOB: 77 y.o.   MRN: [id number]   HPI Patient presents with severe lesions x4 of both feet along with nail disease 1-5 both feet and long-term diabetic with diminished neurological sensation   ROS      Objective:  Physical Exam  Neurovascular status unchanged from previous visit with patient found to have severe keratotic lesion sub-135 both feet and nail disease with incurvation elongation deformity of all nails     Assessment:  At risk diabetic with mycotic nail infection and chronic painful lesion formation x3 of both feet     Plan:  H&P conditions reviewed and aggressive debridement accomplished today of all lesions nails no iatrogenic bleeding and reappoint for routine care

## 2018-01-12 DIAGNOSIS — C220 Liver cell carcinoma: Secondary | ICD-10-CM | POA: Diagnosis not present

## 2018-01-12 DIAGNOSIS — K7460 Unspecified cirrhosis of liver: Secondary | ICD-10-CM | POA: Diagnosis not present

## 2018-01-17 DIAGNOSIS — R350 Frequency of micturition: Secondary | ICD-10-CM | POA: Diagnosis not present

## 2018-01-17 DIAGNOSIS — C220 Liver cell carcinoma: Secondary | ICD-10-CM | POA: Diagnosis not present

## 2018-02-06 DIAGNOSIS — Z23 Encounter for immunization: Secondary | ICD-10-CM | POA: Diagnosis not present

## 2018-03-21 ENCOUNTER — Encounter: Payer: Self-pay | Admitting: Cardiology

## 2018-03-27 ENCOUNTER — Ambulatory Visit (INDEPENDENT_AMBULATORY_CARE_PROVIDER_SITE_OTHER): Payer: Medicare Other | Admitting: Podiatry

## 2018-03-27 ENCOUNTER — Encounter: Payer: Self-pay | Admitting: Podiatry

## 2018-03-27 DIAGNOSIS — L84 Corns and callosities: Secondary | ICD-10-CM | POA: Diagnosis not present

## 2018-03-27 DIAGNOSIS — B351 Tinea unguium: Secondary | ICD-10-CM | POA: Diagnosis not present

## 2018-03-27 DIAGNOSIS — M79675 Pain in left toe(s): Secondary | ICD-10-CM | POA: Diagnosis not present

## 2018-03-27 DIAGNOSIS — M79674 Pain in right toe(s): Secondary | ICD-10-CM

## 2018-03-27 DIAGNOSIS — E1142 Type 2 diabetes mellitus with diabetic polyneuropathy: Secondary | ICD-10-CM

## 2018-03-27 NOTE — Patient Instructions (Signed)

## 2018-04-15 NOTE — Progress Notes (Signed)
Subjective: [name] presents today with h/o diabetic neuropathy and cc of painful, discolored, thick toenails which presents today  Pain is aggravated when wearing enclosed shoe gear. Pain is getting progressively worse and relieved with periodic professional debridement.   Patient also presents with cc of calluses and corns b/l feet which require periodic debridement due to pain and risk factors.   Current Outpatient Medications:  .  acetaminophen (TYLENOL) 500 MG tablet, Take 500 mg by mouth every 6 (six) hours as needed for mild pain or moderate pain., Disp: , Rfl:  .  amLODipine (NORVASC) 10 MG tablet, Take 10 mg by mouth daily., Disp: , Rfl: 0 .  aspirin EC 81 MG EC tablet, Take 1 tablet (81 mg total) by mouth daily., Disp: , Rfl:  .  atorvastatin (LIPITOR) 20 MG tablet, Take 20 mg by mouth at bedtime., Disp: , Rfl:  .  CIALIS 20 MG tablet, Take 1 tablet by mouth as needed. For erectile dysfunction, Disp: , Rfl: 0 .  ferrous gluconate (FERGON) 324 MG tablet, Take 1 tablet (324 mg total) by mouth daily with breakfast., Disp: 30 tablet, Rfl: 3 .  FLUZONE HIGH-DOSE 0.5 ML injection, Inject as directed once., Disp: , Rfl: 0 .  lisinopril-hydrochlorothiazide (PRINZIDE,ZESTORETIC) 20-25 MG per tablet, Take 1 tablet by mouth daily., Disp: , Rfl:  .  metFORMIN (GLUCOPHAGE) 1000 MG tablet, TK 1 T PO BID WC, Disp: , Rfl: 0 .  metFORMIN (GLUCOPHAGE) 500 MG tablet, Take 500 mg by mouth 2 (two) times daily with a meal. Take 500mg  by mouth twice daily, Disp: , Rfl:  .  metoprolol succinate (TOPROL-XL) 25 MG 24 hr tablet, Take by mouth., Disp: , Rfl:  .  metoprolol tartrate (LOPRESSOR) 25 MG tablet, take 1/2 tablet by mouth twice a day, Disp: 30 tablet, Rfl: 5 .  SF 5000 PLUS 1.1 % CREA dental cream, BRUSH TEETH HS WITH TOOTHPASTE THEN EXPECTORATE, Disp: , Rfl: 2  Allergies  Allergen Reactions  . Crestor [Rosuvastatin]     Discomfort, RESTLESS, CAN'T SLEEP Discomfort, RESTLESS, CAN'T  SLEEP Discomfort, RESTLESS, CAN'T SLEEP   . Vytorin [Ezetimibe-Simvastatin]     Leg pains Restlessness at night      Objective:  Vascular Examination: Capillary refill time <3 seconds x 10 digits Dorsalis pedis pulses palpable bl Posterior tibial pulses diminished b/l No digital hair x 10 digits Skin temperature warm to cool b/l  Dermatological Examination: Skin with normal turgor, texture and tone b/l  Toenails 1-5 b/l discolored, thick, dystrophic with subungual debris and pain with palpation to nailbeds due to thickness of nails.  Hyperkeratotic lesion noted submetatarsal heads 2nd left, 1st b/l, 3, 4 right foot. Interdigital hyperkeratosis noted medial aspect left 2nd digit and distal tip of right 4th digit.  Musculoskeletal: Muscle strength 5/5 to all LE muscle groups  Neurological: Sensation diminished with 10 gram monofilament. Vibratory sensation diminished  Assessment: 1. Painful onychomycosis toenails 1-5 b/l Calluses and corns submetatarsal heads 2nd left, 1st b/l, 3, 4 right foot. Interdigital hyperkeratosis noted medial aspect left 2nd digit and distal tip of right 4th digit. 2. NIDDM with Diabetic neuropathy  Plan: 1. Continue diabetic foot care principles. Literature dispensed. 2. Toenails 1-5 b/l were debrided in length and girth without iatrogenic bleeding. Hyperkeratotic lesion pared with sterile chisel bladesubmetatarsal heads 2nd left, 1st b/l, 3, 4 right foot. Interdigital hyperkeratosis noted medial aspect left 2nd digit and distal tip of right 4th digit. 3. Patient to continue soft, supportive shoe gear 4.  Patient to report any pedal injuries to medical professional  5. Follow up 3 months. Patient/POA to call should there be a concern in the interim.

## 2018-04-20 DIAGNOSIS — K7460 Unspecified cirrhosis of liver: Secondary | ICD-10-CM | POA: Diagnosis not present

## 2018-04-20 DIAGNOSIS — C220 Liver cell carcinoma: Secondary | ICD-10-CM | POA: Diagnosis not present

## 2018-04-25 DIAGNOSIS — Z79899 Other long term (current) drug therapy: Secondary | ICD-10-CM | POA: Diagnosis not present

## 2018-04-25 DIAGNOSIS — Z952 Presence of prosthetic heart valve: Secondary | ICD-10-CM | POA: Diagnosis not present

## 2018-04-25 DIAGNOSIS — E119 Type 2 diabetes mellitus without complications: Secondary | ICD-10-CM | POA: Diagnosis not present

## 2018-04-25 DIAGNOSIS — C220 Liver cell carcinoma: Secondary | ICD-10-CM | POA: Diagnosis not present

## 2018-04-25 DIAGNOSIS — I10 Essential (primary) hypertension: Secondary | ICD-10-CM | POA: Diagnosis not present

## 2018-04-25 DIAGNOSIS — Z7984 Long term (current) use of oral hypoglycemic drugs: Secondary | ICD-10-CM | POA: Diagnosis not present

## 2018-04-25 DIAGNOSIS — D649 Anemia, unspecified: Secondary | ICD-10-CM | POA: Diagnosis not present

## 2018-04-25 DIAGNOSIS — E7800 Pure hypercholesterolemia, unspecified: Secondary | ICD-10-CM | POA: Diagnosis not present

## 2018-05-07 ENCOUNTER — Encounter: Payer: Self-pay | Admitting: Cardiology

## 2018-05-07 ENCOUNTER — Ambulatory Visit (INDEPENDENT_AMBULATORY_CARE_PROVIDER_SITE_OTHER): Payer: Medicare Other | Admitting: Cardiology

## 2018-05-07 VITALS — BP 130/70 | HR 53 | Ht 71.0 in | Wt 143.8 lb

## 2018-05-07 DIAGNOSIS — I10 Essential (primary) hypertension: Secondary | ICD-10-CM

## 2018-05-07 DIAGNOSIS — E782 Mixed hyperlipidemia: Secondary | ICD-10-CM | POA: Diagnosis not present

## 2018-05-07 DIAGNOSIS — E119 Type 2 diabetes mellitus without complications: Secondary | ICD-10-CM

## 2018-05-07 DIAGNOSIS — Z952 Presence of prosthetic heart valve: Secondary | ICD-10-CM

## 2018-05-07 NOTE — Progress Notes (Signed)
Cardiology Office Note:    Date:  05/07/2018   ID:  VANDER KUEKER, DOB [birth year], MRN [id number]  PCP:  Lujean Amel, MD  Cardiologist:  Candee Furbish, MD  Electrophysiologist:  None   Referring MD: Lujean Amel, MD     History of Present Illness:    [name] is a 77 y.o. male here for follow-up of aortic valve.  Has bioprosthetic valve placed on 09/08/7739 with uncomplicated course.  Cardiac rehab.  Other comorbidities include hepatocellular carcinoma.  Had prior hepatitis C.  Mild bilateral carotid artery disease noted in July 2015.  Overall he has been doing quite well.  No cardiac complaints.  No fevers chills nausea vomiting syncope.  It is hard for him to maintain his weight but he is eating.  He knows to take dental antibiotics with his aortic valve.  He does state that after taking his medications in the morning sometimes he feels a little bit woozy 45 minutes later.   Past Medical History:  Diagnosis Date  . Acute epididymo-orchitis   . Aortic stenosis    s/p bioprosthetic AVR (Dr. Cyndia Bent) 11/2013  . Arthritis   . Diabetes mellitus without complication ([place])   . ED (erectile dysfunction)   . Essential hypertension, benign   . Heart murmur   . Hep C w/o coma, chronic ([place])   . History of blood transfusion   . History of DVT of lower extremity   . Hx of echocardiogram    Echo (8/15):  Severe LVH, EF 60-65%, no RWMA, Gr 3 DD, AVR ok (mean 21 mmHg), MAC, mild LAE, mild to mod RAE  . Hypercholesterolemia     Past Surgical History:  Procedure Laterality Date  . AORTIC VALVE REPLACEMENT N/A 11/19/2013   Procedure: AORTIC VALVE REPLACEMENT (AVR);  Surgeon: Gaye Pollack, MD;  Location: [facility];  Service: Open Heart Surgery;  Laterality: N/A;  . BUNIONECTOMY Bilateral 2008  . CARDIAC CATHETERIZATION  10/01/13  . COLONOSCOPY  01/2010   internal hemorrhoids.  Dr Deatra Ina  . ESOPHAGOGASTRODUODENOSCOPY N/A 04/24/2013   Procedure: ESOPHAGOGASTRODUODENOSCOPY (EGD);   Surgeon: Jerene Bears, MD;  Location: [facility] ENDOSCOPY;  Service: Endoscopy;  Laterality: N/A;  . INGUINAL HERNIA REPAIR Right   . INTRAOPERATIVE TRANSESOPHAGEAL ECHOCARDIOGRAM N/A 11/19/2013   Procedure: INTRAOPERATIVE TRANSESOPHAGEAL ECHOCARDIOGRAM;  Surgeon: Gaye Pollack, MD;  Location: [facility] OR;  Service: Open Heart Surgery;  Laterality: N/A;  . KNEE ARTHROSCOPY Left 05/2009   Dr Collier Salina  . LAMINECTOMY  06/2005   Dr Joya Salm. multiple lumbar level laminectomies.   Marland Kitchen LEFT AND RIGHT HEART CATHETERIZATION WITH CORONARY ANGIOGRAM N/A 10/01/2013   Procedure: LEFT AND RIGHT HEART CATHETERIZATION WITH CORONARY ANGIOGRAM;  Surgeon: Candee Furbish, MD;  Location: [facility] CATH LAB;  Service: Cardiovascular;  Laterality: N/A;  . TEE WITHOUT CARDIOVERSION  11/2003   Aoritic valve sclerosis.   . TONSILLECTOMY      Current Medications: Current Meds  Medication Sig  . acetaminophen (TYLENOL) 500 MG tablet Take 500 mg by mouth every 6 (six) hours as needed for mild pain or moderate pain.  Marland Kitchen amLODipine (NORVASC) 10 MG tablet Take 10 mg by mouth daily.  Marland Kitchen aspirin EC 81 MG EC tablet Take 1 tablet (81 mg total) by mouth daily.  Marland Kitchen atorvastatin (LIPITOR) 20 MG tablet Take 20 mg by mouth at bedtime.  . ferrous gluconate (FERGON) 324 MG tablet Take 1 tablet (324 mg total) by mouth daily with breakfast.  . FLUZONE HIGH-DOSE 0.5 ML injection Inject  as directed once.  Marland Kitchen lisinopril-hydrochlorothiazide (PRINZIDE,ZESTORETIC) 20-25 MG per tablet Take 1 tablet by mouth daily.  . metFORMIN (GLUCOPHAGE) 500 MG tablet Take 500 mg by mouth 2 (two) times daily with a meal. Take 555m by mouth twice daily  . SF 5000 PLUS 1.1 % CREA dental cream BRUSH TEETH HS WITH TOOTHPASTE THEN EXPECTORATE  . [DISCONTINUED] metoprolol tartrate (LOPRESSOR) 25 MG tablet Take 12.5 mg by mouth daily.     Allergies:   Crestor [rosuvastatin] and Vytorin [ezetimibe-simvastatin]   Social History   Socioeconomic History  . Marital status: Married     Spouse name: Not on file  . Number of children: Not on file  . Years of education: Not on file  . Highest education level: Not on file  Occupational History  . Not on file  Social Needs  . Financial resource strain: Not on file  . Food insecurity:    Worry: Not on file    Inability: Not on file  . Transportation needs:    Medical: Not on file    Non-medical: Not on file  Tobacco Use  . Smoking status: Former Smoker    Types: Cigarettes    Last attempt to quit: 09/26/1986    Years since quitting: 31.6  . Smokeless tobacco: Never Used  Substance and Sexual Activity  . Alcohol use: No  . Drug use: No  . Sexual activity: Not on file  Lifestyle  . Physical activity:    Days per week: Not on file    Minutes per session: Not on file  . Stress: Not on file  Relationships  . Social connections:    Talks on phone: Not on file    Gets together: Not on file    Attends religious service: Not on file    Active member of club or organization: Not on file    Attends meetings of clubs or organizations: Not on file    Relationship status: Not on file  Other Topics Concern  . Not on file  Social History Narrative  . Not on file     Family History: The patient's family history includes Diabetes in his father and mother; Hypertension in his unknown relative; Stroke in his father and mother. There is no history of Heart attack.  ROS:   Please see the history of present illness.    Denies any syncope bleeding orthopnea PND all other systems reviewed and are negative.  EKGs/Labs/Other Studies Reviewed:    The following studies were reviewed today:  Echocardiogram 11/17/2016: - Left ventricle: The cavity size was normal. Wall thickness was   increased in a pattern of moderate LVH. There was mild focal   basal hypertrophy of the septum. Systolic function was normal.   The estimated ejection fraction was in the range of 60% to 65%.   Wall motion was normal; there were no regional wall  motion   abnormalities. Features are consistent with a pseudonormal left   ventricular filling pattern, with concomitant abnormal relaxation   and increased filling pressure (grade 2 diastolic dysfunction). - Aortic valve: A bioprosthesis was present. - Mitral valve: Calcified annulus. - Left atrium: The atrium was severely dilated. - Right ventricle: The cavity size was mildly dilated. - Right atrium: The atrium was mildly dilated. - Pulmonary arteries: Systolic pressure was mildly increased.  Impressions:  - Normal LV systolic function; moderate LVH; moderate diastolic   dysfunction; s/p AVR with normal mean gradient and no AI; severe   LAE; mild RAE  and RVE; mild TR with mildly elevated pulmonary   pressure.   EKG:  EKG is  ordered today.  The ekg ordered today demonstrates sinus bradycardia heart rate 53 bpm incomplete left bundle branch block with repolarization abnormality noted in the lateral leads.  Recent Labs: No results found for requested labs within last 8760 hours.  Recent Lipid Panel No results found for: CHOL, TRIG, HDL, CHOLHDL, VLDL, LDLCALC, LDLDIRECT  Physical Exam:    VS:  BP 130/70   Pulse (!) 53   Ht _0  (1.803 m)   Wt 143 lb 12.8 oz (65.2 kg)   BMI 20.06 kg/m     Wt Readings from Last 3 Encounters:  05/07/18 143 lb 12.8 oz (65.2 kg)  04/28/17 142 lb 12.8 oz (64.8 kg)  11/03/16 144 lb 6.4 oz (65.5 kg)     GEN: Thin in no acute distress HEENT: Normal NECK: No JVD; No carotid bruits LYMPHATICS: No lymphadenopathy CARDIAC: brady reg, 2/6 SM, no rubs, gallops RESPIRATORY:  Clear to auscultation without rales, wheezing or rhonchi  ABDOMEN: Soft, non-tender, non-distended MUSCULOSKELETAL:  No edema; No deformity  SKIN: Warm and dry NEUROLOGIC:  Alert and oriented x 3 PSYCHIATRIC:  Normal affect   ASSESSMENT:    1. Essential hypertension    PLAN:    In order of problems listed above:  Bioprosthetic aortic valve 2015 status post severe  aortic stenosis - Functioning well.  No changes made. ECHO 11/2016 reviewed as above.  Bradycardia - Heart rate quite slow.  He tells me that at [facility] his heart rate was significantly low at one point.  We will go ahead and stop his metoprolol 12.5 mg once a day.  This had been decreased by Dr. Dorthy Cooler previously.  Essential hypertension -No changes made stable.  Note, stopping low-dose metoprolol 12.5 because of bradycardia.  Hepatocellular carcinoma - Notes reviewed.  Prior ablative therapy 12/07/2017. [facility]  Diabetes with hypertension -Prior hemoglobin A1c in the 6. 4 range.   Medication Adjustments/Labs and Tests Ordered: Current medicines are reviewed at length with the patient today.  Concerns regarding medicines are outlined above.  Orders Placed This Encounter  Procedures  . EKG 12-Lead   No orders of the defined types were placed in this encounter.   Patient Instructions  Medication Instructions:  Please discontinue Metoprolol. Continue all other medications as listed.  If you need a refill on your cardiac medications before your next appointment, please call your pharmacy.   Follow-Up: At [facility], you and your health needs are our priority.  As part of our continuing mission to provide you with exceptional heart care, we have created designated Provider Care Teams.  These Care Teams include your primary Cardiologist (physician) and Advanced Practice Providers (APPs -  Physician Assistants and Nurse Practitioners) who all work together to provide you with the care you need, when you need it. You will need a follow up appointment in 12 months.  Please call our office 2 months in advance to schedule this appointment.  You may see Candee Furbish, MD or one of the following Advanced Practice Providers on your designated Care Team:   Truitt Merle, NP Cecilie Kicks, NP . Kathyrn Drown, NP  Thank you for choosing [facility]!!         Signed, Candee Furbish, MD    05/07/2018 9:50 AM    [place]

## 2018-05-07 NOTE — Patient Instructions (Signed)
Medication Instructions:  Please discontinue Metoprolol. Continue all other medications as listed.  If you need a refill on your cardiac medications before your next appointment, please call your pharmacy.   Follow-Up: At [facility], you and your health needs are our priority.  As part of our continuing mission to provide you with exceptional heart care, we have created designated Provider Care Teams.  These Care Teams include your primary Cardiologist (physician) and Advanced Practice Providers (APPs -  Physician Assistants and Nurse Practitioners) who all work together to provide you with the care you need, when you need it. You will need a follow up appointment in 12 months.  Please call our office 2 months in advance to schedule this appointment.  You may see Candee Furbish, MD or one of the following Advanced Practice Providers on your designated Care Team:   Truitt Merle, NP Cecilie Kicks, NP . Kathyrn Drown, NP  Thank you for choosing [facility]!!

## 2018-06-26 ENCOUNTER — Ambulatory Visit (INDEPENDENT_AMBULATORY_CARE_PROVIDER_SITE_OTHER): Payer: Medicare Other | Admitting: Podiatry

## 2018-06-26 ENCOUNTER — Other Ambulatory Visit: Payer: Self-pay

## 2018-06-26 DIAGNOSIS — L84 Corns and callosities: Secondary | ICD-10-CM

## 2018-06-26 DIAGNOSIS — B351 Tinea unguium: Secondary | ICD-10-CM | POA: Diagnosis not present

## 2018-06-26 DIAGNOSIS — E1140 Type 2 diabetes mellitus with diabetic neuropathy, unspecified: Secondary | ICD-10-CM

## 2018-06-26 DIAGNOSIS — M79675 Pain in left toe(s): Secondary | ICD-10-CM | POA: Diagnosis not present

## 2018-06-26 DIAGNOSIS — M79674 Pain in right toe(s): Secondary | ICD-10-CM | POA: Diagnosis not present

## 2018-06-26 DIAGNOSIS — E1149 Type 2 diabetes mellitus with other diabetic neurological complication: Secondary | ICD-10-CM

## 2018-06-26 NOTE — Patient Instructions (Signed)
Onychomycosis/Fungal Toenails  WHAT IS IT? An infection that lies within the keratin of your nail plate that is caused by a fungus.  WHY ME? Fungal infections affect all ages, sexes, races, and creeds.  There may be many factors that predispose you to a fungal infection such as age, coexisting medical conditions such as diabetes, or an autoimmune disease; stress, medications, fatigue, genetics, etc.  Bottom line: fungus thrives in a warm, moist environment and your shoes offer such a location.  IS IT CONTAGIOUS? Theoretically, yes.  You do not want to share shoes, nail clippers or files with someone who has fungal toenails.  Walking around barefoot in the same room or sleeping in the same bed is unlikely to transfer the organism.  It is important to realize, however, that fungus can spread easily from one nail to the next on the same foot.  HOW DO WE TREAT THIS?  There are several ways to treat this condition.  Treatment may depend on many factors such as age, medications, pregnancy, liver and kidney conditions, etc.  It is best to ask your doctor which options are available to you.  1. No treatment.   Unlike many other medical concerns, you can live with this condition.  However for many people this can be a painful condition and may lead to ingrown toenails or a bacterial infection.  It is recommended that you keep the nails cut short to help reduce the amount of fungal nail. 2. Topical treatment.  These range from herbal remedies to prescription strength nail lacquers.  About 40-50% effective, topicals require twice daily application for approximately 9 to 12 months or until an entirely new nail has grown out.  The most effective topicals are medical grade medications available through physicians offices. 3. Oral antifungal medications.  With an 80-90% cure rate, the most common oral medication requires 3 to 4 months of therapy and stays in your system for a year as the new nail grows out.  Oral  antifungal medications do require blood work to make sure it is a safe drug for you.  A liver function panel will be performed prior to starting the medication and after the first month of treatment.  It is important to have the blood work performed to avoid any harmful side effects.  In general, this medication safe but blood work is required. 4. Laser Therapy.  This treatment is performed by applying a specialized laser to the affected nail plate.  This therapy is noninvasive, fast, and non-painful.  It is not covered by insurance and is therefore, out of pocket.  The results have been very good with a 80-95% cure rate.  [facility] is the only practice in the area to offer this therapy. Permanent Nail Avulsion.  Removing the entire nail so that a new nail will not grow back.Corns and Calluses Corns are small areas of thickened skin that occur on the top, sides, or tip of a toe. They contain a cone-shaped core with a point that can press on a nerve below. This causes pain.  Calluses are areas of thickened skin that can occur anywhere on the body, including the hands, fingers, palms, soles of the feet, and heels. Calluses are usually larger than corns. What are the causes? Corns and calluses are caused by rubbing (friction) or pressure, such as from shoes that are too tight or do not fit properly. What increases the risk? Corns are more likely to develop in people who have misshapen   toes (toe deformities), such as hammer toes. Calluses can occur with friction to any area of the skin. They are more likely to develop in people who:  Work with their hands.  Wear shoes that fit poorly, are too tight, or are high-heeled.  Have toe deformities. What are the signs or symptoms? Symptoms of a corn or callus include:  A hard growth on the skin.  Pain or tenderness under the skin.  Redness and swelling.  Increased discomfort while wearing tight-fitting shoes, if your feet are affected. If a  corn or callus becomes infected, symptoms may include:  Redness and swelling that gets worse.  Pain.  Fluid, blood, or pus draining from the corn or callus. How is this diagnosed? Corns and calluses may be diagnosed based on your symptoms, your medical history, and a physical exam. How is this treated? Treatment for corns and calluses may include:  Removing the cause of the friction or pressure. This may involve: ? Changing your shoes. ? Wearing shoe inserts (orthotics) or other protective layers in your shoes, such as a corn pad. ? Wearing gloves.  Applying medicine to the skin (topical medicine) to help soften skin in the hardened, thickened areas.  Removing layers of dead skin with a file to reduce the size of the corn or callus.  Removing the corn or callus with a scalpel or laser.  Taking antibiotic medicines, if your corn or callus is infected.  Having surgery, if a toe deformity is the cause. Follow these instructions at home:   Take over-the-counter and prescription medicines only as told by your health care provider.  If you were prescribed an antibiotic, take it as told by your health care provider. Do not stop taking it even if your condition starts to improve.  Wear shoes that fit well. Avoid wearing high-heeled shoes and shoes that are too tight or too loose.  Wear any padding, protective layers, gloves, or orthotics as told by your health care provider.  Soak your hands or feet and then use a file or pumice stone to soften your corn or callus. Do this as told by your health care provider.  Check your corn or callus every day for symptoms of infection. Contact a health care provider if you:  Notice that your symptoms do not improve with treatment.  Have redness or swelling that gets worse.  Notice that your corn or callus becomes painful.  Have fluid, blood, or pus coming from your corn or callus.  Have new symptoms. Summary  Corns are small areas of  thickened skin that occur on the top, sides, or tip of a toe.  Calluses are areas of thickened skin that can occur anywhere on the body, including the hands, fingers, palms, and soles of the feet. Calluses are usually larger than corns.  Corns and calluses are caused by rubbing (friction) or pressure, such as from shoes that are too tight or do not fit properly.  Treatment may include wearing any padding, protective layers, gloves, or orthotics as told by your health care provider. This information is not intended to replace advice given to you by your health care provider. Make sure you discuss any questions you have with your health care provider. Document Released: 01/30/2004 Document Revised: 03/08/2017 Document Reviewed: 03/08/2017 Elsevier Interactive Patient Education  2019 [employer] Diabetes Mellitus and [place] care is an important part of your health, especially when you have diabetes. Diabetes may cause you to have problems because of poor  blood flow (circulation) to your feet and legs, which can cause your skin to:  Become thinner and drier.  Break more easily.  Heal more slowly.  Peel and crack. You may also have nerve damage (neuropathy) in your legs and feet, causing decreased feeling in them. This means that you may not notice minor injuries to your feet that could lead to more serious problems. Noticing and addressing any potential problems early is the best way to prevent future foot problems. How to care for your feet Foot hygiene  Wash your feet daily with warm water and mild soap. Do not use hot water. Then, pat your feet and the areas between your toes until they are completely dry. Do not soak your feet as this can dry your skin.  Trim your toenails straight across. Do not dig under them or around the cuticle. File the edges of your nails with an emery board or nail file.  Apply a moisturizing lotion or petroleum jelly to the skin on your feet and to  dry, brittle toenails. Use lotion that does not contain alcohol and is unscented. Do not apply lotion between your toes. Shoes and socks  Wear clean socks or stockings every day. Make sure they are not too tight. Do not wear knee-high stockings since they may decrease blood flow to your legs.  Wear shoes that fit properly and have enough cushioning. Always look in your shoes before you put them on to be sure there are no objects inside.  To break in new shoes, wear them for just a few hours a day. This prevents injuries on your feet. Wounds, scrapes, corns, and calluses  Check your feet daily for blisters, cuts, bruises, sores, and redness. If you cannot see the bottom of your feet, use a mirror or ask someone for help.  Do not cut corns or calluses or try to remove them with medicine.  If you find a minor scrape, cut, or break in the skin on your feet, keep it and the skin around it clean and dry. You may clean these areas with mild soap and water. Do not clean the area with peroxide, alcohol, or iodine.  If you have a wound, scrape, corn, or callus on your foot, look at it several times a day to make sure it is healing and not infected. Check for: ? Redness, swelling, or pain. ? Fluid or blood. ? Warmth. ? Pus or a bad smell. General instructions  Do not cross your legs. This may decrease blood flow to your feet.  Do not use heating pads or hot water bottles on your feet. They may burn your skin. If you have lost feeling in your feet or legs, you may not know this is happening until it is too late.  Protect your feet from hot and cold by wearing shoes, such as at the beach or on hot pavement.  Schedule a complete foot exam at least once a year (annually) or more often if you have foot problems. If you have foot problems, report any cuts, sores, or bruises to your health care provider immediately. Contact a health care provider if:  You have a medical condition that increases your  risk of infection and you have any cuts, sores, or bruises on your feet.  You have an injury that is not healing.  You have redness on your legs or feet.  You feel burning or tingling in your legs or feet.  You have pain or cramps in  your legs and feet.  Your legs or feet are numb.  Your feet always feel cold.  You have pain around a toenail. Get help right away if:  You have a wound, scrape, corn, or callus on your foot and: ? You have pain, swelling, or redness that gets worse. ? You have fluid or blood coming from the wound, scrape, corn, or callus. ? Your wound, scrape, corn, or callus feels warm to the touch. ? You have pus or a bad smell coming from the wound, scrape, corn, or callus. ? You have a fever. ? You have a red line going up your leg. Summary  Check your feet every day for cuts, sores, red spots, swelling, and blisters.  Moisturize feet and legs daily.  Wear shoes that fit properly and have enough cushioning.  If you have foot problems, report any cuts, sores, or bruises to your health care provider immediately.  Schedule a complete foot exam at least once a year (annually) or more often if you have foot problems. This information is not intended to replace advice given to you by your health care provider. Make sure you discuss any questions you have with your health care provider. Document Released: 04/22/2000 Document Revised: 06/07/2017 Document Reviewed: 05/27/2016 Elsevier Interactive Patient Education  2019 [employer].

## 2018-07-04 ENCOUNTER — Encounter: Payer: Self-pay | Admitting: Podiatry

## 2018-07-04 NOTE — Progress Notes (Signed)
Subjective: [name] presents with history of diabetic neuropathy.  He is seen today for routine care of painful, mycotic toenails and painful corns and calluses bilaterally.  Both interfere with activities of daily living. Pain is aggravated when wearing enclosed shoe gear and is relieved with periodic professional debridement.  [name], [name], MD is his PCP.   Current Outpatient Medications:  .  acetaminophen (TYLENOL) 500 MG tablet, Take 500 mg by mouth every 6 (six) hours as needed for mild pain or moderate pain., Disp: , Rfl:  .  amLODipine (NORVASC) 10 MG tablet, Take 10 mg by mouth daily., Disp: , Rfl: 0 .  aspirin EC 81 MG EC tablet, Take 1 tablet (81 mg total) by mouth daily., Disp: , Rfl:  .  atorvastatin (LIPITOR) 20 MG tablet, Take 20 mg by mouth at bedtime., Disp: , Rfl:  .  ferrous gluconate (FERGON) 324 MG tablet, Take 1 tablet (324 mg total) by mouth daily with breakfast., Disp: 30 tablet, Rfl: 3 .  FLUZONE HIGH-DOSE 0.5 ML injection, Inject as directed once., Disp: , Rfl: 0 .  lisinopril-hydrochlorothiazide (PRINZIDE,ZESTORETIC) 20-25 MG per tablet, Take 1 tablet by mouth daily., Disp: , Rfl:  .  metFORMIN (GLUCOPHAGE) 1000 MG tablet, TK 1 T PO BID WC, Disp: , Rfl:  .  metFORMIN (GLUCOPHAGE) 500 MG tablet, Take 500 mg by mouth 2 (two) times daily with a meal. Take 500mg  by mouth twice daily, Disp: , Rfl:  .  SF 5000 PLUS 1.1 % CREA dental cream, BRUSH TEETH HS WITH TOOTHPASTE THEN EXPECTORATE, Disp: , Rfl: 2  Allergies  Allergen Reactions  . Crestor [Rosuvastatin]     Discomfort, RESTLESS, CAN'T SLEEP Discomfort, RESTLESS, CAN'T SLEEP Discomfort, RESTLESS, CAN'T SLEEP   . Vytorin [Ezetimibe-Simvastatin]     Leg pains Restlessness at night      Vascular Examination: Capillary refill time <3 seconds x 10 digits  Dorsalis pedis palpable bilaterally.   Posterior tibial pulses diminished b/l.  No digital hair x 10 digits.  Skin temperature warm to cool  b/l.  Dermatological Examination: Skin with normal turgor, texture and tone b/l.  Toenails 1-5 b/l discolored, thick, dystrophic with subungual debris and pain with palpation to nailbeds due to thickness of nails.  Hyperkeratotic lesions noted submetatarsal head 2 left foot, submetatarsal head: Bilaterally, submetatarsal heads 3, 4 right foot.  Interdigital hyperkeratotic lesion noted medial aspect left second digit and distal tip right fourth digit.  There is no erythema, no edema, no drainage, no flocculence noted with either of these lesions.   Musculoskeletal: Muscle strength 5/5 to all LE muscle groups  Neurological: Sensation diminished with 10 gram monofilament.  Vibratory sensation diminished.  Assessment: 1. Painful onychomycosis toenails 1-5 b/l 2. Callus/Corn submetatarsal head 2 left foot, submetatarsal head: Bilaterally, submetatarsal heads 3, 4 right foot, medial aspect left second digit and distal tip right fourth digit 3. NIDDM with Diabetic neuropathy  Plan: 1. Continue diabetic foot care principles.  Literature dispensed on today. 2. Toenails 1-5 b/l were debrided in length and girth without iatrogenic bleeding. 3. Hyperkeratotic lesion pared with sterile scalpel blade submetatarsal head 2 left foot, submetatarsal head: Bilaterally, submetatarsal heads 3, 4 right foot, medial aspect left second digit and distal tip right fourth digit without incident. 4. Patient to continue soft, supportive shoe gear. 5. Patient to report any pedal injuries to medical professional. 6. Follow up 10 weeks..  7. Patient/POA to call should there be a concern in the interim.

## 2018-07-18 DIAGNOSIS — K7460 Unspecified cirrhosis of liver: Secondary | ICD-10-CM | POA: Diagnosis not present

## 2018-07-18 DIAGNOSIS — Z8505 Personal history of malignant neoplasm of liver: Secondary | ICD-10-CM | POA: Diagnosis not present

## 2018-09-06 ENCOUNTER — Ambulatory Visit: Payer: Medicare Other | Admitting: Podiatry

## 2018-09-06 ENCOUNTER — Other Ambulatory Visit: Payer: Self-pay

## 2018-09-06 ENCOUNTER — Encounter: Payer: Self-pay | Admitting: Podiatry

## 2018-09-06 ENCOUNTER — Ambulatory Visit (INDEPENDENT_AMBULATORY_CARE_PROVIDER_SITE_OTHER): Payer: Medicare Other | Admitting: Podiatry

## 2018-09-06 VITALS — Temp 97.5°F

## 2018-09-06 DIAGNOSIS — M79674 Pain in right toe(s): Secondary | ICD-10-CM

## 2018-09-06 DIAGNOSIS — B351 Tinea unguium: Secondary | ICD-10-CM | POA: Diagnosis not present

## 2018-09-06 DIAGNOSIS — E1149 Type 2 diabetes mellitus with other diabetic neurological complication: Secondary | ICD-10-CM

## 2018-09-06 DIAGNOSIS — L84 Corns and callosities: Secondary | ICD-10-CM | POA: Diagnosis not present

## 2018-09-06 DIAGNOSIS — M79675 Pain in left toe(s): Secondary | ICD-10-CM

## 2018-09-06 DIAGNOSIS — E1140 Type 2 diabetes mellitus with diabetic neuropathy, unspecified: Secondary | ICD-10-CM | POA: Diagnosis not present

## 2018-09-06 NOTE — Patient Instructions (Addendum)
Diabetes Mellitus and Foot Care Foot care is an important part of your health, especially when you have diabetes. Diabetes may cause you to have problems because of poor blood flow (circulation) to your feet and legs, which can cause your skin to:  Become thinner and drier.  Break more easily.  Heal more slowly.  Peel and crack. You may also have nerve damage (neuropathy) in your legs and feet, causing decreased feeling in them. This means that you may not notice minor injuries to your feet that could lead to more serious problems. Noticing and addressing any potential problems early is the best way to prevent future foot problems. How to care for your feet Foot hygiene  Wash your feet daily with warm water and mild soap. Do not use hot water. Then, pat your feet and the areas between your toes until they are completely dry. Do not soak your feet as this can dry your skin.  Trim your toenails straight across. Do not dig under them or around the cuticle. File the edges of your nails with an emery board or nail file.  Apply a moisturizing lotion or petroleum jelly to the skin on your feet and to dry, brittle toenails. Use lotion that does not contain alcohol and is unscented. Do not apply lotion between your toes. Shoes and socks  Wear clean socks or stockings every day. Make sure they are not too tight. Do not wear knee-high stockings since they may decrease blood flow to your legs.  Wear shoes that fit properly and have enough cushioning. Always look in your shoes before you put them on to be sure there are no objects inside.  To break in new shoes, wear them for just a few hours a day. This prevents injuries on your feet. Wounds, scrapes, corns, and calluses  Check your feet daily for blisters, cuts, bruises, sores, and redness. If you cannot see the bottom of your feet, use a mirror or ask someone for help.  Do not cut corns or calluses or try to remove them with medicine.  If you  find a minor scrape, cut, or break in the skin on your feet, keep it and the skin around it clean and dry. You may clean these areas with mild soap and water. Do not clean the area with peroxide, alcohol, or iodine.  If you have a wound, scrape, corn, or callus on your foot, look at it several times a day to make sure it is healing and not infected. Check for: ? Redness, swelling, or pain. ? Fluid or blood. ? Warmth. ? Pus or a bad smell. General instructions  Do not cross your legs. This may decrease blood flow to your feet.  Do not use heating pads or hot water bottles on your feet. They may burn your skin. If you have lost feeling in your feet or legs, you may not know this is happening until it is too late.  Protect your feet from hot and cold by wearing shoes, such as at the beach or on hot pavement.  Schedule a complete foot exam at least once a year (annually) or more often if you have foot problems. If you have foot problems, report any cuts, sores, or bruises to your health care provider immediately. Contact a health care provider if:  You have a medical condition that increases your risk of infection and you have any cuts, sores, or bruises on your feet.  You have an injury that is not   healing.  You have redness on your legs or feet.  You feel burning or tingling in your legs or feet.  You have pain or cramps in your legs and feet.  Your legs or feet are numb.  Your feet always feel cold.  You have pain around a toenail. Get help right away if:  You have a wound, scrape, corn, or callus on your foot and: ? You have pain, swelling, or redness that gets worse. ? You have fluid or blood coming from the wound, scrape, corn, or callus. ? Your wound, scrape, corn, or callus feels warm to the touch. ? You have pus or a bad smell coming from the wound, scrape, corn, or callus. ? You have a fever. ? You have a red line going up your leg. Summary  Check your feet every day  for cuts, sores, red spots, swelling, and blisters.  Moisturize feet and legs daily.  Wear shoes that fit properly and have enough cushioning.  If you have foot problems, report any cuts, sores, or bruises to your health care provider immediately.  Schedule a complete foot exam at least once a year (annually) or more often if you have foot problems. This information is not intended to replace advice given to you by your health care provider. Make sure you discuss any questions you have with your health care provider. Document Released: 04/22/2000 Document Revised: 06/07/2017 Document Reviewed: 05/27/2016 Elsevier Interactive Patient Education  2019 [employer]  Onychomycosis/Fungal Toenails  WHAT IS IT? An infection that lies within the keratin of your nail plate that is caused by a fungus.  WHY ME? Fungal infections affect all ages, sexes, races, and creeds.  There may be many factors that predispose you to a fungal infection such as age, coexisting medical conditions such as diabetes, or an autoimmune disease; stress, medications, fatigue, genetics, etc.  Bottom line: fungus thrives in a warm, moist environment and your shoes offer such a location.  IS IT CONTAGIOUS? Theoretically, yes.  You do not want to share shoes, nail clippers or files with someone who has fungal toenails.  Walking around barefoot in the same room or sleeping in the same bed is unlikely to transfer the organism.  It is important to realize, however, that fungus can spread easily from one nail to the next on the same foot.  HOW DO WE TREAT THIS?  There are several ways to treat this condition.  Treatment may depend on many factors such as age, medications, pregnancy, liver and kidney conditions, etc.  It is best to ask your doctor which options are available to you.  1. No treatment.   Unlike many other medical concerns, you can live with this condition.  However for many people this can be a painful condition and  may lead to ingrown toenails or a bacterial infection.  It is recommended that you keep the nails cut short to help reduce the amount of fungal nail. 2. Topical treatment.  These range from herbal remedies to prescription strength nail lacquers.  About 40-50% effective, topicals require twice daily application for approximately 9 to 12 months or until an entirely new nail has grown out.  The most effective topicals are medical grade medications available through physicians offices. 3. Oral antifungal medications.  With an 80-90% cure rate, the most common oral medication requires 3 to 4 months of therapy and stays in your system for a year as the new nail grows out.  Oral antifungal medications do require   blood work to make sure it is a safe drug for you.  A liver function panel will be performed prior to starting the medication and after the first month of treatment.  It is important to have the blood work performed to avoid any harmful side effects.  In general, this medication safe but blood work is required. 4. Laser Therapy.  This treatment is performed by applying a specialized laser to the affected nail plate.  This therapy is noninvasive, fast, and non-painful.  It is not covered by insurance and is therefore, out of pocket.  The results have been very good with a 80-95% cure rate.  The [facility] is the only practice in the area to offer this therapy. 5. Permanent Nail Avulsion.  Removing the entire nail so that a new nail will not grow back. 

## 2018-09-11 NOTE — Progress Notes (Signed)
Subjective: [name] presents to clinic for preventative diabetic foot care.  Patient has history of diabetic neuropathy.  He is seen for painful, mycotic toenails and plantar calluses.  Plantar calluses pose a risk for him because he is diabetic.  His painful toenails interfere with activities of daily living. Pain is aggravated when wearing enclosed shoe gear. Pain is relieved with periodic professional debridement.  [name], [name], MD is his PCP.  Last visit 6 months ago.  Today [name] is requesting diabetic shoes.   Current Outpatient Medications:  .  acetaminophen (TYLENOL) 500 MG tablet, Take 500 mg by mouth every 6 (six) hours as needed for mild pain or moderate pain., Disp: , Rfl:  .  amLODipine (NORVASC) 10 MG tablet, Take 10 mg by mouth daily., Disp: , Rfl: 0 .  aspirin EC 81 MG EC tablet, Take 1 tablet (81 mg total) by mouth daily., Disp: , Rfl:  .  atorvastatin (LIPITOR) 20 MG tablet, Take 20 mg by mouth at bedtime., Disp: , Rfl:  .  ferrous gluconate (FERGON) 324 MG tablet, Take 1 tablet (324 mg total) by mouth daily with breakfast., Disp: 30 tablet, Rfl: 3 .  FLUZONE HIGH-DOSE 0.5 ML injection, Inject as directed once., Disp: , Rfl: 0 .  lisinopril-hydrochlorothiazide (PRINZIDE,ZESTORETIC) 20-25 MG per tablet, Take 1 tablet by mouth daily., Disp: , Rfl:  .  metFORMIN (GLUCOPHAGE) 1000 MG tablet, TK 1 T PO BID WC, Disp: , Rfl:  .  metFORMIN (GLUCOPHAGE) 500 MG tablet, Take 500 mg by mouth 2 (two) times daily with a meal. Take 500mg  by mouth twice daily, Disp: , Rfl:  .  SF 5000 PLUS 1.1 % CREA dental cream, BRUSH TEETH HS WITH TOOTHPASTE THEN EXPECTORATE, Disp: , Rfl: 2  Allergies  Allergen Reactions  . Crestor [Rosuvastatin]     Discomfort, RESTLESS, CAN'T SLEEP Discomfort, RESTLESS, CAN'T SLEEP Discomfort, RESTLESS, CAN'T SLEEP   . Vytorin [Ezetimibe-Simvastatin]     Leg pains Restlessness at night      Vascular Examination: Capillary refill time less than 3  seconds x 10 digits.  Dorsalis pedis pulses palpable bilaterally.  Posterior tibial pulses diminished bilaterally.  Digital hair absent x 10 digits.  Skin temperature gradient warm to cool bilaterally.  Dermatological Examination: Skin with normal turgor, texture and tone b/l.  Toenails 1-5 b/l discolored, thick, dystrophic with subungual debris and pain with palpation to nailbeds due to thickness of nails.  Hyperkeratotic lesions submetatarsal head 2 left foot, submetatarsal heads 3, 4 right foot, interdigital hyperkeratotic lesion noted medial aspect left second digit and distal tip right fourth digit.  There is no erythema, no edema, no drainage, no flocculence noted with either of these lesions.   Musculoskeletal: Muscle strength 5/5 to all LE muscle groups.  Hallux abductovalgus with bunion deformity bilaterally.  Hammertoe deformity bilateral second digits.  Neurological: Sensation diminished 1/5 bilaterally with 10 gram monofilament.  Vibratory sensation diminished bilaterally.  Assessment: 1. Painful onychomycosis toenails 1-5 b/l 2. Calluses submetatarsal head 2 left foot, submetatarsal heads 3, 4 right foot 3. Corns medial aspect left second digit, distal tip right fourth digit 4. NIDDM with Diabetic neuropathy  Plan: 1. Continue diabetic foot care principles. Literature dispensed on today. 2. Toenails 1-5 b/l were debrided in length and girth without iatrogenic bleeding. 3. Calluses pared submetatarsal head 2 left foot, submetatarsal heads 3, 4 right foot utilizing sterile scalpel blade without incident. Corn(s) paredmedial aspect left second digit, distal tip right fourth digit utilizing sterile scalpel  blade without incident.  4. Patient to continue soft, supportive shoe gear.  We will initiate process for diabetic shoes.  Qualifying diagnoses include NIDDM with neuropathy, calluses submetatarsal head 2 left foot, submetatarsal heads 3, 4 right foot, corn medial  aspect left second digit, corn distal tip right fourth digit, hallux abductovalgus with bunion deformity, hammertoes. 5. Patient to report any pedal injuries to medical professional immediately. 6. Follow up 3 months.  7. Patient/POA to call should there be a concern in the interim.

## 2018-09-13 ENCOUNTER — Encounter: Payer: Self-pay | Admitting: Podiatry

## 2018-09-27 ENCOUNTER — Ambulatory Visit: Payer: Medicare Other | Admitting: Orthotics

## 2018-09-27 ENCOUNTER — Other Ambulatory Visit: Payer: Self-pay

## 2018-09-27 DIAGNOSIS — B351 Tinea unguium: Secondary | ICD-10-CM

## 2018-09-27 DIAGNOSIS — M79674 Pain in right toe(s): Secondary | ICD-10-CM

## 2018-09-27 DIAGNOSIS — E1140 Type 2 diabetes mellitus with diabetic neuropathy, unspecified: Secondary | ICD-10-CM

## 2018-09-27 DIAGNOSIS — M2042 Other hammer toe(s) (acquired), left foot: Secondary | ICD-10-CM

## 2018-09-27 DIAGNOSIS — M79671 Pain in right foot: Secondary | ICD-10-CM

## 2018-09-27 DIAGNOSIS — E1142 Type 2 diabetes mellitus with diabetic polyneuropathy: Secondary | ICD-10-CM

## 2018-09-27 DIAGNOSIS — L84 Corns and callosities: Secondary | ICD-10-CM

## 2018-09-27 NOTE — Progress Notes (Signed)

## 2018-09-28 DIAGNOSIS — E785 Hyperlipidemia, unspecified: Secondary | ICD-10-CM | POA: Diagnosis not present

## 2018-09-28 DIAGNOSIS — I350 Nonrheumatic aortic (valve) stenosis: Secondary | ICD-10-CM | POA: Diagnosis not present

## 2018-09-28 DIAGNOSIS — C220 Liver cell carcinoma: Secondary | ICD-10-CM | POA: Diagnosis not present

## 2018-09-28 DIAGNOSIS — I10 Essential (primary) hypertension: Secondary | ICD-10-CM | POA: Diagnosis not present

## 2018-09-28 DIAGNOSIS — Z08 Encounter for follow-up examination after completed treatment for malignant neoplasm: Secondary | ICD-10-CM | POA: Diagnosis not present

## 2018-09-28 DIAGNOSIS — Z8505 Personal history of malignant neoplasm of liver: Secondary | ICD-10-CM | POA: Diagnosis not present

## 2018-09-28 DIAGNOSIS — Z79899 Other long term (current) drug therapy: Secondary | ICD-10-CM | POA: Diagnosis not present

## 2018-09-28 DIAGNOSIS — Z7984 Long term (current) use of oral hypoglycemic drugs: Secondary | ICD-10-CM | POA: Diagnosis not present

## 2018-09-28 DIAGNOSIS — Z87891 Personal history of nicotine dependence: Secondary | ICD-10-CM | POA: Diagnosis not present

## 2018-09-28 DIAGNOSIS — E119 Type 2 diabetes mellitus without complications: Secondary | ICD-10-CM | POA: Diagnosis not present

## 2018-11-06 ENCOUNTER — Telehealth: Payer: Self-pay | Admitting: Podiatry

## 2018-11-06 DIAGNOSIS — C220 Liver cell carcinoma: Secondary | ICD-10-CM | POA: Diagnosis not present

## 2018-11-06 DIAGNOSIS — Z952 Presence of prosthetic heart valve: Secondary | ICD-10-CM | POA: Diagnosis not present

## 2018-11-06 DIAGNOSIS — I10 Essential (primary) hypertension: Secondary | ICD-10-CM | POA: Diagnosis not present

## 2018-11-06 DIAGNOSIS — E119 Type 2 diabetes mellitus without complications: Secondary | ICD-10-CM | POA: Diagnosis not present

## 2018-11-06 DIAGNOSIS — Z0001 Encounter for general adult medical examination with abnormal findings: Secondary | ICD-10-CM | POA: Diagnosis not present

## 2018-11-06 DIAGNOSIS — Z79899 Other long term (current) drug therapy: Secondary | ICD-10-CM | POA: Diagnosis not present

## 2018-11-06 DIAGNOSIS — Z7984 Long term (current) use of oral hypoglycemic drugs: Secondary | ICD-10-CM | POA: Diagnosis not present

## 2018-11-06 DIAGNOSIS — E7800 Pure hypercholesterolemia, unspecified: Secondary | ICD-10-CM | POA: Diagnosis not present

## 2018-11-06 NOTE — Telephone Encounter (Signed)
Called pt and made him aware the shoes are on back order. I offered him to come in to pick out a pair and he is going to think about it and let me know if he wants to change.

## 2018-11-15 DIAGNOSIS — E7800 Pure hypercholesterolemia, unspecified: Secondary | ICD-10-CM | POA: Diagnosis not present

## 2018-11-15 DIAGNOSIS — E119 Type 2 diabetes mellitus without complications: Secondary | ICD-10-CM | POA: Diagnosis not present

## 2018-11-15 DIAGNOSIS — Z79899 Other long term (current) drug therapy: Secondary | ICD-10-CM | POA: Diagnosis not present

## 2018-12-07 ENCOUNTER — Ambulatory Visit: Payer: Medicare Other | Admitting: Podiatry

## 2018-12-10 ENCOUNTER — Encounter: Payer: Self-pay | Admitting: Podiatry

## 2018-12-10 ENCOUNTER — Other Ambulatory Visit: Payer: Self-pay

## 2018-12-10 ENCOUNTER — Ambulatory Visit (INDEPENDENT_AMBULATORY_CARE_PROVIDER_SITE_OTHER): Payer: Medicare Other | Admitting: Podiatry

## 2018-12-10 ENCOUNTER — Ambulatory Visit (INDEPENDENT_AMBULATORY_CARE_PROVIDER_SITE_OTHER): Payer: Medicare Other | Admitting: Orthotics

## 2018-12-10 VITALS — Temp 98.6°F

## 2018-12-10 DIAGNOSIS — L84 Corns and callosities: Secondary | ICD-10-CM | POA: Diagnosis not present

## 2018-12-10 DIAGNOSIS — M79675 Pain in left toe(s): Secondary | ICD-10-CM | POA: Diagnosis not present

## 2018-12-10 DIAGNOSIS — Q828 Other specified congenital malformations of skin: Secondary | ICD-10-CM

## 2018-12-10 DIAGNOSIS — M2042 Other hammer toe(s) (acquired), left foot: Secondary | ICD-10-CM | POA: Diagnosis not present

## 2018-12-10 DIAGNOSIS — E1140 Type 2 diabetes mellitus with diabetic neuropathy, unspecified: Secondary | ICD-10-CM

## 2018-12-10 DIAGNOSIS — E1141 Type 2 diabetes mellitus with diabetic mononeuropathy: Secondary | ICD-10-CM | POA: Diagnosis not present

## 2018-12-10 DIAGNOSIS — E1149 Type 2 diabetes mellitus with other diabetic neurological complication: Secondary | ICD-10-CM

## 2018-12-10 DIAGNOSIS — M2011 Hallux valgus (acquired), right foot: Secondary | ICD-10-CM

## 2018-12-10 DIAGNOSIS — B351 Tinea unguium: Secondary | ICD-10-CM

## 2018-12-10 DIAGNOSIS — M79674 Pain in right toe(s): Secondary | ICD-10-CM

## 2018-12-10 DIAGNOSIS — M2012 Hallux valgus (acquired), left foot: Secondary | ICD-10-CM

## 2018-12-10 DIAGNOSIS — M2041 Other hammer toe(s) (acquired), right foot: Secondary | ICD-10-CM | POA: Diagnosis not present

## 2018-12-10 DIAGNOSIS — M6788 Other specified disorders of synovium and tendon, other site: Secondary | ICD-10-CM

## 2018-12-10 NOTE — Progress Notes (Signed)

## 2018-12-10 NOTE — Patient Instructions (Signed)
Diabetes Mellitus and Foot Care Foot care is an important part of your health, especially when you have diabetes. Diabetes may cause you to have problems because of poor blood flow (circulation) to your feet and legs, which can cause your skin to:  Become thinner and drier.  Break more easily.  Heal more slowly.  Peel and crack. You may also have nerve damage (neuropathy) in your legs and feet, causing decreased feeling in them. This means that you may not notice minor injuries to your feet that could lead to more serious problems. Noticing and addressing any potential problems early is the best way to prevent future foot problems. How to care for your feet Foot hygiene  Wash your feet daily with warm water and mild soap. Do not use hot water. Then, pat your feet and the areas between your toes until they are completely dry. Do not soak your feet as this can dry your skin.  Trim your toenails straight across. Do not dig under them or around the cuticle. File the edges of your nails with an emery board or nail file.  Apply a moisturizing lotion or petroleum jelly to the skin on your feet and to dry, brittle toenails. Use lotion that does not contain alcohol and is unscented. Do not apply lotion between your toes. Shoes and socks  Wear clean socks or stockings every day. Make sure they are not too tight. Do not wear knee-high stockings since they may decrease blood flow to your legs.  Wear shoes that fit properly and have enough cushioning. Always look in your shoes before you put them on to be sure there are no objects inside.  To break in new shoes, wear them for just a few hours a day. This prevents injuries on your feet. Wounds, scrapes, corns, and calluses  Check your feet daily for blisters, cuts, bruises, sores, and redness. If you cannot see the bottom of your feet, use a mirror or ask someone for help.  Do not cut corns or calluses or try to remove them with medicine.  If you  find a minor scrape, cut, or break in the skin on your feet, keep it and the skin around it clean and dry. You may clean these areas with mild soap and water. Do not clean the area with peroxide, alcohol, or iodine.  If you have a wound, scrape, corn, or callus on your foot, look at it several times a day to make sure it is healing and not infected. Check for: ? Redness, swelling, or pain. ? Fluid or blood. ? Warmth. ? Pus or a bad smell. General instructions  Do not cross your legs. This may decrease blood flow to your feet.  Do not use heating pads or hot water bottles on your feet. They may burn your skin. If you have lost feeling in your feet or legs, you may not know this is happening until it is too late.  Protect your feet from hot and cold by wearing shoes, such as at the beach or on hot pavement.  Schedule a complete foot exam at least once a year (annually) or more often if you have foot problems. If you have foot problems, report any cuts, sores, or bruises to your health care provider immediately. Contact a health care provider if:  You have a medical condition that increases your risk of infection and you have any cuts, sores, or bruises on your feet.  You have an injury that is not   healing.  You have redness on your legs or feet.  You feel burning or tingling in your legs or feet.  You have pain or cramps in your legs and feet.  Your legs or feet are numb.  Your feet always feel cold.  You have pain around a toenail. Get help right away if:  You have a wound, scrape, corn, or callus on your foot and: ? You have pain, swelling, or redness that gets worse. ? You have fluid or blood coming from the wound, scrape, corn, or callus. ? Your wound, scrape, corn, or callus feels warm to the touch. ? You have pus or a bad smell coming from the wound, scrape, corn, or callus. ? You have a fever. ? You have a red line going up your leg. Summary  Check your feet every day  for cuts, sores, red spots, swelling, and blisters.  Moisturize feet and legs daily.  Wear shoes that fit properly and have enough cushioning.  If you have foot problems, report any cuts, sores, or bruises to your health care provider immediately.  Schedule a complete foot exam at least once a year (annually) or more often if you have foot problems. This information is not intended to replace advice given to you by your health care provider. Make sure you discuss any questions you have with your health care provider. Document Released: 04/22/2000 Document Revised: 06/07/2017 Document Reviewed: 05/27/2016 Elsevier Patient Education  2020 [employer] Inc.  

## 2018-12-10 NOTE — Progress Notes (Signed)
Subjective: [name] presents to clinic for preventative diabetic foot care. He is seen for f/o of chronic painful, mycotic toenails as well as chronic plantar callus.   He is also here to pick up his diabetic shoes on today's visit.  [name], [name], MD is his PCP. Patient states last visit was 2 months ago.   Current Outpatient Medications:  .  acetaminophen (TYLENOL) 500 MG tablet, Take 500 mg by mouth every 6 (six) hours as needed for mild pain or moderate pain., Disp: , Rfl:  .  amLODipine (NORVASC) 10 MG tablet, Take 10 mg by mouth daily., Disp: , Rfl: 0 .  aspirin EC 81 MG EC tablet, Take 1 tablet (81 mg total) by mouth daily., Disp: , Rfl:  .  atorvastatin (LIPITOR) 20 MG tablet, Take 20 mg by mouth at bedtime., Disp: , Rfl:  .  ferrous gluconate (FERGON) 324 MG tablet, Take 1 tablet (324 mg total) by mouth daily with breakfast., Disp: 30 tablet, Rfl: 3 .  FLUZONE HIGH-DOSE 0.5 ML injection, Inject as directed once., Disp: , Rfl: 0 .  lisinopril-hydrochlorothiazide (PRINZIDE,ZESTORETIC) 20-25 MG per tablet, Take 1 tablet by mouth daily., Disp: , Rfl:  .  metFORMIN (GLUCOPHAGE) 1000 MG tablet, TK 1 T PO BID WC, Disp: , Rfl:  .  metFORMIN (GLUCOPHAGE) 500 MG tablet, Take 500 mg by mouth 2 (two) times daily with a meal. Take 500mg  by mouth twice daily, Disp: , Rfl:  .  SF 5000 PLUS 1.1 % CREA dental cream, BRUSH TEETH HS WITH TOOTHPASTE THEN EXPECTORATE, Disp: , Rfl: 2  Allergies  Allergen Reactions  . Crestor [Rosuvastatin]     Discomfort, RESTLESS, CAN'T SLEEP Discomfort, RESTLESS, CAN'T SLEEP Discomfort, RESTLESS, CAN'T SLEEP   . Vytorin [Ezetimibe-Simvastatin]     Leg pains Restlessness at night      Objective: Vitals:   12/10/18 0917  Temp: 98.6 F (37 C)    Vascular Examination: Capillary refill time <3 seconds x 10 digits.  Dorsalis pedis pulses palpable b/l.  Posterior tibial pulses diminished b/l.  Digital hair absent x 10 digits.  Skin temperature  gradient warm to cool b/l.  Dermatological Examination: Skin with normal turgor, texture and tone b/l.  Toenails 1-5 b/l discolored, thick, dystrophic with subungual debris and pain with palpation to nailbeds due to thickness of nails.  Hyperkeratotic lesions submet head 2 left foot, submet heads 3, 4 right foot, medial 2nd PIPJ left foot and distal tip right 4th digit. No erythema, no edema, no drainage, no flocculence noted.   Musculoskeletal: Muscle strength 5/5 to all LE muscle groups.  HAV with bunion b/l.  Hammertoe deformity b/l 2nd digits.  No pain, crepitus or joint limitation with passive/active ROM.  Neurological: Sensation diminished 1/5 b/l with 10 gram monofilament.  Assessment: 1. Painful onychomycosis toenails 1-5 b/l 2. Calluses submet head 2 left foot, submet heads 3, 4 right foot 3. Corns medial 2nd PIPJ left foot and distal tip right 4th digit 4. NIDDM with Diabetic neuropathy  Plan: 1. Continue diabetic foot care principles. Literature dispensed on today. 2. Toenails 1-5 b/l were debrided in length and girth without iatrogenic bleeding. 3. Calluses pared submet heads 2 left foot, submet heads 3, 4 right foot utilizing sterile scalpel blade without incident.  4. Corn(s) pared medial 2nd PIPJ left foot and distal tip right 4th digit utilizing sterile scalpel blade without incident.  5. He received his diabetic shoes on today's visit.  Reviewed diabetic shoe break in process with  Mr. Hattabaugh. 6. Patient to report any pedal injuries to medical professional  7. Follow up 10 weeks. 8. Patient/POA to call should there be a concern in the interim.

## 2019-01-04 DIAGNOSIS — Z8505 Personal history of malignant neoplasm of liver: Secondary | ICD-10-CM | POA: Diagnosis not present

## 2019-01-04 DIAGNOSIS — Z9889 Other specified postprocedural states: Secondary | ICD-10-CM | POA: Diagnosis not present

## 2019-01-04 DIAGNOSIS — C220 Liver cell carcinoma: Secondary | ICD-10-CM | POA: Diagnosis not present

## 2019-01-04 DIAGNOSIS — K769 Liver disease, unspecified: Secondary | ICD-10-CM | POA: Diagnosis not present

## 2019-01-24 DIAGNOSIS — Z8679 Personal history of other diseases of the circulatory system: Secondary | ICD-10-CM | POA: Diagnosis not present

## 2019-01-24 DIAGNOSIS — B182 Chronic viral hepatitis C: Secondary | ICD-10-CM | POA: Diagnosis not present

## 2019-01-24 DIAGNOSIS — Z952 Presence of prosthetic heart valve: Secondary | ICD-10-CM | POA: Diagnosis not present

## 2019-01-24 DIAGNOSIS — Z8505 Personal history of malignant neoplasm of liver: Secondary | ICD-10-CM | POA: Diagnosis not present

## 2019-01-24 DIAGNOSIS — E119 Type 2 diabetes mellitus without complications: Secondary | ICD-10-CM | POA: Diagnosis not present

## 2019-01-24 DIAGNOSIS — K7460 Unspecified cirrhosis of liver: Secondary | ICD-10-CM | POA: Diagnosis not present

## 2019-02-04 DIAGNOSIS — K7460 Unspecified cirrhosis of liver: Secondary | ICD-10-CM | POA: Diagnosis not present

## 2019-02-04 DIAGNOSIS — Z8619 Personal history of other infectious and parasitic diseases: Secondary | ICD-10-CM | POA: Diagnosis not present

## 2019-02-04 DIAGNOSIS — Z8505 Personal history of malignant neoplasm of liver: Secondary | ICD-10-CM | POA: Diagnosis not present

## 2019-02-05 DIAGNOSIS — Z23 Encounter for immunization: Secondary | ICD-10-CM | POA: Diagnosis not present

## 2019-02-19 ENCOUNTER — Encounter: Payer: Self-pay | Admitting: Podiatry

## 2019-02-19 ENCOUNTER — Ambulatory Visit (INDEPENDENT_AMBULATORY_CARE_PROVIDER_SITE_OTHER): Payer: Medicare Other

## 2019-02-19 ENCOUNTER — Ambulatory Visit (INDEPENDENT_AMBULATORY_CARE_PROVIDER_SITE_OTHER): Payer: Medicare Other | Admitting: Podiatry

## 2019-02-19 ENCOUNTER — Other Ambulatory Visit: Payer: Self-pay

## 2019-02-19 DIAGNOSIS — L97521 Non-pressure chronic ulcer of other part of left foot limited to breakdown of skin: Secondary | ICD-10-CM

## 2019-02-19 DIAGNOSIS — E1142 Type 2 diabetes mellitus with diabetic polyneuropathy: Secondary | ICD-10-CM

## 2019-02-19 DIAGNOSIS — M79674 Pain in right toe(s): Secondary | ICD-10-CM | POA: Diagnosis not present

## 2019-02-19 DIAGNOSIS — E11621 Type 2 diabetes mellitus with foot ulcer: Secondary | ICD-10-CM | POA: Diagnosis not present

## 2019-02-19 DIAGNOSIS — M79675 Pain in left toe(s): Secondary | ICD-10-CM

## 2019-02-19 DIAGNOSIS — B351 Tinea unguium: Secondary | ICD-10-CM | POA: Diagnosis not present

## 2019-02-19 MED ORDER — CEPHALEXIN 500 MG PO CAPS: 500.0000 mg | ORAL_CAPSULE | Freq: Three times a day (TID) | ORAL | 0 refills | Status: AC

## 2019-02-19 MED ORDER — MUPIROCIN 2 % EX OINT: TOPICAL_OINTMENT | CUTANEOUS | 0 refills | Status: AC

## 2019-02-19 NOTE — Patient Instructions (Signed)
DRESSING CHANGES LEFT SECOND TOE:  WEAR SURGICAL SHOE AT ALL TIMES    1. KEEP LEFT SECOND TOE DRY AT ALL TIMES!!!!  2. CLEANSE ULCER WITH SALINE.  3. DAB DRY WITH GAUZE SPONGE.  4. APPLY A LIGHT AMOUNT OF MUPIROCIN TO BASE OF ULCER.  5. APPLY OUTER DRESSING/BAND-AID AS INSTRUCTED.  6. WEAR SURGICAL SHOE DAILY AT ALL TIMES.  7. DO NOT WALK BAREFOOT!!!  8.  IF YOU EXPERIENCE ANY FEVER, CHILLS, NIGHTSWEATS, NAUSEA OR VOMITING, ELEVATED OR LOW BLOOD SUGARS, REPORT TO EMERGENCY ROOM.  9. IF YOU EXPERIENCE INCREASED REDNESS, PAIN, SWELLING, DISCOLORATION, ODOR, PUS, DRAINAGE OR WARMTH OF YOUR FOOT, REPORT TO EMERGENCY ROOM.

## 2019-02-19 NOTE — Progress Notes (Signed)
Subjective: Patient presents today for preventative diabetic foot care. Patient states his left 2nd digit interdigital corn is painful today. He states it started getting tender about 2 weeks ago. He relates having to walk on his heel to avoid pressure to the digit. He denies any drainage or swelling of digit.   [name], [name], MD is his PCP.   Current Outpatient Medications on File Prior to Visit  Medication Sig Dispense Refill  . acetaminophen (TYLENOL) 500 MG tablet Take 500 mg by mouth every 6 (six) hours as needed for mild pain or moderate pain.    Marland Kitchen amLODipine (NORVASC) 10 MG tablet Take 10 mg by mouth daily.  0  . aspirin EC 81 MG EC tablet Take 1 tablet (81 mg total) by mouth daily.    Marland Kitchen atorvastatin (LIPITOR) 20 MG tablet Take 20 mg by mouth at bedtime.    . ferrous gluconate (FERGON) 324 MG tablet Take 1 tablet (324 mg total) by mouth daily with breakfast. 30 tablet 3  . FLUZONE HIGH-DOSE 0.5 ML injection Inject as directed once.  0  . lisinopril-hydrochlorothiazide (PRINZIDE,ZESTORETIC) 20-25 MG per tablet Take 1 tablet by mouth daily.    . metFORMIN (GLUCOPHAGE) 1000 MG tablet TK 1 T PO BID WC    . metFORMIN (GLUCOPHAGE) 500 MG tablet Take 500 mg by mouth 2 (two) times daily with a meal. Take 500mg  by mouth twice daily    . SF 5000 PLUS 1.1 % CREA dental cream BRUSH TEETH HS WITH TOOTHPASTE THEN EXPECTORATE  2   No current facility-administered medications on file prior to visit.      Allergies  Allergen Reactions  . Crestor [Rosuvastatin]     Discomfort, RESTLESS, CAN'T SLEEP Discomfort, RESTLESS, CAN'T SLEEP Discomfort, RESTLESS, CAN'T SLEEP   . Vytorin [Ezetimibe-Simvastatin]     Leg pains Restlessness at night    Objective: Vascular Examination: Capillary refill time <3 seconds x 10 digits.  Dorsalis pedis pulses palpable b/l.  Posterior tibial pulses faintly palpable b/l.  Digital hair absent b/l.   Skin temperature gradient WNL b/l.  Dermatological  Examination: Skin thin, shiny and atrophic b/l.  Toenails 1-5 b/l discolored, thick, dystrophic with subungual debris and pain with palpation to nailbeds due to thickness of nails.  Ulceration located medial aspect left 2nd digit.  Predebridement measurements carried out today of 1.0 x 1.0 cm with hyperkeratotic roof and tenderness to palpation..  No erythema, no edema, no drainage. No digital warmth.  Postdebridement measurements today are: 1.0 x 1.0 x 0.1 cm x 0.1 cm. +Serous drainage expressed. No undermining, no tunneling, no probing to bone, no undermining  No deep abscess nor odor was encountered.    Musculoskeletal: Muscle strength 5/5 to all LE muscle groups.  HAV with bunion deformity b/l.  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  Neurological: Sensation diminished wiith 10 gram monofilament.  Assessment: 1. Painful onychomycosis toenails 1-5 b/l 2.   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   NIDDM with peripheral neuropathy  Plan: 1. Toenails 1-5 b/l were debrided in length and girth without iatrogenic bleeding. 2. Ulcer was debrided of reactive hyperkeratoses and necrotic tissue was resected to the level of bleeding or viable tissue. Ulcer was cleansed with wound cleanser. Triple antibiotic ointment  was applied to base of wound with light dressing. 3. Prescription sent to pharmacy for Keflex 500 mg. Take 1 capsule (500 mg total) by mouth three times daily for 10 days. 4. Prescription written for Mupirocin Ointment. Patient is to apply to  left 2nd digit once daily and cover with dressing.  5. Xray was performed and reviewed with patient. 6. Surgical shoe was dispensed for left foot. 7. Patient was given written instructions on offloading and daily Mupirocin Ointment dressings and was instructed to call immediately if any signs or symptoms of infection arise.  8. Patient instructed to report to emergency department with worsening appearance of ulcer/toe/foot, increased pain, foul odor,  increased redness, swelling, drainage, fever, chills, nightsweats, nausea, vomiting, increased blood sugar.  9. Patient/POA related understanding. 10. Follow up 1 week with Dr. Posey Pronto.  Follow up 9 weeks with me for routine care. 11. Patient/POA to call should there be a concern in the interim.

## 2019-02-19 NOTE — Patient Instructions (Signed)

## 2019-02-26 ENCOUNTER — Other Ambulatory Visit: Payer: Self-pay

## 2019-02-26 ENCOUNTER — Ambulatory Visit (INDEPENDENT_AMBULATORY_CARE_PROVIDER_SITE_OTHER): Payer: Medicare Other | Admitting: Podiatry

## 2019-02-26 VITALS — Temp 99.3°F

## 2019-02-26 DIAGNOSIS — L84 Corns and callosities: Secondary | ICD-10-CM

## 2019-02-26 DIAGNOSIS — E1142 Type 2 diabetes mellitus with diabetic polyneuropathy: Secondary | ICD-10-CM | POA: Diagnosis not present

## 2019-02-28 ENCOUNTER — Encounter: Payer: Self-pay | Admitting: Podiatry

## 2019-02-28 NOTE — Progress Notes (Signed)
Subjective:  Patient ID: [name], male    DOB: August 19, 1940,  MRN: [id number]  Chief Complaint  Patient presents with  . Wound Check    Left 2nd interdigital on medial aspect wound check. Pt states much improvement over past week and denies fever/chills/nausea/vomiting.    78 y.o. male presents with the above complaint.  Patient states that he has been very painful due to heloma molle between his left first digit lateral side and second digit medial side.  He is known to Dr. Adah Perl.  When he was seen by Dr. Adah Perl there was a ulceration between both of the digits.  But today he states that it has completely improved in 1 week.  He denies any nausea fever chills vomiting.   Review of Systems: Negative except as noted in the HPI. Denies N/V/F/Ch.  Past Medical History:  Diagnosis Date  . Acute epididymo-orchitis   . Aortic stenosis    s/p bioprosthetic AVR (Dr. Cyndia Bent) 11/2013  . Arthritis   . Diabetes mellitus without complication ([place])   . ED (erectile dysfunction)   . Essential hypertension, benign   . Heart murmur   . Hep C w/o coma, chronic ([place])   . History of blood transfusion   . History of DVT of lower extremity   . Hx of echocardiogram    Echo (8/15):  Severe LVH, EF 60-65%, no RWMA, Gr 3 DD, AVR ok (mean 21 mmHg), MAC, mild LAE, mild to mod RAE  . Hypercholesterolemia     Current Outpatient Medications:  .  acetaminophen (TYLENOL) 500 MG tablet, Take 500 mg by mouth every 6 (six) hours as needed for mild pain or moderate pain., Disp: , Rfl:  .  amLODipine (NORVASC) 10 MG tablet, Take 10 mg by mouth daily., Disp: , Rfl: 0 .  aspirin EC 81 MG EC tablet, Take 1 tablet (81 mg total) by mouth daily., Disp: , Rfl:  .  atorvastatin (LIPITOR) 20 MG tablet, Take 20 mg by mouth at bedtime., Disp: , Rfl:  .  cephALEXin (KEFLEX) 500 MG capsule, Take 1 capsule (500 mg total) by mouth 3 (three) times daily for 10 days., Disp: 30 capsule, Rfl: 0 .  ferrous gluconate (FERGON)  324 MG tablet, Take 1 tablet (324 mg total) by mouth daily with breakfast., Disp: 30 tablet, Rfl: 3 .  FLUZONE HIGH-DOSE 0.5 ML injection, Inject as directed once., Disp: , Rfl: 0 .  lisinopril-hydrochlorothiazide (PRINZIDE,ZESTORETIC) 20-25 MG per tablet, Take 1 tablet by mouth daily., Disp: , Rfl:  .  metFORMIN (GLUCOPHAGE) 1000 MG tablet, TK 1 T PO BID WC, Disp: , Rfl:  .  metFORMIN (GLUCOPHAGE) 500 MG tablet, Take 500 mg by mouth 2 (two) times daily with a meal. Take 580m by mouth twice daily, Disp: , Rfl:  .  mupirocin ointment (BACTROBAN) 2 %, APPLY TO LEFT  SECONDTOE ONCE DAILY WITH DRESSING, Disp: , Rfl:  .  SF 5000 PLUS 1.1 % CREA dental cream, BRUSH TEETH HS WITH TOOTHPASTE THEN EXPECTORATE, Disp: , Rfl: 2  Social History   Tobacco Use  Smoking Status Former Smoker  . Types: Cigarettes  . Quit date: 09/26/1986  . Years since quitting: 32.4  Smokeless Tobacco Never Used    Allergies  Allergen Reactions  . Crestor [Rosuvastatin]     Discomfort, RESTLESS, CAN'T SLEEP Discomfort, RESTLESS, CAN'T SLEEP Discomfort, RESTLESS, CAN'T SLEEP   . Vytorin [Ezetimibe-Simvastatin]     Leg pains Restlessness at night     Objective:  Vitals:   02/26/19 1634  Temp: 99.3 F (37.4 C)   There is no height or weight on file to calculate BMI. Constitutional Well developed. Well nourished.  Vascular Dorsalis pedis pulses palpable bilaterally. Posterior tibial pulses palpable bilaterally. Capillary refill normal to all digits.  No cyanosis or clubbing noted. Pedal hair growth normal.  Neurologic Normal speech. Oriented to person, place, and time. Epicritic sensation to light touch grossly present bilaterally.  Dermatologic  Mildly thickened hyperkeratotic lesion noted between the first and second digit heloma molle.  Debrided down without any underlying ulcerations.  Orthopedic: Normal joint ROM without pain or crepitus bilaterally. No visible deformities. No bony tenderness.    Radiographs: None Assessment:   1. Heloma molle   2. Diabetic peripheral neuropathy associated with type 2 diabetes mellitus ([place])    Plan:  Patient was evaluated and treated and all questions answered.  Left foot heloma molle in between lateral aspect of the hallux on medial aspect of the second digit -Using chisel blade, the lesion was debrided down to healthy granular strident tissue.  No ulceration or wound was noted.  It appears that the wound was reepithelialized. -I explained to the patient the etiology and all the available treatment options including surgical to fix this however at this time given that there is no normal underlying diabetes it was determined that patient will be treated with with debridements from the axilla. -She was given multiple toe spacers and toe protectors to help offload the area.  Return in about 3 months (around 05/29/2019).

## 2019-05-13 ENCOUNTER — Ambulatory Visit (INDEPENDENT_AMBULATORY_CARE_PROVIDER_SITE_OTHER): Payer: Medicare Other | Admitting: Cardiology

## 2019-05-13 ENCOUNTER — Other Ambulatory Visit: Payer: Self-pay

## 2019-05-13 ENCOUNTER — Encounter: Payer: Self-pay | Admitting: Cardiology

## 2019-05-13 VITALS — BP 160/70 | HR 66 | Ht 71.0 in | Wt 142.8 lb

## 2019-05-13 DIAGNOSIS — E782 Mixed hyperlipidemia: Secondary | ICD-10-CM | POA: Diagnosis not present

## 2019-05-13 DIAGNOSIS — I10 Essential (primary) hypertension: Secondary | ICD-10-CM | POA: Diagnosis not present

## 2019-05-13 DIAGNOSIS — E119 Type 2 diabetes mellitus without complications: Secondary | ICD-10-CM | POA: Diagnosis not present

## 2019-05-13 DIAGNOSIS — Z952 Presence of prosthetic heart valve: Secondary | ICD-10-CM | POA: Diagnosis not present

## 2019-05-13 NOTE — Progress Notes (Signed)
Cardiology Office Note:    Date:  05/13/2019   ID:  CLAIRE BRIDGE, DOB 1940-07-14, MRN [id number]  PCP:  Lujean Amel, MD  Cardiologist:  Candee Furbish, MD  Electrophysiologist:  None   Referring MD: Lujean Amel, MD     History of Present Illness:    [name] is a 79 y.o. male here for the follow-up of bioprosthetic aortic valve placed in 2015 with comorbidity of hepatocellular carcinoma.    Previously had treated hepatitis C.  Also noted to have bilateral carotid artery disease mild in 2015  It is hard for him to maintain his weight but he is eating.  He knows to take dental antibiotics with his aortic valve.  Previously he did note that he had some wooziness after taking his medications a while back.  We stopped his low-dose metoprolol at a prior visit.  Once again states that if he looks up quickly he may feel a little bit off.  Likely equilibrium.  Overall no fever chills nausea vomiting syncope bleeding.   Past Medical History:  Diagnosis Date  . Acute epididymo-orchitis   . Aortic stenosis    s/p bioprosthetic AVR (Dr. Cyndia Bent) 11/2013  . Arthritis   . Diabetes mellitus without complication ([place])   . ED (erectile dysfunction)   . Essential hypertension, benign   . Heart murmur   . Hep C w/o coma, chronic ([place])   . History of blood transfusion   . History of DVT of lower extremity   . Hx of echocardiogram    Echo (8/15):  Severe LVH, EF 60-65%, no RWMA, Gr 3 DD, AVR ok (mean 21 mmHg), MAC, mild LAE, mild to mod RAE  . Hypercholesterolemia     Past Surgical History:  Procedure Laterality Date  . AORTIC VALVE REPLACEMENT N/A 11/19/2013   Procedure: AORTIC VALVE REPLACEMENT (AVR);  Surgeon: Gaye Pollack, MD;  Location: [facility];  Service: Open Heart Surgery;  Laterality: N/A;  . BUNIONECTOMY Bilateral 2008  . CARDIAC CATHETERIZATION  10/01/13  . COLONOSCOPY  01/2010   internal hemorrhoids.  Dr Deatra Ina  . ESOPHAGOGASTRODUODENOSCOPY N/A 04/24/2013   Procedure:  ESOPHAGOGASTRODUODENOSCOPY (EGD);  Surgeon: Jerene Bears, MD;  Location: [facility] ENDOSCOPY;  Service: Endoscopy;  Laterality: N/A;  . INGUINAL HERNIA REPAIR Right   . INTRAOPERATIVE TRANSESOPHAGEAL ECHOCARDIOGRAM N/A 11/19/2013   Procedure: INTRAOPERATIVE TRANSESOPHAGEAL ECHOCARDIOGRAM;  Surgeon: Gaye Pollack, MD;  Location: [facility] OR;  Service: Open Heart Surgery;  Laterality: N/A;  . KNEE ARTHROSCOPY Left 05/2009   Dr Collier Salina  . LAMINECTOMY  06/2005   Dr Joya Salm. multiple lumbar level laminectomies.   Marland Kitchen LEFT AND RIGHT HEART CATHETERIZATION WITH CORONARY ANGIOGRAM N/A 10/01/2013   Procedure: LEFT AND RIGHT HEART CATHETERIZATION WITH CORONARY ANGIOGRAM;  Surgeon: Candee Furbish, MD;  Location: [facility] C[facility] LAB;  Service: Cardiovascular;  Laterality: N/A;  . TEE WITHOUT CARDIOVERSION  11/2003   Aoritic valve sclerosis.   . TONSILLECTOMY      Current Medications: Current Meds  Medication Sig  . acetaminophen (TYLENOL) 500 MG tablet Take 500 mg by mouth every 6 (six) hours as needed for mild pain or moderate pain.  Marland Kitchen amLODipine (NORVASC) 10 MG tablet Take 10 mg by mouth daily.  Marland Kitchen amoxicillin (AMOXIL) 500 MG capsule TK 4 CS PO 1 HOUR PRIOR TO DENTAL APPOINTMENT  . aspirin EC 81 MG EC tablet Take 1 tablet (81 mg total) by mouth daily.  Marland Kitchen atorvastatin (LIPITOR) 20 MG tablet Take 20 mg by mouth at  bedtime.  . ferrous gluconate (FERGON) 324 MG tablet Take 1 tablet (324 mg total) by mouth daily with breakfast.  . FLUZONE HIGH-DOSE 0.5 ML injection Inject as directed once.  Marland Kitchen lisinopril-hydrochlorothiazide (PRINZIDE,ZESTORETIC) 20-25 MG per tablet Take 1 tablet by mouth daily.  . metFORMIN (GLUCOPHAGE) 500 MG tablet Take 500 mg by mouth 2 (two) times daily with a meal. Take 551m by mouth twice daily  . mupirocin ointment (BACTROBAN) 2 % APPLY TO LEFT  SECONDTOE ONCE DAILY WITH DRESSING  . SF 5000 PLUS 1.1 % CREA dental cream BRUSH TEETH HS WITH TOOTHPASTE THEN EXPECTORATE     Allergies:   Crestor [rosuvastatin]  and Vytorin [ezetimibe-simvastatin]   Social History   Socioeconomic History  . Marital status: Married    Spouse name: Not on file  . Number of children: Not on file  . Years of education: Not on file  . Highest education level: Not on file  Occupational History  . Not on file  Tobacco Use  . Smoking status: Former Smoker    Types: Cigarettes    Quit date: 09/26/1986    Years since quitting: 32.6  . Smokeless tobacco: Never Used  Substance and Sexual Activity  . Alcohol use: No  . Drug use: No  . Sexual activity: Not on file  Other Topics Concern  . Not on file  Social History Narrative  . Not on file   Social Determinants of Health   Financial Resource Strain:   . Difficulty of Paying Living Expenses: Not on file  Food Insecurity:   . Worried About RCharity fundraiserin the Last Year: Not on file  . Ran Out of Food in the Last Year: Not on file  Transportation Needs:   . Lack of Transportation (Medical): Not on file  . Lack of Transportation (Non-Medical): Not on file  Physical Activity:   . Days of Exercise per Week: Not on file  . Minutes of Exercise per Session: Not on file  Stress:   . Feeling of Stress : Not on file  Social Connections:   . Frequency of Communication with Friends and Family: Not on file  . Frequency of Social Gatherings with Friends and Family: Not on file  . Attends Religious Services: Not on file  . Active Member of Clubs or Organizations: Not on file  . Attends CArchivistMeetings: Not on file  . Marital Status: Not on file     Family History: The patient's family history includes Diabetes in his father and mother; Hypertension in his unknown relative; Stroke in his father and mother. There is no history of Heart attack.  ROS:   Please see the history of present illness.    Denies any syncope bleeding orthopnea PND all other systems reviewed and are negative.  EKGs/Labs/Other Studies Reviewed:    The following studies  were reviewed today:  Echocardiogram 11/17/2016: - Left ventricle: The cavity size was normal. Wall thickness was   increased in a pattern of moderate LVH. There was mild focal   basal hypertrophy of the septum. Systolic function was normal.   The estimated ejection fraction was in the range of 60% to 65%.   Wall motion was normal; there were no regional wall motion   abnormalities. Features are consistent with a pseudonormal left   ventricular filling pattern, with concomitant abnormal relaxation   and increased filling pressure (grade 2 diastolic dysfunction). - Aortic valve: A bioprosthesis was present. - Mitral valve: Calcified annulus. -  Left atrium: The atrium was severely dilated. - Right ventricle: The cavity size was mildly dilated. - Right atrium: The atrium was mildly dilated. - Pulmonary arteries: Systolic pressure was mildly increased.  Impressions:  - Normal LV systolic function; moderate LVH; moderate diastolic   dysfunction; s/p AVR with normal mean gradient and no AI; severe  LAE; mild RAE and RVE; mild TR with mildly elevated pulmonary   pressure.   EKG: 05/13/2019 shows sinus rhythm 66 PAC nonspecific ST-T wave changes prior sinus bradycardia heart rate 53 bpm incomplete left bundle branch block with repolarization abnormality noted in the lateral leads.  Recent Labs: No results found for requested labs within last 8760 hours.  Recent Lipid Panel No results found for: CHOL, TRIG, HDL, CHOLHDL, VLDL, LDLCALC, LDLDIRECT  Physical Exam:    VS:  BP (!) 160/70   Pulse 66   Ht _0  (1.803 m)   Wt 142 lb 12.8 oz (64.8 kg)   SpO2 99%   BMI 19.92 kg/m     Wt Readings from Last 3 Encounters:  05/13/19 142 lb 12.8 oz (64.8 kg)  05/07/18 143 lb 12.8 oz (65.2 kg)  04/28/17 142 lb 12.8 oz (64.8 kg)     GEN: Thin in no acute distress  HEENT: normal  Neck: no JVD, carotid bruits, or masses Cardiac: RRR; 2/6 systolic murmur, no rubs, or gallops,no edema   Respiratory:  clear to auscultation bilaterally, normal work of breathing GI: soft, nontender, nondistended, + BS MS: no deformity or atrophy  Skin: warm and dry, no rash Neuro:  Alert and Oriented x 3, Strength and sensation are intact Psych: euthymic mood, full affect   ASSESSMENT:    1. Essential hypertension    PLAN:    In order of problems listed above:  Bioprosthetic aortic valve 2015 status post severe aortic stenosis -Overall doing very well.  Dental antibiotics once again reviewed.  Echocardiogram 2018 shows normal function.  Murmur appreciated.  Bradycardia -Seems to be improved after stopping his metoprolol 12.5 mg once a day that had been previously decreased as well by Dr. Dorthy Cooler  Essential hypertension -Medications as above multidrug regimen, low-dose metoprolol had been stopped at prior visit.  Reviewed and managed by PCP.  Medications reviewed.  Hepatocellular carcinoma - Prior ablative therapy 12/07/2017. [facility] -[facility] abdomen [facility] on 2020 showed slight interval decrease in size of segment of ablation cavity.  No new hepatic lesions were noted.  Outside notes from [facility] interventional radiology and outside report of MRI reviewed.  Outside lab work from 01/27/2019 shows hemoglobin of 13.4, ALT of 7, creatinine of 1.0  Diabetes with hypertension -Prior hemoglobin A1c in the 6. 4 range. -On statin, LDL 84 on atorvastatin 20 mg.  Could consider increasing to 40 mg.  ALT 7.   Medication Adjustments/Labs and Tests Ordered: Current medicines are reviewed at length with the patient today.  Concerns regarding medicines are outlined above.  Orders Placed This Encounter  Procedures  . EKG 12-Lead   No orders of the defined types were placed in this encounter.   Patient Instructions  Medication Instructions:  The current medical regimen is effective;  continue present plan and medications.  *If you need a refill on your cardiac medications  before your next appointment, please call your pharmacy*  Follow-Up: At [facility], you and your health needs are our priority.  As part of our continuing mission to provide you with exceptional heart care, we have created designated Provider  Care Teams.  These Care Teams include your primary Cardiologist (physician) and Advanced Practice Providers (APPs -  Physician Assistants and Nurse Practitioners) who all work together to provide you with the care you need, when you need it.  Your next appointment:   12 month(s)  The format for your next appointment:   In Person  Provider:   Candee Furbish, MD  Thank you for choosing [facility]!!          Signed, Candee Furbish, MD  05/13/2019 8:34 AM    [place]

## 2019-05-13 NOTE — Patient Instructions (Signed)
Medication Instructions:  The current medical regimen is effective;  continue present plan and medications.  *If you need a refill on your cardiac medications before your next appointment, please call your pharmacy*  Follow-Up: At [facility], you and your health needs are our priority.  As part of our continuing mission to provide you with exceptional heart care, we have created designated Provider Care Teams.  These Care Teams include your primary Cardiologist (physician) and Advanced Practice Providers (APPs -  Physician Assistants and Nurse Practitioners) who all work together to provide you with the care you need, when you need it.  Your next appointment:   12 month(s)  The format for your next appointment:   In Person  Provider:   Mark Skains, MD   Thank you for choosing [facility]!!     

## 2019-05-27 ENCOUNTER — Encounter: Payer: Self-pay | Admitting: Podiatry

## 2019-05-27 ENCOUNTER — Ambulatory Visit (INDEPENDENT_AMBULATORY_CARE_PROVIDER_SITE_OTHER): Payer: Medicare Other | Admitting: Podiatry

## 2019-05-27 ENCOUNTER — Other Ambulatory Visit: Payer: Self-pay

## 2019-05-27 DIAGNOSIS — M79674 Pain in right toe(s): Secondary | ICD-10-CM

## 2019-05-27 DIAGNOSIS — Q828 Other specified congenital malformations of skin: Secondary | ICD-10-CM

## 2019-05-27 DIAGNOSIS — B351 Tinea unguium: Secondary | ICD-10-CM

## 2019-05-27 DIAGNOSIS — E1142 Type 2 diabetes mellitus with diabetic polyneuropathy: Secondary | ICD-10-CM | POA: Diagnosis not present

## 2019-05-27 DIAGNOSIS — M79675 Pain in left toe(s): Secondary | ICD-10-CM | POA: Diagnosis not present

## 2019-05-27 NOTE — Patient Instructions (Signed)
Diabetes Mellitus and Foot Care Foot care is an important part of your health, especially when you have diabetes. Diabetes may cause you to have problems because of poor blood flow (circulation) to your feet and legs, which can cause your skin to:  Become thinner and drier.  Break more easily.  Heal more slowly.  Peel and crack. You may also have nerve damage (neuropathy) in your legs and feet, causing decreased feeling in them. This means that you may not notice minor injuries to your feet that could lead to more serious problems. Noticing and addressing any potential problems early is the best way to prevent future foot problems. How to care for your feet Foot hygiene  Wash your feet daily with warm water and mild soap. Do not use hot water. Then, pat your feet and the areas between your toes until they are completely dry. Do not soak your feet as this can dry your skin.  Trim your toenails straight across. Do not dig under them or around the cuticle. File the edges of your nails with an emery board or nail file.  Apply a moisturizing lotion or petroleum jelly to the skin on your feet and to dry, brittle toenails. Use lotion that does not contain alcohol and is unscented. Do not apply lotion between your toes. Shoes and socks  Wear clean socks or stockings every day. Make sure they are not too tight. Do not wear knee-high stockings since they may decrease blood flow to your legs.  Wear shoes that fit properly and have enough cushioning. Always look in your shoes before you put them on to be sure there are no objects inside.  To break in new shoes, wear them for just a few hours a day. This prevents injuries on your feet. Wounds, scrapes, corns, and calluses  Check your feet daily for blisters, cuts, bruises, sores, and redness. If you cannot see the bottom of your feet, use a mirror or ask someone for help.  Do not cut corns or calluses or try to remove them with medicine.  If you  find a minor scrape, cut, or break in the skin on your feet, keep it and the skin around it clean and dry. You may clean these areas with mild soap and water. Do not clean the area with peroxide, alcohol, or iodine.  If you have a wound, scrape, corn, or callus on your foot, look at it several times a day to make sure it is healing and not infected. Check for: ? Redness, swelling, or pain. ? Fluid or blood. ? Warmth. ? Pus or a bad smell. General instructions  Do not cross your legs. This may decrease blood flow to your feet.  Do not use heating pads or hot water bottles on your feet. They may burn your skin. If you have lost feeling in your feet or legs, you may not know this is happening until it is too late.  Protect your feet from hot and cold by wearing shoes, such as at the beach or on hot pavement.  Schedule a complete foot exam at least once a year (annually) or more often if you have foot problems. If you have foot problems, report any cuts, sores, or bruises to your health care provider immediately. Contact a health care provider if:  You have a medical condition that increases your risk of infection and you have any cuts, sores, or bruises on your feet.  You have an injury that is not   healing.  You have redness on your legs or feet.  You feel burning or tingling in your legs or feet.  You have pain or cramps in your legs and feet.  Your legs or feet are numb.  Your feet always feel cold.  You have pain around a toenail. Get help right away if:  You have a wound, scrape, corn, or callus on your foot and: ? You have pain, swelling, or redness that gets worse. ? You have fluid or blood coming from the wound, scrape, corn, or callus. ? Your wound, scrape, corn, or callus feels warm to the touch. ? You have pus or a bad smell coming from the wound, scrape, corn, or callus. ? You have a fever. ? You have a red line going up your leg. Summary  Check your feet every day  for cuts, sores, red spots, swelling, and blisters.  Moisturize feet and legs daily.  Wear shoes that fit properly and have enough cushioning.  If you have foot problems, report any cuts, sores, or bruises to your health care provider immediately.  Schedule a complete foot exam at least once a year (annually) or more often if you have foot problems. This information is not intended to replace advice given to you by your health care provider. Make sure you discuss any questions you have with your health care provider. Document Revised: 01/16/2019 Document Reviewed: 05/27/2016 Elsevier Patient Education  2020 [employer]  

## 2019-06-01 NOTE — Progress Notes (Signed)
Subjective: [name] presents today for follow up of preventative diabetic foot care: and painful mycotic nails b/l.  Pain interferes with ambulation. Aggravating factors include wearing enclosed shoe gear. Pt also presents with painful corn(s) left 2nd digit which interferes with ambulation.   He states he did see Dr. Posey Pronto and ulceration of left 2nd digit is healed. He does relate it is a little tender. He's been wearing provided toe pads on occasion.  Allergies  Allergen Reactions  . Crestor [Rosuvastatin]     Discomfort, RESTLESS, CAN'T SLEEP Discomfort, RESTLESS, CAN'T SLEEP Discomfort, RESTLESS, CAN'T SLEEP   . Vytorin [Ezetimibe-Simvastatin]     Leg pains Restlessness at night       Objective: There were no vitals filed for this visit.  Vascular Examination:  capillary fill time to digits <3s b/l, palpable DP pulses b/l, palpable PT pulses b/l, pedal hair absent b/l and skin temperature gradient within normal limits b/l  Dermatological Examination: Pedal skin is thin shiny, atrophic bilaterally., No open wounds bilaterally. and No interdigital macerations bilaterally.   Porokeratotic lesion medial aspect left 2nd digit with tenderness to palpation.  No erythema, no edema, no drainage, no flocculence noted.  onychomycosis of the toenails 1-5 b/l which exhibit, elongation, dystrophy, discoloration, thickening, subungual debris, pain to palpation of nail beds and subungual hematoma  Musculoskeletal: normal muscle strength 5/5 to all lower extremity muscle groups bilaterally, no pain crepitus or joint limitation noted with ROM, bunion deformity noted B and hammertoes noted to the 2nd toe, 3rd toe, 4th toe, 5th toe, bilaterally  Neurological: sensation absent with 10g monofilament  Assessment: 1. Pain due to onychomycosis of toenails of both feet   2. Porokeratosis   3. Diabetic peripheral neuropathy associated with type 2 diabetes mellitus ([place])      Plan: Continue  diabetic foot care principles. Literature dispensed on today.  -Toenails 1-5 b/l were debrided in length and girth without iatrogenic bleeding. -Painful porokeratotic lesions left 2nd digit (interdigitally) pared and enucleated with sterile scalpel blade. Continue toe separators daily for comfort/protection. -Patient to continue soft, supportive shoe gear daily. -Patient to report any pedal injuries to medical professional immediately. -Patient/POA to call should there be question/concern in the interim.  Return in about 9 weeks (around 07/29/2019).

## 2019-06-29 ENCOUNTER — Other Ambulatory Visit: Payer: Self-pay | Admitting: Family

## 2019-06-29 DIAGNOSIS — Z8505 Personal history of malignant neoplasm of liver: Secondary | ICD-10-CM

## 2019-06-29 DIAGNOSIS — Z8619 Personal history of other infectious and parasitic diseases: Secondary | ICD-10-CM

## 2019-06-29 DIAGNOSIS — K7460 Unspecified cirrhosis of liver: Secondary | ICD-10-CM

## 2019-07-22 ENCOUNTER — Ambulatory Visit
Admission: RE | Admit: 2019-07-22 | Discharge: 2019-07-22 | Disposition: A | Discharge status: Home / Self Care | Payer: Medicare Other | Source: Ambulatory Visit | Attending: Family | Admitting: Family

## 2019-07-22 DIAGNOSIS — N281 Cyst of kidney, acquired: Secondary | ICD-10-CM | POA: Diagnosis not present

## 2019-07-22 DIAGNOSIS — Z8505 Personal history of malignant neoplasm of liver: Secondary | ICD-10-CM

## 2019-07-22 DIAGNOSIS — K769 Liver disease, unspecified: Secondary | ICD-10-CM | POA: Diagnosis not present

## 2019-07-22 DIAGNOSIS — K7460 Unspecified cirrhosis of liver: Secondary | ICD-10-CM

## 2019-07-22 DIAGNOSIS — Z8619 Personal history of other infectious and parasitic diseases: Secondary | ICD-10-CM

## 2019-07-22 MED ORDER — GADOBENATE DIMEGLUMINE 529 MG/ML IV SOLN
13.0000 mL | Freq: Once | INTRAVENOUS | Status: AC | PRN
  Administered 2019-07-22: 13 mL via INTRAVENOUS

## 2019-08-01 DIAGNOSIS — H524 Presbyopia: Secondary | ICD-10-CM | POA: Diagnosis not present

## 2019-08-01 DIAGNOSIS — H2513 Age-related nuclear cataract, bilateral: Secondary | ICD-10-CM | POA: Diagnosis not present

## 2019-08-01 DIAGNOSIS — E119 Type 2 diabetes mellitus without complications: Secondary | ICD-10-CM | POA: Diagnosis not present

## 2019-08-01 DIAGNOSIS — H5203 Hypermetropia, bilateral: Secondary | ICD-10-CM | POA: Diagnosis not present

## 2019-08-02 ENCOUNTER — Encounter: Payer: Self-pay | Admitting: Podiatry

## 2019-08-02 ENCOUNTER — Ambulatory Visit (INDEPENDENT_AMBULATORY_CARE_PROVIDER_SITE_OTHER): Payer: Medicare Other | Admitting: Podiatry

## 2019-08-02 ENCOUNTER — Other Ambulatory Visit: Payer: Self-pay

## 2019-08-02 VITALS — Temp 97.2°F

## 2019-08-02 DIAGNOSIS — B351 Tinea unguium: Secondary | ICD-10-CM | POA: Diagnosis not present

## 2019-08-02 DIAGNOSIS — M79675 Pain in left toe(s): Secondary | ICD-10-CM

## 2019-08-02 DIAGNOSIS — E1142 Type 2 diabetes mellitus with diabetic polyneuropathy: Secondary | ICD-10-CM | POA: Diagnosis not present

## 2019-08-02 DIAGNOSIS — L84 Corns and callosities: Secondary | ICD-10-CM

## 2019-08-02 DIAGNOSIS — Q828 Other specified congenital malformations of skin: Secondary | ICD-10-CM | POA: Diagnosis not present

## 2019-08-02 DIAGNOSIS — M79674 Pain in right toe(s): Secondary | ICD-10-CM

## 2019-08-02 NOTE — Patient Instructions (Signed)
Diabetes Mellitus and Foot Care Foot care is an important part of your health, especially when you have diabetes. Diabetes may cause you to have problems because of poor blood flow (circulation) to your feet and legs, which can cause your skin to:  Become thinner and drier.  Break more easily.  Heal more slowly.  Peel and crack. You may also have nerve damage (neuropathy) in your legs and feet, causing decreased feeling in them. This means that you may not notice minor injuries to your feet that could lead to more serious problems. Noticing and addressing any potential problems early is the best way to prevent future foot problems. How to care for your feet Foot hygiene  Wash your feet daily with warm water and mild soap. Do not use hot water. Then, pat your feet and the areas between your toes until they are completely dry. Do not soak your feet as this can dry your skin.  Trim your toenails straight across. Do not dig under them or around the cuticle. File the edges of your nails with an emery board or nail file.  Apply a moisturizing lotion or petroleum jelly to the skin on your feet and to dry, brittle toenails. Use lotion that does not contain alcohol and is unscented. Do not apply lotion between your toes. Shoes and socks  Wear clean socks or stockings every day. Make sure they are not too tight. Do not wear knee-high stockings since they may decrease blood flow to your legs.  Wear shoes that fit properly and have enough cushioning. Always look in your shoes before you put them on to be sure there are no objects inside.  To break in new shoes, wear them for just a few hours a day. This prevents injuries on your feet. Wounds, scrapes, corns, and calluses  Check your feet daily for blisters, cuts, bruises, sores, and redness. If you cannot see the bottom of your feet, use a mirror or ask someone for help.  Do not cut corns or calluses or try to remove them with medicine.  If you  find a minor scrape, cut, or break in the skin on your feet, keep it and the skin around it clean and dry. You may clean these areas with mild soap and water. Do not clean the area with peroxide, alcohol, or iodine.  If you have a wound, scrape, corn, or callus on your foot, look at it several times a day to make sure it is healing and not infected. Check for: ? Redness, swelling, or pain. ? Fluid or blood. ? Warmth. ? Pus or a bad smell. General instructions  Do not cross your legs. This may decrease blood flow to your feet.  Do not use heating pads or hot water bottles on your feet. They may burn your skin. If you have lost feeling in your feet or legs, you may not know this is happening until it is too late.  Protect your feet from hot and cold by wearing shoes, such as at the beach or on hot pavement.  Schedule a complete foot exam at least once a year (annually) or more often if you have foot problems. If you have foot problems, report any cuts, sores, or bruises to your health care provider immediately. Contact a health care provider if:  You have a medical condition that increases your risk of infection and you have any cuts, sores, or bruises on your feet.  You have an injury that is not   healing.  You have redness on your legs or feet.  You feel burning or tingling in your legs or feet.  You have pain or cramps in your legs and feet.  Your legs or feet are numb.  Your feet always feel cold.  You have pain around a toenail. Get help right away if:  You have a wound, scrape, corn, or callus on your foot and: ? You have pain, swelling, or redness that gets worse. ? You have fluid or blood coming from the wound, scrape, corn, or callus. ? Your wound, scrape, corn, or callus feels warm to the touch. ? You have pus or a bad smell coming from the wound, scrape, corn, or callus. ? You have a fever. ? You have a red line going up your leg. Summary  Check your feet every day  for cuts, sores, red spots, swelling, and blisters.  Moisturize feet and legs daily.  Wear shoes that fit properly and have enough cushioning.  If you have foot problems, report any cuts, sores, or bruises to your health care provider immediately.  Schedule a complete foot exam at least once a year (annually) or more often if you have foot problems. This information is not intended to replace advice given to you by your health care provider. Make sure you discuss any questions you have with your health care provider. Document Revised: 01/16/2019 Document Reviewed: 05/27/2016 Elsevier Patient Education  2020 [employer]  

## 2019-08-05 NOTE — Progress Notes (Signed)
Subjective: [name] presents today for follow up of preventative diabetic foot care and corn(s) b/l feet and painful mycotic toenails b/l that are difficult to trim. Pain interferes with ambulation. Aggravating factors include wearing enclosed shoe gear. Pain is relieved with periodic professional debridement.Marland Kitchen  He relates left 2nd toe is tender today as well as his right 4th toe. Denies any redness, swelling or drainage from digits.  Allergies  Allergen Reactions  . Crestor [Rosuvastatin]     Discomfort, RESTLESS, CAN'T SLEEP Discomfort, RESTLESS, CAN'T SLEEP Discomfort, RESTLESS, CAN'T SLEEP   . Vytorin [Ezetimibe-Simvastatin]     Leg pains Restlessness at night       Objective: Vitals:   08/02/19 1032  Temp: (!) 97.2 F (36.2 C)    Pt 79 y.o. year old AA male WD, WN in NAD. AAO x 3.   Vascular Examination:  Capillary refill time to digits immediate b/l. Palpable DP pulses b/l. Palpable PT pulses b/l. Pedal hair absent b/l Skin temperature gradient within normal limits b/l.  Dermatological Examination: Pedal skin is thin shiny, atrophic bilaterally. No open wounds bilaterally. No interdigital macerations bilaterally. Toenails 1-5 b/l elongated, dystrophic, thickened, crumbly with subungual debris and tenderness to dorsal palpation. Hyperkeratotic lesion(s) R 4th toe.  No erythema, no edema, no drainage, no flocculence. Porokeratotic lesion(s) L 2nd toe. No erythema, no edema, no drainage, no flocculence.  Musculoskeletal: Normal muscle strength 5/5 to all lower extremity muscle groups bilaterally, no pain crepitus or joint limitation noted with ROM b/l, bunion deformity noted b/l and hammertoes noted to the  2-5 bilaterally  Neurological: Protective sensation diminished with 10g monofilament b/l.  Assessment: 1. Pain due to onychomycosis of toenails of both feet   2. Porokeratosis   3. Corns   4. Diabetic peripheral neuropathy associated with type 2 diabetes  mellitus ([place])    Plan: -Continue diabetic foot care principles. Literature dispensed on today.  -Toenails 1-5 b/l were debrided in length and girth with sterile nail nippers and dremel without iatrogenic bleeding.  -Corn(s) debrided L 2nd toe and R 4th toe without complication or incident. Total number debrided=2. -Patient to continue soft, supportive shoe gear daily. -Patient to report any pedal injuries to medical professional immediately. -Dispensed Silipos toe cap for daily use for right 4th digit and left 2nd toe. Apply every morning. Remove every evening. -Patient/POA to call should there be question/concern in the interim.  Return in about 3 months (around 11/02/2019).

## 2019-08-12 DIAGNOSIS — C220 Liver cell carcinoma: Secondary | ICD-10-CM | POA: Diagnosis not present

## 2019-08-12 DIAGNOSIS — Z1321 Encounter for screening for nutritional disorder: Secondary | ICD-10-CM | POA: Diagnosis not present

## 2019-08-12 DIAGNOSIS — R109 Unspecified abdominal pain: Secondary | ICD-10-CM | POA: Diagnosis not present

## 2019-08-12 DIAGNOSIS — K7460 Unspecified cirrhosis of liver: Secondary | ICD-10-CM | POA: Diagnosis not present

## 2019-08-19 ENCOUNTER — Other Ambulatory Visit: Payer: Self-pay | Admitting: Family

## 2019-08-19 DIAGNOSIS — K7460 Unspecified cirrhosis of liver: Secondary | ICD-10-CM

## 2019-09-03 DIAGNOSIS — Z23 Encounter for immunization: Secondary | ICD-10-CM | POA: Diagnosis not present

## 2019-09-03 DIAGNOSIS — S0181XA Laceration without foreign body of other part of head, initial encounter: Secondary | ICD-10-CM | POA: Diagnosis not present

## 2019-10-22 ENCOUNTER — Ambulatory Visit
Admission: RE | Admit: 2019-10-22 | Discharge: 2019-10-22 | Disposition: A | Discharge status: Home / Self Care | Payer: Medicare Other | Source: Ambulatory Visit | Attending: Family | Admitting: Family

## 2019-10-22 ENCOUNTER — Other Ambulatory Visit: Payer: Self-pay

## 2019-10-22 DIAGNOSIS — K7460 Unspecified cirrhosis of liver: Secondary | ICD-10-CM

## 2019-10-22 MED ORDER — GADOBENATE DIMEGLUMINE 529 MG/ML IV SOLN
13.0000 mL | Freq: Once | INTRAVENOUS | Status: AC | PRN
  Administered 2019-10-22: 13 mL via INTRAVENOUS

## 2019-11-04 ENCOUNTER — Other Ambulatory Visit: Payer: Self-pay

## 2019-11-04 ENCOUNTER — Encounter: Payer: Self-pay | Admitting: Podiatry

## 2019-11-04 ENCOUNTER — Ambulatory Visit (INDEPENDENT_AMBULATORY_CARE_PROVIDER_SITE_OTHER): Payer: Medicare Other | Admitting: Podiatry

## 2019-11-04 DIAGNOSIS — B351 Tinea unguium: Secondary | ICD-10-CM | POA: Diagnosis not present

## 2019-11-04 DIAGNOSIS — M2042 Other hammer toe(s) (acquired), left foot: Secondary | ICD-10-CM

## 2019-11-04 DIAGNOSIS — E1142 Type 2 diabetes mellitus with diabetic polyneuropathy: Secondary | ICD-10-CM | POA: Diagnosis not present

## 2019-11-04 DIAGNOSIS — M79674 Pain in right toe(s): Secondary | ICD-10-CM

## 2019-11-04 DIAGNOSIS — Q828 Other specified congenital malformations of skin: Secondary | ICD-10-CM

## 2019-11-04 DIAGNOSIS — L84 Corns and callosities: Secondary | ICD-10-CM

## 2019-11-04 DIAGNOSIS — M79675 Pain in left toe(s): Secondary | ICD-10-CM | POA: Diagnosis not present

## 2019-11-04 DIAGNOSIS — Z872 Personal history of diseases of the skin and subcutaneous tissue: Secondary | ICD-10-CM

## 2019-11-08 NOTE — Progress Notes (Signed)
Subjective: [name] presents today at risk foot care with history of diabetic ulcer left 2nd digit which has healed, diabetic neuropathy, painful corn(s) right foot, painful porokeratosis interdigitally left 2nd digit and painful thick toenails that are difficult to trim. Pain interferes with ambulation. Aggravating factors include wearing enclosed shoe gear. Pain is relieved with periodic professional debridement.  [name], [name], MD is patient's PCP.   Past Medical History:  Diagnosis Date  . Acute epididymo-orchitis   . Aortic stenosis    s/p bioprosthetic AVR (Dr. Cyndia Bent) 11/2013  . Arthritis   . Diabetes mellitus without complication ([place])   . ED (erectile dysfunction)   . Essential hypertension, benign   . Heart murmur   . Hep C w/o coma, chronic ([place])   . History of blood transfusion   . History of DVT of lower extremity   . Hx of echocardiogram    Echo (8/15):  Severe LVH, EF 60-65%, no RWMA, Gr 3 DD, AVR ok (mean 21 mmHg), MAC, mild LAE, mild to mod RAE  . Hypercholesterolemia      Patient Active Problem List   Diagnosis Date Noted  . Hepatocellular carcinoma ([place]) 10/19/2017  . Elevated LFTs 08/26/2014  . S/P AVR (aortic valve replacement) 11/22/2013  . Aortic stenosis, severe 11/19/2013  . 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 Aortic stenosis 09/03/2013  . Hyperlipidemia 09/03/2013  . Weight loss 09/03/2013  . Coronary artery disease 09/03/2013  . Hepatitis C 05/29/2013  . Unspecified gastritis and gastroduodenitis without mention of hemorrhage 04/24/2013  . Iron deficiency anemia due to chronic blood loss 04/23/2013  . Hypertension   . Diabetes mellitus without complication ([place])   . Shortness of breath 04/22/2013    Current Outpatient Medications on File Prior to Visit  Medication Sig Dispense Refill  . acetaminophen (TYLENOL) 500 MG tablet Take 500 mg by mouth every 6 (six) hours as needed for mild pain or moderate pain.    Marland Kitchen amLODipine (NORVASC) 10 MG tablet  Take 10 mg by mouth daily.  0  . amoxicillin (AMOXIL) 500 MG capsule TK 4 CS PO 1 HOUR PRIOR TO DENTAL APPOINTMENT    . aspirin EC 81 MG EC tablet Take 1 tablet (81 mg total) by mouth daily.    Marland Kitchen atorvastatin (LIPITOR) 20 MG tablet Take 20 mg by mouth at bedtime.    . ferrous gluconate (FERGON) 324 MG tablet Take 1 tablet (324 mg total) by mouth daily with breakfast. 30 tablet 3  . FLUZONE HIGH-DOSE 0.5 ML injection Inject as directed once.  0  . lisinopril-hydrochlorothiazide (PRINZIDE,ZESTORETIC) 20-25 MG per tablet Take 1 tablet by mouth daily.    . metFORMIN (GLUCOPHAGE) 1000 MG tablet     . metFORMIN (GLUCOPHAGE) 500 MG tablet Take 500 mg by mouth 2 (two) times daily with a meal. Take 567m by mouth twice daily    . mupirocin ointment (BACTROBAN) 2 % APPLY TO LEFT  SECONDTOE ONCE DAILY WITH DRESSING    . SF 5000 PLUS 1.1 % CREA dental cream BRUSH TEETH HS WITH TOOTHPASTE THEN EXPECTORATE  2   No current facility-administered medications on file prior to visit.     Allergies  Allergen Reactions  . Crestor [Rosuvastatin]     Discomfort, RESTLESS, CAN'T SLEEP Discomfort, RESTLESS, CAN'T SLEEP Discomfort, RESTLESS, CAN'T SLEEP   . Vytorin [Ezetimibe-Simvastatin]     Leg pains Restlessness at night      Objective: Daniel ROYSEis a pleasant 79y.o. African American male WD, WN  in NAD. AAO x 3.  There were no vitals filed for this visit.  Vascular Examination: Capillary refill time to digits immediate b/l. Palpable pedal pulses b/l LE. Pedal hair absent. Lower extremity skin temperature gradient within normal limits. No pain with calf compression b/l. No edema noted b/l lower extremities.  Dermatological Examination: Pedal skin is thin shiny, atrophic b/l lower extremities. No open wounds bilaterally. No interdigital macerations bilaterally. Toenails 1-5 b/l elongated, discolored, dystrophic, thickened, crumbly with subungual debris and tenderness to dorsal palpation.  Hyperkeratotic lesion(s) R 4th toe.  No erythema, no edema, no drainage, no flocculence. Porokeratotic lesion(s) L 2nd toe. No erythema, no edema, no drainage, no flocculence.  Musculoskeletal: Normal muscle strength 5/5 to all lower extremity muscle groups bilaterally. No pain crepitus or joint limitation noted with ROM b/l. Hallux valgus with bunion deformity noted b/l lower extremities. Hammertoes noted to the 2-5 bilaterally.  Neurological Examination: Protective sensation diminished with 10g monofilament b/l. Vibratory sensation intact b/l. Proprioception intact bilaterally. Clonus negative b/l. Assessment: 1. Pain due to onychomycosis of toenails of both feet   2. Porokeratosis   3. Corns   4. Hammer toe of left foot   5. History of foot ulcer   6. Diabetic peripheral neuropathy associated with type 2 diabetes mellitus ([place])    Plan: -Examined patient. -No new findings. No new orders. -Continue diabetic foot care principles. -Toenails 1-5 b/l were debrided in length and girth with sterile nail nippers and dremel without iatrogenic bleeding.  -Corn(s) R 4th toe pared utilizing sterile scalpel blade without complication or incident. Total number debrided=1. -Painful porokeratotic lesion(s) L 2nd toe pared and enucleated with sterile scalpel blade without incident. -Patient to report any pedal injuries to medical professional immediately. -Patient to continue soft, supportive shoe gear daily. -Patient/POA to call should there be question/concern in the interim.  Return in about 9 weeks (around 01/06/2020) for nail trim, at risk foot care.  Marzetta Board, DPM

## 2019-12-17 DIAGNOSIS — K7460 Unspecified cirrhosis of liver: Secondary | ICD-10-CM | POA: Diagnosis not present

## 2019-12-17 DIAGNOSIS — Z8505 Personal history of malignant neoplasm of liver: Secondary | ICD-10-CM | POA: Diagnosis not present

## 2019-12-17 DIAGNOSIS — R350 Frequency of micturition: Secondary | ICD-10-CM | POA: Diagnosis not present

## 2019-12-24 DIAGNOSIS — E1169 Type 2 diabetes mellitus with other specified complication: Secondary | ICD-10-CM | POA: Diagnosis not present

## 2019-12-24 DIAGNOSIS — N401 Enlarged prostate with lower urinary tract symptoms: Secondary | ICD-10-CM | POA: Diagnosis not present

## 2019-12-24 DIAGNOSIS — N529 Male erectile dysfunction, unspecified: Secondary | ICD-10-CM | POA: Diagnosis not present

## 2019-12-24 DIAGNOSIS — Z952 Presence of prosthetic heart valve: Secondary | ICD-10-CM | POA: Diagnosis not present

## 2019-12-24 DIAGNOSIS — Z0001 Encounter for general adult medical examination with abnormal findings: Secondary | ICD-10-CM | POA: Diagnosis not present

## 2019-12-24 DIAGNOSIS — I10 Essential (primary) hypertension: Secondary | ICD-10-CM | POA: Diagnosis not present

## 2019-12-24 DIAGNOSIS — N138 Other obstructive and reflux uropathy: Secondary | ICD-10-CM | POA: Diagnosis not present

## 2019-12-24 DIAGNOSIS — E7800 Pure hypercholesterolemia, unspecified: Secondary | ICD-10-CM | POA: Diagnosis not present

## 2019-12-24 DIAGNOSIS — Z79899 Other long term (current) drug therapy: Secondary | ICD-10-CM | POA: Diagnosis not present

## 2020-01-20 ENCOUNTER — Ambulatory Visit (INDEPENDENT_AMBULATORY_CARE_PROVIDER_SITE_OTHER): Payer: Medicare Other | Admitting: Podiatry

## 2020-01-20 ENCOUNTER — Other Ambulatory Visit: Payer: Self-pay

## 2020-01-20 ENCOUNTER — Encounter: Payer: Self-pay | Admitting: Podiatry

## 2020-01-20 DIAGNOSIS — B351 Tinea unguium: Secondary | ICD-10-CM | POA: Diagnosis not present

## 2020-01-20 DIAGNOSIS — L84 Corns and callosities: Secondary | ICD-10-CM | POA: Diagnosis not present

## 2020-01-20 DIAGNOSIS — Q828 Other specified congenital malformations of skin: Secondary | ICD-10-CM | POA: Diagnosis not present

## 2020-01-20 DIAGNOSIS — E1149 Type 2 diabetes mellitus with other diabetic neurological complication: Secondary | ICD-10-CM | POA: Diagnosis not present

## 2020-01-20 DIAGNOSIS — M79675 Pain in left toe(s): Secondary | ICD-10-CM | POA: Diagnosis not present

## 2020-01-20 DIAGNOSIS — E1140 Type 2 diabetes mellitus with diabetic neuropathy, unspecified: Secondary | ICD-10-CM | POA: Diagnosis not present

## 2020-01-20 DIAGNOSIS — M79674 Pain in right toe(s): Secondary | ICD-10-CM | POA: Diagnosis not present

## 2020-01-20 NOTE — Progress Notes (Signed)
Subjective: [name] presents today at risk foot care with history of diabetic ulcer left 2nd digit which has healed, diabetic neuropathy, painful corn(s) right foot, painful porokeratosis interdigitally left 2nd digit and painful thick toenails that are difficult to trim. Pain interferes with ambulation. Aggravating factors include wearing enclosed shoe gear. Pain is relieved with periodic professional debridement.  [name], [name], MD is patient's PCP.   Past Medical History:  Diagnosis Date  . Acute epididymo-orchitis   . Aortic stenosis    s/p bioprosthetic AVR (Dr. Cyndia Bent) 11/2013  . Arthritis   . Diabetes mellitus without complication ([place])   . ED (erectile dysfunction)   . Essential hypertension, benign   . Heart murmur   . Hep C w/o coma, chronic ([place])   . History of blood transfusion   . History of DVT of lower extremity   . Hx of echocardiogram    Echo (8/15):  Severe LVH, EF 60-65%, no RWMA, Gr 3 DD, AVR ok (mean 21 mmHg), MAC, mild LAE, mild to mod RAE  . Hypercholesterolemia      Patient Active Problem List   Diagnosis Date Noted  . Hepatocellular carcinoma ([place]) 10/19/2017  . Elevated LFTs 08/26/2014  . S/P AVR (aortic valve replacement) 11/22/2013  . Aortic stenosis, severe 11/19/2013  . 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 Aortic stenosis 09/03/2013  . Hyperlipidemia 09/03/2013  . Weight loss 09/03/2013  . Coronary artery disease 09/03/2013  . Hepatitis C 05/29/2013  . Unspecified gastritis and gastroduodenitis without mention of hemorrhage 04/24/2013  . Iron deficiency anemia due to chronic blood loss 04/23/2013  . Hypertension   . Diabetes mellitus without complication ([place])   . Shortness of breath 04/22/2013    Current Outpatient Medications on File Prior to Visit  Medication Sig Dispense Refill  . acetaminophen (TYLENOL) 500 MG tablet Take 500 mg by mouth every 6 (six) hours as needed for mild pain or moderate pain.    Marland Kitchen amLODipine (NORVASC) 10 MG tablet  Take 10 mg by mouth daily.  0  . amoxicillin (AMOXIL) 500 MG capsule TK 4 CS PO 1 HOUR PRIOR TO DENTAL APPOINTMENT    . aspirin EC 81 MG EC tablet Take 1 tablet (81 mg total) by mouth daily.    Marland Kitchen atorvastatin (LIPITOR) 20 MG tablet Take 20 mg by mouth at bedtime.    . ferrous gluconate (FERGON) 324 MG tablet Take 1 tablet (324 mg total) by mouth daily with breakfast. 30 tablet 3  . FLUZONE HIGH-DOSE 0.5 ML injection Inject as directed once.  0  . lisinopril-hydrochlorothiazide (PRINZIDE,ZESTORETIC) 20-25 MG per tablet Take 1 tablet by mouth daily.    . metFORMIN (GLUCOPHAGE) 1000 MG tablet     . metFORMIN (GLUCOPHAGE) 500 MG tablet Take 500 mg by mouth 2 (two) times daily with a meal. Take 550m by mouth twice daily    . mupirocin ointment (BACTROBAN) 2 % APPLY TO LEFT  SECONDTOE ONCE DAILY WITH DRESSING    . SF 5000 PLUS 1.1 % CREA dental cream BRUSH TEETH HS WITH TOOTHPASTE THEN EXPECTORATE  2  . tamsulosin (FLOMAX) 0.4 MG CAPS capsule Take 0.4 mg by mouth as needed.     No current facility-administered medications on file prior to visit.     Allergies  Allergen Reactions  . Crestor [Rosuvastatin]     Discomfort, RESTLESS, CAN'T SLEEP Discomfort, RESTLESS, CAN'T SLEEP Discomfort, RESTLESS, CAN'T SLEEP   . Vytorin [Ezetimibe-Simvastatin]     Leg pains Restlessness at night  Objective: [name] is a pleasant 79 y.o. African American male WD, WN in NAD. AAO x 3.  There were no vitals filed for this visit.  Vascular Examination: Capillary refill time to digits immediate b/l. Palpable pedal pulses b/l LE. Pedal hair absent. Lower extremity skin temperature gradient within normal limits. No pain with calf compression b/l. No edema noted b/l lower extremities.  Dermatological Examination: Pedal skin is thin shiny, atrophic b/l lower extremities. No open wounds bilaterally. No interdigital macerations bilaterally. Toenails 1-5 b/l elongated, discolored, dystrophic,  thickened, crumbly with subungual debris and tenderness to dorsal palpation. Hyperkeratotic lesion(s) R 4th toe.  No erythema, no edema, no drainage, no flocculence. Porokeratotic lesion(s) L 2nd toe, submet head 2 left foot and submet head 3 left foot. No erythema, no edema, no drainage, no flocculence.  Musculoskeletal: Normal muscle strength 5/5 to all lower extremity muscle groups bilaterally. No pain crepitus or joint limitation noted with ROM b/l. Hallux valgus with bunion deformity noted b/l lower extremities. Hammertoes noted to the 2-5 bilaterally.  Neurological Examination: Protective sensation diminished with 10g monofilament b/l. Vibratory sensation intact b/l. Proprioception intact bilaterally. Clonus negative b/l.   Assessment: 1. Pain due to onychomycosis of toenails of both feet   2. Porokeratosis   3. Corns   4. Diabetic neuropathy with neurologic complication ([place])     Plan: -Examined patient. -No new findings. No new orders. -Continue diabetic foot care principles. -Toenails 1-5 b/l were debrided in length and girth with sterile nail nippers and dremel without iatrogenic bleeding.  -Corn(s) R 4th toe pared utilizing sterile scalpel blade without complication or incident. Total number debrided=1. -Painful porokeratotic lesion(s) submet head 2 left foot, submet head 3 left foot and enucleated with sterile scalpel blade. Iatrogenic laceration sustained during submetatarsal head 2nd left foot. Treated with Lumicain Hemostatic Solution and alcohol. Light dressing applied. Patient instructed to apply Neosporin to submet head 2nd left foot once daily for one week. Call office if he has any problems. -Patient to report any pedal injuries to medical professional immediately. -Patient to continue soft, supportive shoe gear daily. -Patient/POA to call should there be question/concern in the interim.  Return in about 9 weeks (around 03/23/2020).  Marzetta Board, DPM

## 2020-01-22 DIAGNOSIS — Z23 Encounter for immunization: Secondary | ICD-10-CM | POA: Diagnosis not present

## 2020-01-22 DIAGNOSIS — L089 Local infection of the skin and subcutaneous tissue, unspecified: Secondary | ICD-10-CM | POA: Diagnosis not present

## 2020-01-22 DIAGNOSIS — N401 Enlarged prostate with lower urinary tract symptoms: Secondary | ICD-10-CM | POA: Diagnosis not present

## 2020-01-22 DIAGNOSIS — L723 Sebaceous cyst: Secondary | ICD-10-CM | POA: Diagnosis not present

## 2020-03-17 DIAGNOSIS — Z8505 Personal history of malignant neoplasm of liver: Secondary | ICD-10-CM | POA: Diagnosis not present

## 2020-03-17 DIAGNOSIS — K7460 Unspecified cirrhosis of liver: Secondary | ICD-10-CM | POA: Diagnosis not present

## 2020-03-17 DIAGNOSIS — K769 Liver disease, unspecified: Secondary | ICD-10-CM | POA: Diagnosis not present

## 2020-04-15 ENCOUNTER — Other Ambulatory Visit: Payer: Self-pay

## 2020-04-15 ENCOUNTER — Ambulatory Visit (INDEPENDENT_AMBULATORY_CARE_PROVIDER_SITE_OTHER): Payer: Medicare Other | Admitting: Podiatry

## 2020-04-15 ENCOUNTER — Encounter: Payer: Self-pay | Admitting: Podiatry

## 2020-04-15 DIAGNOSIS — M79674 Pain in right toe(s): Secondary | ICD-10-CM | POA: Diagnosis not present

## 2020-04-15 DIAGNOSIS — E1140 Type 2 diabetes mellitus with diabetic neuropathy, unspecified: Secondary | ICD-10-CM

## 2020-04-15 DIAGNOSIS — M2041 Other hammer toe(s) (acquired), right foot: Secondary | ICD-10-CM

## 2020-04-15 DIAGNOSIS — E1149 Type 2 diabetes mellitus with other diabetic neurological complication: Secondary | ICD-10-CM

## 2020-04-15 DIAGNOSIS — E119 Type 2 diabetes mellitus without complications: Secondary | ICD-10-CM | POA: Diagnosis not present

## 2020-04-15 DIAGNOSIS — B351 Tinea unguium: Secondary | ICD-10-CM | POA: Diagnosis not present

## 2020-04-15 DIAGNOSIS — L84 Corns and callosities: Secondary | ICD-10-CM | POA: Diagnosis not present

## 2020-04-15 DIAGNOSIS — M79675 Pain in left toe(s): Secondary | ICD-10-CM

## 2020-04-15 DIAGNOSIS — M2042 Other hammer toe(s) (acquired), left foot: Secondary | ICD-10-CM

## 2020-04-15 DIAGNOSIS — Q828 Other specified congenital malformations of skin: Secondary | ICD-10-CM | POA: Diagnosis not present

## 2020-04-19 NOTE — Progress Notes (Signed)
ANNUAL DIABETIC FOOT EXAM  Subjective: [name] presents today for for annual diabetic foot examination, at risk foot care with history of diabetic neuropathy, corn(s) b/l feet , callus(es) right foot and painful mycotic nails.  Pain interferes with ambulation. Aggravating factors include wearing enclosed shoe gear. Painful toenails interfere with ambulation. Aggravating factors include wearing enclosed shoe gear. Pain is relieved with periodic professional debridement. Painful corns and calluses are aggravated when weightbearing with and without shoegear. Pain is relieved with periodic professional debridement. and painful porokeratotic lesions b/l.  Pain prevent comfortable ambulation. Aggravating factor is weightbearing with or without shoegear.  Patient relates several year h/o diabetes.  Patient relates h/o foot wound left 2nd toe which healed uneventfully.  Patient denies symptoms of foot numbness.  Patient denies symptoms of foot tingling.  Patient denies symptoms of burning in feet.  Patient's blood sugar was 98 mg/dl yesterday.  Patient did not check blood glucose this morning.  [name], [name], MD is patient's PCP. Last visit was  11/15/2018.  Past Medical History:  Diagnosis Date  . Acute epididymo-orchitis   . Aortic stenosis    s/p bioprosthetic AVR (Dr. Cyndia Bent) 11/2013  . Arthritis   . Diabetes mellitus without complication ([place])   . ED (erectile dysfunction)   . Essential hypertension, benign   . Heart murmur   . Hep C w/o coma, chronic ([place])   . History of blood transfusion   . History of DVT of lower extremity   . Hx of echocardiogram    Echo (8/15):  Severe LVH, EF 60-65%, no RWMA, Gr 3 DD, AVR ok (mean 21 mmHg), MAC, mild LAE, mild to mod RAE  . Hypercholesterolemia    Patient Active Problem List   Diagnosis Date Noted  . Hepatocellular carcinoma ([place]) 10/19/2017  . Elevated LFTs 08/26/2014  . S/P AVR (aortic valve replacement) 11/22/2013  . Aortic  stenosis, severe 11/19/2013  . 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 Aortic stenosis 09/03/2013  . Hyperlipidemia 09/03/2013  . Weight loss 09/03/2013  . Coronary artery disease 09/03/2013  . Hepatitis C 05/29/2013  . Unspecified gastritis and gastroduodenitis without mention of hemorrhage 04/24/2013  . Iron deficiency anemia due to chronic blood loss 04/23/2013  . Hypertension   . Diabetes mellitus without complication ([place])   . Shortness of breath 04/22/2013   Past Surgical History:  Procedure Laterality Date  . AORTIC VALVE REPLACEMENT N/A 11/19/2013   Procedure: AORTIC VALVE REPLACEMENT (AVR);  Surgeon: Gaye Pollack, MD;  Location: [place];  Service: Open Heart Surgery;  Laterality: N/A;  . BUNIONECTOMY Bilateral 2008  . CARDIAC CATHETERIZATION  10/01/13  . COLONOSCOPY  01/2010   internal hemorrhoids.  Dr Deatra Ina  . ESOPHAGOGASTRODUODENOSCOPY N/A 04/24/2013   Procedure: ESOPHAGOGASTRODUODENOSCOPY (EGD);  Surgeon: Jerene Bears, MD;  [facility] ENDOSCOPY;  Service: Endoscopy;  Laterality: N/A;  . INGUINAL HERNIA REPAIR Right   . INTRAOPERATIVE TRANSESOPHAGEAL ECHOCARDIOGRAM N/A 11/19/2013   Procedure: INTRAOPERATIVE TRANSESOPHAGEAL ECHOCARDIOGRAM;  Surgeon: Gaye Pollack, MD;  Location: [facility] OR;  Service: Open Heart Surgery;  Laterality: N/A;  . KNEE ARTHROSCOPY Left 05/2009   Dr Collier Salina  . LAMINECTOMY  06/2005   Dr Joya Salm. multiple lumbar level laminectomies.   Marland Kitchen LEFT AND RIGHT HEART CATHETERIZATION WITH CORONARY ANGIOGRAM N/A 10/01/2013   Procedure: LEFT AND RIGHT HEART CATHETERIZATION WITH CORONARY ANGIOGRAM;  Surgeon: Candee Furbish, MD;  Location: [facility] CATH LAB;  Service: Cardiovascular;  Laterality: N/A;  . TEE WITHOUT CARDIOVERSION  11/2003   Aoritic valve sclerosis.   Marland Kitchen  TONSILLECTOMY     Current Outpatient Medications on File Prior to Visit  Medication Sig Dispense Refill  . acetaminophen (TYLENOL) 500 MG tablet Take 500 mg by mouth every 6 (six) hours as needed for mild pain or  moderate pain.    Marland Kitchen amLODipine (NORVASC) 10 MG tablet Take 10 mg by mouth daily.  0  . amoxicillin (AMOXIL) 500 MG capsule TK 4 CS PO 1 HOUR PRIOR TO DENTAL APPOINTMENT    . aspirin EC 81 MG EC tablet Take 1 tablet (81 mg total) by mouth daily.    Marland Kitchen atorvastatin (LIPITOR) 20 MG tablet Take 20 mg by mouth at bedtime.    . cephALEXin (KEFLEX) 500 MG capsule     . ferrous gluconate (FERGON) 324 MG tablet Take 1 tablet (324 mg total) by mouth daily with breakfast. 30 tablet 3  . FLUZONE HIGH-DOSE 0.5 ML injection Inject as directed once.  0  . lisinopril-hydrochlorothiazide (PRINZIDE,ZESTORETIC) 20-25 MG per tablet Take 1 tablet by mouth daily.    . metFORMIN (GLUCOPHAGE) 1000 MG tablet     . metFORMIN (GLUCOPHAGE) 500 MG tablet Take 500 mg by mouth 2 (two) times daily with a meal. Take 535m by mouth twice daily    . mupirocin ointment (BACTROBAN) 2 % APPLY TO LEFT  SECONDTOE ONCE DAILY WITH DRESSING    . SF 5000 PLUS 1.1 % CREA dental cream BRUSH TEETH HS WITH TOOTHPASTE THEN EXPECTORATE  2  . tamsulosin (FLOMAX) 0.4 MG CAPS capsule Take 0.4 mg by mouth as needed.     No current facility-administered medications on file prior to visit.    Allergies  Allergen Reactions  . Crestor [Rosuvastatin]     Discomfort, RESTLESS, CAN'T SLEEP Discomfort, RESTLESS, CAN'T SLEEP Discomfort, RESTLESS, CAN'T SLEEP   . Metoprolol     Other reaction(s): bradycardia  . Vytorin [Ezetimibe-Simvastatin]     Leg pains Restlessness at night     Social History   Occupational History  . Not on file  Tobacco Use  . Smoking status: Former Smoker    Types: Cigarettes    Quit date: 09/26/1986    Years since quitting: 33.5  . Smokeless tobacco: Never Used  Vaping Use  . Vaping Use: Never used  Substance and Sexual Activity  . Alcohol use: No  . Drug use: No  . Sexual activity: Not on file   Family History  Problem Relation Age of Onset  . Diabetes Father   . Stroke Father   . Diabetes Mother   .  Stroke Mother   . Hypertension Unknown        family history  . Heart attack Neg Hx    Immunization History  Administered Date(s) Administered  . Influenza, High Dose Seasonal PF 02/03/2017, 02/06/2018, 02/05/2019     Review of Systems: Negative except as noted in the HPI.  Objective: There were no vitals filed for this visit.  Daniel BLUBAUGHis a pleasant 79y.o. African American male WD, WN in NAD. AAO X 3.  Vascular Examination: Capillary refill time to digits immediate b/l. Palpable pedal pulses b/l LE. Pedal hair absent. Lower extremity skin temperature gradient within normal limits.  Dermatological Examination: Pedal skin with normal turgor, texture and tone bilaterally. No open wounds bilaterally. No interdigital macerations bilaterally. Toenails 1-5 b/l elongated, discolored, dystrophic, thickened, crumbly with subungual debris and tenderness to dorsal palpation. Hyperkeratotic lesion(s) R 4th toe and 1st metatarsal head right foot.  No erythema, no edema, no drainage, no  fluctuance. Porokeratotic lesion(s) submet head 2 left foot, submet head 2 right foot and submet head 3 right foot. No erythema, no edema, no drainage, no fluctuance.   Interdigital corn left 2nd toe has resolved.  Musculoskeletal Examination: Normal muscle strength 5/5 to all lower extremity muscle groups bilaterally. No pain crepitus or joint limitation noted with ROM b/l. Hallux valgus with bunion deformity noted b/l lower extremities. Hammertoes noted to the 2-5 bilaterally.  Footwear Assessment: Does the patient wear appropriate shoes? Yes. Does the patient need inserts/orthotics? Yes.  Neurological Examination: Protective sensation diminished with 10g monofilament b/l. Vibratory sensation intact b/l. Clonus negative b/l.  No flowsheet data found.   Assessment: 1. Pain due to onychomycosis of toenails of both feet   2. Corns and callosities   3. Porokeratosis   4. Acquired hammertoes of both  feet   5. Diabetic neuropathy with neurologic complication ([place])   6. Encounter for diabetic foot exam ([place])      ADA Risk Categorization: High Risk  Patient has one or more of the following: Loss of protective sensation Absent pedal pulses Severe Foot deformity History of foot ulcer  Plan: -Examined patient. -Diabetic foot examination performed on today's visit. -Continue diabetic foot care principles. -Patient to continue soft, supportive shoe gear daily. -Toenails 1-5 b/l were debrided in length and girth with sterile nail nippers and dremel without iatrogenic bleeding.  -Corn(s) R 4th toe and callus(es) 1st metatarsal head right foot were pared utilizing sterile scalpel blade without incident. Total number debrided =2. -Painful porokeratotic lesion(s) submet head 2 left foot, submet head 2 right foot and submet head 3 right foot pared and enucleated with sterile scalpel blade without incident. -Patient to report any pedal injuries to medical professional immediately. -Patient/POA to call should there be question/concern in the interim.  Return in about 3 months (around 07/14/2020) for toenail debridement w/corn(s)/callus(es).  Marzetta Board, DPM

## 2020-05-12 ENCOUNTER — Ambulatory Visit (INDEPENDENT_AMBULATORY_CARE_PROVIDER_SITE_OTHER): Payer: Medicare Other | Admitting: Orthopaedic Surgery

## 2020-05-12 ENCOUNTER — Encounter: Payer: Self-pay | Admitting: Orthopaedic Surgery

## 2020-05-12 ENCOUNTER — Ambulatory Visit (INDEPENDENT_AMBULATORY_CARE_PROVIDER_SITE_OTHER): Payer: Medicare Other

## 2020-05-12 VITALS — BP 143/82 | HR 83 | Ht 71.0 in | Wt 143.0 lb

## 2020-05-12 DIAGNOSIS — M545 Low back pain, unspecified: Secondary | ICD-10-CM | POA: Diagnosis not present

## 2020-05-14 ENCOUNTER — Encounter: Payer: Self-pay | Admitting: Cardiology

## 2020-05-14 ENCOUNTER — Ambulatory Visit (INDEPENDENT_AMBULATORY_CARE_PROVIDER_SITE_OTHER): Payer: Medicare Other | Admitting: Cardiology

## 2020-05-14 ENCOUNTER — Other Ambulatory Visit: Payer: Self-pay

## 2020-05-14 VITALS — BP 140/80 | HR 66 | Ht 71.0 in | Wt 138.4 lb

## 2020-05-14 DIAGNOSIS — I350 Nonrheumatic aortic (valve) stenosis: Secondary | ICD-10-CM

## 2020-05-14 DIAGNOSIS — I10 Essential (primary) hypertension: Secondary | ICD-10-CM

## 2020-05-14 DIAGNOSIS — Z952 Presence of prosthetic heart valve: Secondary | ICD-10-CM | POA: Diagnosis not present

## 2020-05-14 NOTE — Patient Instructions (Signed)
Medication Instructions:  The current medical regimen is effective;  continue present plan and medications.  *If you need a refill on your cardiac medications before your next appointment, please call your pharmacy*  Testing/Procedures: Your physician has requested that you have an echocardiogram. Echocardiography is a painless test that uses sound waves to create images of your heart. It provides your doctor with information about the size and shape of your heart and how well your heart's chambers and valves are working. This procedure takes approximately one hour. There are no restrictions for this procedure.  Follow-Up: At [facility], you and your health needs are our priority.  As part of our continuing mission to provide you with exceptional heart care, we have created designated Provider Care Teams.  These Care Teams include your primary Cardiologist (physician) and Advanced Practice Providers (APPs -  Physician Assistants and Nurse Practitioners) who all work together to provide you with the care you need, when you need it.  We recommend signing up for the patient portal called "MyChart".  Sign up information is provided on this After Visit Summary.  MyChart is used to connect with patients for Virtual Visits (Telemedicine).  Patients are able to view lab/test results, encounter notes, upcoming appointments, etc.  Non-urgent messages can be sent to your provider as well.   To learn more about what you can do with MyChart, go to [email or web address].    Your next appointment:   12 month(s)  The format for your next appointment:   In Person  Provider:   Candee Furbish, MD   Thank you for choosing [facility]!!

## 2020-05-14 NOTE — Progress Notes (Signed)
Office Visit Note   Patient: [name]           Date of Birth: 08/10/1940           MRN: [id number] Visit Date: 05/12/2020              Requested by: Lujean Amel, MD [place],  [place] [id number] PCP: Lujean Amel, MD   Assessment & Plan: Visit Diagnoses:  1. Midline low back pain, unspecified chronicity, unspecified whether sciatica present     Plan: Patient has nonpainful fluid-filled sac subcutaneous right upper lumbar region nonpainful.  Does not bother him its been present for years and he does not think it is enlarged.  No radiculopathy or myelopathy symptoms.  He can follow-up if he develops either enlargement of the mass, pain of the mass or radicular myelopathic or claudication symptoms.  Follow-Up Instructions: No follow-ups on file.   Orders:  Orders Placed This Encounter  Procedures  . XR Lumbar Spine 2-3 Views   No orders of the defined types were placed in this encounter.     Procedures: No procedures performed   Clinical Data: No additional findings.   Subjective: Chief Complaint  Patient presents with  . Lower Back - Pain    HPI 80 year old male here for evaluation when a CT scan showed a growth on his back consistent with a fluid-filled sac.  Patient has cirrhosis of the liver and was getting a CT scan.  Patient denies back pain no leg numbness or tingling.  No claudication symptoms no falling history.  Review of Systems has a history of hepatitis C that was treated.  Past cellular carcinoma, hypertension.  He had aortic valve replacement without current chest pain.  All other systems noncontributory.  He does have diabetes.   Objective: Vital Signs: BP (!) 143/82   Pulse 83   Ht _0  (1.803 m)   Wt 143 lb (64.9 kg)   BMI 19.94 kg/m   Physical Exam Constitutional:      Appearance: He is well-developed and well-nourished.  HENT:     Head: Normocephalic and atraumatic.  Eyes:     Extraocular  Movements: EOM normal.     Pupils: Pupils are equal, round, and reactive to light.  Neck:     Thyroid: No thyromegaly.     Trachea: No tracheal deviation.  Cardiovascular:     Rate and Rhythm: Normal rate.  Pulmonary:     Effort: Pulmonary effort is normal.     Breath sounds: No wheezing.  Abdominal:     General: Bowel sounds are normal.     Palpations: Abdomen is soft.  Skin:    General: Skin is warm and dry.     Capillary Refill: Capillary refill takes less than 2 seconds.  Neurological:     Mental Status: He is alert and oriented to person, place, and time.  Psychiatric:        Mood and Affect: Mood and affect normal.        Behavior: Behavior normal.        Thought Content: Thought content normal.        Judgment: Judgment normal.     Ortho Exam patient has cyst upper lumbar region subcutaneous apparent fluid-filled.  No cellulitis no tenderness.  Knee and ankle jerk intact negative straight leg raising 90 degrees. Specialty Comments:  No specialty comments available.  Imaging: No results found.   PMFS History: Patient Active Problem List  Diagnosis Date Noted  . Hepatocellular carcinoma ([place]) 10/19/2017  . Elevated LFTs 08/26/2014  . S/P AVR (aortic valve replacement) 11/22/2013  . Aortic stenosis, severe 11/19/2013  . 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 Aortic stenosis 09/03/2013  . Hyperlipidemia 09/03/2013  . Weight loss 09/03/2013  . Coronary artery disease 09/03/2013  . Hepatitis C 05/29/2013  . Unspecified gastritis and gastroduodenitis without mention of hemorrhage 04/24/2013  . Iron deficiency anemia due to chronic blood loss 04/23/2013  . Hypertension   . Diabetes mellitus without complication ([place])   . Shortness of breath 04/22/2013   Past Medical History:  Diagnosis Date  . Acute epididymo-orchitis   . Aortic stenosis    s/p bioprosthetic AVR (Dr. Cyndia Bent) 11/2013  . Arthritis   . Diabetes mellitus without complication ([place])   . ED (erectile  dysfunction)   . Essential hypertension, benign   . Heart murmur   . Hep C w/o coma, chronic ([place])   . History of blood transfusion   . History of DVT of lower extremity   . Hx of echocardiogram    Echo (8/15):  Severe LVH, EF 60-65%, no RWMA, Gr 3 DD, AVR ok (mean 21 mmHg), MAC, mild LAE, mild to mod RAE  . Hypercholesterolemia     Family History  Problem Relation Age of Onset  . Diabetes Father   . Stroke Father   . Diabetes Mother   . Stroke Mother   . Hypertension Unknown        family history  . Heart attack Neg Hx     Past Surgical History:  Procedure Laterality Date  . AORTIC VALVE REPLACEMENT N/A 11/19/2013   Procedure: AORTIC VALVE REPLACEMENT (AVR);  Surgeon: Gaye Pollack, MD;  Location: [place];  Service: Open Heart Surgery;  Laterality: N/A;  . BUNIONECTOMY Bilateral 2008  . CARDIAC CATHETERIZATION  10/01/13  . COLONOSCOPY  01/2010   internal hemorrhoids.  Dr Deatra Ina  . ESOPHAGOGASTRODUODENOSCOPY N/A 04/24/2013   Procedure: ESOPHAGOGASTRODUODENOSCOPY (EGD);  Surgeon: Jerene Bears, MD;  Location: [facility] ENDOSCOPY;  Service: Endoscopy;  Laterality: N/A;  . INGUINAL HERNIA REPAIR Right   . INTRAOPERATIVE TRANSESOPHAGEAL ECHOCARDIOGRAM N/A 11/19/2013   Procedure: INTRAOPERATIVE TRANSESOPHAGEAL ECHOCARDIOGRAM;  Surgeon: Gaye Pollack, MD;  Location: [facility] OR;  Service: Open Heart Surgery;  Laterality: N/A;  . KNEE ARTHROSCOPY Left 05/2009   Dr Collier Salina  . LAMINECTOMY  06/2005   Dr Joya Salm. multiple lumbar level laminectomies.   Marland Kitchen LEFT AND RIGHT HEART CATHETERIZATION WITH CORONARY ANGIOGRAM N/A 10/01/2013   Procedure: LEFT AND RIGHT HEART CATHETERIZATION WITH CORONARY ANGIOGRAM;  Surgeon: Candee Furbish, MD;  Location: [facility] CATH LAB;  Service: Cardiovascular;  Laterality: N/A;  . TEE WITHOUT CARDIOVERSION  11/2003   Aoritic valve sclerosis.   . TONSILLECTOMY     Social History   Occupational History  . Not on file  Tobacco Use  . Smoking status: Former Smoker    Types: Cigarettes     Quit date: 09/26/1986    Years since quitting: 33.6  . Smokeless tobacco: Never Used  Vaping Use  . Vaping Use: Never used  Substance and Sexual Activity  . Alcohol use: No  . Drug use: No  . Sexual activity: Not on file

## 2020-05-14 NOTE — Progress Notes (Signed)
Cardiology Office Note:    Date:  05/14/2020   ID:  [name], DOB 26-Nov-1940, MRN [id number]  PCP:  Lujean Amel, MD  [facility] Cardiologist:  Candee Furbish, MD  [facility] Electrophysiologist:  None   Referring MD: Lujean Amel, MD     History of Present Illness:    [name] is a 80 y.o. male here for follow-up of bioprosthetic aortic valve from 2015 with comorbidity of hepatocellular carcinoma.  Previously treated hepatitis C.  Also has bilateral carotid artery disease mild.  He is thin, difficulty maintaining his weight.  Previously we had stopped his low-dose metoprolol.  Has disequilibrium at times for instance when looking up quickly.  Overall has been doing quite well.  Denies any chest pain syncope fevers chills nausea vomiting.  Past Medical History:  Diagnosis Date  . Acute epididymo-orchitis   . Aortic stenosis    s/p bioprosthetic AVR (Dr. Cyndia Bent) 11/2013  . Arthritis   . Diabetes mellitus without complication ([place])   . ED (erectile dysfunction)   . Essential hypertension, benign   . Heart murmur   . Hep C w/o coma, chronic ([place])   . History of blood transfusion   . History of DVT of lower extremity   . Hx of echocardiogram    Echo (8/15):  Severe LVH, EF 60-65%, no RWMA, Gr 3 DD, AVR ok (mean 21 mmHg), MAC, mild LAE, mild to mod RAE  . Hypercholesterolemia     Past Surgical History:  Procedure Laterality Date  . AORTIC VALVE REPLACEMENT N/A 11/19/2013   Procedure: AORTIC VALVE REPLACEMENT (AVR);  Surgeon: Gaye Pollack, MD;  Location: [place];  Service: Open Heart Surgery;  Laterality: N/A;  . BUNIONECTOMY Bilateral 2008  . CARDIAC CATHETERIZATION  10/01/13  . COLONOSCOPY  01/2010   internal hemorrhoids.  Dr Deatra Ina  . ESOPHAGOGASTRODUODENOSCOPY N/A 04/24/2013   Procedure: ESOPHAGOGASTRODUODENOSCOPY (EGD);  Surgeon: Jerene Bears, MD;  Location: [facility] ENDOSCOPY;  Service: Endoscopy;  Laterality: N/A;  . INGUINAL HERNIA REPAIR Right   .  INTRAOPERATIVE TRANSESOPHAGEAL ECHOCARDIOGRAM N/A 11/19/2013   Procedure: INTRAOPERATIVE TRANSESOPHAGEAL ECHOCARDIOGRAM;  Surgeon: Gaye Pollack, MD;  Location: [facility] OR;  Service: Open Heart Surgery;  Laterality: N/A;  . KNEE ARTHROSCOPY Left 05/2009   Dr Collier Salina  . LAMINECTOMY  06/2005   Dr Joya Salm. multiple lumbar level laminectomies.   Marland Kitchen LEFT AND RIGHT HEART CATHETERIZATION WITH CORONARY ANGIOGRAM N/A 10/01/2013   Procedure: LEFT AND RIGHT HEART CATHETERIZATION WITH CORONARY ANGIOGRAM;  Surgeon: Candee Furbish, MD;  Location: [facility] CATH LAB;  Service: Cardiovascular;  Laterality: N/A;  . TEE WITHOUT CARDIOVERSION  11/2003   Aoritic valve sclerosis.   . TONSILLECTOMY      Current Medications: Current Meds  Medication Sig  . acetaminophen (TYLENOL) 500 MG tablet Take 500 mg by mouth every 6 (six) hours as needed for mild pain or moderate pain.  Marland Kitchen amLODipine (NORVASC) 10 MG tablet Take 10 mg by mouth daily.  Marland Kitchen amoxicillin (AMOXIL) 500 MG capsule TK 4 CS PO 1 HOUR PRIOR TO DENTAL APPOINTMENT  . aspirin EC 81 MG EC tablet Take 1 tablet (81 mg total) by mouth daily.  Marland Kitchen atorvastatin (LIPITOR) 20 MG tablet Take 20 mg by mouth at bedtime.  . ferrous gluconate (FERGON) 324 MG tablet Take 1 tablet (324 mg total) by mouth daily with breakfast.  . FLUZONE HIGH-DOSE 0.5 ML injection Inject as directed once.  Marland Kitchen lisinopril-hydrochlorothiazide (PRINZIDE,ZESTORETIC) 20-25 MG per tablet Take 1 tablet by  mouth daily.  . metFORMIN (GLUCOPHAGE) 1000 MG tablet   . SF 5000 PLUS 1.1 % CREA dental cream BRUSH TEETH HS WITH TOOTHPASTE THEN EXPECTORATE  . tamsulosin (FLOMAX) 0.4 MG CAPS capsule Take 0.4 mg by mouth as needed.     Allergies:   Crestor [rosuvastatin], Metoprolol, and Vytorin [ezetimibe-simvastatin]   Social History   Socioeconomic History  . Marital status: Married    Spouse name: Not on file  . Number of children: Not on file  . Years of education: Not on file  . Highest education level: Not on  file  Occupational History  . Not on file  Tobacco Use  . Smoking status: Former Smoker    Types: Cigarettes    Quit date: 09/26/1986    Years since quitting: 33.6  . Smokeless tobacco: Never Used  Vaping Use  . Vaping Use: Never used  Substance and Sexual Activity  . Alcohol use: No  . Drug use: No  . Sexual activity: Not on file  Other Topics Concern  . Not on file  Social History Narrative  . Not on file   Social Determinants of Health   Financial Resource Strain: Not on file  Food Insecurity: Not on file  Transportation Needs: Not on file  Physical Activity: Not on file  Stress: Not on file  Social Connections: Not on file     Family History: The patient's family history includes Diabetes in his father and mother; Hypertension in his unknown relative; Stroke in his father and mother. There is no history of Heart attack.  ROS:   Please see the history of present illness.     All other systems reviewed and are negative.  EKGs/Labs/Other Studies Reviewed:    The following studies were reviewed today:   Echocardiogram 11/17/2016: - Left ventricle: The cavity size was normal. Wall thickness was increased in a pattern of moderate LVH. There was mild focal basal hypertrophy of the septum. Systolic function was normal. The estimated ejection fraction was in the range of 60% to 65%. Wall motion was normal; there were no regional wall motion abnormalities. Features are consistent with a pseudonormal left ventricular filling pattern, with concomitant abnormal relaxation and increased filling pressure (grade 2 diastolic dysfunction). - Aortic valve: A bioprosthesis was present. - Mitral valve: Calcified annulus. - Left atrium: The atrium was severely dilated. - Right ventricle: The cavity size was mildly dilated. - Right atrium: The atrium was mildly dilated. - Pulmonary arteries: Systolic pressure was mildly increased.  Impressions:  - Normal LV  systolic function; moderate LVH; moderate diastolic dysfunction; s/p AVR with normal mean gradient and no AI; severeLAE; mild RAE and RVE; mild TR with mildly elevated pulmonary pressure.  EKG:  EKG is  ordered today.  The ekg ordered today demonstrates sinus rhythm 66 LVH with repolarization.  Recent Labs: No results found for requested labs within last 8760 hours.  Recent Lipid Panel No results found for: CHOL, TRIG, HDL, CHOLHDL, VLDL, LDLCALC, LDLDIRECT     Physical Exam:    VS:  BP 140/80   Pulse 66   Ht _0  (1.803 m)   Wt 138 lb 6.4 oz (62.8 kg)   SpO2 99%   BMI 19.30 kg/m     Wt Readings from Last 3 Encounters:  05/14/20 138 lb 6.4 oz (62.8 kg)  05/12/20 143 lb (64.9 kg)  05/13/19 142 lb 12.8 oz (64.8 kg)     GEN:  Well nourished, well developed in no acute distress  HEENT: Normal NECK: No JVD; No carotid bruits, radiation of heart murmur LYMPHATICS: No lymphadenopathy CARDIAC: RRR, 2/6 SM, no rubs, gallops RESPIRATORY:  Clear to auscultation without rales, wheezing or rhonchi  ABDOMEN: Soft, non-tender, non-distended MUSCULOSKELETAL:  No edema; No deformity  SKIN: Warm and dry NEUROLOGIC:  Alert and oriented x 3 PSYCHIATRIC:  Normal affect   ASSESSMENT:    1. S/P AVR (aortic valve replacement)   2. Essential hypertension   3. Aortic stenosis, severe    PLAN:    In order of problems listed above:  History of aortic valve replacement, bioprosthetic in 2015 status post severe aortic stenosis - Doing quite well.  Prior echocardiogram 2018 showed normal functioning valve. - We will repeat echocardiogram since has been several years.  Murmur heard on exam.  Radiation to the carotids. - Continue with dental antibiotics LDL 71 hemoglobin 13.4 creatinine 0.9 ALT 7  Essential hypertension - Multidrug regimen.  Previous low-dose metoprolol had been stopped.  Reviewed and managed by Dr. Dorthy Cooler.  Continuing with lisinopril hydrochlorothiazide as well as  amlodipine.  Blood pressure is being taken at home about once a week.  Usually his diastolic is in the 16X then.  Sometimes the systolic can be in the 450T he states.  Hepatocellular carcinoma - Prior ablative therapy at [facility] in 2019.  Prior MRI from 2021 showed slight decrease in size of ablation cavity.  No new hepatic lesions noted.  Diabetes with hypertension - Overall doing well, statin, prior hemoglobin A1c 6.1.  Protein calorie malnutrition - Continue to encourage protein intake.  Hard for him to gain weight.      Medication Adjustments/Labs and Tests Ordered: Current medicines are reviewed at length with the patient today.  Concerns regarding medicines are outlined above.  Orders Placed This Encounter  Procedures  . EKG 12-Lead  . ECHOCARDIOGRAM COMPLETE   No orders of the defined types were placed in this encounter.   Patient Instructions  Medication Instructions:  The current medical regimen is effective;  continue present plan and medications.  *If you need a refill on your cardiac medications before your next appointment, please call your pharmacy*  Testing/Procedures: Your physician has requested that you have an echocardiogram. Echocardiography is a painless test that uses sound waves to create images of your heart. It provides your doctor with information about the size and shape of your heart and how well your heart's chambers and valves are working. This procedure takes approximately one hour. There are no restrictions for this procedure.  Follow-Up: At [facility], you and your health needs are our priority.  As part of our continuing mission to provide you with exceptional heart care, we have created designated Provider Care Teams.  These Care Teams include your primary Cardiologist (physician) and Advanced Practice Providers (APPs -  Physician Assistants and Nurse Practitioners) who all work together to provide you with the care you need,  when you need it.  We recommend signing up for the patient portal called "MyChart".  Sign up information is provided on this After Visit Summary.  MyChart is used to connect with patients for Virtual Visits (Telemedicine).  Patients are able to view lab/test results, encounter notes, upcoming appointments, etc.  Non-urgent messages can be sent to your provider as well.   To learn more about what you can do with MyChart, go to [email or web address].    Your next appointment:   12 month(s)  The format for your next appointment:   In Person  Provider:   Candee Furbish, MD   Thank you for choosing [facility]!!         Signed, Candee Furbish, MD  05/14/2020 10:13 AM    [place]

## 2020-06-04 ENCOUNTER — Ambulatory Visit (HOSPITAL_COMMUNITY): Payer: Medicare Other | Attending: Internal Medicine

## 2020-06-04 ENCOUNTER — Other Ambulatory Visit: Payer: Self-pay

## 2020-06-04 DIAGNOSIS — I10 Essential (primary) hypertension: Secondary | ICD-10-CM | POA: Insufficient documentation

## 2020-06-04 DIAGNOSIS — I350 Nonrheumatic aortic (valve) stenosis: Secondary | ICD-10-CM | POA: Insufficient documentation

## 2020-06-04 DIAGNOSIS — Z952 Presence of prosthetic heart valve: Secondary | ICD-10-CM | POA: Insufficient documentation

## 2020-06-04 LAB — ECHOCARDIOGRAM COMPLETE
AR max vel: 1.53 cm2
AV Area VTI: 1.77 cm2
AV Area mean vel: 1.77 cm2
AV Mean grad: 13 mmHg
AV Peak grad: 22.1 mmHg
Ao pk vel: 2.35 m/s
Area-P 1/2: 2.66 cm2
S' Lateral: 2.3 cm

## 2020-07-14 ENCOUNTER — Ambulatory Visit (INDEPENDENT_AMBULATORY_CARE_PROVIDER_SITE_OTHER): Payer: Medicare Other | Admitting: Podiatry

## 2020-07-14 ENCOUNTER — Encounter: Payer: Self-pay | Admitting: Podiatry

## 2020-07-14 ENCOUNTER — Other Ambulatory Visit: Payer: Self-pay

## 2020-07-14 DIAGNOSIS — M2042 Other hammer toe(s) (acquired), left foot: Secondary | ICD-10-CM

## 2020-07-14 DIAGNOSIS — E7800 Pure hypercholesterolemia, unspecified: Secondary | ICD-10-CM | POA: Insufficient documentation

## 2020-07-14 DIAGNOSIS — E1149 Type 2 diabetes mellitus with other diabetic neurological complication: Secondary | ICD-10-CM

## 2020-07-14 DIAGNOSIS — B351 Tinea unguium: Secondary | ICD-10-CM | POA: Diagnosis not present

## 2020-07-14 DIAGNOSIS — N139 Obstructive and reflux uropathy, unspecified: Secondary | ICD-10-CM | POA: Insufficient documentation

## 2020-07-14 DIAGNOSIS — N529 Male erectile dysfunction, unspecified: Secondary | ICD-10-CM | POA: Insufficient documentation

## 2020-07-14 DIAGNOSIS — M79675 Pain in left toe(s): Secondary | ICD-10-CM

## 2020-07-14 DIAGNOSIS — Z952 Presence of prosthetic heart valve: Secondary | ICD-10-CM | POA: Insufficient documentation

## 2020-07-14 DIAGNOSIS — Q828 Other specified congenital malformations of skin: Secondary | ICD-10-CM

## 2020-07-14 DIAGNOSIS — N401 Enlarged prostate with lower urinary tract symptoms: Secondary | ICD-10-CM | POA: Insufficient documentation

## 2020-07-14 DIAGNOSIS — E1140 Type 2 diabetes mellitus with diabetic neuropathy, unspecified: Secondary | ICD-10-CM

## 2020-07-14 DIAGNOSIS — M79674 Pain in right toe(s): Secondary | ICD-10-CM | POA: Diagnosis not present

## 2020-07-14 DIAGNOSIS — M2041 Other hammer toe(s) (acquired), right foot: Secondary | ICD-10-CM

## 2020-07-14 DIAGNOSIS — L84 Corns and callosities: Secondary | ICD-10-CM | POA: Diagnosis not present

## 2020-07-19 NOTE — Progress Notes (Addendum)
Subjective: [name] presents today for at risk foot care with history of diabetic neuropathy, corn(s) right 4th toe , callus(es) right foot and painful mycotic nails.  Pain interferes with ambulation. Aggravating factors include wearing enclosed shoe gear. Painful toenails interfere with ambulation. Aggravating factors include wearing enclosed shoe gear. Pain is relieved with periodic professional debridement. Painful corns and calluses are aggravated when weightbearing with and without shoegear. Pain is relieved with periodic professional debridement. and painful porokeratotic lesions b/l.  Pain prevent comfortable ambulation. Aggravating factor is weightbearing with or without shoegear.  Patient's blood sugar was 97 mg/dl yesterday.  [name], [name], [name] is patient's PCP. Last visit was  01/22/2020.   Allergies  Allergen Reactions  . Crestor [Rosuvastatin]     Discomfort, RESTLESS, CAN'T SLEEP Discomfort, RESTLESS, CAN'T SLEEP Discomfort, RESTLESS, CAN'T SLEEP   . Ezetimibe-Simvastatin     Leg pains Restlessness at night   Other reaction(s): leg pain Leg pains Other reaction(s): leg pain   . Metoprolol     Other reaction(s): bradycardia  . Other     Other reaction(s): discomfort Other reaction(s): bradycardia   Social History   Occupational History  . Not on file  Tobacco Use  . Smoking status: Former Smoker    Types: Cigarettes    Quit date: 09/26/1986    Years since quitting: 33.8  . Smokeless tobacco: Never Used  Vaping Use  . Vaping Use: Never used  Substance and Sexual Activity  . Alcohol use: No  . Drug use: No  . Sexual activity: Not on file   Review of Systems: Negative except as noted in the HPI.  Objective: There were no vitals filed for this visit.  [name] is a pleasant 80 y.o. African American male WD, WN in NAD. AAO X 3.  Vascular Examination: Capillary refill time to digits immediate b/l. Palpable pedal pulses b/l LE. Pedal hair absent.  Lower extremity skin temperature gradient within normal limits.  Dermatological Examination: Pedal skin with normal turgor, texture and tone bilaterally. No open wounds bilaterally. No interdigital macerations bilaterally. Toenails 1-5 b/l elongated, discolored, dystrophic, thickened, crumbly with subungual debris and tenderness to dorsal palpation. Hyperkeratotic lesion(s) R 4th toe and 1st metatarsal head right foot.  No erythema, no edema, no drainage, no fluctuance. Porokeratotic lesion(s) submet head 2 left foot, submet head 2 right foot and submet head 3 right foot. No erythema, no edema, no drainage, no fluctuance.   Interdigital corn left 2nd toe remains resolved.  Musculoskeletal Examination: Normal muscle strength 5/5 to all lower extremity muscle groups bilaterally. No pain crepitus or joint limitation noted with ROM b/l. Hallux valgus with bunion deformity noted b/l lower extremities. Hammertoes noted to the 2-5 bilaterally.  Neurological Examination: Protective sensation diminished with 10g monofilament b/l. Vibratory sensation intact b/l. Clonus negative b/l.  Assessment: 1. Pain due to onychomycosis of toenails of both feet   2. Corns and callosities   3. Porokeratosis   4. Acquired hammertoes of both feet   5. Diabetic neuropathy with neurologic complication ([place])    Plan: -Examined patient. -No new findings. No new orders. -Continue diabetic foot care principles. -Patient to continue soft, supportive shoe gear daily. -Toenails 1-5 b/l were debrided in length and girth with sterile nail nippers and dremel without iatrogenic bleeding.  -Corn(s) R 4th toe and callus(es) 1st metatarsal head right foot were pared utilizing sterile scalpel blade without incident. Total number debrided =2. -Painful porokeratotic lesion(s) submet head 2 left foot, submet head 2  right foot and submet head 3 right foot pared and enucleated with sterile scalpel blade without incident. -Patient to  report any pedal injuries to medical professional immediately. -Patient/POA to call should there be question/concern in the interim.  Return in about 3 months (around 10/14/2020).  Marzetta Board, DPM

## 2020-07-21 DIAGNOSIS — E7800 Pure hypercholesterolemia, unspecified: Secondary | ICD-10-CM | POA: Diagnosis not present

## 2020-07-21 DIAGNOSIS — I10 Essential (primary) hypertension: Secondary | ICD-10-CM | POA: Diagnosis not present

## 2020-07-21 DIAGNOSIS — Z952 Presence of prosthetic heart valve: Secondary | ICD-10-CM | POA: Diagnosis not present

## 2020-07-21 DIAGNOSIS — E1169 Type 2 diabetes mellitus with other specified complication: Secondary | ICD-10-CM | POA: Diagnosis not present

## 2020-07-21 DIAGNOSIS — Z79899 Other long term (current) drug therapy: Secondary | ICD-10-CM | POA: Diagnosis not present

## 2020-07-21 DIAGNOSIS — N401 Enlarged prostate with lower urinary tract symptoms: Secondary | ICD-10-CM | POA: Diagnosis not present

## 2020-08-04 DIAGNOSIS — H5203 Hypermetropia, bilateral: Secondary | ICD-10-CM | POA: Diagnosis not present

## 2020-08-04 DIAGNOSIS — H524 Presbyopia: Secondary | ICD-10-CM | POA: Diagnosis not present

## 2020-08-04 DIAGNOSIS — E119 Type 2 diabetes mellitus without complications: Secondary | ICD-10-CM | POA: Diagnosis not present

## 2020-08-04 DIAGNOSIS — H2513 Age-related nuclear cataract, bilateral: Secondary | ICD-10-CM | POA: Diagnosis not present

## 2020-09-15 DIAGNOSIS — Z8505 Personal history of malignant neoplasm of liver: Secondary | ICD-10-CM | POA: Diagnosis not present

## 2020-09-15 DIAGNOSIS — Z1321 Encounter for screening for nutritional disorder: Secondary | ICD-10-CM | POA: Diagnosis not present

## 2020-09-15 DIAGNOSIS — K7469 Other cirrhosis of liver: Secondary | ICD-10-CM | POA: Diagnosis not present

## 2020-09-15 DIAGNOSIS — B1920 Unspecified viral hepatitis C without hepatic coma: Secondary | ICD-10-CM | POA: Diagnosis not present

## 2020-09-22 ENCOUNTER — Other Ambulatory Visit: Payer: Self-pay | Admitting: Family

## 2020-09-22 DIAGNOSIS — B1920 Unspecified viral hepatitis C without hepatic coma: Secondary | ICD-10-CM

## 2020-10-09 ENCOUNTER — Other Ambulatory Visit: Payer: Self-pay

## 2020-10-09 ENCOUNTER — Ambulatory Visit
Admission: RE | Admit: 2020-10-09 | Discharge: 2020-10-09 | Disposition: A | Discharge status: Home / Self Care | Payer: Medicare Other | Source: Ambulatory Visit | Attending: Family | Admitting: Family

## 2020-10-09 DIAGNOSIS — N281 Cyst of kidney, acquired: Secondary | ICD-10-CM | POA: Diagnosis not present

## 2020-10-09 DIAGNOSIS — K7469 Other cirrhosis of liver: Secondary | ICD-10-CM

## 2020-10-09 DIAGNOSIS — K7689 Other specified diseases of liver: Secondary | ICD-10-CM | POA: Diagnosis not present

## 2020-10-09 DIAGNOSIS — B1920 Unspecified viral hepatitis C without hepatic coma: Secondary | ICD-10-CM

## 2020-10-09 DIAGNOSIS — L989 Disorder of the skin and subcutaneous tissue, unspecified: Secondary | ICD-10-CM | POA: Diagnosis not present

## 2020-10-09 DIAGNOSIS — Z8505 Personal history of malignant neoplasm of liver: Secondary | ICD-10-CM | POA: Diagnosis not present

## 2020-10-09 MED ORDER — GADOBENATE DIMEGLUMINE 529 MG/ML IV SOLN
15.0000 mL | Freq: Once | INTRAVENOUS | Status: AC | PRN
  Administered 2020-10-09: 15 mL via INTRAVENOUS

## 2020-10-23 ENCOUNTER — Ambulatory Visit (INDEPENDENT_AMBULATORY_CARE_PROVIDER_SITE_OTHER): Payer: Medicare Other | Admitting: Podiatry

## 2020-10-23 ENCOUNTER — Encounter: Payer: Self-pay | Admitting: Podiatry

## 2020-10-23 ENCOUNTER — Other Ambulatory Visit: Payer: Self-pay

## 2020-10-23 DIAGNOSIS — M79675 Pain in left toe(s): Secondary | ICD-10-CM | POA: Diagnosis not present

## 2020-10-23 DIAGNOSIS — M79674 Pain in right toe(s): Secondary | ICD-10-CM | POA: Diagnosis not present

## 2020-10-23 DIAGNOSIS — E1140 Type 2 diabetes mellitus with diabetic neuropathy, unspecified: Secondary | ICD-10-CM

## 2020-10-23 DIAGNOSIS — E1149 Type 2 diabetes mellitus with other diabetic neurological complication: Secondary | ICD-10-CM

## 2020-10-23 DIAGNOSIS — L84 Corns and callosities: Secondary | ICD-10-CM

## 2020-10-23 DIAGNOSIS — B351 Tinea unguium: Secondary | ICD-10-CM

## 2020-10-23 DIAGNOSIS — Q828 Other specified congenital malformations of skin: Secondary | ICD-10-CM

## 2020-10-29 NOTE — Progress Notes (Signed)
Subjective:  Patient ID: [name], male    DOB: July 05, 1940,  MRN: [id number]  80 y.o. male presents at risk foot care with history of diabetic neuropathy and callus(es) b/l feet and painful thick toenails that are difficult to trim. Painful toenails interfere with ambulation. Aggravating factors include wearing enclosed shoe gear. Pain is relieved with periodic professional debridement. Painful calluses are aggravated when weightbearing with and without shoegear. Pain is relieved with periodic professional debridement.  Patient states blood glucose was 107 mg/dl today.  PCP is Dr. Lujean Amel and last visit was 07/21/2020.  He is requesting a new toe crest for his right foot on today's visit. He states it helps his right 4th digit corn more manageable.   Allergies  Allergen Reactions   Ezetimibe-Simvastatin     Leg pains Restlessness at night   Other reaction(s): leg pain Leg pains Other reaction(s): leg pain  Other reaction(s): leg pain   Metoprolol     Other reaction(s): bradycardia   Other     Other reaction(s): discomfort Other reaction(s): bradycardia   Rosuvastatin     Discomfort, RESTLESS, CAN'T SLEEP Discomfort, RESTLESS, CAN'T SLEEP Discomfort, RESTLESS, CAN'T SLEEP  Other reaction(s): discomfort    Review of Systems: Negative except as noted in the HPI.   Objective:   Constitutional Pt is a pleasant 80 y.o. African American male WD, WN in NAD. AAO x 3.   Vascular Capillary refill time to digits immediate b/l. Palpable DP pulse(s) b/l lower extremities Palpable PT pulse(s) b/l lower extremities Pedal hair absent. Lower extremity skin temperature gradient within normal limits. Patient wearing compression hose on today's visit. No edema noted b/l lower extremities.  Neurologic Protective sensation diminished with 10g monofilament b/l. Vibratory sensation intact b/l.  Dermatologic Toenails 1-5 b/l elongated, discolored, dystrophic, thickened, crumbly with  subungual debris and tenderness to dorsal palpation. Hyperkeratotic lesion(s) R 4th toe and 1st metatarsal head right foot.  No erythema, no edema, no drainage, no fluctuance. Porokeratotic lesion(s) submet head 2 left foot, submet head 2 right foot, and submet head 3 right foot. No erythema, no edema, no drainage, no fluctuance.  Orthopedic: Normal muscle strength 5/5 to all lower extremity muscle groups bilaterally. No pain crepitus or joint limitation noted with ROM b/l. Hallux valgus with bunion deformity noted b/l lower extremities. Hammertoe(s) noted to the 2-5 bilaterally.   Radiographs: None Assessment:   1. Pain due to onychomycosis of toenails of both feet   2. Corns and callosities   3. Porokeratosis   4. Diabetic neuropathy with neurologic complication [facility])     Plan:  Patient was evaluated and treated and all questions answered.  Onychomycosis with pain -Nails palliatively debridement as below. -Educated on self-care  Procedure: Nail Debridement Rationale: Pain Type of Debridement: manual, sharp debridement. Instrumentation: Nail nipper, rotary burr. Number of Nails: 10  -Continue diabetic foot care principles. -Patient to continue soft, supportive shoe gear daily. -Toenails 1-5 b/l were debrided in length and girth with sterile nail nippers and dremel without iatrogenic bleeding.  -Corn(s) R 4th toe and callus(es) 1st metatarsal head right foot were pared utilizing sterile scalpel blade without incident. Total number debrided =2. -Painful porokeratotic lesion(s) submet head 2 left foot, submet head 2 right foot, and submet head 3 right foot pared and enucleated with sterile scalpel blade without incident. Total number of lesions debrided=3. -Patient to report any pedal injuries to medical professional immediately. -Patient/POA to call should there be question/concern in the interim.  Return in about  3 months (around 01/23/2021).  Marzetta Board, DPM

## 2021-01-13 DIAGNOSIS — E7800 Pure hypercholesterolemia, unspecified: Secondary | ICD-10-CM | POA: Diagnosis not present

## 2021-01-13 DIAGNOSIS — I10 Essential (primary) hypertension: Secondary | ICD-10-CM | POA: Diagnosis not present

## 2021-01-13 DIAGNOSIS — N529 Male erectile dysfunction, unspecified: Secondary | ICD-10-CM | POA: Diagnosis not present

## 2021-01-13 DIAGNOSIS — Z23 Encounter for immunization: Secondary | ICD-10-CM | POA: Diagnosis not present

## 2021-01-13 DIAGNOSIS — Z952 Presence of prosthetic heart valve: Secondary | ICD-10-CM | POA: Diagnosis not present

## 2021-01-13 DIAGNOSIS — N401 Enlarged prostate with lower urinary tract symptoms: Secondary | ICD-10-CM | POA: Diagnosis not present

## 2021-01-13 DIAGNOSIS — R42 Dizziness and giddiness: Secondary | ICD-10-CM | POA: Diagnosis not present

## 2021-01-13 DIAGNOSIS — Z0001 Encounter for general adult medical examination with abnormal findings: Secondary | ICD-10-CM | POA: Diagnosis not present

## 2021-01-13 DIAGNOSIS — E1169 Type 2 diabetes mellitus with other specified complication: Secondary | ICD-10-CM | POA: Diagnosis not present

## 2021-01-13 DIAGNOSIS — Z79899 Other long term (current) drug therapy: Secondary | ICD-10-CM | POA: Diagnosis not present

## 2021-01-14 DIAGNOSIS — Z0001 Encounter for general adult medical examination with abnormal findings: Secondary | ICD-10-CM | POA: Diagnosis not present

## 2021-01-29 ENCOUNTER — Encounter: Payer: Self-pay | Admitting: Podiatry

## 2021-01-29 ENCOUNTER — Ambulatory Visit (INDEPENDENT_AMBULATORY_CARE_PROVIDER_SITE_OTHER): Payer: Medicare Other | Admitting: Podiatry

## 2021-01-29 ENCOUNTER — Other Ambulatory Visit: Payer: Self-pay

## 2021-01-29 DIAGNOSIS — B351 Tinea unguium: Secondary | ICD-10-CM

## 2021-01-29 DIAGNOSIS — M2041 Other hammer toe(s) (acquired), right foot: Secondary | ICD-10-CM | POA: Insufficient documentation

## 2021-01-29 DIAGNOSIS — M2140 Flat foot [pes planus] (acquired), unspecified foot: Secondary | ICD-10-CM

## 2021-01-29 DIAGNOSIS — L84 Corns and callosities: Secondary | ICD-10-CM | POA: Diagnosis not present

## 2021-01-29 DIAGNOSIS — E1141 Type 2 diabetes mellitus with diabetic mononeuropathy: Secondary | ICD-10-CM

## 2021-01-29 DIAGNOSIS — L851 Acquired keratosis [keratoderma] palmaris et plantaris: Secondary | ICD-10-CM

## 2021-01-29 DIAGNOSIS — M2011 Hallux valgus (acquired), right foot: Secondary | ICD-10-CM

## 2021-01-29 DIAGNOSIS — E1140 Type 2 diabetes mellitus with diabetic neuropathy, unspecified: Secondary | ICD-10-CM | POA: Insufficient documentation

## 2021-01-29 DIAGNOSIS — M2012 Hallux valgus (acquired), left foot: Secondary | ICD-10-CM | POA: Insufficient documentation

## 2021-01-29 DIAGNOSIS — E1169 Type 2 diabetes mellitus with other specified complication: Secondary | ICD-10-CM

## 2021-01-29 DIAGNOSIS — M2042 Other hammer toe(s) (acquired), left foot: Secondary | ICD-10-CM | POA: Insufficient documentation

## 2021-02-02 DIAGNOSIS — Z23 Encounter for immunization: Secondary | ICD-10-CM | POA: Diagnosis not present

## 2021-02-02 NOTE — Progress Notes (Signed)
  Subjective:  Patient ID: [name], male    DOB: 05/28/1940,  MRN: [id number]  80 y.o. male presents at risk foot care with history of diabetic neuropathy and callus(es) b/l feet and painful thick toenails that are difficult to trim. Painful toenails interfere with ambulation. Aggravating factors include wearing enclosed shoe gear. Pain is relieved with periodic professional debridement. Painful calluses are aggravated when weightbearing with and without shoegear. Pain is relieved with periodic professional debridement.  Patient states blood glucose was 102 mg/dl today.  PCP is Dr. Lujean Amel and last visit was 07/21/2020.   Allergies  Allergen Reactions   Ezetimibe-Simvastatin     Leg pains Restlessness at night   Other reaction(s): leg pain Leg pains Other reaction(s): leg pain  Other reaction(s): leg pain   Metoprolol     Other reaction(s): bradycardia Other reaction(s): bradycardia   Other     Other reaction(s): discomfort Other reaction(s): bradycardia   Rosuvastatin     Discomfort, RESTLESS, CAN'T SLEEP Discomfort, RESTLESS, CAN'T SLEEP Discomfort, RESTLESS, CAN'T SLEEP  Other reaction(s): discomfort    Review of Systems: Negative except as noted in the HPI.   Objective:   Constitutional Pt is a pleasant 80 y.o. African American male WD, WN in NAD. AAO x 3.   Vascular Capillary refill time to digits immediate b/l. Palpable DP pulse(s) b/l lower extremities Palpable PT pulse(s) b/l lower extremities Pedal hair absent. Lower extremity skin temperature gradient within normal limits. Patient wearing compression hose on today's visit. No edema noted b/l lower extremities.  Neurologic Protective sensation diminished with 10g monofilament b/l. Vibratory sensation intact b/l.  Dermatologic Toenails 1-5 b/l elongated, discolored, dystrophic, thickened, crumbly with subungual debris and tenderness to dorsal palpation. Hyperkeratotic lesion(s) R 4th toe and 1st metatarsal  head right foot.  No erythema, no edema, no drainage, no fluctuance. Porokeratotic lesion(s) submet head 2 left foot, submet head 2 right foot, and submet head 3 right foot. No erythema, no edema, no drainage, no fluctuance.  Orthopedic: Normal muscle strength 5/5 to all lower extremity muscle groups bilaterally. No pain crepitus or joint limitation noted with ROM b/l lower extremities. Hallux valgus with bunion deformity noted b/l lower extremities. Hammertoe(s) noted to the b/l lower extremities and 2-5 bilaterally. Pes planovalgus deformity noted b/l lower extremities.   Radiographs: None Assessment:   1. 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 Acquired plantar porokeratosis   3. Corns and callosities   4. Acquired pes planovalgus, unspecified laterality   5. Diabetic mononeuropathy associated with type 2 diabetes mellitus ([place])    Plan:  -Continue diabetic foot care principles. -Patient to continue soft, supportive shoe gear daily. -For pesplanovalgus, we discussed orthotics and bracing. Will revisit on next visit. -Toenails 1-5 b/l were debrided in length and girth with sterile nail nippers and dremel without iatrogenic bleeding.  -Corn(s) R 4th toe and callus(es) 1st metatarsal head right foot were pared utilizing sterile scalpel blade without incident. Total number debrided =2. -Painful porokeratotic lesion(s) submet head 2 left foot, submet head 2 right foot, and submet head 3 right foot pared and enucleated with sterile scalpel blade without incident. Total number of lesions debrided=3. -Patient to report any pedal injuries to medical professional immediately. -Patient/POA to call should there be question/concern in the interim.  Return in about 3 months (around 04/30/2021).  Marzetta Board, DPM

## 2021-02-24 DIAGNOSIS — N401 Enlarged prostate with lower urinary tract symptoms: Secondary | ICD-10-CM | POA: Diagnosis not present

## 2021-02-24 DIAGNOSIS — R351 Nocturia: Secondary | ICD-10-CM | POA: Diagnosis not present

## 2021-02-24 DIAGNOSIS — R350 Frequency of micturition: Secondary | ICD-10-CM | POA: Diagnosis not present

## 2021-05-11 ENCOUNTER — Other Ambulatory Visit: Payer: Self-pay

## 2021-05-11 ENCOUNTER — Ambulatory Visit (INDEPENDENT_AMBULATORY_CARE_PROVIDER_SITE_OTHER): Payer: Medicare Other | Admitting: Podiatry

## 2021-05-11 DIAGNOSIS — E1140 Type 2 diabetes mellitus with diabetic neuropathy, unspecified: Secondary | ICD-10-CM

## 2021-05-11 DIAGNOSIS — E1149 Type 2 diabetes mellitus with other diabetic neurological complication: Secondary | ICD-10-CM

## 2021-05-11 DIAGNOSIS — M2140 Flat foot [pes planus] (acquired), unspecified foot: Secondary | ICD-10-CM

## 2021-05-11 DIAGNOSIS — E1169 Type 2 diabetes mellitus with other specified complication: Secondary | ICD-10-CM

## 2021-05-11 DIAGNOSIS — E119 Type 2 diabetes mellitus without complications: Secondary | ICD-10-CM | POA: Diagnosis not present

## 2021-05-11 DIAGNOSIS — Q828 Other specified congenital malformations of skin: Secondary | ICD-10-CM | POA: Diagnosis not present

## 2021-05-11 DIAGNOSIS — L84 Corns and callosities: Secondary | ICD-10-CM

## 2021-05-11 DIAGNOSIS — B351 Tinea unguium: Secondary | ICD-10-CM | POA: Diagnosis not present

## 2021-05-11 NOTE — Progress Notes (Signed)
ANNUAL DIABETIC FOOT EXAM  Subjective: [name] presents today for for annual diabetic foot examination.  Patient relates 89 year h/o diabetes.  Patient has h/o toe ulcer left 2nd toe which has healed.  Patient has symptoms of foot tingling right foot  Patient's blood sugar was 102 mg/dl today.   [name], [name], MD is patient's PCP. Last visit was 07/21/2020.  Past Medical History:  Diagnosis Date   Acute epididymo-orchitis    Aortic stenosis    s/p bioprosthetic AVR (Dr. Cyndia Bent) 11/2013   Arthritis    Diabetes mellitus without complication [facility])    ED (erectile dysfunction)    Essential hypertension, benign    Heart murmur    Hep C w/o coma, chronic ([place])    History of blood transfusion    History of DVT of lower extremity    Hx of echocardiogram    Echo (8/15):  Severe LVH, EF 60-65%, no RWMA, Gr 3 DD, AVR ok (mean 21 mmHg), MAC, mild LAE, mild to mod RAE   Hypercholesterolemia    Patient Active Problem List   Diagnosis Date Noted   Acquired plantar porokeratosis    Corns and callosities    Diabetic neuropathy (HCC)    Hammertoes of both feet    Hallux valgus, acquired, bilateral    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 ([place])    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   Benign prostatic hyperplasia with lower urinary tract symptoms 07/14/2020   Obstructive uropathy 07/14/2020   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   Pure hypercholesterolemia 07/14/2020   Hepatocellular carcinoma ([place]) 10/19/2017   Elevated LFTs 08/26/2014   S/P AVR (aortic valve replacement) 11/22/2013   Aortic stenosis, severe 11/19/2013   Aortic sclerosis 11/06/2013   Aortic stenosis 09/03/2013   Hyperlipidemia 09/03/2013   Weight loss 09/03/2013   Coronary artery disease 09/03/2013   Hepatitis C 05/29/2013   Unspecified gastritis and gastroduodenitis without mention of hemorrhage 04/24/2013   Iron deficiency anemia due to chronic blood loss  04/23/2013   Hypertension    Diabetes mellitus without complication ([place])    Shortness of breath 04/22/2013   Past Surgical History:  Procedure Laterality Date   AORTIC VALVE REPLACEMENT N/A 11/19/2013   Procedure: AORTIC VALVE REPLACEMENT (AVR);  Surgeon: Gaye Pollack, MD;  Location: [place];  Service: Open Heart Surgery;  Laterality: N/A;   BUNIONECTOMY Bilateral 2008   CARDIAC CATHETERIZATION  10/01/13   COLONOSCOPY  01/2010   internal hemorrhoids.  Dr Deatra Ina   ESOPHAGOGASTRODUODENOSCOPY N/A 04/24/2013   Procedure: ESOPHAGOGASTRODUODENOSCOPY (EGD);  Surgeon: Jerene Bears, MD;  Location: [facility] ENDOSCOPY;  Service: Endoscopy;  Laterality: N/A;   INGUINAL HERNIA REPAIR Right    INTRAOPERATIVE TRANSESOPHAGEAL ECHOCARDIOGRAM N/A 11/19/2013   Procedure: INTRAOPERATIVE TRANSESOPHAGEAL ECHOCARDIOGRAM;  Surgeon: Gaye Pollack, MD;  Location: [place] OR;  Service: Open Heart Surgery;  Laterality: N/A;   KNEE ARTHROSCOPY Left 05/2009   Dr Collier Salina   LAMINECTOMY  06/2005   Dr Joya Salm. multiple lumbar level laminectomies.    LEFT AND RIGHT HEART CATHETERIZATION WITH CORONARY ANGIOGRAM N/A 10/01/2013   Procedure: LEFT AND RIGHT HEART CATHETERIZATION WITH CORONARY ANGIOGRAM;  Surgeon: Candee Furbish, MD;  Location: [facility] CATH LAB;  Service: Cardiovascular;  Laterality: N/A;   TEE WITHOUT CARDIOVERSION  11/2003   Aoritic valve sclerosis.    TONSILLECTOMY     Current Outpatient Medications on File Prior to Visit  Medication Sig Dispense Refill   tamsulosin (FLOMAX) 0.4 MG CAPS capsule  Take by mouth.     acetaminophen (TYLENOL) 500 MG tablet Take 500 mg by mouth every 6 (six) hours as needed for mild pain or moderate pain.     amLODipine (NORVASC) 10 MG tablet 1 tablet     amoxicillin (AMOXIL) 500 MG capsule TK 4 CS PO 1 HOUR PRIOR TO DENTAL APPOINTMENT     aspirin EC 81 MG EC tablet Take 1 tablet (81 mg total) by mouth daily.     atorvastatin (LIPITOR) 20 MG tablet Take 1 tablet by mouth daily.     cephALEXin  (KEFLEX) 500 MG capsule      Cholecalciferol (VITAMIN D) 50 MCG (2000 UT) tablet      Cholecalciferol (VITAMIN D) 50 MCG (2000 UT) tablet 1 tablet     ferrous gluconate (FERGON) 324 MG tablet Take 1 tablet (324 mg total) by mouth daily with breakfast. 30 tablet 3   Ferrous Sulfate (IRON) 325 (65 Fe) MG TABS 1 tablet     FLUZONE HIGH-DOSE 0.5 ML injection Inject as directed once.  0   glucose blood test strip      glucose blood test strip as directed DX 250     lisinopril-hydrochlorothiazide (PRINZIDE,ZESTORETIC) 20-25 MG per tablet Take 1 tablet by mouth daily.     metFORMIN (GLUCOPHAGE) 1000 MG tablet Take 1 tablet by mouth 2 (two) times daily with a meal.     metFORMIN (GLUCOPHAGE) 500 MG tablet Take 500 mg by mouth 2 (two) times daily with a meal. Take 589m by mouth twice daily     mupirocin ointment (BACTROBAN) 2 % APPLY TO LEFT  SECONDTOE ONCE DAILY WITH DRESSING     SF 5000 PLUS 1.1 % CREA dental cream BRUSH TEETH HS WITH TOOTHPASTE THEN EXPECTORATE  2   No current facility-administered medications on file prior to visit.    Allergies  Allergen Reactions   Ezetimibe-Simvastatin     Leg pains Restlessness at night   Other reaction(s): leg pain Leg pains Other reaction(s): leg pain  Other reaction(s): leg pain   Metoprolol     Other reaction(s): bradycardia Other reaction(s): bradycardia   Other     Other reaction(s): discomfort Other reaction(s): bradycardia   Rosuvastatin     Discomfort, RESTLESS, CAN'T SLEEP Discomfort, RESTLESS, CAN'T SLEEP Discomfort, RESTLESS, CAN'T SLEEP  Other reaction(s): discomfort   Social History   Occupational History   Not on file  Tobacco Use   Smoking status: Former    Types: Cigarettes    Quit date: 09/26/1986    Years since quitting: 34.6   Smokeless tobacco: Never  Vaping Use   Vaping Use: Never used  Substance and Sexual Activity   Alcohol use: No   Drug use: No   Sexual activity: Not on file   Family History  Problem  Relation Age of Onset   Diabetes Father    Stroke Father    Diabetes Mother    Stroke Mother    Hypertension Unknown        family history   Heart attack Neg Hx    Immunization History  Administered Date(s) Administered   Influenza, High Dose Seasonal PF 02/03/2017, 02/06/2018, 02/05/2019     Review of Systems: Negative except as noted in the HPI.   Objective: There were no vitals filed for this visit.  Daniel RADUis a pleasant 81y.o. male in NAD. AAO X 3.  Vascular Examination: Capillary refill time to digits immediate b/l. Palpable pedal pulses b/l LE. Pedal  hair absent. No pain with calf compression b/l. No edema noted b/l LE. No cyanosis or clubbing noted b/l LE.  Dermatological Examination: Pedal integument with normal turgor, texture and tone BLE. No open wounds b/l LE. No interdigital macerations noted b/l LE. Toenails 1-5 b/l elongated, discolored, dystrophic, thickened, crumbly with subungual debris and tenderness to dorsal palpation. Hyperkeratotic lesion(s) R 4th toe and 1st metatarsal head right foot.  No erythema, no edema, no drainage, no fluctuance. Porokeratotic lesion(s) submet head 2 b/l and submet head 3 right foot. No erythema, no edema, no drainage, no fluctuance.  Musculoskeletal Examination: Muscle strength 5/5 to all lower extremity muscle groups bilaterally. HAV with bunion deformity noted b/l LE. Hammertoe deformity noted 2-5 b/l. Pes planovalgus deformity noted b/l lower extremities. Patient ambulates independent of any assistive aids.  Footwear Assessment: Does the patient wear appropriate shoes? Yes. Does the patient need inserts/orthotics? Yes.  Neurological Examination: Protective sensation diminished with 10g monofilament b/l. Vibratory sensation intact b/l.  Assessment: 1. 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 ([place])   2. Corns and callosities   3. Porokeratosis   4. Acquired pes planovalgus, unspecified  laterality   5. Diabetic neuropathy with neurologic complication ([place])   6. Encounter for diabetic foot exam ([place])     ADA FOOT RISK CATEGORIZATION:  High Risk  Patient has one or more of the following: Loss of protective sensation Absent pedal pulses Severe Foot deformity History of foot ulcer   Plan: -Examined patient. -Discussed benefits of bracing for his pes planovalgus to prevent further deforming forces as he is totally collapsed plantarly. Schedule appointment with Pedorthist. -Diabetic foot examination performed today. -Mycotic toenails 1-5 bilaterally were debrided in length and girth with sterile nail nippers and dremel without incident. -Corn(s) R 4th toe and callus(es) 1st metatarsal head right foot were pared utilizing sterile scalpel blade without incident. Total number debrided =2. -Painful porokeratotic lesion(s) submet head 2 b/l and submet head 3 right foot pared and enucleated with sterile scalpel blade without incident. Total number of lesions debrided=3. -Patient/POA to call should there be question/concern in the interim.  Return in about 3 months (around 08/09/2021).  Marzetta Board, DPM

## 2021-05-13 ENCOUNTER — Other Ambulatory Visit: Payer: Medicare Other

## 2021-05-15 ENCOUNTER — Encounter: Payer: Self-pay | Admitting: Podiatry

## 2021-05-18 ENCOUNTER — Ambulatory Visit: Payer: Medicare Other

## 2021-05-18 ENCOUNTER — Other Ambulatory Visit: Payer: Self-pay

## 2021-05-18 DIAGNOSIS — M2140 Flat foot [pes planus] (acquired), unspecified foot: Secondary | ICD-10-CM

## 2021-05-18 DIAGNOSIS — E1141 Type 2 diabetes mellitus with diabetic mononeuropathy: Secondary | ICD-10-CM

## 2021-05-18 NOTE — Progress Notes (Signed)
SITUATION Reason for Consult: Evaluation for Prefabricated Diabetic Shoes and Bilateral Custom Diabetic Inserts. Patient / Caregiver Report: Patient is ready for his next set of diabetic shoes  OBJECTIVE DATA: Patient History / Diagnosis:    ICD-10-CM   1. 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 Acquired pes planovalgus, unspecified laterality  M21.40       Presence of Diabetic Complications: - Peripheral Neuropathy  Current or Previous Devices: A5514 insoles and cloth diabetic shoes  In-Person Foot Examination:  Skin presentation:   Thin, Shiny, Hairless Nail presentation:   Thick, Ingrown, With Fungus Ulcers & Callousing:   None  Toe / Foot Deformities:   - Pes Planus  - Hindfoot Valgus - Forefoot ABduction  - Hammertoes - Midfoot collapse   Sensation:    Intact   Shoe Size: 12.844M  ORTHOTIC RECOMMENDATION Recommended Devices: - 1x pair prefabricated PDAC approved diabetic shoes: X522M Boss Runner White 12.844M - 3x pair custom-to-patient vacuum formed diabetic insoles.   GOALS OF SHOES AND INSOLES - Reduce shear and pressure - Reduce / Prevent callus formation - Reduce / Prevent ulceration - Protect the fragile healing compromised diabetic foot.  Patient would benefit from diabetic shoes and inserts as patient has diabetes mellitus and the patient has one or more of the following conditions: - Peripheral neuropathy with evidence of callus formation - Foot deformity - Poor circulation  ACTIONS PERFORMED Patient was casted for insoles via crush box and measured for shoes via brannock device. Procedure was explained and patient tolerated procedure well. All questions were answered and concerns addressed.  PLAN Insurance to be verified and out of pocket cost communicated to patient. Once cost verified and agreed upon and diabetic certification received, casts are to be sent to [facility] for fabrication. Patient is to be  called for fitting when devices are ready.

## 2021-06-02 ENCOUNTER — Other Ambulatory Visit: Payer: Self-pay

## 2021-06-02 ENCOUNTER — Encounter: Payer: Self-pay | Admitting: Cardiology

## 2021-06-02 ENCOUNTER — Ambulatory Visit (INDEPENDENT_AMBULATORY_CARE_PROVIDER_SITE_OTHER): Payer: Medicare Other | Admitting: Cardiology

## 2021-06-02 VITALS — BP 150/70 | HR 72 | Ht 71.0 in | Wt 138.0 lb

## 2021-06-02 DIAGNOSIS — C220 Liver cell carcinoma: Secondary | ICD-10-CM | POA: Diagnosis not present

## 2021-06-02 DIAGNOSIS — E7800 Pure hypercholesterolemia, unspecified: Secondary | ICD-10-CM

## 2021-06-02 DIAGNOSIS — E119 Type 2 diabetes mellitus without complications: Secondary | ICD-10-CM | POA: Diagnosis not present

## 2021-06-02 DIAGNOSIS — I2583 Coronary atherosclerosis due to lipid rich plaque: Secondary | ICD-10-CM | POA: Diagnosis not present

## 2021-06-02 DIAGNOSIS — I10 Essential (primary) hypertension: Secondary | ICD-10-CM

## 2021-06-02 DIAGNOSIS — R011 Cardiac murmur, unspecified: Secondary | ICD-10-CM

## 2021-06-02 DIAGNOSIS — I2510 Atherosclerotic heart disease of native coronary artery without angina pectoris: Secondary | ICD-10-CM

## 2021-06-02 DIAGNOSIS — Z952 Presence of prosthetic heart valve: Secondary | ICD-10-CM | POA: Diagnosis not present

## 2021-06-02 NOTE — Assessment & Plan Note (Signed)
Had prior ablative therapy at [facility] in 2019.  Subsequent MRI in 2021 showed no new lesions.  Cavity size was decreasing.

## 2021-06-02 NOTE — Progress Notes (Signed)
Cardiology Office Note:    Date:  06/02/2021   ID:  HENOK HEACOCK, DOB Feb 28, 1941, MRN [id number]  PCP:  Lujean Amel, MD   [facility] Providers Cardiologist:  Candee Furbish, MD     Referring MD: Lujean Amel, MD    History of Present Illness:    [name] is a 81 y.o. male here for the follow-up of aortic valve replacement, bioprosthetic 2015, hepatocellular carcinoma previously treated hepatitis C and bilateral carotid artery disease which is mild.  Has been thin difficulty maintaining his weight.  Drinks Ensure.  Previously we had stopped his metoprolol because of disequilibrium when he stood up too quickly.  Prominent murmur heard in the apex.  Denies any change in shortness of breath or significant shortness of breath.  He takes dental prophylaxis.  No chest pain no fevers chills nausea vomiting syncope.    Past Medical History:  Diagnosis Date   Acute epididymo-orchitis    Aortic stenosis    s/p bioprosthetic AVR (Dr. Cyndia Bent) 11/2013   Arthritis    Diabetes mellitus without complication [facility])    ED (erectile dysfunction)    Essential hypertension, benign    Heart murmur    Hep C w/o coma, chronic ([place])    History of blood transfusion    History of DVT of lower extremity    Hx of echocardiogram    Echo (8/15):  Severe LVH, EF 60-65%, no RWMA, Gr 3 DD, AVR ok (mean 21 mmHg), MAC, mild LAE, mild to mod RAE   Hypercholesterolemia     Past Surgical History:  Procedure Laterality Date   AORTIC VALVE REPLACEMENT N/A 11/19/2013   Procedure: AORTIC VALVE REPLACEMENT (AVR);  Surgeon: Gaye Pollack, MD;  Location: [place];  Service: Open Heart Surgery;  Laterality: N/A;   BUNIONECTOMY Bilateral 2008   CARDIAC CATHETERIZATION  10/01/13   COLONOSCOPY  01/2010   internal hemorrhoids.  Dr Deatra Ina   ESOPHAGOGASTRODUODENOSCOPY N/A 04/24/2013   Procedure: ESOPHAGOGASTRODUODENOSCOPY (EGD);  Surgeon: Jerene Bears, MD;  Location: [facility] ENDOSCOPY;  Service: Endoscopy;  Laterality:  N/A;   INGUINAL HERNIA REPAIR Right    INTRAOPERATIVE TRANSESOPHAGEAL ECHOCARDIOGRAM N/A 11/19/2013   Procedure: INTRAOPERATIVE TRANSESOPHAGEAL ECHOCARDIOGRAM;  Surgeon: Gaye Pollack, MD;  Location: [place] OR;  Service: Open Heart Surgery;  Laterality: N/A;   KNEE ARTHROSCOPY Left 05/2009   Dr Collier Salina   LAMINECTOMY  06/2005   Dr Joya Salm. multiple lumbar level laminectomies.    LEFT AND RIGHT HEART CATHETERIZATION WITH CORONARY ANGIOGRAM N/A 10/01/2013   Procedure: LEFT AND RIGHT HEART CATHETERIZATION WITH CORONARY ANGIOGRAM;  Surgeon: Candee Furbish, MD;  Location[facility] CATH LAB;  Service: Cardiovascular;  Laterality: N/A;   TEE WITHOUT CARDIOVERSION  11/2003   Aoritic valve sclerosis.    TONSILLECTOMY      Current Medications: Current Meds  Medication Sig   acetaminophen (TYLENOL) 500 MG tablet Take 500 mg by mouth every 6 (six) hours as needed for mild pain or moderate pain.   amLODipine (NORVASC) 10 MG tablet 1 tablet   amoxicillin (AMOXIL) 500 MG capsule TK 4 CS PO 1 HOUR PRIOR TO DENTAL APPOINTMENT   aspirin EC 81 MG EC tablet Take 1 tablet (81 mg total) by mouth daily.   atorvastatin (LIPITOR) 20 MG tablet Take 1 tablet by mouth daily.   ferrous gluconate (FERGON) 324 MG tablet Take 1 tablet (324 mg total) by mouth daily with breakfast.   Ferrous Sulfate (IRON) 325 (65 Fe) MG TABS 1 tablet  FLUZONE HIGH-DOSE 0.5 ML injection Inject as directed once.   glucose blood test strip    glucose blood test strip as directed DX 250   lisinopril-hydrochlorothiazide (PRINZIDE,ZESTORETIC) 20-25 MG per tablet Take 1 tablet by mouth daily.   metFORMIN (GLUCOPHAGE) 1000 MG tablet Take 1 tablet by mouth 2 (two) times daily with a meal.   mupirocin ointment (BACTROBAN) 2 % APPLY TO LEFT  SECONDTOE ONCE DAILY WITH DRESSING   SF 5000 PLUS 1.1 % CREA dental cream BRUSH TEETH HS WITH TOOTHPASTE THEN EXPECTORATE   tamsulosin (FLOMAX) 0.4 MG CAPS capsule Take by mouth.     Allergies:   Ezetimibe-simvastatin,  Metoprolol, Other, and Rosuvastatin   Social History   Socioeconomic History   Marital status: Married    Spouse name: Not on file   Number of children: Not on file   Years of education: Not on file   Highest education level: Not on file  Occupational History   Not on file  Tobacco Use   Smoking status: Former    Types: Cigarettes    Quit date: 09/26/1986    Years since quitting: 34.7   Smokeless tobacco: Never  Vaping Use   Vaping Use: Never used  Substance and Sexual Activity   Alcohol use: No   Drug use: No   Sexual activity: Not on file  Other Topics Concern   Not on file  Social History Narrative   Not on file   Social Determinants of Health   Financial Resource Strain: Not on file  Food Insecurity: Not on file  Transportation Needs: Not on file  Physical Activity: Not on file  Stress: Not on file  Social Connections: Not on file     Family History: The patient's family history includes Diabetes in his father and mother; Hypertension in his unknown relative; Stroke in his father and mother. There is no history of Heart attack.  ROS:   Please see the history of present illness.     All other systems reviewed and are negative.  EKGs/Labs/Other Studies Reviewed:    The following studies were reviewed today:  Echocardiogram 06/04/2020-Normal pump function.  Mitral annular calcification noted.  Normal functioning bioprosthetic aortic valve.   Carotid Doppler 11/14/2013-mild disease bilaterally  EKG:  EKG is  ordered today.  The ekg ordered today demonstrates sinus rhythm 72 with nonspecific ST-T wave changes, LVH type pattern V4 through V6 prior EKG showed sinus rhythm 66 with LVH  Recent Labs: No results found for requested labs within last 8760 hours.  Recent Lipid Panel No results found for: CHOL, TRIG, HDL, CHOLHDL, VLDL, LDLCALC, LDLDIRECT   Risk Assessment/Calculations:              Physical Exam:    VS:  BP (!) 150/70 (BP Location: Left Arm,  Patient Position: Sitting, Cuff Size: Normal)    Pulse 72    Ht _0  (1.803 m)    Wt 138 lb (62.6 kg)    SpO2 98%    BMI 19.25 kg/m     Wt Readings from Last 3 Encounters:  06/02/21 138 lb (62.6 kg)  05/14/20 138 lb 6.4 oz (62.8 kg)  05/12/20 143 lb (64.9 kg)     GEN:  Well nourished, well developed in no acute distress HEENT: Normal NECK: No JVD; No carotid bruits LYMPHATICS: No lymphadenopathy CARDIAC: RRR, 3/6 holosystolic apical murmur, no rubs, gallops RESPIRATORY:  Clear to auscultation without rales, wheezing or rhonchi  ABDOMEN: Soft, non-tender, non-distended MUSCULOSKELETAL:  No edema; No deformity  SKIN: Warm and dry NEUROLOGIC:  Alert and oriented x 3 PSYCHIATRIC:  Normal affect   ASSESSMENT:    1. S/P AVR (aortic valve replacement)   2. Pure hypercholesterolemia   3. Hepatocellular carcinoma ([place])   4. Diabetes mellitus with coincident hypertension ([place])   5. Coronary artery disease due to lipid rich plaque   6. Heart murmur    PLAN:    In order of problems listed above:  S/P AVR (aortic valve replacement) Echocardiogram as above showed normal functioning 21 mm pericardial Magna Ease valve placed on 2015.  Dr. Cyndia Bent.  Continue with dental prophylaxis.  Murmur heard on exam.  Pure hypercholesterolemia LDL 71 at last check.  Continue with atorvastatin 20 mg a day.  Prior hepatocellular carcinoma.  No myalgias.  Hepatocellular carcinoma [facility]) Had prior ablative therapy at [facility] in 2019.  Subsequent MRI in 2021 showed no new lesions.  Cavity size was decreasing.  Diabetes mellitus with coincident hypertension (HCC) Prior hemoglobin A1c 6.1.  Statin therapy.  He is on low-dose aspirin.  He is on ACE inhibitor for renal protection.  Blood pressure control.  Also on HCTZ.  Doing well.  Coronary artery disease Previously nonobstructive CAD prior to aortic valve replacement.  Continue with aspirin, statin.  No signs of angina.  Heart  murmur Fairly prominent holosystolic murmur heard at the apex and the mitral valve position.  I will check an echocardiogram to make sure that there is been no degenerative mitral valve disease.  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  Today seems more prominent.         Medication Adjustments/Labs and Tests Ordered: Current medicines are reviewed at length with the patient today.  Concerns regarding medicines are outlined above.  Orders Placed This Encounter  Procedures   EKG 12-Lead   ECHOCARDIOGRAM COMPLETE   No orders of the defined types were placed in this encounter.   Patient Instructions  Medication Instructions:  Your physician recommends that you continue on your current medications as directed. Please refer to the Current Medication list given to you today.   *If you need a refill on your cardiac medications before your next appointment, please call your pharmacy*   Lab Work: None today If you have labs (blood work) drawn today and your tests are completely normal, you will receive your results only by: [place] (if you have MyChart) OR A paper copy in the mail If you have any lab test that is abnormal or we need to change your treatment, we will call you to review the results.   Testing/Procedures: Your physician has requested that you have an echocardiogram. Echocardiography is a painless test that uses sound waves to create images of your heart. It provides your doctor with information about the size and shape of your heart and how well your hearts chambers and valves are working. This procedure takes approximately one hour. There are no restrictions for this procedure.    Follow-Up: At [facility], you and your health needs are our priority.  As part of our continuing mission to provide you with exceptional heart care, we have created designated Provider Care Teams.  These Care Teams include your primary  Cardiologist (physician) and Advanced Practice Providers (APPs -  Physician Assistants and Nurse Practitioners) who all work together to provide you with the care you need, when you need it.  We recommend signing up for the patient portal  called "MyChart".  Sign up information is provided on this After Visit Summary.  MyChart is used to connect with patients for Virtual Visits (Telemedicine).  Patients are able to view lab/test results, encounter notes, upcoming appointments, etc.  Non-urgent messages can be sent to your provider as well.   To learn more about what you can do with MyChart, go to [email or web address].    Your next appointment:   1 year(s)  The format for your next appointment:   In Person  Provider:   Candee Furbish, MD        Signed, Candee Furbish, MD  06/02/2021 12:30 PM    [place]

## 2021-06-02 NOTE — Assessment & Plan Note (Signed)
Previously nonobstructive CAD prior to aortic valve replacement.  Continue with aspirin, statin.  No signs of angina.

## 2021-06-02 NOTE — Assessment & Plan Note (Signed)
Echocardiogram as above showed normal functioning 21 mm pericardial Magna Ease valve placed on 2015.  Dr. Cyndia Bent.  Continue with dental prophylaxis.  Murmur heard on exam.

## 2021-06-02 NOTE — Patient Instructions (Signed)
Medication Instructions:  Your physician recommends that you continue on your current medications as directed. Please refer to the Current Medication list given to you today.   *If you need a refill on your cardiac medications before your next appointment, please call your pharmacy*   Lab Work: None today If you have labs (blood work) drawn today and your tests are completely normal, you will receive your results only by: [place] (if you have MyChart) OR A paper copy in the mail If you have any lab test that is abnormal or we need to change your treatment, we will call you to review the results.   Testing/Procedures: Your physician has requested that you have an echocardiogram. Echocardiography is a painless test that uses sound waves to create images of your heart. It provides your doctor with information about the size and shape of your heart and how well your hearts chambers and valves are working. This procedure takes approximately one hour. There are no restrictions for this procedure.    Follow-Up: At [facility], you and your health needs are our priority.  As part of our continuing mission to provide you with exceptional heart care, we have created designated Provider Care Teams.  These Care Teams include your primary Cardiologist (physician) and Advanced Practice Providers (APPs -  Physician Assistants and Nurse Practitioners) who all work together to provide you with the care you need, when you need it.  We recommend signing up for the patient portal called "MyChart".  Sign up information is provided on this After Visit Summary.  MyChart is used to connect with patients for Virtual Visits (Telemedicine).  Patients are able to view lab/test results, encounter notes, upcoming appointments, etc.  Non-urgent messages can be sent to your provider as well.   To learn more about what you can do with MyChart, go to [email or web address].    Your next appointment:   1  year(s)  The format for your next appointment:   In Person  Provider:   Candee Furbish, MD

## 2021-06-02 NOTE — Assessment & Plan Note (Signed)
Fairly prominent holosystolic murmur heard at the apex and the mitral valve position.  I will check an echocardiogram to make sure that there is been no degenerative mitral valve disease.  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  Today seems more prominent.

## 2021-06-02 NOTE — Assessment & Plan Note (Signed)
LDL 71 at last check.  Continue with atorvastatin 20 mg a day.  Prior hepatocellular carcinoma.  No myalgias.

## 2021-06-02 NOTE — Assessment & Plan Note (Signed)
Prior hemoglobin A1c 6.1.  Statin therapy.  He is on low-dose aspirin.  He is on ACE inhibitor for renal protection.  Blood pressure control.  Also on HCTZ.  Doing well.

## 2021-06-14 ENCOUNTER — Other Ambulatory Visit: Payer: Self-pay

## 2021-06-14 ENCOUNTER — Ambulatory Visit (INDEPENDENT_AMBULATORY_CARE_PROVIDER_SITE_OTHER): Payer: Medicare Other

## 2021-06-14 DIAGNOSIS — L84 Corns and callosities: Secondary | ICD-10-CM | POA: Diagnosis not present

## 2021-06-14 DIAGNOSIS — E1142 Type 2 diabetes mellitus with diabetic polyneuropathy: Secondary | ICD-10-CM | POA: Diagnosis not present

## 2021-06-14 DIAGNOSIS — M2140 Flat foot [pes planus] (acquired), unspecified foot: Secondary | ICD-10-CM | POA: Diagnosis not present

## 2021-06-14 DIAGNOSIS — M2041 Other hammer toe(s) (acquired), right foot: Secondary | ICD-10-CM

## 2021-06-14 DIAGNOSIS — M2042 Other hammer toe(s) (acquired), left foot: Secondary | ICD-10-CM

## 2021-06-14 DIAGNOSIS — Z872 Personal history of diseases of the skin and subcutaneous tissue: Secondary | ICD-10-CM

## 2021-06-14 NOTE — Progress Notes (Signed)
SITUATION Reason for Visit: Fitting of Diabetic Shoes & Insoles Patient / Caregiver Report:  Patient reports comfort and is satisfied  OBJECTIVE DATA: Patient History / Diagnosis:     ICD-10-CM   1. Diabetic peripheral neuropathy associated with type 2 diabetes mellitus (HCC)  E11.42     2. Acquired pes planovalgus, unspecified laterality  M21.40     3. Corns and callosities  L84     4. Acquired hammertoes of both feet  M20.41    M20.42     5. History of foot ulcer  Z87.2       Change in Status:   None  ACTIONS PERFORMED: In-Person Delivery, patient was fit with: - 1x pair A5500 PDAC approved prefabricated Diabetic Shoes: Apex V552 boss runner - 3x pair [id number] PDAC approved vacuum formed custom diabetic insoles  Shoes and insoles were verified for structural integrity and safety. Patient wore shoes and insoles in office. Skin was inspected and free of areas of concern after wearing shoes and inserts. Shoes and inserts fit properly. Patient / Caregiver provided with ferbal instruction and demonstration regarding donning, doffing, wear, care, proper fit, function, purpose, cleaning, and use of shoes and insoles ' and in all related precautions and risks and benefits regarding shoes and insoles. Patient / Caregiver was instructed to wear properly fitting socks with shoes at all times. Patient was also provided with verbal instruction regarding how to report any failures or malfunctions of shoes or inserts, and necessary follow up care. Patient / Caregiver was also instructed to contact physician regarding change in status that may affect function of shoes and inserts.   Patient / Caregiver verbalized undersatnding of instruction provided. Patient / Caregiver demonstrated independence with proper donning and doffing of shoes and inserts.  PLAN Patient to follow up as needed. Plan of care was discussed with and agreed upon by patient and/or caregiver. All questions were answered and  concerns addressed.

## 2021-06-17 ENCOUNTER — Other Ambulatory Visit: Payer: Self-pay

## 2021-06-17 ENCOUNTER — Ambulatory Visit (HOSPITAL_COMMUNITY): Payer: Medicare Other | Attending: Cardiology

## 2021-06-17 DIAGNOSIS — E119 Type 2 diabetes mellitus without complications: Secondary | ICD-10-CM | POA: Diagnosis not present

## 2021-06-17 DIAGNOSIS — C220 Liver cell carcinoma: Secondary | ICD-10-CM | POA: Insufficient documentation

## 2021-06-17 DIAGNOSIS — I10 Essential (primary) hypertension: Secondary | ICD-10-CM | POA: Diagnosis not present

## 2021-06-17 DIAGNOSIS — R011 Cardiac murmur, unspecified: Secondary | ICD-10-CM

## 2021-06-17 DIAGNOSIS — Z952 Presence of prosthetic heart valve: Secondary | ICD-10-CM | POA: Insufficient documentation

## 2021-06-17 DIAGNOSIS — I2510 Atherosclerotic heart disease of native coronary artery without angina pectoris: Secondary | ICD-10-CM | POA: Insufficient documentation

## 2021-06-17 DIAGNOSIS — I2583 Coronary atherosclerosis due to lipid rich plaque: Secondary | ICD-10-CM | POA: Diagnosis not present

## 2021-06-17 DIAGNOSIS — E7800 Pure hypercholesterolemia, unspecified: Secondary | ICD-10-CM | POA: Insufficient documentation

## 2021-06-17 LAB — ECHOCARDIOGRAM COMPLETE
AV Mean grad: 13.2 mmHg
AV Peak grad: 22.8 mmHg
Ao pk vel: 2.39 m/s
Area-P 1/2: 1.84 cm2
S' Lateral: 2.3 cm

## 2021-07-13 DIAGNOSIS — L089 Local infection of the skin and subcutaneous tissue, unspecified: Secondary | ICD-10-CM | POA: Diagnosis not present

## 2021-07-13 DIAGNOSIS — Z79899 Other long term (current) drug therapy: Secondary | ICD-10-CM | POA: Diagnosis not present

## 2021-07-13 DIAGNOSIS — Z952 Presence of prosthetic heart valve: Secondary | ICD-10-CM | POA: Diagnosis not present

## 2021-07-13 DIAGNOSIS — Z7984 Long term (current) use of oral hypoglycemic drugs: Secondary | ICD-10-CM | POA: Diagnosis not present

## 2021-07-13 DIAGNOSIS — N401 Enlarged prostate with lower urinary tract symptoms: Secondary | ICD-10-CM | POA: Diagnosis not present

## 2021-07-13 DIAGNOSIS — I10 Essential (primary) hypertension: Secondary | ICD-10-CM | POA: Diagnosis not present

## 2021-07-13 DIAGNOSIS — E1169 Type 2 diabetes mellitus with other specified complication: Secondary | ICD-10-CM | POA: Diagnosis not present

## 2021-07-13 DIAGNOSIS — E7800 Pure hypercholesterolemia, unspecified: Secondary | ICD-10-CM | POA: Diagnosis not present

## 2021-07-13 DIAGNOSIS — L723 Sebaceous cyst: Secondary | ICD-10-CM | POA: Diagnosis not present

## 2021-08-10 ENCOUNTER — Ambulatory Visit: Payer: Medicare Other | Admitting: Podiatry

## 2021-08-10 DIAGNOSIS — H5203 Hypermetropia, bilateral: Secondary | ICD-10-CM | POA: Diagnosis not present

## 2021-08-10 DIAGNOSIS — H2513 Age-related nuclear cataract, bilateral: Secondary | ICD-10-CM | POA: Diagnosis not present

## 2021-08-11 ENCOUNTER — Encounter: Payer: Self-pay | Admitting: Podiatry

## 2021-08-11 ENCOUNTER — Ambulatory Visit (INDEPENDENT_AMBULATORY_CARE_PROVIDER_SITE_OTHER): Payer: Medicare Other | Admitting: Podiatry

## 2021-08-11 DIAGNOSIS — Q828 Other specified congenital malformations of skin: Secondary | ICD-10-CM

## 2021-08-11 DIAGNOSIS — L84 Corns and callosities: Secondary | ICD-10-CM

## 2021-08-11 DIAGNOSIS — E1142 Type 2 diabetes mellitus with diabetic polyneuropathy: Secondary | ICD-10-CM | POA: Diagnosis not present

## 2021-08-11 DIAGNOSIS — R600 Localized edema: Secondary | ICD-10-CM

## 2021-08-11 DIAGNOSIS — B351 Tinea unguium: Secondary | ICD-10-CM

## 2021-08-16 NOTE — Progress Notes (Signed)
?Subjective:  ?Patient ID: [name], male    DOB: [birth year],  MRN: [id number] ? ?[name] presents to clinic today for at risk foot care with history of diabetic neuropathy and painful porokeratotic lesion(s) bilaterally and painful mycotic toenails that limit ambulation. Painful toenails interfere with ambulation. Aggravating factors include wearing enclosed shoe gear. Pain is relieved with periodic professional debridement. Painful porokeratotic lesions are aggravated when weightbearing with and without shoegear. Pain is relieved with periodic professional debridement. ? ?Patient states blood glucose was 121 mg/dl today.  Last HgA1c was 6.3%. ? ?New problem(s):  patient has new complaint of new onset of swelling of bilateral ankles.  He notes no new episodes of trauma. Swelling occurred gradually. He is not experiencing any pain or numbness. ? ?PCP is [name], [name], MD , and last visit was July 13, 2021. ? ?Allergies  ?Allergen Reactions  ? Ezetimibe-Simvastatin   ?  Leg pains ?Restlessness at night  ? ?Other reaction(s): leg pain ?Leg pains ?Other reaction(s): leg pain ? ?Other reaction(s): leg pain  ? Metoprolol   ?  Other reaction(s): bradycardia ?Other reaction(s): bradycardia  ? Other   ?  Other reaction(s): discomfort ?Other reaction(s): bradycardia  ? Rosuvastatin   ?  Discomfort, RESTLESS, CAN'T SLEEP ?Discomfort, RESTLESS, CAN'T SLEEP ?Discomfort, RESTLESS, CAN'T SLEEP ? ?Other reaction(s): discomfort  ? ? ?Review of Systems: Negative except as noted in the HPI. ? ?Objective:  ?[name] is a pleasant 81 y.o. male in NAD. AAO X 3. ? ?Vascular Examination: ?Capillary refill time to digits immediate b/l. Palpable pedal pulses b/l LE. Pedal hair absent. No pain with calf compression b/l. Trace edema noted bilateral ankles; no warmth, no pain on palpation. No cyanosis or clubbing noted b/l LE. ? ?Dermatological Examination: ?Pedal integument with normal turgor, texture and tone BLE. No open  wounds b/l LE. No interdigital macerations noted b/l LE. Toenails 1-5 b/l elongated, discolored, dystrophic, thickened, crumbly with subungual debris and tenderness to dorsal palpation. Hyperkeratotic lesion(s) R 4th toe and 1st metatarsal head right foot.  No erythema, no edema, no drainage, no fluctuance. Porokeratotic lesion(s) submet head 2 b/l and submet head 3 right foot. No erythema, no edema, no drainage, no fluctuance. ? ?Musculoskeletal Examination: ?Muscle strength 5/5 to all lower extremity muscle groups bilaterally. HAV with bunion deformity noted b/l LE. Hammertoe deformity noted 2-5 b/l. Pes planovalgus deformity noted b/l lower extremities. Patient ambulates independent of any assistive aids. ? ?Neurological Examination: ?Protective sensation diminished with 10g monofilament b/l. Vibratory sensation intact b/l. ? ?Assessment/Plan: ?1. 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 Corns and callosities   ?3. Porokeratosis   ?4. Localized edema   ?5. Diabetic peripheral neuropathy associated with type 2 diabetes mellitus ([place])   ?  ? ?-Patient was evaluated and treated. All patient's and/or POA's questions/concerns answered on today's visit. ?-Discussed causes of pedal edema. He is taking amlodipine which can cause lower extremity swelling. I have asked him to discuss with his prescribing physician. He related understanding. ?-Toenails 1-5 b/l were debrided in length and girth with sterile nail nippers and dremel without iatrogenic bleeding.  ?-Corn(s) R 4th toe and callus(es) 1st metatarsal head right foot were pared utilizing sterile scalpel blade without incident. Total number debrided =2. ?-Painful porokeratotic lesion(s) submet head 2 b/l pared and enucleated with sterile scalpel blade without incident. Total number of lesions debrided=2. ?-Patient/POA to call should there be question/concern in the interim.  ? ?Return in about  3 months (around 11/10/2021). ? ?Marzetta Board, DPM  ?

## 2021-08-25 DIAGNOSIS — R350 Frequency of micturition: Secondary | ICD-10-CM | POA: Diagnosis not present

## 2021-08-25 DIAGNOSIS — R351 Nocturia: Secondary | ICD-10-CM | POA: Diagnosis not present

## 2021-08-25 DIAGNOSIS — N401 Enlarged prostate with lower urinary tract symptoms: Secondary | ICD-10-CM | POA: Diagnosis not present

## 2021-09-21 IMAGING — MR MR ABDOMEN WO/W CM
11 of 17 series · 28 of 48 positions shown · IV contrast (13ml Multihance)
Comparison: None.

CLINICAL DATA: History of ?liver cancer?. Prior [HOSPITAL] [HOSPITAL].
Records in [REDACTED] [REDACTED] indicate prior segment V ablation.

EXAM:
MRI ABDOMEN WITHOUT AND WITH CONTRAST
TECHNIQUE: Multiplanar multisequence MR imaging of the abdomen was performed
both before and after the administration of intravenous contrast.
CONTRAST:  13mL MULTIHANCE GADOBENATE DIMEGLUMINE 529 MG/ML IV SOLN

[Series 3: cor haste · coronal · 5.0mm · 0.68mm/px · 2 of 30 slices shown]
[im 1/30]
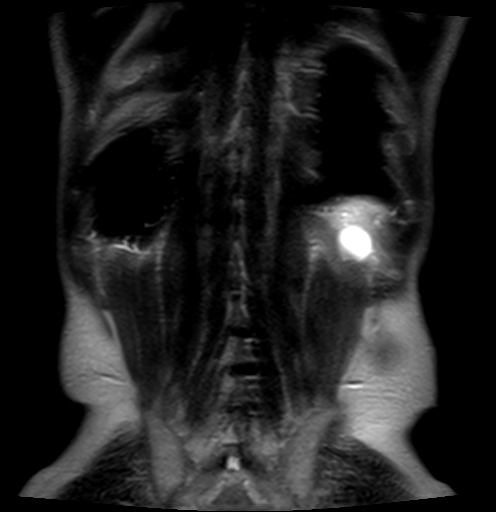
[im 30/30]
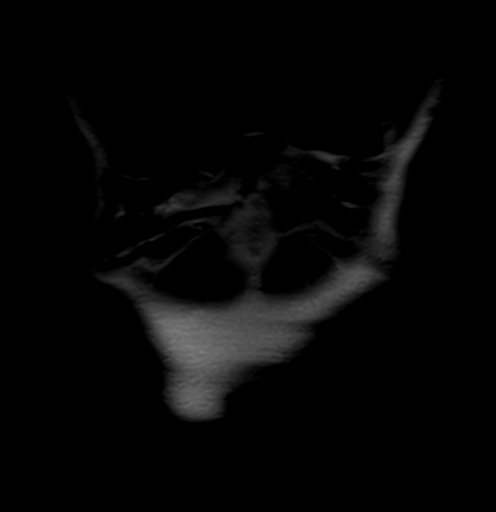

[Series 4: axial haste · axial · 6.0mm · 0.68mm/px · z∈[-96,+115]mm · 2 of 33 slices shown]
[im 1/33]
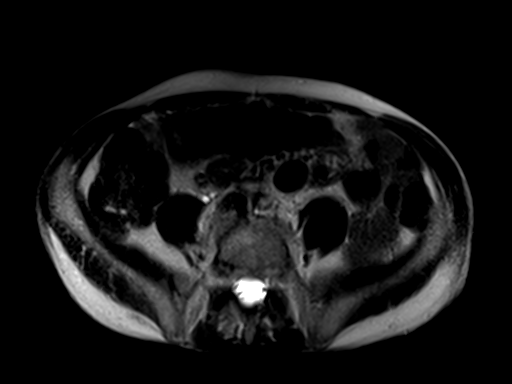
[im 33/33]
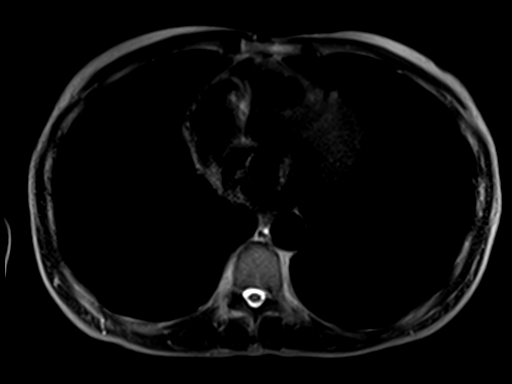

[Series 5: T1 · axial · 6.0mm · 0.68mm/px · z∈[-96,+115]mm · 4 of 66 slices shown]
[im 1/66]
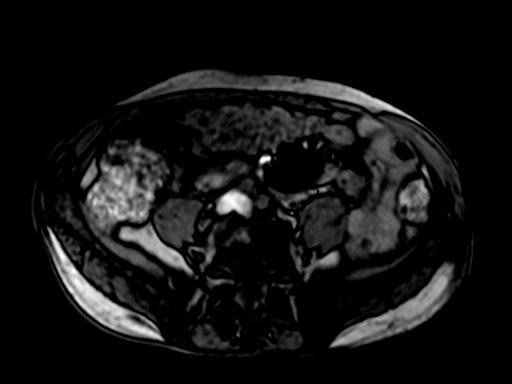
[im 22/66]
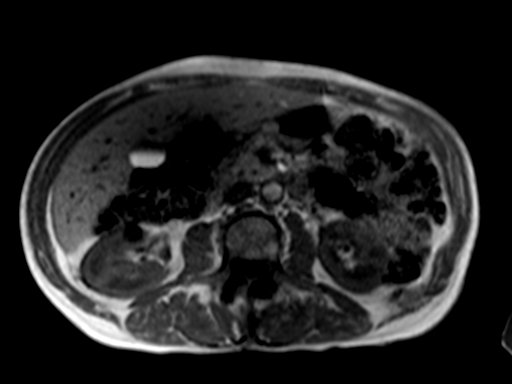
[im 44/66]
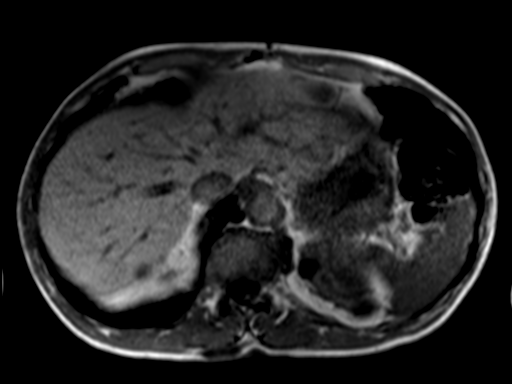
[im 66/66]
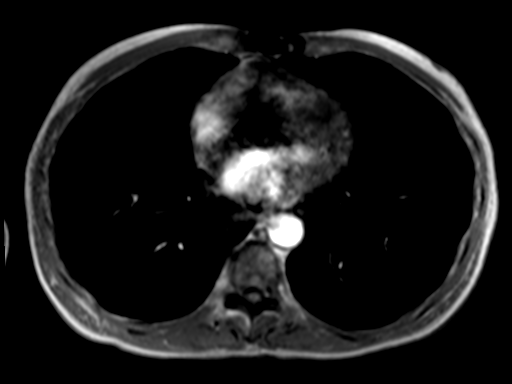

[Series 7: bSSFP · axial · 4.0mm · 0.68mm/px · z∈[-111,+129]mm · 3 of 61 slices shown]
[im 1/61]
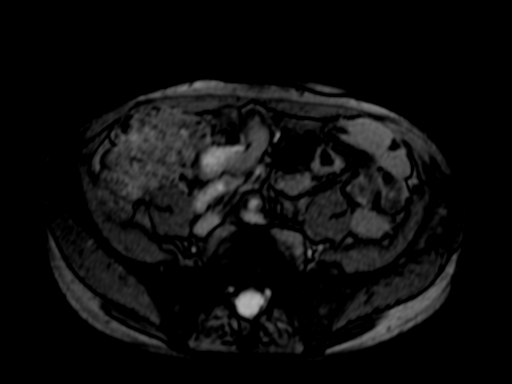
[im 31/61]
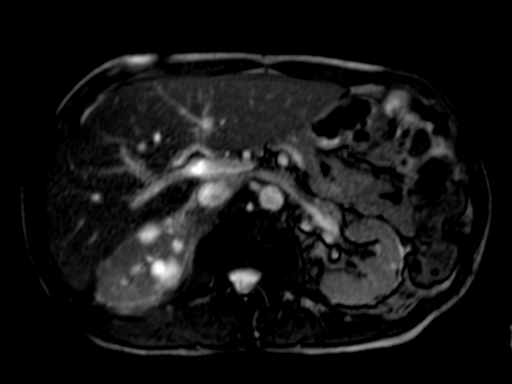
[im 61/61]
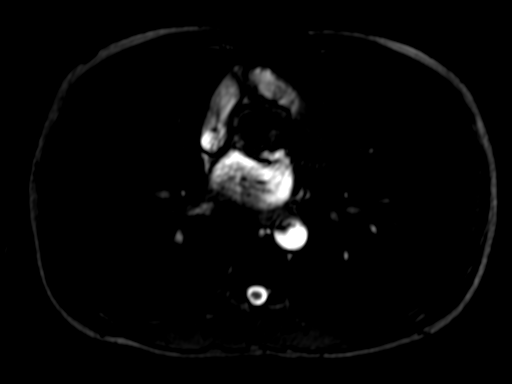

[Series 8: T2 · axial · 6.0mm · 1.09mm/px · z∈[-73,+150]mm · 2 of 32 slices shown]
[im 1/32]
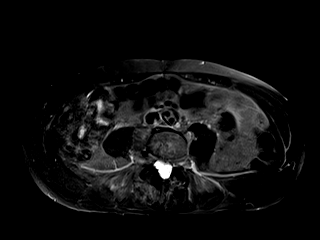
[im 32/32]
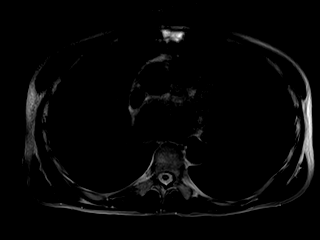

[Series 9: ep2d_diff_b50_500_800_p2_trig · axial · 6.0mm · 1.82mm/px · z∈[-94,+165]mm · 4 of 111 slices shown]
[im 1/111]
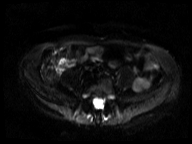
[im 37/111]
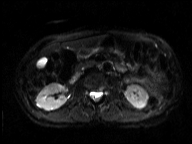
[im 74/111]
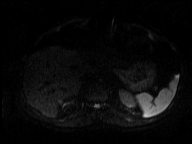
[im 111/111]
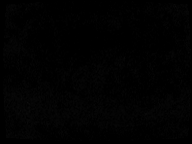

[Series 10: ep2d_diff_b50_500_800_p2_trig_adc · axial · 6.0mm · 1.82mm/px · 1 of 37 slices shown]
[im 1/37]
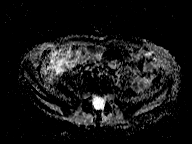

[Series 11: T1 dynamic · axial · non-contrast · 2.5mm · 0.74mm/px · z∈[-106,+112]mm · 3 of 88 slices shown]
[im 1/88]
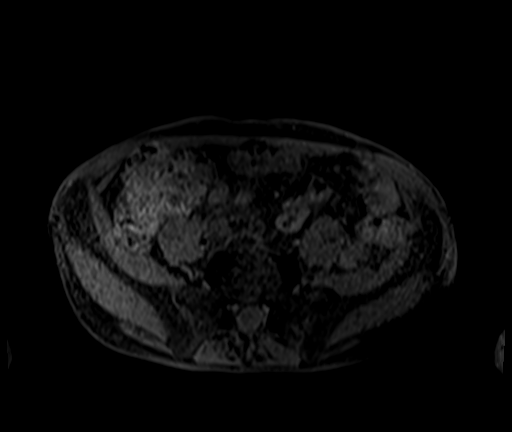
[im 44/88]
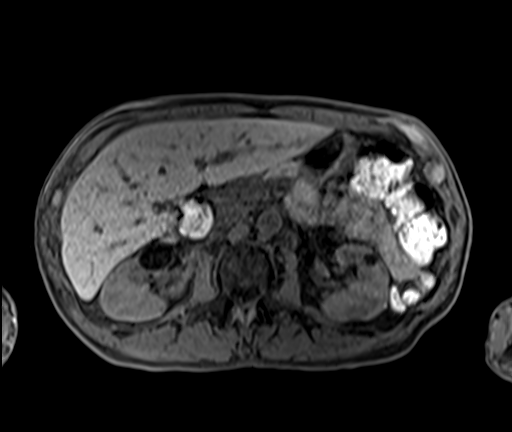
[im 88/88]
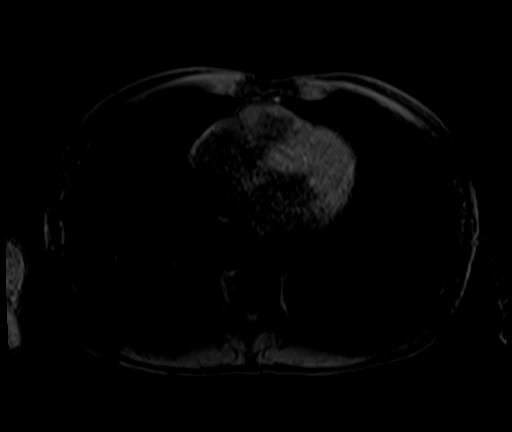

[Series 12: T1 dynamic post-contrast · axial · 2.5mm · 0.74mm/px · z∈[-106,+112]mm · 3 of 88 slices shown (1 of 3)]
[im 1/88]
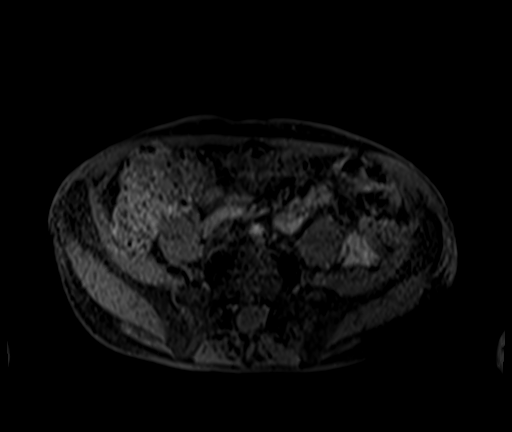
[im 44/88]
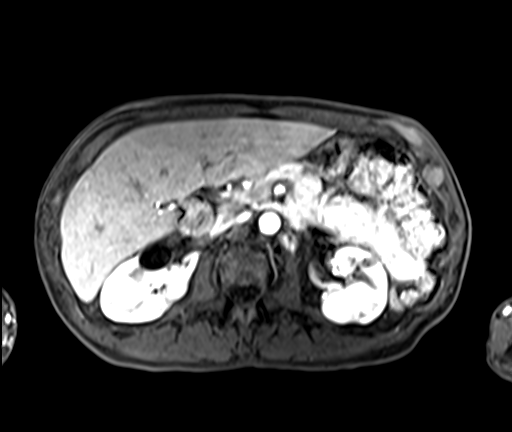
[im 88/88]
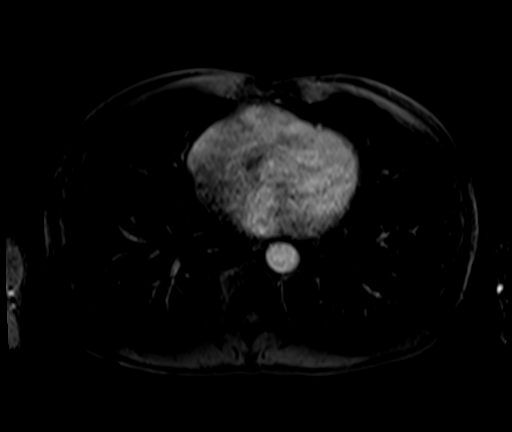

[Series 13: T1 dynamic post-contrast · axial · 2.5mm · 0.74mm/px · z∈[-106,+112]mm · 3 of 88 slices shown (2 of 3)]
[im 1/88]
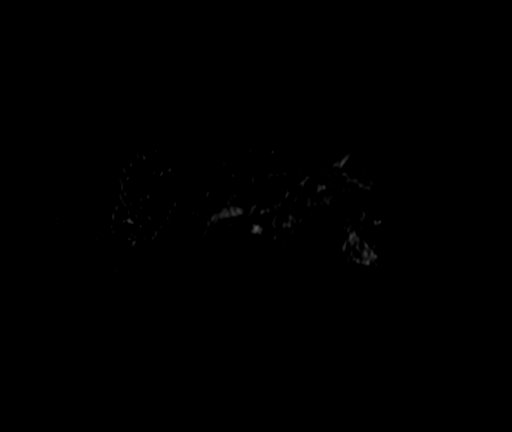
[im 44/88]
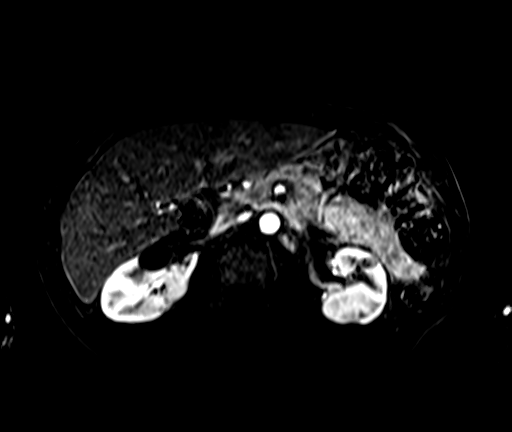
[im 88/88]
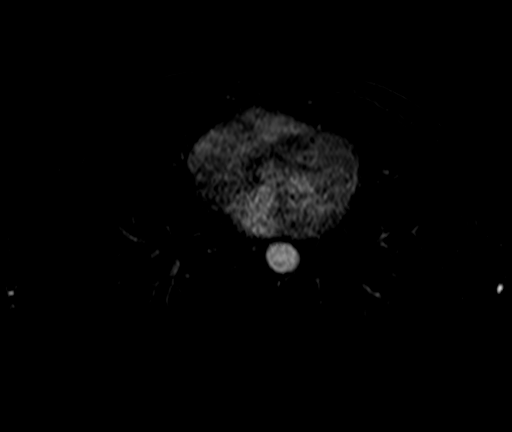

[Series 14: T1 dynamic post-contrast · axial · 2.5mm · 0.74mm/px · 1 of 88 slices shown (3 of 3)]
[im 1/88]
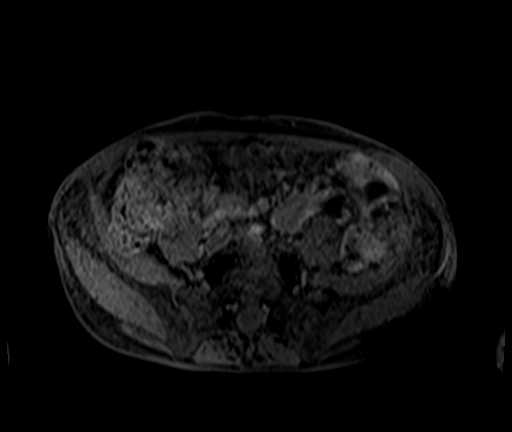

[28 of 48 positions shown; findings below may reference images not displayed]

FINDINGS: Lower chest: Unremarkable

Hepatobiliary: Liver contour does not appear frankly nodular. Liver
parenchyma is relatively homogeneous. A inferior right hepatic
lesion is identified measuring 1.8 x 1.0 cm. Presuming this
represents the ablation defect described in the report for the
outside study, this has decreased in size from 2.1 x 1.4 cm
previously.

Tiny 3-4 mm focus of arterial phase hyperenhancement identified in
the lateral segment left liver (segment III on 52/12 adjacent to the
posterior capsule). Layering sludge noted in the gallbladder. No
intrahepatic or extrahepatic biliary dilation.

Pancreas: No focal mass lesion. No dilatation of the main duct. No
intraparenchymal cyst. No peripancreatic edema.

Spleen:  No splenomegaly. No focal mass lesion.

Adrenals/Urinary Tract: No adrenal nodule or mass. Multiple cysts
are noted in the right kidney measuring up to 3.3 cm. Several cysts
are noted in the left kidney measuring up to 3.3 cm.

Stomach/Bowel: Stomach is unremarkable. No gastric wall thickening.
No evidence of outlet obstruction. Duodenum is normally positioned
as is the ligament of Treitz. No small bowel or colonic dilatation
within the visualized abdomen.

Vascular/Lymphatic: No abdominal aortic aneurysm. No abdominal
lymphadenopathy

Other:  No intraperitoneal free fluid.

Musculoskeletal: No abnormal marrow enhancement within the
visualized bony anatomy.
IMPRESSION: 1. Interval decrease in size of the inferior right hepatic lobe
lesion, now measuring up to 1.8 cm and compatible with ablation
defect. No suspicious enhancing features.
2. 3-4 mm focus of arterial phase hyperenhancement in the lateral
segment left liver. Follow-up MRI in 3 months recommended to
re-evaluate.
3. Bilateral renal cysts.

## 2021-11-17 ENCOUNTER — Ambulatory Visit (INDEPENDENT_AMBULATORY_CARE_PROVIDER_SITE_OTHER): Payer: Medicare Other | Admitting: Podiatry

## 2021-11-17 ENCOUNTER — Encounter: Payer: Self-pay | Admitting: Podiatry

## 2021-11-17 DIAGNOSIS — M79675 Pain in left toe(s): Secondary | ICD-10-CM

## 2021-11-17 DIAGNOSIS — Q828 Other specified congenital malformations of skin: Secondary | ICD-10-CM

## 2021-11-17 DIAGNOSIS — E1142 Type 2 diabetes mellitus with diabetic polyneuropathy: Secondary | ICD-10-CM | POA: Diagnosis not present

## 2021-11-17 DIAGNOSIS — L84 Corns and callosities: Secondary | ICD-10-CM

## 2021-11-17 DIAGNOSIS — M79674 Pain in right toe(s): Secondary | ICD-10-CM | POA: Diagnosis not present

## 2021-11-17 DIAGNOSIS — B351 Tinea unguium: Secondary | ICD-10-CM | POA: Diagnosis not present

## 2021-11-17 NOTE — Progress Notes (Signed)
  Subjective:  Patient ID: [name], male    DOB: [birth year],  MRN: [id number]  [name] presents to clinic today for at risk foot care with history of diabetic neuropathy and corn(s) right lower extremity, callus(es) right lower extremity, porokeratotic lesion(s) b/l lower extremities,and painful mycotic nails. Painful toenails interfere with ambulation. Aggravating factors include wearing enclosed shoe gear. Pain is relieved with periodic professional debridement. Painful corns, callus(es) and porokeratotic lesion(s) are aggravated when weightbearing with and without shoegear. Pain is relieved with periodic professional debridement.  Last A1c was 6.3%. Patient did not check blood glucose today.  New problem(s): None.   PCP is [name], [name], MD , and last visit was  July 13, 2021  Allergies  Allergen Reactions   Ezetimibe-Simvastatin     Leg pains Restlessness at night   Other reaction(s): leg pain Leg pains Other reaction(s): leg pain  Other reaction(s): leg pain   Metoprolol     Other reaction(s): bradycardia Other reaction(s): bradycardia   Other     Other reaction(s): discomfort Other reaction(s): bradycardia   Rosuvastatin     Discomfort, RESTLESS, CAN'T SLEEP Discomfort, RESTLESS, CAN'T SLEEP Discomfort, RESTLESS, CAN'T SLEEP  Other reaction(s): discomfort    Review of Systems: Negative except as noted in the HPI.  Objective: No changes noted in today's physical examination. [name] is a pleasant 81 y.o. male in NAD. AAO X 3.  Vascular Examination: Capillary refill time to digits immediate b/l. Palpable pedal pulses b/l LE. Pedal hair absent. No pain with calf compression b/l. Trace edema noted bilateral ankles; no warmth, no pain on palpation. No cyanosis or clubbing noted b/l LE.  Dermatological Examination: Pedal integument with normal turgor, texture and tone BLE. No open wounds b/l LE. No interdigital macerations noted b/l LE. Toenails 1-5  b/l elongated, discolored, dystrophic, thickened, crumbly with subungual debris and tenderness to dorsal palpation. Hyperkeratotic lesion(s) R 4th toe and 1st metatarsal head right foot.  No erythema, no edema, no drainage, no fluctuance. Porokeratotic lesion(s) submet head 2 b/l and submet head 3 right foot. No erythema, no edema, no drainage, no fluctuance.  Musculoskeletal Examination: Muscle strength 5/5 to all lower extremity muscle groups bilaterally. HAV with bunion deformity noted b/l LE. Hammertoe deformity noted 2-5 b/l. Pes planovalgus deformity noted b/l lower extremities. Patient ambulates independent of any assistive aids.  Neurological Examination: Protective sensation diminished with 10g monofilament b/l. Vibratory sensation intact b/l.  Assessment/Plan: 1. Pain due to onychomycosis of toenails of both feet   2. Porokeratosis   3. Corns and callosities   4. Diabetic peripheral neuropathy associated with type 2 diabetes mellitus ([place])     -Consent given for treatment as described below: -Continue foot and shoe inspections daily. Monitor blood glucose per PCP/Endocrinologist's recommendations. -Corn(s) R 4th toe and callus(es) 1st metatarsal head right lower extremity were pared utilizing sterile scalpel blade without incident. Total number debrided =2. -Porokeratotic lesion(s) submet head 2 b/l pared and enucleated with sterile scalpel blade without incident. Total number of lesions debrided=2. -Patient/POA to call should there be question/concern in the interim.   Return in about 3 months (around 02/17/2022).  Marzetta Board, DPM

## 2022-01-31 DIAGNOSIS — Z23 Encounter for immunization: Secondary | ICD-10-CM | POA: Diagnosis not present

## 2022-01-31 DIAGNOSIS — E7800 Pure hypercholesterolemia, unspecified: Secondary | ICD-10-CM | POA: Diagnosis not present

## 2022-01-31 DIAGNOSIS — N401 Enlarged prostate with lower urinary tract symptoms: Secondary | ICD-10-CM | POA: Diagnosis not present

## 2022-01-31 DIAGNOSIS — Z952 Presence of prosthetic heart valve: Secondary | ICD-10-CM | POA: Diagnosis not present

## 2022-01-31 DIAGNOSIS — I10 Essential (primary) hypertension: Secondary | ICD-10-CM | POA: Diagnosis not present

## 2022-01-31 DIAGNOSIS — Z0001 Encounter for general adult medical examination with abnormal findings: Secondary | ICD-10-CM | POA: Diagnosis not present

## 2022-01-31 DIAGNOSIS — E1169 Type 2 diabetes mellitus with other specified complication: Secondary | ICD-10-CM | POA: Diagnosis not present

## 2022-01-31 DIAGNOSIS — H6123 Impacted cerumen, bilateral: Secondary | ICD-10-CM | POA: Diagnosis not present

## 2022-01-31 DIAGNOSIS — Z79899 Other long term (current) drug therapy: Secondary | ICD-10-CM | POA: Diagnosis not present

## 2022-02-19 DIAGNOSIS — Z23 Encounter for immunization: Secondary | ICD-10-CM | POA: Diagnosis not present

## 2022-02-22 DIAGNOSIS — N401 Enlarged prostate with lower urinary tract symptoms: Secondary | ICD-10-CM | POA: Diagnosis not present

## 2022-02-22 DIAGNOSIS — R350 Frequency of micturition: Secondary | ICD-10-CM | POA: Diagnosis not present

## 2022-02-23 ENCOUNTER — Encounter: Payer: Self-pay | Admitting: Podiatry

## 2022-02-23 ENCOUNTER — Ambulatory Visit (INDEPENDENT_AMBULATORY_CARE_PROVIDER_SITE_OTHER): Payer: Medicare Other | Admitting: Podiatry

## 2022-02-23 DIAGNOSIS — L84 Corns and callosities: Secondary | ICD-10-CM

## 2022-02-23 DIAGNOSIS — E1142 Type 2 diabetes mellitus with diabetic polyneuropathy: Secondary | ICD-10-CM | POA: Diagnosis not present

## 2022-02-23 DIAGNOSIS — B351 Tinea unguium: Secondary | ICD-10-CM

## 2022-02-23 DIAGNOSIS — M79674 Pain in right toe(s): Secondary | ICD-10-CM

## 2022-02-23 DIAGNOSIS — M79675 Pain in left toe(s): Secondary | ICD-10-CM | POA: Diagnosis not present

## 2022-02-23 DIAGNOSIS — Q828 Other specified congenital malformations of skin: Secondary | ICD-10-CM

## 2022-02-23 NOTE — Progress Notes (Signed)
  Subjective:  Patient ID: [name], male    DOB: 06/22/1940,  MRN: [id number]  [name] presents to clinic today for:  Chief Complaint  Patient presents with   Nail Problem    Diabetic foot care BS-138 A1C-6.3 PCP-[name] PCP VST9/2023   New problem(s): None.   PCP is [name], [name], MD , and last visit was  July 13, 2021.  Allergies  Allergen Reactions   Ezetimibe-Simvastatin     Leg pains Restlessness at night   Other reaction(s): leg pain Leg pains Other reaction(s): leg pain  Other reaction(s): leg pain   Metoprolol     Other reaction(s): bradycardia Other reaction(s): bradycardia   Other     Other reaction(s): discomfort Other reaction(s): bradycardia   Rosuvastatin     Discomfort, RESTLESS, CAN'T SLEEP Discomfort, RESTLESS, CAN'T SLEEP Discomfort, RESTLESS, CAN'T SLEEP  Other reaction(s): discomfort    Review of Systems: Negative except as noted in the HPI.  Objective:   [name] is a pleasant 81 y.o. male WD, WN in NAD. AAO x 3. Vascular Examination: Capillary refill time to digits immediate b/l. Palpable pedal pulses b/l LE. Pedal hair absent. No pain with calf compression b/l. Trace edema noted bilateral ankles; no warmth, no pain on palpation. No cyanosis or clubbing noted b/l LE.  Dermatological Examination: Pedal integument with normal turgor, texture and tone BLE. No open wounds b/l LE. No interdigital macerations noted b/l LE.   Toenails 1-5 b/l elongated, discolored, dystrophic, thickened, crumbly with subungual debris and tenderness to dorsal palpation.   Hyperkeratotic lesion(s) R 4th toe and 1st metatarsal head right foot.  No erythema, no edema, no drainage, no fluctuance.   Porokeratotic lesion(s) submet head 2 b/l.  No erythema, no edema, no drainage, no fluctuance.  Musculoskeletal Examination: Muscle strength 5/5 to all lower extremity muscle groups bilaterally. HAV with bunion deformity noted b/l LE. Hammertoe  deformity noted 2-5 b/l. Pes planovalgus deformity noted b/l lower extremities. Patient ambulates independent of any assistive aids.  Neurological Examination: Protective sensation diminished with 10g monofilament b/l. Vibratory sensation intact b/l.  Assessment/Plan: 1. Pain due to onychomycosis of toenails of both feet   2. Porokeratosis   3. Corns and callosities   4. Diabetic peripheral neuropathy associated with type 2 diabetes mellitus ([place])     No orders of the defined types were placed in this encounter.   -Patient was evaluated and treated. All patient's and/or POA's questions/concerns answered on today's visit. -Examined patient. -Continue diabetic foot care principles: inspect feet daily, monitor glucose as recommended by PCP and/or Endocrinologist, and follow prescribed diet per PCP, Endocrinologist and/or dietician. -Mycotic toenails 1-5 bilaterally were debrided in length and girth with sterile nail nippers and dremel without incident. -Corn(s) right fourth digit and callus(es) 1st metatarsal head right lower extremity were pared utilizing sterile scalpel blade without incident. Total number debrided =2. -Porokeratotic lesion(s) submet head 2 b/l pared and enucleated with sterile currette without incident. Total number of lesions debrided=2. -Patient/POA to call should there be question/concern in the interim.   Return in about 3 months (around 05/26/2022).  Marzetta Board, DPM

## 2022-04-28 ENCOUNTER — Ambulatory Visit
Admission: RE | Admit: 2022-04-28 | Discharge: 2022-04-28 | Disposition: A | Discharge status: Home / Self Care | Payer: Medicare Other | Source: Ambulatory Visit | Attending: Family Medicine | Admitting: Family Medicine

## 2022-04-28 ENCOUNTER — Other Ambulatory Visit: Payer: Self-pay | Admitting: Family Medicine

## 2022-04-28 DIAGNOSIS — R051 Acute cough: Secondary | ICD-10-CM

## 2022-04-28 DIAGNOSIS — J209 Acute bronchitis, unspecified: Secondary | ICD-10-CM | POA: Diagnosis not present

## 2022-04-28 DIAGNOSIS — R059 Cough, unspecified: Secondary | ICD-10-CM | POA: Diagnosis not present

## 2022-05-04 ENCOUNTER — Other Ambulatory Visit: Payer: Self-pay | Admitting: Family Medicine

## 2022-05-04 DIAGNOSIS — R918 Other nonspecific abnormal finding of lung field: Secondary | ICD-10-CM

## 2022-05-05 ENCOUNTER — Ambulatory Visit
Admission: RE | Admit: 2022-05-05 | Discharge: 2022-05-05 | Disposition: A | Discharge status: Home / Self Care | Payer: Medicare Other | Source: Ambulatory Visit | Attending: Family Medicine | Admitting: Family Medicine

## 2022-05-05 DIAGNOSIS — I700 Atherosclerosis of aorta: Secondary | ICD-10-CM | POA: Diagnosis not present

## 2022-05-05 DIAGNOSIS — J9859 Other diseases of mediastinum, not elsewhere classified: Secondary | ICD-10-CM | POA: Diagnosis not present

## 2022-05-05 DIAGNOSIS — R918 Other nonspecific abnormal finding of lung field: Secondary | ICD-10-CM

## 2022-05-05 DIAGNOSIS — J432 Centrilobular emphysema: Secondary | ICD-10-CM | POA: Diagnosis not present

## 2022-05-05 MED ORDER — IOPAMIDOL (ISOVUE-300) INJECTION 61%
75.0000 mL | Freq: Once | INTRAVENOUS | Status: AC | PRN
  Administered 2022-05-05: 75 mL via INTRAVENOUS

## 2022-05-11 NOTE — Progress Notes (Unsigned)
[place] [phone]   [phone]  CONSULT NOTE  REFERRING PHYSICIAN: Dr. Dorthy Cooler  REASON FOR CONSULTATION:  Suspicious Lung Cancer   HPI [name] is a 82 y.o. male with past medical history significant for diabetes, hypertension, coronary artery disease, hepatitis, hepatocellular carcinoma, iron deficiency anemia, and hyperlipidemia is referred to the clinic for suspicious lung cancer.  The patient had an appointment with his PCP on 04/28/2022 for the chief complaint of congestion, and cough for several weeks.  He particularly coughs at nighttime.  Had a chest x-ray performed the same day which showed a 3.7 x 5.6 x 3.1 cm right paratracheal density with mass effect on the trachea which is concerning for a mass lesion.  There is no other acute cardiopulmonary findings.  The patient subsequently had a CT scan of the chest on 05/05/2022 to further characterize this area which showed a large centrally necrotic mass medially at the right lung apex highly suspicious for primary bronchogenic carcinoma.  This abuts the superior mediastinum and T3 and T4 vertebral bodies without definite mediastinal invasion or bone destruction.  There is scattered small mediastinal hilar, and supraclavicular lymph nodes which are nonspecific.  There is no evidence of distant metastatic disease.  There is a small nonspecific support right upper lobe pulmonary nodule.  The patient was referred to the clinic for further evaluation and management of this finding.       1. Large centrally necrotic mass medially at the right lung apex, highly suspicious for primary bronchogenic carcinoma. This abuts the superior mediastinum and T3 and T4 vertebral bodies, but causes no definite mediastinal invasion or bone destruction. 2. Scattered small mediastinal, hilar and supraclavicular lymph nodes, nonspecific. 3. No evidence of distant metastatic disease. Small  nonspecific separate right upper lobe pulmonary nodule. PET-CT may be helpful for staging. 4. Aortic At HPI  Past Medical History:  Diagnosis Date   Acute epididymo-orchitis    Aortic stenosis    s/p bioprosthetic AVR (Dr. Cyndia Bent) 11/2013   Arthritis    Diabetes mellitus without complication [facility])    ED (erectile dysfunction)    Essential hypertension, benign    Heart murmur    Hep C w/o coma, chronic ([name])    History of blood transfusion    History of DVT of lower extremity    Hx of echocardiogram    Echo (8/15):  Severe LVH, EF 60-65%, no RWMA, Gr 3 DD, AVR ok (mean 21 mmHg), MAC, mild LAE, mild to mod RAE   Hypercholesterolemia     Past Surgical History:  Procedure Laterality Date   AORTIC VALVE REPLACEMENT N/A 11/19/2013   Procedure: AORTIC VALVE REPLACEMENT (AVR);  Surgeon: Gaye Pollack, MD;  Location: [facility];  Service: Open Heart Surgery;  Laterality: N/A;   BUNIONECTOMY Bilateral 2008   CARDIAC CATHETERIZATION  10/01/13   COLONOSCOPY  01/2010   internal hemorrhoids.  Dr Deatra Ina   ESOPHAGOGASTRODUODENOSCOPY N/A 04/24/2013   Procedure: ESOPHAGOGASTRODUODENOSCOPY (EGD);  Surgeon: Jerene Bears, MD;  Location: [facility] ENDOSCOPY;  Service: Endoscopy;  Laterality: N/A;   INGUINAL HERNIA REPAIR Right    INTRAOPERATIVE TRANSESOPHAGEAL ECHOCARDIOGRAM N/A 11/19/2013   Procedure: INTRAOPERATIVE TRANSESOPHAGEAL ECHOCARDIOGRAM;  Surgeon: Gaye Pollack, MD;  Location: [facility] OR;  Service: Open Heart Surgery;  Laterality: N/A;   KNEE ARTHROSCOPY Left 05/2009   Dr Collier Salina   LAMINECTOMY  06/2005   Dr Joya Salm. multiple lumbar level laminectomies.    LEFT AND RIGHT HEART CATHETERIZATION WITH CORONARY  ANGIOGRAM N/A 10/01/2013   Procedure: LEFT AND RIGHT HEART CATHETERIZATION WITH CORONARY ANGIOGRAM;  Surgeon: Candee Furbish, MD;  Location: [facility] CATH LAB;  Service: Cardiovascular;  Laterality: N/A;   TEE WITHOUT CARDIOVERSION  11/2003   Aoritic valve sclerosis.    TONSILLECTOMY      Family History   Problem Relation Age of Onset   Diabetes Father    Stroke Father    Diabetes Mother    Stroke Mother    Hypertension Unknown        family history   Heart attack Neg Hx     Social History Social History   Tobacco Use   Smoking status: Former    Types: Cigarettes    Quit date: 09/26/1986    Years since quitting: 35.6   Smokeless tobacco: Never  Vaping Use   Vaping Use: Never used  Substance Use Topics   Alcohol use: No   Drug use: No    Allergies  Allergen Reactions   Ezetimibe-Simvastatin     Leg pains Restlessness at night   Other reaction(s): leg pain Leg pains Other reaction(s): leg pain  Other reaction(s): leg pain   Metoprolol     Other reaction(s): bradycardia Other reaction(s): bradycardia   Other     Other reaction(s): discomfort Other reaction(s): bradycardia   Rosuvastatin     Discomfort, RESTLESS, CAN'T SLEEP Discomfort, RESTLESS, CAN'T SLEEP Discomfort, RESTLESS, CAN'T SLEEP  Other reaction(s): discomfort    Current Outpatient Medications  Medication Sig Dispense Refill   acetaminophen (TYLENOL) 500 MG tablet Take 500 mg by mouth every 6 (six) hours as needed for mild pain or moderate pain.     amLODipine (NORVASC) 10 MG tablet 1 tablet     amoxicillin (AMOXIL) 500 MG capsule TK 4 CS PO 1 HOUR PRIOR TO DENTAL APPOINTMENT     aspirin EC 81 MG EC tablet Take 1 tablet (81 mg total) by mouth daily.     atorvastatin (LIPITOR) 20 MG tablet Take 1 tablet by mouth daily.     ferrous gluconate (FERGON) 324 MG tablet Take 1 tablet (324 mg total) by mouth daily with breakfast. 30 tablet 3   Ferrous Sulfate (IRON) 325 (65 Fe) MG TABS 1 tablet     FLUZONE HIGH-DOSE 0.5 ML injection Inject as directed once.  0   glucose blood test strip      glucose blood test strip as directed DX 250     lisinopril-hydrochlorothiazide (PRINZIDE,ZESTORETIC) 20-25 MG per tablet Take 1 tablet by mouth daily.     metFORMIN (GLUCOPHAGE) 1000 MG tablet Take 1 tablet by mouth  2 (two) times daily with a meal.     mupirocin ointment (BACTROBAN) 2 % APPLY TO LEFT  SECONDTOE ONCE DAILY WITH DRESSING     SF 5000 PLUS 1.1 % CREA dental cream BRUSH TEETH HS WITH TOOTHPASTE THEN EXPECTORATE  2   tamsulosin (FLOMAX) 0.4 MG CAPS capsule Take by mouth.     No current facility-administered medications for this visit.    REVIEW OF SYSTEMS:   Review of Systems  Constitutional: Negative for appetite change, chills, fatigue, fever and unexpected weight change.  HENT:   Negative for mouth sores, nosebleeds, sore throat and trouble swallowing.   Eyes: Negative for eye problems and icterus.  Respiratory: Negative for cough, hemoptysis, shortness of breath and wheezing.   Cardiovascular: Negative for chest pain and leg swelling.  Gastrointestinal: Negative for abdominal pain, constipation, diarrhea, nausea and vomiting.  Genitourinary: Negative for bladder incontinence,  difficulty urinating, dysuria, frequency and hematuria.   Musculoskeletal: Negative for back pain, gait problem, neck pain and neck stiffness.  Skin: Negative for itching and rash.  Neurological: Negative for dizziness, extremity weakness, gait problem, headaches, light-headedness and seizures.  Hematological: Negative for adenopathy. Does not bruise/bleed easily.  Psychiatric/Behavioral: Negative for confusion, depression and sleep disturbance. The patient is not nervous/anxious.     PHYSICAL EXAMINATION:  There were no vitals taken for this visit.  ECOG PERFORMANCE STATUS: {CHL ONC ECOG [id number]  Physical Exam  Constitutional: Oriented to person, place, and time and well-developed, well-nourished, and in no distress. No distress.  HENT:  Head: Normocephalic and atraumatic.  Mouth/Throat: Oropharynx is clear and moist. No oropharyngeal exudate.  Eyes: Conjunctivae are normal. Right eye exhibits no discharge. Left eye exhibits no discharge. No scleral icterus.  Neck: Normal range of motion. Neck  supple.  Cardiovascular: Normal rate, regular rhythm, normal heart sounds and intact distal pulses.   Pulmonary/Chest: Effort normal and breath sounds normal. No respiratory distress. No wheezes. No rales.  Abdominal: Soft. Bowel sounds are normal. Exhibits no distension and no mass. There is no tenderness.  Musculoskeletal: Normal range of motion. Exhibits no edema.  Lymphadenopathy:    No cervical adenopathy.  Neurological: Alert and oriented to person, place, and time. Exhibits normal muscle tone. Gait normal. Coordination normal.  Skin: Skin is warm and dry. No rash noted. Not diaphoretic. No erythema. No pallor.  Psychiatric: Mood, memory and judgment normal.  Vitals reviewed.  LABORATORY DATA: Lab Results  Component Value Date   WBC 6.2 11/22/2013   HGB 8.4 (L) 11/22/2013   HCT 24.4 (L) 11/22/2013   MCV 89.4 11/22/2013   PLT 94 (L) 11/22/2013      Chemistry      Component Value Date/Time   NA 135 (L) 11/22/2013 0350   K 4.3 11/22/2013 0350   CL 99 11/22/2013 0350   CO2 24 11/22/2013 0350   BUN 24 (H) 11/22/2013 0350   CREATININE 0.94 11/22/2013 0350      Component Value Date/Time   CALCIUM 8.4 11/22/2013 0350   ALKPHOS 70 11/14/2013 1305   AST 82 (H) 11/14/2013 1305   ALT 102 (H) 11/14/2013 1305   BILITOT 0.3 11/14/2013 1305       RADIOGRAPHIC STUDIES: CT CHEST W CONTRAST  Result Date: 05/05/2022 CLINICAL DATA:  Right paratracheal mass on chest radiographs. Former smoker with cough. History of hepatocellular carcinoma. * Tracking Code: BO * EXAM: CT CHEST WITH CONTRAST TECHNIQUE: Multidetector CT imaging of the chest was performed during intravenous contrast administration. RADIATION DOSE REDUCTION: This exam was performed according to the departmental dose-optimization program which includes automated exposure control, adjustment of the mA and/or kV according to patient size and/or use of iterative reconstruction technique. CONTRAST:  70m ISOVUE-300 IOPAMIDOL  (ISOVUE-300) INJECTION 61% COMPARISON:  Radiographs 04/28/2022 and 12/25/2013. Abdominal MRI 10/09/2020. FINDINGS: Cardiovascular: No acute vascular findings. There is atherosclerosis of the aorta, great vessels and coronary arteries status post median sternotomy, CABG and aortic valve replacement. The heart size is normal. There is no pericardial effusion. Mediastinum/Nodes: Large right paratracheal mass seen on recent radiographs is further described below. There are scattered small mediastinal, hilar and supraclavicular lymph nodes, measuring up to 12 mm in the precarinal station (image 68/2). No axillary adenopathy. The thyroid gland, trachea and esophagus demonstrate no significant findings. Lungs/Pleura: No pleural effusion or pneumothorax. Moderate centrilobular and paraseptal emphysema with scattered subpleural reticulation in both lungs. Corresponding with the recent radiographic  finding is a large centrally necrotic mass medially at the right lung apex, abutting the superior mediastinum. No definite tracheal or esophageal invasion. This measures 5.8 x 5.0 cm transverse on image 43/2 and 5.9 cm on coronal image 69/3. Finding is highly suspicious for primary bronchogenic carcinoma. No other highly suspicious pulmonary nodules are identified. There is mild patchy airspace disease surrounding the medial right upper lobe mass. There is a 4 mm nodule anteriorly in the right upper lobe on image 58/5. Upper abdomen: The visualized upper abdomen demonstrates no acute or suspicious findings. No suspicious lesions are identified within the visualized liver. There are simple appearing renal cysts bilaterally for which no follow-up imaging is recommended. Musculoskeletal/Chest wall: There is no chest wall mass or suspicious osseous finding. Healed median sternotomy. The medial right upper lobe lung mass abuts the T3 and T4 vertebral bodies on the right, but causes no definite bone erosion. IMPRESSION: 1. Large  centrally necrotic mass medially at the right lung apex, highly suspicious for primary bronchogenic carcinoma. This abuts the superior mediastinum and T3 and T4 vertebral bodies, but causes no definite mediastinal invasion or bone destruction. 2. Scattered small mediastinal, hilar and supraclavicular lymph nodes, nonspecific. 3. No evidence of distant metastatic disease. Small nonspecific separate right upper lobe pulmonary nodule. PET-CT may be helpful for staging. 4. Aortic Atherosclerosis (ICD10-I70.0) and Emphysema (ICD10-J43.9). Electronically Signed   By: Richardean Sale M.D.   On: 05/05/2022 10:29   DG Chest 2 View  Result Date: 05/01/2022 CLINICAL DATA:  Acute cough.  Former smoker. EXAM: CHEST - 2 VIEW COMPARISON:  Two-view chest x-ray 12/25/2013 FINDINGS: Heart size is normal. A right paratracheal density measures 3.7 x 5.6 x 3.1 cm (AP x CC x TR). The trachea is slightly displaced to the left. No other nodule or mass lesion is present. No focal airspace disease is present. Aortic valve replacement noted. Degenerative changes have progressed in the right shoulder. Mild curvature of the thoracic spine is stable. Visualized soft tissues and bony thorax are otherwise unremarkable. IMPRESSION: 1. 3.7 x 5.6 x 3.1 cm right paratracheal density with mass effect on the trachea. This is concerning for a mass lesion. CT of the chest with contrast is recommended for further evaluation. 2. No other acute cardiopulmonary disease. These results will be called to the ordering clinician or representative by the Radiologist Assistant, and communication documented in the PACS or [employer]. Electronically Signed   By: San Morelle M.D.   On: 05/01/2022 14:10    ASSESSMENT: This is a very pleasant 82 year old ***male referred to the clinic for suspicious bronchogenic carcinoma pending further staging workup.  The patient was found to have a large centrally necrotic right apical lung mass abutting the  mediastinum and T3 and T4 vertebral bodies without any evidence of definite mediastinal invasion or bone destruction.  There is also nonspecific scattered small mediastinal, hilar, and supraclavicular lymph nodes.  Found in December 2023.  The patient was seen with Dr. Julien Nordmann today.  Dr. Julien Nordmann had a lengthy discussion with the patient today about his current condition and further workup.  Discussed that tissue confirmation is needed.  Discussed the approaches for biopsy including bronchoscopy versus percutaneous biopsy.  Dr. Julien Nordmann recommends ***  We also will require a PET scan to complete the staging workup and a brain MRI.   See the patient back for follow-up visit in 3 weeks to allow time for tissue biopsy and to complete the staging workup.  We will then see  him back at that time for more detailed discussion about his current condition and recommended treatment options based on the studies.  Will need to send moleculars once biopsy is obtained   The patient voices understanding of current disease status and treatment options and is in agreement with the current care plan.  All questions were answered. The patient knows to call the clinic with any problems, questions or concerns. We can certainly see the patient much sooner if necessary.  Thank you so much for allowing me to participate in the care of [name]. I will continue to follow up the patient with you and assist in his care.  I spent {CHL ONC TIME VISIT - KSMMO:[id number]} counseling the patient face to face. The total time spent in the appointment was {CHL ONC TIME VISIT - NPHQN:[id number]}.  Disclaimer: This note was dictated with voice recognition software. Similar sounding words can inadvertently be transcribed and may not be corrected upon review.   [name] L [name] May 11, 2022, 8:51 AM

## 2022-05-12 ENCOUNTER — Inpatient Hospital Stay: Payer: Medicare Other

## 2022-05-12 ENCOUNTER — Other Ambulatory Visit: Payer: Self-pay | Admitting: Physician Assistant

## 2022-05-12 ENCOUNTER — Inpatient Hospital Stay: Payer: Medicare Other | Attending: Physician Assistant | Admitting: Physician Assistant

## 2022-05-12 VITALS — BP 148/67 | HR 80 | Temp 99.0°F | Resp 16 | Wt 134.0 lb

## 2022-05-12 DIAGNOSIS — R051 Acute cough: Secondary | ICD-10-CM | POA: Insufficient documentation

## 2022-05-12 DIAGNOSIS — Z8505 Personal history of malignant neoplasm of liver: Secondary | ICD-10-CM | POA: Diagnosis not present

## 2022-05-12 DIAGNOSIS — C3490 Malignant neoplasm of unspecified part of unspecified bronchus or lung: Secondary | ICD-10-CM

## 2022-05-12 DIAGNOSIS — D500 Iron deficiency anemia secondary to blood loss (chronic): Secondary | ICD-10-CM | POA: Diagnosis not present

## 2022-05-12 DIAGNOSIS — R918 Other nonspecific abnormal finding of lung field: Secondary | ICD-10-CM

## 2022-05-12 DIAGNOSIS — D649 Anemia, unspecified: Secondary | ICD-10-CM | POA: Diagnosis not present

## 2022-05-12 LAB — CMP (CANCER CENTER ONLY)
ALT: 6 U/L (ref 0–44)
AST: 14 U/L — ABNORMAL LOW (ref 15–41)
Albumin: 3.6 g/dL (ref 3.5–5.0)
Alkaline Phosphatase: 52 U/L (ref 38–126)
Anion gap: 9 (ref 5–15)
BUN: 23 mg/dL (ref 8–23)
CO2: 25 mmol/L (ref 22–32)
Calcium: 9.9 mg/dL (ref 8.9–10.3)
Chloride: 102 mmol/L (ref 98–111)
Creatinine: 1.06 mg/dL (ref 0.61–1.24)
GFR, Estimated: >60 mL/min (ref >60)
Glucose, Bld: 77 mg/dL (ref 70–99)
Potassium: 4.4 mmol/L (ref 3.5–5.1)
Sodium: 136 mmol/L (ref 135–145)
Total Bilirubin: 0.2 mg/dL — ABNORMAL LOW (ref 0.3–1.2)
Total Protein: 7.9 g/dL (ref 6.5–8.1)

## 2022-05-12 LAB — CBC WITH DIFFERENTIAL (CANCER CENTER ONLY)
Abs Immature Granulocytes: 0.02 10*3/uL (ref 0.00–0.07)
Basophils Absolute: 0 10*3/uL (ref 0.0–0.1)
Basophils Relative: 1 %
Eosinophils Absolute: 0.1 10*3/uL (ref 0.0–0.5)
Eosinophils Relative: 2 %
HCT: 30.1 % — ABNORMAL LOW (ref 39.0–52.0)
Hemoglobin: 9.8 g/dL — ABNORMAL LOW (ref 13.0–17.0)
Immature Granulocytes: 0 %
Lymphocytes Relative: 15 %
Lymphs Abs: 1.2 10*3/uL (ref 0.7–4.0)
MCH: 26 pg (ref 26.0–34.0)
MCHC: 32.6 g/dL (ref 30.0–36.0)
MCV: 79.8 fL — ABNORMAL LOW (ref 80.0–100.0)
Monocytes Absolute: 1.1 10*3/uL — ABNORMAL HIGH (ref 0.1–1.0)
Monocytes Relative: 13 %
Neutro Abs: 5.7 10*3/uL (ref 1.7–7.7)
Neutrophils Relative %: 69 %
Platelet Count: 314 10*3/uL (ref 150–400)
RBC: 3.77 MIL/uL — ABNORMAL LOW (ref 4.22–5.81)
RDW: 16.8 % — ABNORMAL HIGH (ref 11.5–15.5)
WBC Count: 8.1 10*3/uL (ref 4.0–10.5)
nRBC: 0 % (ref 0.0–0.2)

## 2022-05-12 NOTE — H&P (View-Only) (Signed)
I met this pt for this first time today at his Initial Med Onc consult with Dr.Mohamed and Cassie Heilingoetter. The pt is accompanied by his wife, [name]. I introduced myself as the thoracic nurse navigator and explained my role in the context of his cancer care. I would ensure that appointments for his referrals have been made, imaging scheduled, provide information about ancillary and support services provided at the [facility]. As was discussed with Dr.Mohamed, the pt will need to see a Pulmonologist to schedule a biopsy, as well as having a PET scan and brain MRI to complete his staging work up. I provided the number for Central Scheduling and suggested they call to schedule his scans on Monday if they haven't heard from them. The referrals for the imaging and to Pulmonology were entered by Cassie. I will make sure the appointments have been scheduled. The pt and his wife denied questions or concerns at this time. I provided them with my card and contact information should any concerns or questions arise. I will follow up with this pt at his next appointment with Dr.Mohamed.

## 2022-05-12 NOTE — Patient Instructions (Addendum)
It was nice meeting you today.  -I know we covered a lot of important information.  -The main take away point is there are 3 things we need to do: 1) We need a piece of this spot in the lung (biopsy) so we know what this is. The treatment depends on what type of cancer this is. There are different types of lung cancer. We also need to make sure this isn't related to your history of hepatocellular carcinoma 2) We need to do a PET scan which scans your body to makes sure this has not spread to any other parts of the body 3) We also need a brain MRI to complete the staging workup to make sure nothing has spread to the brain  We will see you back for a follow up in about 2.5 weeks or so. Hopefully when we will have the results from all of these studies so we know how to best treat you.   Someone should be calling you from the scan department. Please be on the lookout for a call from their office and please call them back to schedule this at your earliest convenience. If you need to call them, their number is [phone]

## 2022-05-12 NOTE — Progress Notes (Signed)
I met this pt for this first time today at his Initial Med Onc consult with Dr.Mohamed and [name]. The pt is accompanied by his wife, [name]. I introduced myself as the thoracic nurse navigator and explained my role in the context of his cancer care. I would ensure that appointments for his referrals have been made, imaging scheduled, provide information about ancillary and support services provided at the [facility]. As was discussed with Dr.Mohamed, the pt will need to see a Pulmonologist to schedule a biopsy, as well as having a PET scan and brain MRI to complete his staging work up. I provided the number for Central Scheduling and suggested they call to schedule his scans on Monday if they haven't heard from them. The referrals for the imaging and to Pulmonology were entered by Daniel. I will make sure the appointments have been scheduled. The pt and his wife denied questions or concerns at this time. I provided them with my card and contact information should any concerns or questions arise. I will follow up with this pt at his next appointment with Dr.Mohamed.

## 2022-05-13 ENCOUNTER — Telehealth: Payer: Self-pay | Admitting: Physician Assistant

## 2022-05-13 NOTE — Telephone Encounter (Signed)
Scheduled per 01/04 los, patient has been called and notified.

## 2022-05-23 ENCOUNTER — Telehealth: Payer: Self-pay | Admitting: Internal Medicine

## 2022-05-23 ENCOUNTER — Encounter: Payer: Self-pay | Admitting: Cardiology

## 2022-05-23 ENCOUNTER — Ambulatory Visit (HOSPITAL_COMMUNITY)
Admission: RE | Admit: 2022-05-23 | Discharge: 2022-05-23 | Disposition: A | Discharge status: Home / Self Care | Payer: Medicare Other | Source: Ambulatory Visit | Attending: Physician Assistant | Admitting: Physician Assistant

## 2022-05-23 DIAGNOSIS — C3490 Malignant neoplasm of unspecified part of unspecified bronchus or lung: Secondary | ICD-10-CM | POA: Diagnosis not present

## 2022-05-23 MED ORDER — GADOBUTROL 1 MMOL/ML IV SOLN
6.0000 mL | Freq: Once | INTRAVENOUS | Status: AC | PRN
  Administered 2022-05-23: 6 mL via INTRAVENOUS

## 2022-05-23 NOTE — Telephone Encounter (Signed)
Called patient regarding upcoming January appointments patient has been called and notified.

## 2022-05-26 ENCOUNTER — Encounter (HOSPITAL_BASED_OUTPATIENT_CLINIC_OR_DEPARTMENT_OTHER): Payer: Self-pay | Admitting: Pulmonary Disease

## 2022-05-26 ENCOUNTER — Ambulatory Visit (INDEPENDENT_AMBULATORY_CARE_PROVIDER_SITE_OTHER): Payer: Medicare Other | Admitting: Pulmonary Disease

## 2022-05-26 ENCOUNTER — Encounter: Payer: Self-pay | Admitting: Pulmonary Disease

## 2022-05-26 VITALS — BP 130/60 | HR 86 | Ht 71.0 in | Wt 134.4 lb

## 2022-05-26 DIAGNOSIS — R918 Other nonspecific abnormal finding of lung field: Secondary | ICD-10-CM

## 2022-05-26 NOTE — Patient Instructions (Addendum)
Right lung mass --We will contact you regarding time and place for procedure. Please be available to answer your phone --Once the procedure is scheduled, nothing to eat by mouth after midnight the day before the procedure --Please call our office for any questions or concerns  We will plan to have your procedure before the end of this month

## 2022-05-26 NOTE — Progress Notes (Signed)
Subjective:   PATIENT ID: [name] GENDER: male DOB: Feb 14, 1941, MRN: [id number]  Chief Complaint  Patient presents with   Consult    Found growth on lungs, little discomfort    Reason for Visit: New consult for lung mass  [name] is a 82 year old male with aortic stenosis s/p AVR, HTN, DM2, HLD chronic hepatitis C, hepatocellular carcinoma s/p ablation 2020, hx DVT, iron deficiency who presents for evaluation for lung mass  He was recently evaluated by Oncology for paratracheal mass. He was referred in December after complaints of cough and had CXR demonstrating 3.7 x 5.6 x 3.1 cm right paratracheal density with mass effect on the trachea. Follow-up CT 05/05/22 with large apical lung mass concerning for malignancy seen.   He still has cough that is sometimes productive. Denies wheezing or shortness of breath. Reports good appetite however not gaining weight despite ensure. Denies night sweats. Denies unexplained fevers or chills.  Social History: Former smoker x 30 years. 1.5 ppd. Quit 30 years ago.  Retired Previously worked at [employer]  I have personally reviewed patient's past medical/family/social history, allergies, current medications.  Past Medical History:  Diagnosis Date   Acute epididymo-orchitis    Aortic stenosis    s/p bioprosthetic AVR (Dr. Cyndia Bent) 11/2013   Arthritis    Diabetes mellitus without complication [facility])    ED (erectile dysfunction)    Essential hypertension, benign    Heart murmur    Hep C w/o coma, chronic ([facility])    History of blood transfusion    History of DVT of lower extremity    Hx of echocardiogram    Echo (8/15):  Severe LVH, EF 60-65%, no RWMA, Gr 3 DD, AVR ok (mean 21 mmHg), MAC, mild LAE, mild to mod RAE   Hypercholesterolemia      Family History  Problem Relation Age of Onset   Diabetes Father    Stroke Father    Diabetes Mother    Stroke Mother    Hypertension Unknown        family history   Heart attack Neg  Hx      Social History   Occupational History   Not on file  Tobacco Use   Smoking status: Former    Packs/day: 1.50    Years: 31.00    Total pack years: 46.50    Types: Cigarettes    Quit date: 1993    Years since quitting: 31.0   Smokeless tobacco: Never  Vaping Use   Vaping Use: Never used  Substance and Sexual Activity   Alcohol use: No   Drug use: No   Sexual activity: Not on file    Allergies  Allergen Reactions   Ezetimibe-Simvastatin     Leg pains Restlessness at night   Other reaction(s): leg pain Leg pains Other reaction(s): leg pain  Other reaction(s): leg pain   Metoprolol     Other reaction(s): bradycardia Other reaction(s): bradycardia   Other     Other reaction(s): discomfort Other reaction(s): bradycardia   Rosuvastatin     Discomfort, RESTLESS, CAN'T SLEEP Discomfort, RESTLESS, CAN'T SLEEP Discomfort, RESTLESS, CAN'T SLEEP  Other reaction(s): discomfort     Outpatient Medications Prior to Visit  Medication Sig Dispense Refill   acetaminophen (TYLENOL) 500 MG tablet Take 500 mg by mouth every 6 (six) hours as needed for mild pain or moderate pain.     amLODipine (NORVASC) 5 MG tablet Take 5 mg by mouth daily.  aspirin EC 81 MG EC tablet Take 1 tablet (81 mg total) by mouth daily.     atorvastatin (LIPITOR) 20 MG tablet Take 1 tablet by mouth daily.     Ferrous Sulfate (IRON) 325 (65 Fe) MG TABS 1 tablet     glucose blood test strip      glucose blood test strip as directed DX 250     lisinopril-hydrochlorothiazide (PRINZIDE,ZESTORETIC) 20-25 MG per tablet Take 1 tablet by mouth daily.     metFORMIN (GLUCOPHAGE) 1000 MG tablet Take 1 tablet by mouth 2 (two) times daily with a meal.     mupirocin ointment (BACTROBAN) 2 % APPLY TO LEFT  SECONDTOE ONCE DAILY WITH DRESSING     SF 5000 PLUS 1.1 % CREA dental cream BRUSH TEETH HS WITH TOOTHPASTE THEN EXPECTORATE  2   tamsulosin (FLOMAX) 0.4 MG CAPS capsule Take by mouth.     ferrous gluconate  (FERGON) 324 MG tablet Take 1 tablet (324 mg total) by mouth daily with breakfast. 30 tablet 3   FLUZONE HIGH-DOSE 0.5 ML injection Inject as directed once. (Patient not taking: Reported on 05/26/2022)  0   No facility-administered medications prior to visit.    Review of Systems  Constitutional:  Negative for chills, diaphoresis, fever, malaise/fatigue and weight loss.  HENT:  Negative for congestion.   Respiratory:  Positive for cough. Negative for hemoptysis, sputum production, shortness of breath and wheezing.   Cardiovascular:  Negative for chest pain, palpitations and leg swelling.     Objective:   Vitals:   05/26/22 0835  BP: 130/60  Pulse: 86  SpO2: 99%  Weight: 134 lb 6.4 oz (61 kg)  Height: _0  (1.803 m)   SpO2: 99 % O2 Device: None (Room air)  Physical Exam: General: Well-appearing, no acute distress HENT: [place], AT Eyes: EOMI, no scleral icterus Respiratory: Clear to auscultation bilaterally.  No crackles, wheezing or rales Cardiovascular: RRR, -M/R/G, no JVD Extremities:-Edema,-tenderness Neuro: AAO x4, CNII-XII grossly intact Psych: Normal mood, normal affect  Data Reviewed:  Imaging: MRI Brain 05/23/22 - No metastatic disease  PFT: None on file  Labs: CBC    Component Value Date/Time   WBC 8.1 05/12/2022 1335   WBC 6.2 11/22/2013 0350   RBC 3.77 (L) 05/12/2022 1335   HGB 9.8 (L) 05/12/2022 1335   HCT 30.1 (L) 05/12/2022 1335   PLT 314 05/12/2022 1335   MCV 79.8 (L) 05/12/2022 1335   MCH 26.0 05/12/2022 1335   MCHC 32.6 05/12/2022 1335   RDW 16.8 (H) 05/12/2022 1335   LYMPHSABS 1.2 05/12/2022 1335   MONOABS 1.1 (H) 05/12/2022 1335   EOSABS 0.1 05/12/2022 1335   BASOSABS 0.0 05/12/2022 1335        Assessment & Plan:   Discussion: 82 year old male with aortic stenosis s/p AVR, HTN, DM2, HLD chronic hepatitis C, hepatocellular carcinoma s/p ablation 2020, hx DVT, iron deficiency who presents for evaluation for lung mass  Based on  imaging, lung mass is highly concerning for malignancy. May potentially represent infection. We discussed diagnostic testing including EBUS bronchoscopy vs navigational bronchoscopy vs conservative management with further imaging. After addressing patient/family's questions, patient wishes to pursue diagnostic testing via bronchoscopy. We discussed risks and benefits of procedure including infection, bleeding and lung collapse. Patient consented to procedure. Coordinated procedure with RN and OR scheduler.  Right apical lung mass -Oncology following. Planned for staging with PET tomorrow -Schedule EBUS    Health Maintenance Immunization History  Administered Date(s) Administered  Influenza, High Dose Seasonal PF 02/03/2017, 02/06/2018, 02/05/2019   CT Lung Screen - not qualified with lung mass  Orders Placed This Encounter  Procedures   Ambulatory referral to Pulmonology    Referral Priority:   Routine    Referral Type:   Consultation    Referral Reason:   Specialty Services Required    Requested Specialty:   Pulmonary Disease    Number of Visits Requested:   1  No orders of the defined types were placed in this encounter.   No follow-ups on file.  I have spent a total time of 60-minutes on the day of the appointment reviewing prior documentation, coordinating care and discussing medical diagnosis and plan with the patient/family. Imaging, labs and tests included in this note have been reviewed and interpreted independently by me.  Clever, MD [facility] 05/26/2022 10:22 AM  Office Number [phone]

## 2022-05-26 NOTE — H&P (View-Only) (Signed)
Subjective:   PATIENT ID: [name] GENDER: male DOB: Feb 14, 1941, MRN: [id number]  Chief Complaint  Patient presents with   Consult    Found growth on lungs, little discomfort    Reason for Visit: New consult for lung mass  [name] is a 82 year old male with aortic stenosis s/p AVR, HTN, DM2, HLD chronic hepatitis C, hepatocellular carcinoma s/p ablation 2020, hx DVT, iron deficiency who presents for evaluation for lung mass  He was recently evaluated by Oncology for paratracheal mass. He was referred in December after complaints of cough and had CXR demonstrating 3.7 x 5.6 x 3.1 cm right paratracheal density with mass effect on the trachea. Follow-up CT 05/05/22 with large apical lung mass concerning for malignancy seen.   He still has cough that is sometimes productive. Denies wheezing or shortness of breath. Reports good appetite however not gaining weight despite ensure. Denies night sweats. Denies unexplained fevers or chills.  Social History: Former smoker x 30 years. 1.5 ppd. Quit 30 years ago.  Retired Previously worked at [employer]  I have personally reviewed patient's past medical/family/social history, allergies, current medications.  Past Medical History:  Diagnosis Date   Acute epididymo-orchitis    Aortic stenosis    s/p bioprosthetic AVR (Dr. Cyndia Bent) 11/2013   Arthritis    Diabetes mellitus without complication [facility])    ED (erectile dysfunction)    Essential hypertension, benign    Heart murmur    Hep C w/o coma, chronic ([facility])    History of blood transfusion    History of DVT of lower extremity    Hx of echocardiogram    Echo (8/15):  Severe LVH, EF 60-65%, no RWMA, Gr 3 DD, AVR ok (mean 21 mmHg), MAC, mild LAE, mild to mod RAE   Hypercholesterolemia      Family History  Problem Relation Age of Onset   Diabetes Father    Stroke Father    Diabetes Mother    Stroke Mother    Hypertension Unknown        family history   Heart attack Neg  Hx      Social History   Occupational History   Not on file  Tobacco Use   Smoking status: Former    Packs/day: 1.50    Years: 31.00    Total pack years: 46.50    Types: Cigarettes    Quit date: 1993    Years since quitting: 31.0   Smokeless tobacco: Never  Vaping Use   Vaping Use: Never used  Substance and Sexual Activity   Alcohol use: No   Drug use: No   Sexual activity: Not on file    Allergies  Allergen Reactions   Ezetimibe-Simvastatin     Leg pains Restlessness at night   Other reaction(s): leg pain Leg pains Other reaction(s): leg pain  Other reaction(s): leg pain   Metoprolol     Other reaction(s): bradycardia Other reaction(s): bradycardia   Other     Other reaction(s): discomfort Other reaction(s): bradycardia   Rosuvastatin     Discomfort, RESTLESS, CAN'T SLEEP Discomfort, RESTLESS, CAN'T SLEEP Discomfort, RESTLESS, CAN'T SLEEP  Other reaction(s): discomfort     Outpatient Medications Prior to Visit  Medication Sig Dispense Refill   acetaminophen (TYLENOL) 500 MG tablet Take 500 mg by mouth every 6 (six) hours as needed for mild pain or moderate pain.     amLODipine (NORVASC) 5 MG tablet Take 5 mg by mouth daily.  aspirin EC 81 MG EC tablet Take 1 tablet (81 mg total) by mouth daily.     atorvastatin (LIPITOR) 20 MG tablet Take 1 tablet by mouth daily.     Ferrous Sulfate (IRON) 325 (65 Fe) MG TABS 1 tablet     glucose blood test strip      glucose blood test strip as directed DX 250     lisinopril-hydrochlorothiazide (PRINZIDE,ZESTORETIC) 20-25 MG per tablet Take 1 tablet by mouth daily.     metFORMIN (GLUCOPHAGE) 1000 MG tablet Take 1 tablet by mouth 2 (two) times daily with a meal.     mupirocin ointment (BACTROBAN) 2 % APPLY TO LEFT  SECONDTOE ONCE DAILY WITH DRESSING     SF 5000 PLUS 1.1 % CREA dental cream BRUSH TEETH HS WITH TOOTHPASTE THEN EXPECTORATE  2   tamsulosin (FLOMAX) 0.4 MG CAPS capsule Take by mouth.     ferrous gluconate  (FERGON) 324 MG tablet Take 1 tablet (324 mg total) by mouth daily with breakfast. 30 tablet 3   FLUZONE HIGH-DOSE 0.5 ML injection Inject as directed once. (Patient not taking: Reported on 05/26/2022)  0   No facility-administered medications prior to visit.    Review of Systems  Constitutional:  Negative for chills, diaphoresis, fever, malaise/fatigue and weight loss.  HENT:  Negative for congestion.   Respiratory:  Positive for cough. Negative for hemoptysis, sputum production, shortness of breath and wheezing.   Cardiovascular:  Negative for chest pain, palpitations and leg swelling.     Objective:   Vitals:   05/26/22 0835  BP: 130/60  Pulse: 86  SpO2: 99%  Weight: 134 lb 6.4 oz (61 kg)  Height: 5' 11" (1.803 m)   SpO2: 99 % O2 Device: None (Room air)  Physical Exam: General: Well-appearing, no acute distress HENT: [place], AT Eyes: EOMI, no scleral icterus Respiratory: Clear to auscultation bilaterally.  No crackles, wheezing or rales Cardiovascular: RRR, -M/R/G, no JVD Extremities:-Edema,-tenderness Neuro: AAO x4, CNII-XII grossly intact Psych: Normal mood, normal affect  Data Reviewed:  Imaging: MRI Brain 05/23/22 - No metastatic disease  PFT: None on file  Labs: CBC    Component Value Date/Time   WBC 8.1 05/12/2022 1335   WBC 6.2 11/22/2013 0350   RBC 3.77 (L) 05/12/2022 1335   HGB 9.8 (L) 05/12/2022 1335   HCT 30.1 (L) 05/12/2022 1335   PLT 314 05/12/2022 1335   MCV 79.8 (L) 05/12/2022 1335   MCH 26.0 05/12/2022 1335   MCHC 32.6 05/12/2022 1335   RDW 16.8 (H) 05/12/2022 1335   LYMPHSABS 1.2 05/12/2022 1335   MONOABS 1.1 (H) 05/12/2022 1335   EOSABS 0.1 05/12/2022 1335   BASOSABS 0.0 05/12/2022 1335        Assessment & Plan:   Discussion: 81 year old male with aortic stenosis s/p AVR, HTN, DM2, HLD chronic hepatitis C, hepatocellular carcinoma s/p ablation 2020, hx DVT, iron deficiency who presents for evaluation for lung mass  Based on  imaging, lung mass is highly concerning for malignancy. May potentially represent infection. We discussed diagnostic testing including EBUS bronchoscopy vs navigational bronchoscopy vs conservative management with further imaging. After addressing patient/family's questions, patient wishes to pursue diagnostic testing via bronchoscopy. We discussed risks and benefits of procedure including infection, bleeding and lung collapse. Patient consented to procedure. Coordinated procedure with RN and OR scheduler.  Right apical lung mass -Oncology following. Planned for staging with PET tomorrow -Schedule EBUS    Health Maintenance Immunization History  Administered Date(s) Administered     Influenza, High Dose Seasonal PF 02/03/2017, 02/06/2018, 02/05/2019   CT Lung Screen - not qualified with lung mass  Orders Placed This Encounter  Procedures   Ambulatory referral to Pulmonology    Referral Priority:   Routine    Referral Type:   Consultation    Referral Reason:   Specialty Services Required    Requested Specialty:   Pulmonary Disease    Number of Visits Requested:   1  No orders of the defined types were placed in this encounter.   No follow-ups on file.  I have spent a total time of 60-minutes on the day of the appointment reviewing prior documentation, coordinating care and discussing medical diagnosis and plan with the patient/family. Imaging, labs and tests included in this note have been reviewed and interpreted independently by me.  Clever, MD [facility] 05/26/2022 10:22 AM  Office Number [phone]

## 2022-05-27 ENCOUNTER — Encounter (HOSPITAL_COMMUNITY)
Admission: RE | Admit: 2022-05-27 | Discharge: 2022-05-27 | Disposition: A | Discharge status: Home / Self Care | Payer: Medicare Other | Source: Ambulatory Visit | Attending: Physician Assistant | Admitting: Physician Assistant

## 2022-05-27 DIAGNOSIS — C3490 Malignant neoplasm of unspecified part of unspecified bronchus or lung: Secondary | ICD-10-CM | POA: Insufficient documentation

## 2022-05-27 DIAGNOSIS — J432 Centrilobular emphysema: Secondary | ICD-10-CM | POA: Diagnosis not present

## 2022-05-27 DIAGNOSIS — R911 Solitary pulmonary nodule: Secondary | ICD-10-CM | POA: Insufficient documentation

## 2022-05-27 DIAGNOSIS — N400 Enlarged prostate without lower urinary tract symptoms: Secondary | ICD-10-CM | POA: Insufficient documentation

## 2022-05-27 DIAGNOSIS — M19011 Primary osteoarthritis, right shoulder: Secondary | ICD-10-CM | POA: Diagnosis not present

## 2022-05-27 DIAGNOSIS — J439 Emphysema, unspecified: Secondary | ICD-10-CM | POA: Diagnosis not present

## 2022-05-27 DIAGNOSIS — I700 Atherosclerosis of aorta: Secondary | ICD-10-CM | POA: Insufficient documentation

## 2022-05-27 DIAGNOSIS — I2510 Atherosclerotic heart disease of native coronary artery without angina pectoris: Secondary | ICD-10-CM | POA: Diagnosis not present

## 2022-05-27 DIAGNOSIS — R590 Localized enlarged lymph nodes: Secondary | ICD-10-CM | POA: Diagnosis not present

## 2022-05-27 LAB — GLUCOSE, CAPILLARY: Glucose-Capillary: 119 mg/dL — ABNORMAL HIGH (ref 70–99)

## 2022-05-27 MED ORDER — FLUDEOXYGLUCOSE F - 18 (FDG) INJECTION
6.7000 | Freq: Once | INTRAVENOUS | Status: AC
  Administered 2022-05-27: 6.7 via INTRAVENOUS

## 2022-05-30 ENCOUNTER — Other Ambulatory Visit: Payer: Self-pay

## 2022-05-30 ENCOUNTER — Other Ambulatory Visit: Payer: Medicare Other

## 2022-05-30 DIAGNOSIS — Z01812 Encounter for preprocedural laboratory examination: Secondary | ICD-10-CM

## 2022-05-30 NOTE — Progress Notes (Deleted)
[facility] OFFICE PROGRESS NOTE  [name], [name], MD [address] [place] [address]  DIAGNOSIS: ***  PRIOR THERAPY:  CURRENT THERAPY:  INTERVAL HISTORY: [name] 82 y.o. male returns for *** regular *** visit for followup of ***   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   Arthritis    Diabetes mellitus without complication [facility])    ED (erectile dysfunction)    Essential hypertension, benign    Heart murmur    Hep C w/o coma, chronic ([place])    History of blood transfusion    History of DVT of lower extremity    Hx of echocardiogram    Echo (8/15):  Severe LVH, EF 60-65%, no RWMA, Gr 3 DD, AVR ok (mean 21 mmHg), MAC, mild LAE, mild to mod RAE   Hypercholesterolemia     ALLERGIES:  is allergic to ezetimibe-simvastatin, metoprolol, other, and rosuvastatin.  MEDICATIONS:  Current Outpatient Medications  Medication Sig Dispense Refill   acetaminophen (TYLENOL) 500 MG tablet Take 500 mg by mouth every 6 (six) hours as needed for mild pain or moderate pain.     amLODipine (NORVASC) 5 MG tablet Take 5 mg by mouth daily.     aspirin EC 81 MG EC tablet Take 1 tablet (81 mg total) by mouth daily.     atorvastatin (LIPITOR) 20 MG tablet Take 1 tablet by mouth daily.     ferrous gluconate (FERGON) 324 MG tablet Take 1 tablet (324 mg total) by mouth daily with breakfast. 30 tablet 3   Ferrous Sulfate (IRON) 325 (65 Fe) MG TABS 1 tablet     glucose blood test strip      glucose blood test strip as directed DX 250     lisinopril-hydrochlorothiazide (PRINZIDE,ZESTORETIC) 20-25 MG per tablet Take 1 tablet by mouth daily.     metFORMIN (GLUCOPHAGE) 1000 MG tablet Take 1 tablet by mouth 2 (two) times daily with a meal.     mupirocin ointment (BACTROBAN) 2 % APPLY TO LEFT  SECONDTOE ONCE DAILY WITH DRESSING     SF 5000 PLUS 1.1 % CREA dental  cream BRUSH TEETH HS WITH TOOTHPASTE THEN EXPECTORATE  2   tamsulosin (FLOMAX) 0.4 MG CAPS capsule Take by mouth.     No current facility-administered medications for this visit.    SURGICAL HISTORY:  Past Surgical History:  Procedure Laterality Date   AORTIC VALVE REPLACEMENT N/A 11/19/2013   Procedure: AORTIC VALVE REPLACEMENT (AVR);  Surgeon: Gaye Pollack, MD;  Location: [facility];  Service: Open Heart Surgery;  Laterality: N/A;   BUNIONECTOMY Bilateral 2008   CARDIAC CATHETERIZATION  10/01/13   COLONOSCOPY  01/2010   internal hemorrhoids.  Dr Deatra Ina   ESOPHAGOGASTRODUODENOSCOPY N/A 04/24/2013   Procedure: ESOPHAGOGASTRODUODENOSCOPY (EGD);  Surgeon: Jerene Bears, MD;  Location: [facility] ENDOSCOPY;  Service: Endoscopy;  Laterality: N/A;   INGUINAL HERNIA REPAIR Right    INTRAOPERATIVE TRANSESOPHAGEAL ECHOCARDIOGRAM N/A 11/19/2013   Procedure: INTRAOPERATIVE TRANSESOPHAGEAL ECHOCARDIOGRAM;  Surgeon: Gaye Pollack, MD;  Location: [place] OR;  Service: Open Heart Surgery;  Laterality: N/A;   KNEE ARTHROSCOPY Left 05/2009   Dr Collier Salina   LAMINECTOMY  06/2005   Dr Joya Salm. multiple lumbar level laminectomies.    LEFT AND RIGHT HEART CATHETERIZATION WITH CORONARY ANGIOGRAM N/A 10/01/2013   Procedure: LEFT AND RIGHT HEART CATHETERIZATION WITH CORONARY ANGIOGRAM;  Surgeon:  Candee Furbish, MD;  Location: [facility] CATH LAB;  Service: Cardiovascular;  Laterality: N/A;   TEE WITHOUT CARDIOVERSION  11/2003   Aoritic valve sclerosis.    TONSILLECTOMY      REVIEW OF SYSTEMS:   Review of Systems  Constitutional: Negative for appetite change, chills, fatigue, fever and unexpected weight change.  HENT:   Negative for mouth sores, nosebleeds, sore throat and trouble swallowing.   Eyes: Negative for eye problems and icterus.  Respiratory: Negative for cough, hemoptysis, shortness of breath and wheezing.   Cardiovascular: Negative for chest pain and leg swelling.  Gastrointestinal: Negative for abdominal pain, constipation,  diarrhea, nausea and vomiting.  Genitourinary: Negative for bladder incontinence, difficulty urinating, dysuria, frequency and hematuria.   Musculoskeletal: Negative for back pain, gait problem, neck pain and neck stiffness.  Skin: Negative for itching and rash.  Neurological: Negative for dizziness, extremity weakness, gait problem, headaches, light-headedness and seizures.  Hematological: Negative for adenopathy. Does not bruise/bleed easily.  Psychiatric/Behavioral: Negative for confusion, depression and sleep disturbance. The patient is not nervous/anxious.     PHYSICAL EXAMINATION:  There were no vitals taken for this visit.  ECOG PERFORMANCE STATUS: {CHL ONC ECOG [id number]  Physical Exam  Constitutional: Oriented to person, place, and time and well-developed, well-nourished, and in no distress. No distress.  HENT:  Head: Normocephalic and atraumatic.  Mouth/Throat: Oropharynx is clear and moist. No oropharyngeal exudate.  Eyes: Conjunctivae are normal. Right eye exhibits no discharge. Left eye exhibits no discharge. No scleral icterus.  Neck: Normal range of motion. Neck supple.  Cardiovascular: Normal rate, regular rhythm, normal heart sounds and intact distal pulses.   Pulmonary/Chest: Effort normal and breath sounds normal. No respiratory distress. No wheezes. No rales.  Abdominal: Soft. Bowel sounds are normal. Exhibits no distension and no mass. There is no tenderness.  Musculoskeletal: Normal range of motion. Exhibits no edema.  Lymphadenopathy:    No cervical adenopathy.  Neurological: Alert and oriented to person, place, and time. Exhibits normal muscle tone. Gait normal. Coordination normal.  Skin: Skin is warm and dry. No rash noted. Not diaphoretic. No erythema. No pallor.  Psychiatric: Mood, memory and judgment normal.  Vitals reviewed.  LABORATORY DATA: Lab Results  Component Value Date   WBC 8.1 05/12/2022   HGB 9.8 (L) 05/12/2022   HCT 30.1 (L)  05/12/2022   MCV 79.8 (L) 05/12/2022   PLT 314 05/12/2022      Chemistry      Component Value Date/Time   NA 136 05/12/2022 1335   K 4.4 05/12/2022 1335   CL 102 05/12/2022 1335   CO2 25 05/12/2022 1335   BUN 23 05/12/2022 1335   CREATININE 1.06 05/12/2022 1335      Component Value Date/Time   CALCIUM 9.9 05/12/2022 1335   ALKPHOS 52 05/12/2022 1335   AST 14 (L) 05/12/2022 1335   ALT 6 05/12/2022 1335   BILITOT 0.2 (L) 05/12/2022 1335       RADIOGRAPHIC STUDIES:  NM PET Image Initial (PI) Skull Base To Thigh  Result Date: 05/27/2022 CLINICAL DATA:  Initial treatment strategy for non-small-cell lung cancer. EXAM: NUCLEAR MEDICINE PET SKULL BASE TO THIGH TECHNIQUE: 627 mCi F-18 FDG was injected intravenously. Full-ring PET imaging was performed from the skull base to thigh after the radiotracer. CT data was obtained and used for attenuation correction and anatomic localization. Fasting blood glucose: 119 mg/dl COMPARISON:  Chest CT 05/05/2022 FINDINGS: Mediastinal blood pool activity: SUV max 2.0 Liver activity: SUV max NA NECK:  No hypermetabolic lymph nodes in the neck. Incidental CT findings: None. CHEST: Patient's known right apical mass is markedly hypermetabolic with SUV max = 41.9. 11 mm precarinal node visible on image 37/9 is hypermetabolic with SUV max = 4.6. Lesion extends into the upper right hilum. Incidental CT findings: Centrilobular and paraseptal emphysema evident. 4 mm anterior right upper lobe nodule on 67/4 is stable, below PET threshold for resolution. Coronary artery calcification is evident. Status post aortic valve replacement. Mitral annular calcification evident. Mild atherosclerotic calcification is noted in the wall of the thoracic aorta. ABDOMEN/PELVIS: No abnormal hypermetabolic activity within the liver, pancreas, adrenal glands, or spleen. No hypermetabolic lymph nodes in the abdomen or pelvis. Physiologic uptake noted in small bowel loops of the pelvis  Incidental CT findings: Bilateral renal cysts evident. There is moderate atherosclerotic calcification of the abdominal aorta without aneurysm. Moderate to large stool volume. Circumferential bladder wall thickening evident likely accentuated by underdistention. Prostate gland is enlarged. SKELETON: No focal hypermetabolic activity to suggest skeletal metastasis. Incidental CT findings: Degenerative changes noted in both shoulders. IMPRESSION: 1. Right apical mass is markedly hypermetabolic consistent with known primary bronchogenic neoplasm. 2. Hypermetabolic precarinal lymph node is compatible with metastatic disease. 3. No evidence for hypermetabolic metastatic disease in the neck, abdomen, or pelvis. 4. 4 mm anterior right upper lobe pulmonary nodule is stable, below PET threshold for resolution. Attention on follow-up recommended. 5. Prostatomegaly with circumferential bladder wall thickening likely accentuated by underdistention. 6. Aortic Atherosclerosis (ICD10-I70.0) and Emphysema (ICD10-J43.9). Electronically Signed   By: Misty Stanley M.D.   On: 05/27/2022 13:37   MR Brain W Wo Contrast  Result Date: 05/24/2022 CLINICAL DATA:  Lung carcinoma staging EXAM: MRI HEAD WITHOUT AND WITH CONTRAST TECHNIQUE: Multiplanar, multiecho pulse sequences of the brain and surrounding structures were obtained without and with intravenous contrast. CONTRAST:  3m GADAVIST GADOBUTROL 1 MMOL/ML IV SOLN COMPARISON:  None Available. FINDINGS: Brain: No acute infarction, hemorrhage, hydrocephalus, extra-axial collection or mass lesion. There is multifocal hyperintense T2-weighted signal within the periventricular and deep white matter. 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 No chronic microhemorrhage. Midline structures are normal. Normal CSF spaces. No abnormal contrast enhancement. Vascular: Normal flow voids. Skull and upper cervical spine: Normal marrow signal. Sinuses/Orbits: Negative. Other: None.  IMPRESSION: 1. No intracranial metastatic disease. 2. 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 Electronically Signed   By: KUlyses JarredM.D.   On: 05/24/2022 01:39   CT CHEST W CONTRAST  Result Date: 05/05/2022 CLINICAL DATA:  Right paratracheal mass on chest radiographs. Former smoker with cough. History of hepatocellular carcinoma. * Tracking Code: BO * EXAM: CT CHEST WITH CONTRAST TECHNIQUE: Multidetector CT imaging of the chest was performed during intravenous contrast administration. RADIATION DOSE REDUCTION: This exam was performed according to the departmental dose-optimization program which includes automated exposure control, adjustment of the mA and/or kV according to patient size and/or use of iterative reconstruction technique. CONTRAST:  765mISOVUE-300 IOPAMIDOL (ISOVUE-300) INJECTION 61% COMPARISON:  Radiographs 04/28/2022 and 12/25/2013. Abdominal MRI 10/09/2020. FINDINGS: Cardiovascular: No acute vascular findings. There is atherosclerosis of the aorta, great vessels and coronary arteries status post median sternotomy, CABG and aortic valve replacement. The heart size is normal. There is no pericardial effusion. Mediastinum/Nodes: Large right paratracheal mass seen on recent radiographs is further described below. There are scattered small mediastinal, hilar and supraclavicular lymph nodes, measuring up to 12 mm in the precarinal station (image  68/2). No axillary adenopathy. The thyroid gland, trachea and esophagus demonstrate no significant findings. Lungs/Pleura: No pleural effusion or pneumothorax. Moderate centrilobular and paraseptal emphysema with scattered subpleural reticulation in both lungs. Corresponding with the recent radiographic finding is a large centrally necrotic mass medially at the right lung apex, abutting the superior mediastinum. No definite tracheal or esophageal invasion. This measures 5.8 x 5.0 cm transverse on  image 43/2 and 5.9 cm on coronal image 69/3. Finding is highly suspicious for primary bronchogenic carcinoma. No other highly suspicious pulmonary nodules are identified. There is mild patchy airspace disease surrounding the medial right upper lobe mass. There is a 4 mm nodule anteriorly in the right upper lobe on image 58/5. Upper abdomen: The visualized upper abdomen demonstrates no acute or suspicious findings. No suspicious lesions are identified within the visualized liver. There are simple appearing renal cysts bilaterally for which no follow-up imaging is recommended. Musculoskeletal/Chest wall: There is no chest wall mass or suspicious osseous finding. Healed median sternotomy. The medial right upper lobe lung mass abuts the T3 and T4 vertebral bodies on the right, but causes no definite bone erosion. IMPRESSION: 1. Large centrally necrotic mass medially at the right lung apex, highly suspicious for primary bronchogenic carcinoma. This abuts the superior mediastinum and T3 and T4 vertebral bodies, but causes no definite mediastinal invasion or bone destruction. 2. Scattered small mediastinal, hilar and supraclavicular lymph nodes, nonspecific. 3. No evidence of distant metastatic disease. Small nonspecific separate right upper lobe pulmonary nodule. PET-CT may be helpful for staging. 4. Aortic Atherosclerosis (ICD10-I70.0) and Emphysema (ICD10-J43.9). Electronically Signed   By: Richardean Sale M.D.   On: 05/05/2022 10:29     ASSESSMENT/PLAN:  No problem-specific Assessment & Plan notes found for this encounter.   No orders of the defined types were placed in this encounter.    I spent {CHL ONC TIME VISIT - XHFSF:[id number]} counseling the patient face to face. The total time spent in the appointment was {CHL ONC TIME VISIT - XIDHW:[id number]}.  Jedrick Hutcherson L Chevis Weisensel, PA-C 05/30/22

## 2022-05-31 ENCOUNTER — Encounter (HOSPITAL_COMMUNITY): Payer: Self-pay | Admitting: Pulmonary Disease

## 2022-05-31 NOTE — Pre-Procedure Instructions (Signed)
PCP -  Cardiologist - Dr. Anne Fu  EKG - DOS Chest x-ray - CT 05/05/22 ECHO - 06/17/21 Cardiac Cath - 10/01/2013 CPAP - n/a  Fasting Blood Sugar:  105-118 Checks Blood Sugar:  1/day Blood Thinner Instructions: n/a Aspirin Instructions: n/a  ERAS Protcol - no COVID TEST- 05/30/22  Anesthesia review: yes - labs, cardiac hx  -------------  SDW INSTRUCTIONS:  Your procedure is scheduled on 06/02/2022. Please report to [facility] Main Entrance "A" at 1030 A.M., and check in at the Admitting office. Call this number if you have problems the morning of surgery: [phone]   Remember: Do not eat or drink after midnight the night before your surgery  Medications to take morning of surgery with a sip of water include: amlodipine, atorvastatin, flomax   As of today, STOP taking any Aspirin (unless otherwise instructed by your surgeon), Aleve, Naproxen, Ibuprofen, Motrin, Advil, Goody's, BC's, all herbal medications, fish oil, and all vitamins.  WHAT DO I DO ABOUT MY DIABETES MEDICATION?   Do not take Metformin the morning of surgery.  HOW TO MANAGE YOUR DIABETES BEFORE AND AFTER SURGERY  Why is it important to control my blood sugar before and after surgery? Improving blood sugar levels before and after surgery helps healing and can limit problems. A way of improving blood sugar control is eating a healthy diet by:  Eating less sugar and carbohydrates  Increasing activity/exercise  Talking with your doctor about reaching your blood sugar goals High blood sugars (greater than 180 mg/dL) can raise your risk of infections and slow your recovery, so you will need to focus on controlling your diabetes during the weeks before surgery. Make sure that the doctor who takes care of your diabetes knows about your planned surgery including the date and location.  How do I manage my blood sugar before surgery? Check your blood sugar at least 4 times a day, starting 2 days before surgery, to  make sure that the level is not too high or low.  Check your blood sugar the morning of your surgery when you wake up and every 2 hours until you get to the Short Stay unit.  If your blood sugar is less than 70 mg/dL, you will need to treat for low blood sugar: Do not take insulin. Treat a low blood sugar (less than 70 mg/dL) with  cup of clear juice (cranberry or apple), 4 glucose tablets, OR glucose gel. Recheck blood sugar in 15 minutes after treatment (to make sure it is greater than 70 mg/dL). If your blood sugar is not greater than 70 mg/dL on recheck, call [phone] for further instructions. Report your blood sugar to the short stay nurse when you get to Short Stay.  If you are admitted to the hospital after surgery: Your blood sugar will be checked by the staff and you will probably be given insulin after surgery (instead of oral diabetes medicines) to make sure you have good blood sugar levels. The goal for blood sugar control after surgery is 80-180 mg/dL.    The Morning of Surgery Do not wear jewelry, make-up or nail polish. Do not wear lotions, powders, or perfumes/colognes, or deodorant Do not bring valuables to the hospital. [facility] is not responsible for any belongings or valuables.  If you are a smoker, DO NOT Smoke 24 hours prior to surgery  If you wear a CPAP at night please bring your mask the morning of surgery   Remember that you must have someone to transport  you home after your surgery, and remain with you for 24 hours if you are discharged the same day.  Please bring cases for contacts, glasses, hearing aids, dentures or bridgework because it cannot be worn into surgery.   Patients discharged the day of surgery will not be allowed to drive home.   Please shower the NIGHT BEFORE/MORNING OF SURGERY (use antibacterial soap like DIAL soap if possible). Wear comfortable clothes the morning of surgery. Oral Hygiene is also important to reduce your risk of  infection.  Remember - BRUSH YOUR TEETH THE MORNING OF SURGERY WITH YOUR REGULAR TOOTHPASTE  Patient denies shortness of breath, fever, cough and chest pain.

## 2022-06-01 ENCOUNTER — Ambulatory Visit: Payer: Medicare Other | Admitting: Physician Assistant

## 2022-06-01 ENCOUNTER — Other Ambulatory Visit: Payer: Medicare Other

## 2022-06-01 LAB — NOVEL CORONAVIRUS, NAA: SARS-CoV-2, NAA: NOT DETECTED

## 2022-06-01 NOTE — Anesthesia Preprocedure Evaluation (Signed)
Anesthesia Evaluation  Patient identified by MRN, date of birth, ID band Patient awake    Reviewed: Allergy & Precautions, NPO status , Patient's Chart, lab work & pertinent test results  History of Anesthesia Complications Negative for: history of anesthetic complications  Airway Mallampati: I  TM Distance: >3 FB Neck ROM: Full    Dental  (+) Edentulous Upper, Edentulous Lower, Lower Dentures, Upper Dentures   Pulmonary former smoker Lung mass   breath sounds clear to auscultation       Cardiovascular hypertension, Pt. on medications (-) angina + Valvular Problems/Murmurs (s/p AVR)  Rhythm:Regular Rate:Normal  06/2021 ECHO: EF 70-75%, mild LVH, normal LVF, normal RVF, trivial MR, s/p AVR functioning well   Neuro/Psych negative neurological ROS     GI/Hepatic negative GI ROS,,,(+) Hepatitis -, C  Endo/Other  diabetes, Oral Hypoglycemic Agents    Renal/GU negative Renal ROS     Musculoskeletal  (+) Arthritis ,    Abdominal   Peds  Hematology negative hematology ROS (+)   Anesthesia Other Findings   Reproductive/Obstetrics                             Anesthesia Physical Anesthesia Plan  ASA: 3  Anesthesia Plan: General   Post-op Pain Management: Tylenol PO (pre-op)*   Induction: Intravenous  PONV Risk Score and Plan: 2 and Ondansetron and Dexamethasone  Airway Management Planned: Oral ETT  Additional Equipment: None  Intra-op Plan:   Post-operative Plan: Extubation in OR  Informed Consent: I have reviewed the patients History and Physical, chart, labs and discussed the procedure including the risks, benefits and alternatives for the proposed anesthesia with the patient or authorized representative who has indicated his/her understanding and acceptance.       Plan Discussed with: CRNA and Surgeon  Anesthesia Plan Comments: (PAT note written 06/01/2022 by Myra Gianotti,  PA-C.  )       Anesthesia Quick Evaluation

## 2022-06-01 NOTE — Progress Notes (Signed)
Anesthesia Chart Review: [name]   Case: [id number] Date/Time: 06/02/22 1245   Procedure: VIDEO BRONCHOSCOPY WITH ENDOBRONCHIAL ULTRASOUND   Anesthesia type: General   Pre-op diagnosis: MASS OF RIGHT UPPER LOBE   Location: [place] OR ROOM 10 / [place] [facility]   Surgeons: Margaretha Seeds, MD       DISCUSSION: Patient is a 82 year old male scheduled for the above procedure. He was found to have a right paratrachel mass on imaging for evaluation of cough. Right apical mass is markedly hypermetabolic on recent PET scan. Above procedure recommended.  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 spinal surgery (L3-S1 foraminotomy 06/17/05).  Last cardiology evaluation by Dr. Marlou Porch was on 06/02/21. Echo updated (see below CV) for more prominent sounding murmur. Findings included LVEF 70-75%, severe MAD with trivial MR, 21 mm pericardial AVR with mean gradient 13.2 mmHg. No angina. One year cardiology follow-up planned.   He same-day workup, so anesthesia team to evaluate on the day of surgery.   VS: Ht _0  (1.803 m)   Wt 59.9 kg   BMI 18.41 kg/m  BP Readings from Last 3 Encounters:  05/26/22 130/60  05/12/22 (!) 148/67  06/02/21 (!) 150/70   Pulse Readings from Last 3 Encounters:  05/26/22 86  05/12/22 80  06/02/21 72     PROVIDERS: Lujean Amel, MD is PCP  Candee Furbish, MD is cardiologist Curt Bears, MD is HEM-ONC Ebony Cargo, FNP is hepatology provider [facility]). 3-6 month follow-up MRI had been recommended after 10/2021 MRI abd showed a new indeterminate liver lesion.   LABS: Most recent labs in [facility] include: Lab Results  Component Value Date   WBC 8.1  05/12/2022   HGB 9.8 (L) 05/12/2022   HCT 30.1 (L) 05/12/2022   PLT 314 05/12/2022   GLUCOSE 77 05/12/2022   ALT 6 05/12/2022   AST 14 (L) 05/12/2022   NA 136 05/12/2022   K 4.4 05/12/2022   CL 102 05/12/2022   CREATININE 1.06 05/12/2022   BUN 23 05/12/2022   CO2 25 05/12/2022    IMAGES: PET Scan 05/27/22: MPRESSION: 1. Right apical mass is markedly hypermetabolic consistent with known primary bronchogenic neoplasm. 2. Hypermetabolic precarinal lymph node is compatible with metastatic disease. 3. No evidence for hypermetabolic metastatic disease in the neck, abdomen, or pelvis. 4. 4 mm anterior right upper lobe pulmonary nodule is stable, below PET threshold for resolution. Attention on follow-up recommended. 5. Prostatomegaly with circumferential bladder wall thickening likely accentuated by underdistention. 6. Aortic Atherosclerosis (ICD10-I70.0) and Emphysema (ICD10-J43.9).   MRI Brain 05/23/22: IMPRESSION: 1. No intracranial metastatic disease. 2. 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  CT Chest 05/05/22: IMPRESSION: 1. 3.7 x 5.6 x 3.1 cm right paratracheal density with mass effect on the trachea. This is concerning for a mass lesion. CT of the chest with contrast is recommended for further evaluation. 2. No other acute cardiopulmonary disease.  MRI Abd 10/09/20: IMPRESSION: 1. New 7 mm lesion in segment 8 with rim enhancement following contrast, indeterminate for small focus of hepatocellular carcinoma (LR-3). Follow-up MRI in 3-6 months recommended. 2. Stable presumed ablation defect inferiorly in the right hepatic lobe (segment 5). No other suspicious hepatic lesions. 3.  Stable renal cysts.     EKG: 06/02/2021: Sinus rhythm, nonspecific ST/T waves changes.    CV: Echo 06/17/21: IMPRESSIONS   1. Left ventricular ejection fraction, by estimation, is 70 to 75%. The  left ventricle has hyperdynamic  function. The left ventricle has no  regional wall motion abnormalities. There is mild concentric left  ventricular hypertrophy. Left ventricular  diastolic parameters are consistent with Grade I diastolic dysfunction  (impaired relaxation). Elevated left ventricular end-diastolic pressure.   2. Right ventricular systolic function is normal. The right ventricular  size is normal. There is normal pulmonary artery systolic pressure. The  estimated right ventricular systolic pressure is 38.2 mmHg.   3. Left atrial size was mildly dilated.   4. The mitral valve is degenerative. Trivial mitral valve regurgitation.  No evidence of mitral stenosis. Severe mitral annular calcification.   5. The aortic valve has been repaired/replaced. There is a 21 mm  Medtronic Magna Ease Pericardial valve present in the aortic position.      Aortic valve regurgitation is not visualized. Aortic valve mean  gradient measures 13.2 mmHg. Aortic valve Vmax measures 2.39 m/s.   6. The inferior vena cava is normal in size with greater than 50%  respiratory variability, suggesting right atrial pressure of 3 mmHg.   7. Compared to echo 06/04/2020, the mean AVG is unchanged    Cardiac cath 10/01/13: IMPRESSIONS:  1. Minor luminal irregularities throughout vessels. 2. Minimally elevated right-sided heart pressures. Normal cardiac output. 3. Severe aortic calcification and limitation of aortic valve noted during angiography. (Aortic valve was not crossed). Echo data demonstrated severe stenosis, severe calcification, mild aortic regurgitation, peak velocity of 5.0 m/s, mean gradient of 61 mm mercury. - S/p AVR (pericardial tissue) 11/19/13.   Carotid duplex 11/14/13:  - The vertebral arteries appear patent with antegrade flow. - Findings consistent with 1-39 percent stenosis involving the right internal carotid artery and the left internal carotid artery.   Past Medical History:  Diagnosis Date   Acute  epididymo-orchitis    Aortic stenosis    s/p bioprosthetic AVR (Dr. Cyndia Bent) 11/2013   Arthritis    Diabetes mellitus without complication [facility])    ED (erectile dysfunction)    Essential hypertension, benign    Heart murmur    Hep C w/o coma, chronic ([place])    History of blood transfusion    History of DVT of lower extremity    Hx of echocardiogram    Echo (8/15):  Severe LVH, EF 60-65%, no RWMA, Gr 3 DD, AVR ok (mean 21 mmHg), MAC, mild LAE, mild to mod RAE   Hypercholesterolemia    PONV (postoperative nausea and vomiting)     Past Surgical History:  Procedure Laterality Date   AORTIC VALVE REPLACEMENT N/A 11/19/2013   Procedure: AORTIC VALVE REPLACEMENT (AVR);  Surgeon: Gaye Pollack, MD;  Location: [facility];  Service: Open Heart Surgery;  Laterality: N/A;   BUNIONECTOMY Bilateral 05/09/2006   CARDIAC CATHETERIZATION  10/01/2013   COLONOSCOPY  01/07/2010   internal hemorrhoids.  Dr Deatra Ina   ESOPHAGOGASTRODUODENOSCOPY N/A 04/24/2013   Procedure: ESOPHAGOGASTRODUODENOSCOPY (EGD);  Surgeon: Jerene Bears, MD;  Location: [facility] ENDOSCOPY;  Service: Endoscopy;  Laterality: N/A;   INGUINAL HERNIA REPAIR Right    INTRAOPERATIVE TRANSESOPHAGEAL ECHOCARDIOGRAM N/A 11/19/2013   Procedure: INTRAOPERATIVE TRANSESOPHAGEAL ECHOCARDIOGRAM;  Surgeon: Gaye Pollack, MD;  Location: [place] OR;  Service: Open Heart Surgery;  Laterality: N/A;   KNEE ARTHROSCOPY Left 05/09/2009   Dr Collier Salina   LAMINECTOMY  06/09/2005   Dr Joya Salm. multiple lumbar level laminectomies.    LEFT AND RIGHT HEART CATHETERIZATION WITH CORONARY ANGIOGRAM N/A 10/01/2013   Procedure: LEFT AND RIGHT HEART CATHETERIZATION WITH CORONARY ANGIOGRAM;  Surgeon: Candee Furbish, MD;  Location: [facility] CATH LAB;  Service: Cardiovascular;  Laterality: N/A;   SPINAL FUSION     lumbar   TEE WITHOUT CARDIOVERSION  11/07/2003   Aoritic valve sclerosis.    TONSILLECTOMY      MEDICATIONS: No current facility-administered medications for this encounter.     acetaminophen (TYLENOL) 500 MG tablet   amLODipine (NORVASC) 5 MG tablet   amoxicillin (AMOXIL) 500 MG capsule   aspirin EC 81 MG EC tablet   atorvastatin (LIPITOR) 20 MG tablet   dextromethorphan-guaiFENesin (MUCINEX DM) 30-600 MG 12hr tablet   lisinopril-hydrochlorothiazide (PRINZIDE,ZESTORETIC) 20-25 MG per tablet   metFORMIN (GLUCOPHAGE) 1000 MG tablet   Multiple Vitamin (MULTIVITAMIN WITH MINERALS) TABS tablet   SF 5000 PLUS 1.1 % CREA dental cream   tamsulosin (FLOMAX) 0.4 MG CAPS capsule   glucose blood test strip   glucose blood test strip    Myra Gianotti, PA-C Surgical Short Stay/Anesthesiology [facility] Phone [phone] [facility] Phone [phone] 06/01/2022 11:20 AM

## 2022-06-02 ENCOUNTER — Encounter (HOSPITAL_COMMUNITY)
Admission: RE | Disposition: A | Discharge status: Home / Self Care | Payer: Self-pay | Source: Home / Self Care | Attending: Pulmonary Disease

## 2022-06-02 ENCOUNTER — Ambulatory Visit (HOSPITAL_BASED_OUTPATIENT_CLINIC_OR_DEPARTMENT_OTHER): Payer: Medicare Other | Admitting: Vascular Surgery

## 2022-06-02 ENCOUNTER — Inpatient Hospital Stay (HOSPITAL_COMMUNITY)
Admission: EM | Admit: 2022-06-02 | Discharge: 2022-06-06 | DRG: 915 | Disposition: A | Discharge status: Home / Self Care | Payer: Medicare Other | Attending: Internal Medicine | Admitting: Internal Medicine

## 2022-06-02 ENCOUNTER — Other Ambulatory Visit: Payer: Self-pay

## 2022-06-02 ENCOUNTER — Ambulatory Visit (HOSPITAL_BASED_OUTPATIENT_CLINIC_OR_DEPARTMENT_OTHER)
Admission: RE | Admit: 2022-06-02 | Discharge: 2022-06-02 | Disposition: A | Discharge status: Home / Self Care | Payer: Medicare Other | Source: Home / Self Care | Attending: Pulmonary Disease | Admitting: Pulmonary Disease

## 2022-06-02 ENCOUNTER — Ambulatory Visit (HOSPITAL_COMMUNITY): Payer: Medicare Other | Admitting: Vascular Surgery

## 2022-06-02 ENCOUNTER — Encounter (HOSPITAL_COMMUNITY): Payer: Self-pay | Admitting: Pulmonary Disease

## 2022-06-02 DIAGNOSIS — C3491 Malignant neoplasm of unspecified part of right bronchus or lung: Secondary | ICD-10-CM | POA: Diagnosis present

## 2022-06-02 DIAGNOSIS — T363X5A Adverse effect of macrolides, initial encounter: Secondary | ICD-10-CM | POA: Diagnosis present

## 2022-06-02 DIAGNOSIS — Z7984 Long term (current) use of oral hypoglycemic drugs: Secondary | ICD-10-CM

## 2022-06-02 DIAGNOSIS — Z953 Presence of xenogenic heart valve: Secondary | ICD-10-CM | POA: Diagnosis not present

## 2022-06-02 DIAGNOSIS — Z833 Family history of diabetes mellitus: Secondary | ICD-10-CM

## 2022-06-02 DIAGNOSIS — E1165 Type 2 diabetes mellitus with hyperglycemia: Secondary | ICD-10-CM | POA: Diagnosis present

## 2022-06-02 DIAGNOSIS — R918 Other nonspecific abnormal finding of lung field: Secondary | ICD-10-CM | POA: Diagnosis not present

## 2022-06-02 DIAGNOSIS — J189 Pneumonia, unspecified organism: Secondary | ICD-10-CM | POA: Diagnosis not present

## 2022-06-02 DIAGNOSIS — Z87891 Personal history of nicotine dependence: Secondary | ICD-10-CM

## 2022-06-02 DIAGNOSIS — E875 Hyperkalemia: Secondary | ICD-10-CM | POA: Diagnosis present

## 2022-06-02 DIAGNOSIS — N179 Acute kidney failure, unspecified: Secondary | ICD-10-CM | POA: Diagnosis present

## 2022-06-02 DIAGNOSIS — Z79899 Other long term (current) drug therapy: Secondary | ICD-10-CM

## 2022-06-02 DIAGNOSIS — Z7982 Long term (current) use of aspirin: Secondary | ICD-10-CM | POA: Diagnosis not present

## 2022-06-02 DIAGNOSIS — Z8249 Family history of ischemic heart disease and other diseases of the circulatory system: Secondary | ICD-10-CM | POA: Diagnosis not present

## 2022-06-02 DIAGNOSIS — Z981 Arthrodesis status: Secondary | ICD-10-CM | POA: Diagnosis not present

## 2022-06-02 DIAGNOSIS — Z86718 Personal history of other venous thrombosis and embolism: Secondary | ICD-10-CM

## 2022-06-02 DIAGNOSIS — C33 Malignant neoplasm of trachea: Secondary | ICD-10-CM | POA: Diagnosis not present

## 2022-06-02 DIAGNOSIS — Z823 Family history of stroke: Secondary | ICD-10-CM

## 2022-06-02 DIAGNOSIS — C771 Secondary and unspecified malignant neoplasm of intrathoracic lymph nodes: Secondary | ICD-10-CM | POA: Diagnosis present

## 2022-06-02 DIAGNOSIS — C3411 Malignant neoplasm of upper lobe, right bronchus or lung: Secondary | ICD-10-CM | POA: Diagnosis not present

## 2022-06-02 DIAGNOSIS — B1920 Unspecified viral hepatitis C without hepatic coma: Secondary | ICD-10-CM | POA: Insufficient documentation

## 2022-06-02 DIAGNOSIS — Z8505 Personal history of malignant neoplasm of liver: Secondary | ICD-10-CM | POA: Diagnosis not present

## 2022-06-02 DIAGNOSIS — E7800 Pure hypercholesterolemia, unspecified: Secondary | ICD-10-CM | POA: Insufficient documentation

## 2022-06-02 DIAGNOSIS — C3490 Malignant neoplasm of unspecified part of unspecified bronchus or lung: Secondary | ICD-10-CM | POA: Insufficient documentation

## 2022-06-02 DIAGNOSIS — I10 Essential (primary) hypertension: Secondary | ICD-10-CM

## 2022-06-02 DIAGNOSIS — B182 Chronic viral hepatitis C: Secondary | ICD-10-CM | POA: Diagnosis present

## 2022-06-02 DIAGNOSIS — T783XXD Angioneurotic edema, subsequent encounter: Secondary | ICD-10-CM | POA: Diagnosis not present

## 2022-06-02 DIAGNOSIS — R590 Localized enlarged lymph nodes: Secondary | ICD-10-CM | POA: Diagnosis not present

## 2022-06-02 DIAGNOSIS — M1990 Unspecified osteoarthritis, unspecified site: Secondary | ICD-10-CM | POA: Insufficient documentation

## 2022-06-02 DIAGNOSIS — C220 Liver cell carcinoma: Secondary | ICD-10-CM | POA: Diagnosis present

## 2022-06-02 DIAGNOSIS — E222 Syndrome of inappropriate secretion of antidiuretic hormone: Secondary | ICD-10-CM | POA: Diagnosis not present

## 2022-06-02 DIAGNOSIS — Y92239 Unspecified place in hospital as the place of occurrence of the external cause: Secondary | ICD-10-CM | POA: Diagnosis present

## 2022-06-02 DIAGNOSIS — Z888 Allergy status to other drugs, medicaments and biological substances status: Secondary | ICD-10-CM

## 2022-06-02 DIAGNOSIS — Y848 Other medical procedures as the cause of abnormal reaction of the patient, or of later complication, without mention of misadventure at the time of the procedure: Secondary | ICD-10-CM | POA: Diagnosis present

## 2022-06-02 DIAGNOSIS — T783XXA Angioneurotic edema, initial encounter: Principal | ICD-10-CM | POA: Diagnosis present

## 2022-06-02 DIAGNOSIS — E119 Type 2 diabetes mellitus without complications: Secondary | ICD-10-CM | POA: Diagnosis not present

## 2022-06-02 DIAGNOSIS — I080 Rheumatic disorders of both mitral and aortic valves: Secondary | ICD-10-CM | POA: Diagnosis present

## 2022-06-02 DIAGNOSIS — J40 Bronchitis, not specified as acute or chronic: Secondary | ICD-10-CM | POA: Diagnosis not present

## 2022-06-02 DIAGNOSIS — R471 Dysarthria and anarthria: Secondary | ICD-10-CM | POA: Diagnosis present

## 2022-06-02 HISTORY — DX: Other specified postprocedural states: R11.2

## 2022-06-02 HISTORY — PX: VIDEO BRONCHOSCOPY WITH ENDOBRONCHIAL ULTRASOUND: SHX6177

## 2022-06-02 HISTORY — DX: Other specified postprocedural states: Z98.890

## 2022-06-02 LAB — BASIC METABOLIC PANEL
Anion gap: 10 (ref 5–15)
BUN: 27 mg/dL — ABNORMAL HIGH (ref 8–23)
CO2: 23 mmol/L (ref 22–32)
Calcium: 9.8 mg/dL (ref 8.9–10.3)
Chloride: 99 mmol/L (ref 98–111)
Creatinine, Ser: 1.25 mg/dL — ABNORMAL HIGH (ref 0.61–1.24)
GFR, Estimated: 59 mL/min — ABNORMAL LOW (ref >60)
Glucose, Bld: 324 mg/dL — ABNORMAL HIGH (ref 70–99)
Potassium: 5.9 mmol/L — ABNORMAL HIGH (ref 3.5–5.1)
Sodium: 132 mmol/L — ABNORMAL LOW (ref 135–145)

## 2022-06-02 LAB — GLUCOSE, CAPILLARY
Glucose-Capillary: 96 mg/dL (ref 70–99)
Glucose-Capillary: 109 mg/dL — ABNORMAL HIGH (ref 70–99)

## 2022-06-02 LAB — CBC
HCT: 30.9 % — ABNORMAL LOW (ref 39.0–52.0)
Hemoglobin: 9.9 g/dL — ABNORMAL LOW (ref 13.0–17.0)
MCH: 25.1 pg — ABNORMAL LOW (ref 26.0–34.0)
MCHC: 32 g/dL (ref 30.0–36.0)
MCV: 78.4 fL — ABNORMAL LOW (ref 80.0–100.0)
Platelets: 378 10*3/uL (ref 150–400)
RBC: 3.94 MIL/uL — ABNORMAL LOW (ref 4.22–5.81)
RDW: 17.1 % — ABNORMAL HIGH (ref 11.5–15.5)
WBC: 8.8 10*3/uL (ref 4.0–10.5)
nRBC: 0 % (ref 0.0–0.2)

## 2022-06-02 SURGERY — BRONCHOSCOPY, WITH EBUS
Anesthesia: General

## 2022-06-02 MED ORDER — ACETAMINOPHEN 500 MG PO TABS
1000.0000 mg | ORAL_TABLET | Freq: Once | ORAL | Status: AC
  Administered 2022-06-02: 1000 mg via ORAL
  Filled 2022-06-02: qty 2

## 2022-06-02 MED ORDER — FENTANYL CITRATE (PF) 100 MCG/2ML IJ SOLN: 25.0000 ug | INTRAMUSCULAR | Status: DC | PRN

## 2022-06-02 MED ORDER — FENTANYL CITRATE (PF) 250 MCG/5ML IJ SOLN
INTRAMUSCULAR | Status: DC | PRN
  Administered 2022-06-02: 50 ug via INTRAVENOUS

## 2022-06-02 MED ORDER — DEXAMETHASONE SODIUM PHOSPHATE 10 MG/ML IJ SOLN
INTRAMUSCULAR | Status: AC
  Filled 2022-06-02: qty 1

## 2022-06-02 MED ORDER — C1 ESTERASE INHIBITOR (HUMAN) 500 UNITS IV KIT
20.0000 [IU]/kg | PACK | Freq: Once | INTRAVENOUS | Status: AC
  Administered 2022-06-02: 1000 [IU] via INTRAVENOUS
  Filled 2022-06-02: qty 1000

## 2022-06-02 MED ORDER — ROCURONIUM BROMIDE 10 MG/ML (PF) SYRINGE
PREFILLED_SYRINGE | INTRAVENOUS | Status: DC | PRN
  Administered 2022-06-02: 60 mg via INTRAVENOUS

## 2022-06-02 MED ORDER — TRANEXAMIC ACID-NACL 1000-0.7 MG/100ML-% IV SOLN
1000.0000 mg | Freq: Once | INTRAVENOUS | Status: AC
  Administered 2022-06-02: 1000 mg via INTRAVENOUS

## 2022-06-02 MED ORDER — DEXAMETHASONE SODIUM PHOSPHATE 10 MG/ML IJ SOLN
INTRAMUSCULAR | Status: DC | PRN
  Administered 2022-06-02: 10 mg via INTRAVENOUS

## 2022-06-02 MED ORDER — EPINEPHRINE 0.3 MG/0.3ML IJ SOAJ
0.3000 mg | Freq: Once | INTRAMUSCULAR | Status: AC
  Administered 2022-06-02: 0.3 mg via INTRAMUSCULAR

## 2022-06-02 MED ORDER — ROCURONIUM BROMIDE 10 MG/ML (PF) SYRINGE
PREFILLED_SYRINGE | INTRAVENOUS | Status: AC
  Filled 2022-06-02: qty 10

## 2022-06-02 MED ORDER — FENTANYL CITRATE (PF) 250 MCG/5ML IJ SOLN
INTRAMUSCULAR | Status: AC
  Filled 2022-06-02: qty 5

## 2022-06-02 MED ORDER — EPINEPHRINE 0.3 MG/0.3ML IJ SOAJ: 0.3000 mg | Freq: Once | INTRAMUSCULAR | Status: DC

## 2022-06-02 MED ORDER — LIDOCAINE 2% (20 MG/ML) 5 ML SYRINGE
INTRAMUSCULAR | Status: DC | PRN
  Administered 2022-06-02: 20 mg via INTRAVENOUS

## 2022-06-02 MED ORDER — PROPOFOL 10 MG/ML IV BOLUS
INTRAVENOUS | Status: AC
  Filled 2022-06-02: qty 20

## 2022-06-02 MED ORDER — EPINEPHRINE PF 1 MG/ML IJ SOLN
INTRAMUSCULAR | Status: AC
  Filled 2022-06-02: qty 1

## 2022-06-02 MED ORDER — LIDOCAINE 2% (20 MG/ML) 5 ML SYRINGE
INTRAMUSCULAR | Status: AC
  Filled 2022-06-02: qty 5

## 2022-06-02 MED ORDER — ONDANSETRON HCL 4 MG/2ML IJ SOLN
INTRAMUSCULAR | Status: DC | PRN
  Administered 2022-06-02: 4 mg via INTRAVENOUS

## 2022-06-02 MED ORDER — EPINEPHRINE HCL 5 MG/250ML IV SOLN IN NS
0.5000 ug/min | INTRAVENOUS | Status: DC
  Administered 2022-06-02: 0.5 ug/min via INTRAVENOUS

## 2022-06-02 MED ORDER — MIDAZOLAM HCL 2 MG/2ML IJ SOLN: 0.5000 mg | Freq: Once | INTRAMUSCULAR | Status: DC | PRN

## 2022-06-02 MED ORDER — SUGAMMADEX SODIUM 200 MG/2ML IV SOLN
INTRAVENOUS | Status: DC | PRN
  Administered 2022-06-02: 119.8 mg via INTRAVENOUS

## 2022-06-02 MED ORDER — DIPHENHYDRAMINE HCL 50 MG/ML IJ SOLN
50.0000 mg | Freq: Once | INTRAMUSCULAR | Status: AC
  Administered 2022-06-02: 50 mg via INTRAVENOUS
  Filled 2022-06-02: qty 1

## 2022-06-02 MED ORDER — PROPOFOL 10 MG/ML IV BOLUS
INTRAVENOUS | Status: DC | PRN
  Administered 2022-06-02: 150 mg via INTRAVENOUS
  Administered 2022-06-02: 30 mg via INTRAVENOUS

## 2022-06-02 MED ORDER — EPINEPHRINE 0.3 MG/0.3ML IJ SOAJ
0.3000 mg | Freq: Once | INTRAMUSCULAR | Status: AC
  Administered 2022-06-02: 0.3 mg via INTRAMUSCULAR
  Filled 2022-06-02: qty 0.3

## 2022-06-02 MED ORDER — AZITHROMYCIN 250 MG PO TABS: ORAL_TABLET | ORAL | 0 refills | Status: DC

## 2022-06-02 MED ORDER — SODIUM CHLORIDE 0.9 % IV SOLN: INTRAVENOUS | Status: DC

## 2022-06-02 MED ORDER — LACTATED RINGERS IV SOLN: INTRAVENOUS | Status: DC

## 2022-06-02 MED ORDER — METHYLPREDNISOLONE SODIUM SUCC 125 MG IJ SOLR
125.0000 mg | Freq: Once | INTRAMUSCULAR | Status: AC
  Administered 2022-06-02: 125 mg via INTRAVENOUS
  Filled 2022-06-02: qty 2

## 2022-06-02 MED ORDER — CHLORHEXIDINE GLUCONATE 0.12 % MT SOLN
15.0000 mL | OROMUCOSAL | Status: AC
  Administered 2022-06-02: 15 mL via OROMUCOSAL
  Filled 2022-06-02 (×2): qty 15

## 2022-06-02 MED ORDER — ONDANSETRON HCL 4 MG/2ML IJ SOLN
INTRAMUSCULAR | Status: AC
  Filled 2022-06-02: qty 2

## 2022-06-02 MED ORDER — PROMETHAZINE HCL 25 MG/ML IJ SOLN: 6.2500 mg | INTRAMUSCULAR | Status: DC | PRN

## 2022-06-02 MED ORDER — FAMOTIDINE IN NACL 20-0.9 MG/50ML-% IV SOLN
20.0000 mg | Freq: Once | INTRAVENOUS | Status: AC
  Administered 2022-06-02: 20 mg via INTRAVENOUS
  Filled 2022-06-02: qty 50

## 2022-06-02 MED ORDER — OXYCODONE HCL 5 MG PO TABS: 5.0000 mg | ORAL_TABLET | Freq: Once | ORAL | Status: DC | PRN

## 2022-06-02 MED ORDER — OXYCODONE HCL 5 MG/5ML PO SOLN: 5.0000 mg | Freq: Once | ORAL | Status: DC | PRN

## 2022-06-02 SURGICAL SUPPLY — 29 items
BALLN FOR EBUS SCOPE (BALLOONS) ×1
BALLOON FOR EBUS SCOPE (BALLOONS) ×1 IMPLANT
BALN ESCP STRL LXBF-UC160F (BALLOONS) ×1
BRUSH CYTOL CELLEBRITY 1.5X140 (MISCELLANEOUS) IMPLANT
CANISTER SUCT 3000ML PPV (MISCELLANEOUS) ×1 IMPLANT
CNTNR URN SCR LID CUP LEK RST (MISCELLANEOUS) ×1 IMPLANT
CONT SPEC 4OZ STRL OR WHT (MISCELLANEOUS) ×1
COVER BACK TABLE 60X90IN (DRAPES) ×1 IMPLANT
GAUZE SPONGE 4X4 12PLY STRL (GAUZE/BANDAGES/DRESSINGS) ×1 IMPLANT
GLOVE BIO SURGEON STRL SZ 6.5 (GLOVE) ×1 IMPLANT
GOWN STRL REUS W/ TWL LRG LVL3 (GOWN DISPOSABLE) ×1 IMPLANT
GOWN STRL REUS W/TWL LRG LVL3 (GOWN DISPOSABLE) ×2
KIT TURNOVER KIT B (KITS) ×1 IMPLANT
MARKER SKIN DUAL TIP RULER LAB (MISCELLANEOUS) ×1 IMPLANT
NDL ASPIRATION VIZISHOT 19G (NEEDLE) IMPLANT
NDL ASPIRATION VIZISHOT 21G (NEEDLE) ×1 IMPLANT
NEEDLE ASPIRATION VIZISHOT 19G (NEEDLE) ×1 IMPLANT
NEEDLE ASPIRATION VIZISHOT 21G (NEEDLE) ×2 IMPLANT
NS IRRIG 1000ML POUR BTL (IV SOLUTION) ×1 IMPLANT
OIL SILICONE PENTAX (PARTS (SERVICE/REPAIRS)) ×1 IMPLANT
PAD ARMBOARD 7.5X6 YLW CONV (MISCELLANEOUS) ×2 IMPLANT
SYR 20CC LL (SYRINGE) IMPLANT
SYR 20ML ECCENTRIC (SYRINGE) ×2 IMPLANT
SYR 20ML LL LF (SYRINGE) ×2 IMPLANT
TOWEL GREEN STERILE FF (TOWEL DISPOSABLE) ×1 IMPLANT
TRAP SPECIMEN MUCUS 40CC (MISCELLANEOUS) IMPLANT
TUBE CONNECTING 20X1/4 (TUBING) ×2 IMPLANT
UNDERPAD 30X36 HEAVY ABSORB (UNDERPADS AND DIAPERS) ×1 IMPLANT
WATER STERILE IRR 1000ML POUR (IV SOLUTION) ×1 IMPLANT

## 2022-06-02 NOTE — Transfer of Care (Signed)
Immediate Anesthesia Transfer of Care Note  Patient: [name]  Procedure(s) Performed: VIDEO BRONCHOSCOPY WITH ENDOBRONCHIAL ULTRASOUND  Patient Location: PACU  Anesthesia Type:General  Level of Consciousness: awake and alert   Airway & Oxygen Therapy: Patient Spontanous Breathing and Patient connected to face mask oxygen  Post-op Assessment: Report given to RN and Post -op Vital signs reviewed and stable  Post vital signs: Reviewed and stable  Last Vitals:  Vitals Value Taken Time  BP 140/64 06/02/22 1545  Temp 37.2 C 06/02/22 1515  Pulse 72 06/02/22 1549  Resp 16 06/02/22 1549  SpO2 100 % 06/02/22 1549  Vitals shown include unvalidated device data.  Last Pain:  Vitals:   06/02/22 1545  TempSrc:   PainSc: 0-No pain      Patients Stated Pain Goal: 0 (29/56/21 3086)  Complications: No notable events documented.

## 2022-06-02 NOTE — Anesthesia Postprocedure Evaluation (Signed)
Anesthesia Post Note  Patient: [name]  Procedure(s) Performed: VIDEO BRONCHOSCOPY WITH ENDOBRONCHIAL ULTRASOUND     Patient location during evaluation: PACU Anesthesia Type: General Level of consciousness: awake and alert, patient cooperative and oriented Pain management: pain level controlled Vital Signs Assessment: post-procedure vital signs reviewed and stable Respiratory status: spontaneous breathing, nonlabored ventilation and respiratory function stable Cardiovascular status: blood pressure returned to baseline and stable Postop Assessment: no apparent nausea or vomiting Anesthetic complications: no   No notable events documented.  Last Vitals:  Vitals:   06/02/22 1530 06/02/22 1545  BP: 135/67 (!) 140/64  Pulse: 81 76  Resp: 14 18  Temp:    SpO2: 98% 98%    Last Pain:  Vitals:   06/02/22 1545  TempSrc:   PainSc: 0-No pain                 Forest Pruden,E. [place]

## 2022-06-02 NOTE — Op Note (Addendum)
Video Bronchoscopy Procedure Note  Date of Operation: 06/02/2022  Pre-op Diagnosis: Right lung mass/paratracheal mass, mediastinal adenopathy  Post-op Diagnosis: Same. Addendum 06/06/22. Squamous cell carcinoma of the lung  Surgeon: Rodman Pickle, MD  Assistants: none  Anesthesia: General anesthesia  Operation: Endobronchial ultrasound and fiberoptic bronchoscopy and biopsies.  Estimated Blood Loss: Minimal  Complications: none noted  Indications and History: [name] is 82 y.o. with history of right lung mass.  Recommendation was to perform video fiberoptic bronchoscopy with endobronchial ultrasound with biopsies. The risks, benefits, complications, treatment options and expected outcomes were discussed with the patient.  The possibilities of pneumothorax, pneumonia, reaction to medication, pulmonary aspiration, perforation of a viscus, bleeding, failure to diagnose a condition and creating a complication requiring transfusion or operation were discussed with the patient who freely signed the consent.    Description of Procedure: The patient was seen in the Preoperative Area, was examined and was deemed appropriate to proceed.  The patient was taken to OR 10, identified as [name] and the procedure verified as Flexible Video Fiberoptic Bronchoscopy with endobronchial ultrasound with tissue sampling.  A Time Out was held and the above information confirmed.   The fiberoptic bronchoscope was introduced via endotracheal tube and a general inspection was performed which showed normal trachea, normal main carina. The R sided airways were inspected and showed normal RUL, BI, RML and RLL. The L side was then inspected. The LLL, Lingular and LUL airways were normal.   Wang needle bx, endobronchial brushings, endobronchial forceps biopsies were performed. There was some initial moderate bleeding that stopped quickly. BAL performed in the RUL and brushings x3 in the right trachea to be  sent for cytology. Needle aspirations collected from right upper lobe mass/paratracheal mass x8. New needle changed to sample station 7 followed by 4R.  The patient tolerated the procedure well. The bronchoscope was removed. There were no obvious complications.   Samples: 1. Endobronchial brushings from right trachea 2. Wang needle biopsies from right upper lobe mass/paratracheal mass x 8, station 7 x 3and station 4R x 4. Cell block collected for each site.  3. Bronchial washings from RUL  Plans:  We will review the cytology, pathology and microbiology results with the patient when they become available.  Outpatient followup will be with Dr Kary Kos, MD 06/02/2022, 3:08 PM [facility]

## 2022-06-02 NOTE — ED Provider Notes (Incomplete)
[facility] Provider Note   CSN: [id number] Arrival date & time: 06/02/22  2219     History {Add pertinent medical, surgical, social history, OB history to HPI:1} Chief Complaint  Patient presents with  . Angioedema ( Tongue)     [name] is a 82 y.o. male with past medical history significant for diabetes, hep C, hypertension, hyperlipidemia, aortic stenosis who presents with concern for sudden right-sided tongue swelling this evening which began after waking up from a nap.  Patient had a bronchoscopy with bronchial biopsy earlier today, had taken 1 dose of new antibiotic erythromycin right before he laid down for his nap.  He does take lisinopril hydrochlorothiazide, however reports that he has not had a dose for 1 to 2 days.  He denies any family history of similar reaction.  He is unsure whether the swelling is continuing to worsen.  Currently with unlabored breathing.  HPI     Home Medications Prior to Admission medications   Medication Sig Start Date End Date Taking? Authorizing Provider  acetaminophen (TYLENOL) 500 MG tablet Take 500 mg by mouth every 6 (six) hours as needed for mild pain or moderate pain.    [provider]  amLODipine (NORVASC) 5 MG tablet Take 5 mg by mouth daily. 04/13/22   [provider]  aspirin EC 81 MG EC tablet Take 1 tablet (81 mg total) by mouth daily. 11/24/13   Gold, Wayne E, PA-C  atorvastatin (LIPITOR) 20 MG tablet Take 20 mg by mouth daily.    [provider]  azithromycin (ZITHROMAX Z-PAK) 250 MG tablet Take two tablets on day 1, then one tablet daily on day 2-5. 06/02/22   Margaretha Seeds, MD  dextromethorphan-guaiFENesin [facility] DM) 30-600 MG 12hr tablet Take 1 tablet by mouth 2 (two) times daily as needed (congestion).    [provider]  glucose blood test strip  01/15/14   [provider]  glucose blood test strip as directed DX 250 01/15/14    [provider]  lisinopril-hydrochlorothiazide (PRINZIDE,ZESTORETIC) 20-25 MG per tablet Take 1 tablet by mouth daily.    [provider]  metFORMIN (GLUCOPHAGE) 1000 MG tablet Take 1,000 mg by mouth 2 (two) times daily with a meal.    [provider]  Multiple Vitamin (MULTIVITAMIN WITH MINERALS) TABS tablet Take 1 tablet by mouth daily.    [provider]  SF 5000 PLUS 1.1 % CREA dental cream Place 1 Application onto teeth daily. 02/20/18   [provider]  tamsulosin (FLOMAX) 0.4 MG CAPS capsule Take 0.4 mg by mouth daily. 12/24/19   [provider]      Allergies    Ezetimibe-simvastatin, Metoprolol, and Rosuvastatin    Review of Systems   Review of Systems  All other systems reviewed and are negative.   Physical Exam Updated Vital Signs BP (!) 170/83 (BP Location: Right Arm)   Pulse 83   Temp 98.4 F (36.9 C) (Oral)   Resp 18   SpO2 99%  Physical Exam  ED Results / Procedures / Treatments   Labs (all labs ordered are listed, but only abnormal results are displayed) Labs Reviewed  CBC  BASIC METABOLIC PANEL    EKG None  Radiology No results found.  Procedures Procedures  {Document cardiac monitor, telemetry assessment procedure when appropriate:1}  Medications Ordered in ED Medications  famotidine (PEPCID) IVPB 20 mg premix (has no administration in time range)  EPINEPHrine (EPI-PEN) injection  0.3 mg (0.3 mg Intramuscular Given 06/02/22 2238)  diphenhydrAMINE (BENADRYL) injection 50 mg (50 mg Intravenous Given 06/02/22 2237)  methylPREDNISolone sodium succinate (SOLU-MEDROL) 125 mg/2 mL injection 125 mg (125 mg Intravenous Given 06/02/22 2238)    ED Course/ Medical Decision Making/ A&P   {   Click here for ABCD2, HEART and other calculatorsREFRESH Note before signing :1}                          Medical Decision Making Amount and/or Complexity of Data Reviewed Labs: ordered.  Risk Prescription drug  management.   ***  {Document critical care time when appropriate:1} {Document review of labs and clinical decision tools ie heart score, Chads2Vasc2 etc:1}  {Document your independent review of radiology images, and any outside records:1} {Document your discussion with family members, caretakers, and with consultants:1} {Document social determinants of health affecting pt's care:1} {Document your decision making why or why not admission, treatments were needed:1} Final Clinical Impression(s) / ED Diagnoses Final diagnoses:  None    Rx / DC Orders ED Discharge Orders     None

## 2022-06-02 NOTE — Anesthesia Procedure Notes (Signed)
Procedure Name: Intubation Date/Time: 06/02/2022 1:25 PM  Performed by: Lorie Phenix, CRNAPre-anesthesia Checklist: Patient identified, Emergency Drugs available, Suction available and Patient being monitored Patient Re-evaluated:Patient Re-evaluated prior to induction Oxygen Delivery Method: Circle system utilized Preoxygenation: Pre-oxygenation with 100% oxygen Induction Type: IV induction Ventilation: Mask ventilation without difficulty Laryngoscope Size: Mac and 4 Grade View: Grade I Tube type: Oral Tube size: 8.5 mm Number of attempts: 1 Airway Equipment and Method: Stylet Placement Confirmation: ETT inserted through vocal cords under direct vision, positive ETCO2 and breath sounds checked- equal and bilateral Secured at: 22 cm Tube secured with: Tape Dental Injury: Teeth and Oropharynx as per pre-operative assessment  Comments: Intubation performed by Jeremy Johann, paramedic student, under direct supervision of MDA and CRNA

## 2022-06-02 NOTE — ED Provider Triage Note (Signed)
Emergency Medicine Provider Triage Evaluation Note  [name] , a 82 y.o. male  was evaluated in triage.  Pt complains of sudden onset right-sided tongue swelling that began this evening.  He reports some increased difficulty swallowing but no difficulty breathing.  Patient had bronchoscopy, bronchial biopsy this afternoon, was placed on erythromycin, had taken his first dose this afternoon prior to swelling beginning.  Patient does take lisinopril hydrochlorothiazide as well, but reports that he has not taken lisinopril for 1 day.  Review of Systems  Positive: Tongue swelling Negative: Shortness of breath, respiratory distress  Physical Exam  BP (!) 170/83 (BP Location: Right Arm)   Pulse 83   Temp 98.4 F (36.9 C) (Oral)   Resp 18   SpO2 99%  Gen:   Awake, no distress   Resp:  Normal effort  MSK:   Moves extremities without difficulty  Other:  Significant right tongue swelling consistent with angioedema noted, no stridor, respiratory distress on my exam  Medical Decision Making  Medically screening exam initiated at 10:33 PM.  Appropriate orders placed.  [name] was informed that the remainder of the evaluation will be completed by another provider, this initial triage assessment does not replace that evaluation, and the importance of remaining in the ED until their evaluation is complete.  Workup initiated in triage    Anselmo Pickler, [place] 06/02/22 2236

## 2022-06-02 NOTE — ED Triage Notes (Addendum)
Patient reports sudden tongue swelling this evening , airway intact/respirations unlabored , no stridor , patient added bronchoscopy/bronchial biopsy this afternoon and MD started him on an antibiotic .

## 2022-06-02 NOTE — ED Provider Notes (Signed)
[facility] Provider Note   CSN: [id number] Arrival date & time: 06/02/22  2219     History  Chief Complaint  Patient presents with   Angioedema ( Tongue)     [name] is a 82 y.o. male with past medical history significant for diabetes, hep C, hypertension, hyperlipidemia, aortic stenosis who presents with concern for sudden right-sided tongue swelling this evening which began after waking up from a nap.  Patient had a bronchoscopy with bronchial biopsy earlier today, had taken 1 dose of new antibiotic erythromycin right before he laid down for his nap.  He does take lisinopril hydrochlorothiazide, however reports that he has not had a dose for 1 to 2 days.  He denies any family history of similar reaction.  He is unsure whether the swelling is continuing to worsen.  Currently with unlabored breathing.  HPI     Home Medications Prior to Admission medications   Medication Sig Start Date End Date Taking? Authorizing Provider  acetaminophen (TYLENOL) 500 MG tablet Take 500 mg by mouth every 6 (six) hours as needed for mild pain or moderate pain.    [provider]  amLODipine (NORVASC) 5 MG tablet Take 5 mg by mouth daily. 04/13/22   [provider]  aspirin EC 81 MG EC tablet Take 1 tablet (81 mg total) by mouth daily. 11/24/13   Gold, Wayne E, PA-C  atorvastatin (LIPITOR) 20 MG tablet Take 20 mg by mouth daily.    [provider]  dextromethorphan-guaiFENesin (MUCINEX DM) 30-600 MG 12hr tablet Take 1 tablet by mouth 2 (two) times daily as needed (congestion).    [provider]  glucose blood test strip  01/15/14   [provider]  glucose blood test strip as directed DX 250 01/15/14   [provider]  lisinopril-hydrochlorothiazide (PRINZIDE,ZESTORETIC) 20-25 MG per tablet Take 1 tablet by mouth daily.    [provider]  metFORMIN (GLUCOPHAGE) 1000 MG tablet Take 1,000 mg by  mouth 2 (two) times daily with a meal.    [provider]  Multiple Vitamin (MULTIVITAMIN WITH MINERALS) TABS tablet Take 1 tablet by mouth daily.    [provider]  SF 5000 PLUS 1.1 % CREA dental cream Place 1 Application onto teeth daily. 02/20/18   [provider]  tamsulosin (FLOMAX) 0.4 MG CAPS capsule Take 0.4 mg by mouth daily. 12/24/19   [provider]      Allergies    Azithromycin, Ezetimibe-simvastatin, Metoprolol, and Rosuvastatin    Review of Systems   Review of Systems  HENT:  Positive for facial swelling.   All other systems reviewed and are negative.   Physical Exam Updated Vital Signs BP (!) 155/78   Pulse 74   Temp 98.4 F (36.9 C) (Oral)   Resp 14   SpO2 99%  Physical Exam Vitals and nursing note reviewed.  Constitutional:      General: He is not in acute distress.    Appearance: Normal appearance.  HENT:     Head: Normocephalic and atraumatic.     Mouth/Throat:     Comments: Patient with significant right-sided tongue swelling, but is able to close his mouth, he has no stridor or difficulty breathing.  Posterior oropharynx is without significant edema.  No significant lip swelling.  On reassessment in the first 30 minutes after patient's arrival patient with increased swelling of tongue, no longer able to sit comfortably with his mouth closed.  He continues to have no stridor or difficulty breathing.  Posterior oropharynx remains noncrowded.  Tonsils 1+, uvula midline. Eyes:     General:        Right eye: No discharge.        Left eye: No discharge.  Cardiovascular:     Rate and Rhythm: Normal rate and regular rhythm.     Heart sounds: No murmur heard.    No friction rub. No gallop.  Pulmonary:     Effort: Pulmonary effort is normal.     Breath sounds: Normal breath sounds.  Abdominal:     General: Bowel sounds are normal.     Palpations: Abdomen is soft.  Skin:    General: Skin is warm and dry.     Capillary  Refill: Capillary refill takes less than 2 seconds.  Neurological:     Mental Status: He is alert and oriented to person, place, and time.  Psychiatric:        Mood and Affect: Mood normal.        Behavior: Behavior normal.     ED Results / Procedures / Treatments   Labs (all labs ordered are listed, but only abnormal results are displayed) Labs Reviewed  CBC - Abnormal; Notable for the following components:      Result Value   RBC 3.94 (*)    Hemoglobin 9.9 (*)    HCT 30.9 (*)    MCV 78.4 (*)    MCH 25.1 (*)    RDW 17.1 (*)    All other components within normal limits  BASIC METABOLIC PANEL - Abnormal; Notable for the following components:   Sodium 132 (*)    Potassium 5.9 (*)    Glucose, Bld 324 (*)    BUN 27 (*)    Creatinine, Ser 1.25 (*)    GFR, Estimated 59 (*)    All other components within normal limits  CBC  CBC  BASIC METABOLIC PANEL  MAGNESIUM  PHOSPHORUS  POTASSIUM  HEMOGLOBIN A1C    EKG None  Radiology No results found.  Procedures .Critical Care  Performed by: Anselmo Pickler, PA-C Authorized by: Anselmo Pickler, PA-C   Critical care provider statement:    Critical care time (minutes):  75   Critical care was necessary to treat or prevent imminent or life-threatening deterioration of the following conditions:  Respiratory failure (Angioedema, threatened respiratory compromise)   Critical care was time spent personally by me on the following activities:  Development of treatment plan with patient or surrogate, discussions with consultants, evaluation of patient's response to treatment, examination of patient, ordering and review of laboratory studies, ordering and review of radiographic studies, ordering and performing treatments and interventions, pulse oximetry, re-evaluation of patient's condition and review of old charts   Care discussed with: admitting provider       Medications Ordered in ED Medications  0.9 %  sodium chloride  infusion ( Intravenous New Bag/Given 06/02/22 2337)  EPINEPHrine (ADRENALIN) 5 mg in NS 250 mL (0.02 mg/mL) premix infusion (0.5 mcg/min Intravenous New Bag/Given 06/02/22 2343)  docusate sodium (COLACE) capsule 100 mg (has no administration in time range)  polyethylene glycol (MIRALAX / GLYCOLAX) packet 17 g (has no administration in time range)  enoxaparin (LOVENOX) injection 40 mg (has no administration in time range)  methylPREDNISolone sodium succinate (SOLU-MEDROL) 125 mg/2 mL injection 60 mg (has no administration in time range)  insulin aspart (novoLOG) injection 0-9 Units (has no administration in time range)  EPINEPHrine (  EPI-PEN) injection 0.3 mg (0.3 mg Intramuscular Given 06/02/22 2238)  diphenhydrAMINE (BENADRYL) injection 50 mg (50 mg Intravenous Given 06/02/22 2237)  methylPREDNISolone sodium succinate (SOLU-MEDROL) 125 mg/2 mL injection 125 mg (125 mg Intravenous Given 06/02/22 2238)  famotidine (PEPCID) IVPB 20 mg premix (0 mg Intravenous Stopped 06/02/22 2327)  EPINEPHrine (EPI-PEN) injection 0.3 mg (0.3 mg Intramuscular Given 06/02/22 2316)  tranexamic acid (CYKLOKAPRON) IVPB 1,000 mg (0 mg Intravenous Stopped 06/02/22 2343)  C1 esterase inhibitor (Human) (BERINERT) injection 1,000 Units (1,000 Units Intravenous Given 06/02/22 2359)    ED Course/ Medical Decision Making/ A&P Clinical Course as of 06/03/22 0223  Thu Jun 02, 2022  2259 Contacting critical care to discuss intubation for high risk airway compromise with angioedema [CP]  Fri Jun 03, 2022  [place] Gleason PA with CC called for admission for both this patient and room 12 [CP]    Clinical Course User Index [CP] Anselmo Pickler, PA-C                             Medical Decision Making Amount and/or Complexity of Data Reviewed Labs: ordered.  Risk Prescription drug management. Decision regarding hospitalization.   This patient is a 82 y.o. male who presents to the ED for concern of new onset tongue  swelling, mouth swelling, this involves an extensive number of treatment options, and is a complaint that carries with it a high risk of complications and morbidity. The emergent differential diagnosis prior to evaluation includes, but is not limited to, considered infection, allergic angioedema, hereditary angioedema, ACE induced angioedema, versus other ENT emergency, however clinically patient with fairly clear appearance of angioedema on the right side of the tongue. This is not an exhaustive differential.   Past Medical History / Co-morbidities / Social History: Hypertension, diabetes, hyperlipidemia, previous DVT  Additional history: Chart reviewed. Pertinent results include: Reviewed recent procedural note from today from bronchoscopy with bronchial biopsy  Physical Exam: Physical exam performed. The pertinent findings include: Patient with significant right sided angioedema of tongue, no swelling of lips, throat  Lab Tests: I ordered, and personally interpreted labs.  The pertinent results include: Notable CBC with anemia, stable compared to baseline, no significant leukocytosis.  BMP with mild hyponatremia, sodium 132, moderate hyperkalemia, potassium 5.9.  Glucose elevated at 324, BUN, creatinine elevated 27, 1.25 respectively.  Kidney function increased from baseline around 1.  Medications: I ordered medication including C1 esterase, tranexamic acid, Pepcid, epinephrine x 2, Solu-Medrol, Benadryl, epi and sodium chloride infusion for acute angioedema.  Frequent reevaluation showed angioedema of tongue was worsening over patient's first 30 minutes in the emergency department but then stabilized, with no ongoing worsening, no posterior oropharynx swelling,  Consultations Obtained: I requested consultation with the anesthesiologist, ENT, spoke with Dr. Janace Hoard, as well as critical care, spoke with Elwyn Reach, PA,  and discussed lab and imaging findings as well as pertinent plan - they  recommend: Patient was evaluated onsite by anesthesiology as well as ENT, with continuous one-to-one monitoring for several minutes, and extensive discussion on advanced airway, ET tube, surgical airway management, versus conservative monitoring in the ICU.  Ultimately with no ongoing signs of worsening angioedema, stable oxygen on room air, clear posterior oropharynx, no respiratory distress the decision was made to forego advanced airway at this time, patient will be admitted to the ICU on epi drip for monitoring.   Disposition: After consideration of the diagnostic results and the patients response  to treatment, I feel that patient's symptoms consistent with acute angioedema, likely secondary to the erythromycin that he took earlier today.  He would benefit from admission at this time..   I discussed this case with my attending physician Dr. Mayra Neer who cosigned this note including patient's presenting symptoms, physical exam, and planned diagnostics and interventions. Attending physician stated agreement with plan or made changes to plan which were implemented.    Dr. Mayra Neer helped extensively with onsite management of this patient and decision making, please see her note for more extensive details. Final Clinical Impression(s) / ED Diagnoses Final diagnoses:  Angioedema, initial encounter    Rx / DC Orders ED Discharge Orders     None         Anselmo Pickler, PA-C 06/03/22 0223    Audley Hose, MD 06/05/22 2241

## 2022-06-02 NOTE — Interval H&P Note (Deleted)
History and Physical Interval Note:  06/02/2022 1:12 PM  [name]  has presented today for surgery, with the diagnosis of MASS OF RIGHT UPPER LOBE.  The various methods of treatment have been discussed with the patient and family. After consideration of risks, benefits and other options for treatment, the patient has consented to  Procedure(s): [place] Walk (N/A) as a surgical intervention.  The patient's history has been reviewed, patient examined, no change in status, stable for surgery.  I have reviewed the patient's chart and labs.  Questions were answered to the patient's satisfaction.     Willy Pinkerton Rodman Pickle, MD

## 2022-06-03 ENCOUNTER — Encounter (HOSPITAL_COMMUNITY): Payer: Self-pay | Admitting: Pulmonary Disease

## 2022-06-03 DIAGNOSIS — Y92239 Unspecified place in hospital as the place of occurrence of the external cause: Secondary | ICD-10-CM | POA: Diagnosis present

## 2022-06-03 DIAGNOSIS — Z953 Presence of xenogenic heart valve: Secondary | ICD-10-CM | POA: Diagnosis not present

## 2022-06-03 DIAGNOSIS — J189 Pneumonia, unspecified organism: Secondary | ICD-10-CM | POA: Diagnosis not present

## 2022-06-03 DIAGNOSIS — T783XXA Angioneurotic edema, initial encounter: Secondary | ICD-10-CM | POA: Diagnosis present

## 2022-06-03 DIAGNOSIS — Z8505 Personal history of malignant neoplasm of liver: Secondary | ICD-10-CM | POA: Diagnosis not present

## 2022-06-03 DIAGNOSIS — E222 Syndrome of inappropriate secretion of antidiuretic hormone: Secondary | ICD-10-CM | POA: Diagnosis not present

## 2022-06-03 DIAGNOSIS — Z7982 Long term (current) use of aspirin: Secondary | ICD-10-CM | POA: Diagnosis not present

## 2022-06-03 DIAGNOSIS — E7800 Pure hypercholesterolemia, unspecified: Secondary | ICD-10-CM | POA: Diagnosis present

## 2022-06-03 DIAGNOSIS — Z981 Arthrodesis status: Secondary | ICD-10-CM | POA: Diagnosis not present

## 2022-06-03 DIAGNOSIS — J40 Bronchitis, not specified as acute or chronic: Secondary | ICD-10-CM | POA: Diagnosis not present

## 2022-06-03 DIAGNOSIS — N179 Acute kidney failure, unspecified: Secondary | ICD-10-CM | POA: Diagnosis present

## 2022-06-03 DIAGNOSIS — I080 Rheumatic disorders of both mitral and aortic valves: Secondary | ICD-10-CM | POA: Diagnosis present

## 2022-06-03 DIAGNOSIS — T363X5A Adverse effect of macrolides, initial encounter: Secondary | ICD-10-CM | POA: Diagnosis present

## 2022-06-03 DIAGNOSIS — E1165 Type 2 diabetes mellitus with hyperglycemia: Secondary | ICD-10-CM | POA: Diagnosis present

## 2022-06-03 DIAGNOSIS — Z87891 Personal history of nicotine dependence: Secondary | ICD-10-CM | POA: Diagnosis not present

## 2022-06-03 DIAGNOSIS — T783XXD Angioneurotic edema, subsequent encounter: Secondary | ICD-10-CM | POA: Diagnosis not present

## 2022-06-03 DIAGNOSIS — I10 Essential (primary) hypertension: Secondary | ICD-10-CM | POA: Diagnosis present

## 2022-06-03 DIAGNOSIS — Z7984 Long term (current) use of oral hypoglycemic drugs: Secondary | ICD-10-CM | POA: Diagnosis not present

## 2022-06-03 DIAGNOSIS — Z86718 Personal history of other venous thrombosis and embolism: Secondary | ICD-10-CM | POA: Diagnosis not present

## 2022-06-03 DIAGNOSIS — C3491 Malignant neoplasm of unspecified part of right bronchus or lung: Secondary | ICD-10-CM | POA: Diagnosis present

## 2022-06-03 DIAGNOSIS — E875 Hyperkalemia: Secondary | ICD-10-CM | POA: Diagnosis present

## 2022-06-03 DIAGNOSIS — Y848 Other medical procedures as the cause of abnormal reaction of the patient, or of later complication, without mention of misadventure at the time of the procedure: Secondary | ICD-10-CM | POA: Diagnosis present

## 2022-06-03 DIAGNOSIS — B182 Chronic viral hepatitis C: Secondary | ICD-10-CM | POA: Diagnosis present

## 2022-06-03 DIAGNOSIS — C771 Secondary and unspecified malignant neoplasm of intrathoracic lymph nodes: Secondary | ICD-10-CM | POA: Diagnosis present

## 2022-06-03 DIAGNOSIS — C220 Liver cell carcinoma: Secondary | ICD-10-CM | POA: Diagnosis present

## 2022-06-03 DIAGNOSIS — Z79899 Other long term (current) drug therapy: Secondary | ICD-10-CM | POA: Diagnosis not present

## 2022-06-03 DIAGNOSIS — Z8249 Family history of ischemic heart disease and other diseases of the circulatory system: Secondary | ICD-10-CM | POA: Diagnosis not present

## 2022-06-03 LAB — BASIC METABOLIC PANEL
Anion gap: 7 (ref 5–15)
BUN: 26 mg/dL — ABNORMAL HIGH (ref 8–23)
CO2: 22 mmol/L (ref 22–32)
Calcium: 9.3 mg/dL (ref 8.9–10.3)
Chloride: 100 mmol/L (ref 98–111)
Creatinine, Ser: 0.96 mg/dL (ref 0.61–1.24)
GFR, Estimated: >60 mL/min (ref >60)
Glucose, Bld: 315 mg/dL — ABNORMAL HIGH (ref 70–99)
Potassium: 4.3 mmol/L (ref 3.5–5.1)
Sodium: 129 mmol/L — ABNORMAL LOW (ref 135–145)

## 2022-06-03 LAB — CBG MONITORING, ED: Glucose-Capillary: 295 mg/dL — ABNORMAL HIGH (ref 70–99)

## 2022-06-03 LAB — CBC
HCT: 27.3 % — ABNORMAL LOW (ref 39.0–52.0)
Hemoglobin: 8.7 g/dL — ABNORMAL LOW (ref 13.0–17.0)
MCH: 25.1 pg — ABNORMAL LOW (ref 26.0–34.0)
MCHC: 31.9 g/dL (ref 30.0–36.0)
MCV: 78.7 fL — ABNORMAL LOW (ref 80.0–100.0)
Platelets: 333 10*3/uL (ref 150–400)
RBC: 3.47 MIL/uL — ABNORMAL LOW (ref 4.22–5.81)
RDW: 17.1 % — ABNORMAL HIGH (ref 11.5–15.5)
WBC: 8.5 10*3/uL (ref 4.0–10.5)
nRBC: 0 % (ref 0.0–0.2)

## 2022-06-03 LAB — GLUCOSE, CAPILLARY
Glucose-Capillary: 105 mg/dL — ABNORMAL HIGH (ref 70–99)
Glucose-Capillary: 123 mg/dL — ABNORMAL HIGH (ref 70–99)
Glucose-Capillary: 194 mg/dL — ABNORMAL HIGH (ref 70–99)
Glucose-Capillary: 197 mg/dL — ABNORMAL HIGH (ref 70–99)
Glucose-Capillary: 258 mg/dL — ABNORMAL HIGH (ref 70–99)
Glucose-Capillary: 280 mg/dL — ABNORMAL HIGH (ref 70–99)
Glucose-Capillary: 96 mg/dL (ref 70–99)

## 2022-06-03 LAB — MRSA NEXT GEN BY PCR, NASAL: MRSA by PCR Next Gen: NOT DETECTED

## 2022-06-03 LAB — MAGNESIUM: Magnesium: 1.8 mg/dL (ref 1.7–2.4)

## 2022-06-03 LAB — HEMOGLOBIN A1C
Hgb A1c MFr Bld: 6.8 % — ABNORMAL HIGH (ref 4.8–5.6)
Mean Plasma Glucose: 148.46 mg/dL

## 2022-06-03 LAB — PHOSPHORUS: Phosphorus: 3.1 mg/dL (ref 2.5–4.6)

## 2022-06-03 MED ORDER — ENOXAPARIN SODIUM 40 MG/0.4ML IJ SOSY
40.0000 mg | PREFILLED_SYRINGE | Freq: Every day | INTRAMUSCULAR | Status: DC
  Administered 2022-06-03 – 2022-06-06 (×4): 40 mg via SUBCUTANEOUS
  Filled 2022-06-03 (×4): qty 0.4

## 2022-06-03 MED ORDER — DIPHENHYDRAMINE HCL 50 MG/ML IJ SOLN
25.0000 mg | Freq: Three times a day (TID) | INTRAMUSCULAR | Status: DC
  Administered 2022-06-03 – 2022-06-05 (×8): 25 mg via INTRAVENOUS
  Filled 2022-06-03 (×8): qty 1

## 2022-06-03 MED ORDER — ORAL CARE MOUTH RINSE: 15.0000 mL | OROMUCOSAL | Status: DC | PRN

## 2022-06-03 MED ORDER — METHYLPREDNISOLONE SODIUM SUCC 125 MG IJ SOLR: 60.0000 mg | Freq: Two times a day (BID) | INTRAMUSCULAR | Status: DC

## 2022-06-03 MED ORDER — HYDRALAZINE HCL 20 MG/ML IJ SOLN
10.0000 mg | Freq: Four times a day (QID) | INTRAMUSCULAR | Status: DC | PRN
  Administered 2022-06-04: 10 mg via INTRAVENOUS
  Filled 2022-06-03: qty 1

## 2022-06-03 MED ORDER — INSULIN ASPART 100 UNIT/ML IJ SOLN
0.0000 [IU] | INTRAMUSCULAR | Status: DC
  Administered 2022-06-03: 5 [IU] via SUBCUTANEOUS
  Administered 2022-06-03: 2 [IU] via SUBCUTANEOUS
  Administered 2022-06-03: 5 [IU] via SUBCUTANEOUS

## 2022-06-03 MED ORDER — FAMOTIDINE IN NACL 20-0.9 MG/50ML-% IV SOLN
20.0000 mg | Freq: Two times a day (BID) | INTRAVENOUS | Status: DC
  Administered 2022-06-03 – 2022-06-05 (×5): 20 mg via INTRAVENOUS
  Filled 2022-06-03 (×7): qty 50

## 2022-06-03 MED ORDER — SODIUM CHLORIDE 0.9 % IV SOLN
3.0000 g | Freq: Four times a day (QID) | INTRAVENOUS | Status: DC
  Administered 2022-06-03 – 2022-06-04 (×4): 3 g via INTRAVENOUS
  Filled 2022-06-03 (×4): qty 8

## 2022-06-03 MED ORDER — METHYLPREDNISOLONE SODIUM SUCC 40 MG IJ SOLR
40.0000 mg | INTRAMUSCULAR | Status: AC
  Administered 2022-06-03 – 2022-06-05 (×3): 40 mg via INTRAVENOUS
  Filled 2022-06-03 (×3): qty 1

## 2022-06-03 MED ORDER — CHLORHEXIDINE GLUCONATE CLOTH 2 % EX PADS
6.0000 | MEDICATED_PAD | Freq: Every day | CUTANEOUS | Status: DC
  Administered 2022-06-03: 6 via TOPICAL

## 2022-06-03 MED ORDER — DOCUSATE SODIUM 100 MG PO CAPS: 100.0000 mg | ORAL_CAPSULE | Freq: Two times a day (BID) | ORAL | Status: DC | PRN

## 2022-06-03 MED ORDER — INSULIN ASPART 100 UNIT/ML IJ SOLN
0.0000 [IU] | INTRAMUSCULAR | Status: DC
  Administered 2022-06-03: 3 [IU] via SUBCUTANEOUS
  Administered 2022-06-03: 2 [IU] via SUBCUTANEOUS
  Administered 2022-06-04: 3 [IU] via SUBCUTANEOUS

## 2022-06-03 MED ORDER — LOSARTAN POTASSIUM 50 MG PO TABS
50.0000 mg | ORAL_TABLET | Freq: Every day | ORAL | Status: DC
  Administered 2022-06-03 – 2022-06-06 (×4): 50 mg via ORAL
  Filled 2022-06-03 (×4): qty 1

## 2022-06-03 MED ORDER — POLYETHYLENE GLYCOL 3350 17 G PO PACK: 17.0000 g | PACK | Freq: Every day | ORAL | Status: DC | PRN

## 2022-06-03 MED ORDER — LACTATED RINGERS IV SOLN: INTRAVENOUS | Status: AC

## 2022-06-03 NOTE — Progress Notes (Signed)
Pharmacy Antibiotic Note  [name] is a 82 y.o. male admitted on 06/02/2022 with  aspiration pneumonia .  Pharmacy has been consulted for Unasyn dosing.  Patient underwent a bronch procedure on 1/25 afternoon and was subsequently prescribed azithromycin outpatient. He had an episode of angioedema after one dose of azithromycin--could be due to azithromycin and/or lisinopril. WBC 8.5, afebrile. Scr 1.25>>0.96. Will transition from azithromycin to Unasyn and treat for 5 days, per Dr. Tacy Learn.   Plan: -Start Unasyn IV 3g q6h  -Transition to Augmentin at discharge to complete a total of 5 days of antibiotic therapy. -Watch renal function, clinical picture    Height: 5\' 11"  (180.3 cm) Weight: 58.3 kg (128 lb 8.5 oz) IBW/kg (Calculated) : 75.3  Temp (24hrs), Avg:98.2 F (36.8 C), Min:97.7 F (36.5 C), Max:99 F (37.2 C)  Recent Labs  Lab 06/02/22 2235 06/03/22 0243  WBC 8.8 8.5  CREATININE 1.25* 0.96    Estimated Creatinine Clearance: 51.5 mL/min (by C-G formula based on SCr of 0.96 mg/dL).    Allergies  Allergen Reactions   Azithromycin Swelling   Ezetimibe-Simvastatin     Leg pains Restlessness at night     Metoprolol     bradycardia   Rosuvastatin     Discomfort, RESTLESS, CAN'T SLEEP      Microbiology results: 1/26 MRSA PCR: (-)   Thank you for allowing pharmacy to be a part of this patient's care.  Billey Gosling, PharmD PGY1 Pharmacy Resident 1/26/202412:21 PM

## 2022-06-03 NOTE — Progress Notes (Signed)
[facility] Progress Note Patient Name: [name] DOB: Jan 04, 1941 MRN: [id number]   Date of Service  06/03/2022  HPI/Events of Note  Patient on Lisinopril who developed angioedema after taking a dose of Zithromax , admitted to the ICU for close monitoring of his airway.  eICU Interventions  New Patient Evaluation.        Kerry Kass Deliyah Muckle 06/03/2022, 3:24 AM

## 2022-06-03 NOTE — Progress Notes (Signed)
82 year old male with aortic stenosis s/p AVR, hypertension, diabetes and chronic hepatitis C with hepatocellular carcinoma who is being evaluated for right apical lung mass, was given antibiotics postprocedure, patient developed tongue swelling came to the emergency department Was admitted with angioedema Of note patient does take lisinopril chronically  Denies any complaint at this time, stated tongue swelling is slightly better  Physical exam: General: Elderly male, lying on the bed HEENT: [place]/AT, eyes anicteric.  moist mucus membranes.  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 Hypertension Hepatitis C with hepatocellular carcinoma  Continue to hold ACE inhibitor and azithromycin I do not think patient today will be on ACE inhibitor again Continue Benadryl, famotidine and Solu-Medrol Epinephrine infusion Started on losartan Azithromycin was stopped Started on Unasyn, patient will go on Augmentin at home to complete total of 5 days therapy Serum creatinine continue to improve Serum potassium has improved as well Continue insulin with CBG goal 140-180 Hemoglobin A1c 6.8 Outpatient follow-up with pulmonary Monitor electrolytes    Jacky Kindle, MD [facility] See Amion for pager If no response to pager, please call [phone] until 7pm After 7pm, Please call E-link [phone]

## 2022-06-03 NOTE — H&P (Signed)
NAME:  [name], MRN:  [id number], DOB:  1940-10-17, LOS: 0 ADMISSION DATE:  06/02/2022, CONSULTATION DATE:  06/03/22 REFERRING MD:  EDP, CHIEF COMPLAINT:  angioedema   History of Present Illness:  [name] is a 82 y.o. M with PMH significant for aortic stenosis s/p AVR, HTN, DM2, HL, chronic hepatitis C, hepatocellular carcinoma, DVT, prior tobacco use and recent diagnosis of R apical lung mass and underwent EBUS on 1/25.  He follows with Dr. Loanne Hoffman at [facility].  After EBUS he was prescribed Azithromycin which he has not taken before.  He went to bed around 9pm and shortly after he had significant tongue swelling and presented to the ED.   He is also on Lisinopril chronically.  He did not have hives and blood pressure was stable.  He was given steroids, benadryl, epinephrine, TXA and C1 esterase inhibitor.  He is able to swallow his secretions.  ENT was consulted and felt that the posterior pharynx did not have significant swelling and that patient did not require intubation. PCCM consulted for admission  Pertinent  Medical History   has a past medical history of Acute epididymo-orchitis, Aortic stenosis, Arthritis, Diabetes mellitus without complication ([place]), ED (erectile dysfunction), Essential hypertension, benign, Heart murmur, Hep C w/o coma, chronic (HCC), History of blood transfusion, History of DVT of lower extremity, echocardiogram, Hypercholesterolemia, and PONV (postoperative nausea and vomiting).   Significant Hospital Events: Including procedures, antibiotic start and stop dates in addition to other pertinent events   1/25 bronch and later angioedema  Interim History / Subjective:  Pt is resting comfortably, feels slightly improved  Objective   Blood pressure (!) 153/81, pulse 78, temperature 98.4 F (36.9 C), temperature source Oral, resp. rate 15, SpO2 98 %.       No intake or output data in the 24 hours ending 06/03/22 0154 There were no vitals filed for  this visit.  General:  thin M, resting comfortably in no distress  HEENT: MM pink/moist, significant tongue edema, no lip or facial swelling, swallowing secretions, speech muffled Neuro: awake and oriented  CV: s1s2 rrr, no m/r/g PULM:  clear bilaterally on room air  GI: soft, non-distended  Extremities: warm/dry, no edema  Skin: no rashes or lesions   Resolved Hospital Problem list     Assessment & Plan:    Acute Angioedema Secondary to allergic reaction to Azithromycin or Ace-Inhibitor Induced Received steroids, benadryl, epinephrine, TXA and C1 esterase inhibitor Evaluated by ENT without significant posterior oropharynx edema -admit to ICU for close monitoring -continue epinephrine gtt overnight -solumedrol 60mg  bid -at risk for intubation   AKI Hyperkalemia K 5.9, creatinine 1.25 Suspect secondary to volume depletion -1 L LR, repeat potassium level -follow BMP, UOP and avoid nephrotoxins  Type 2 DM with Hyperglycemia -SSI, accuchecks  R apical lung mass -EBUS 1/25, follows with Dr. Loanne Hoffman  HTN HL -hold home medications, Norvasc and lisinopril-hctz -prn hyrdralazine    Best Practice (right click and "Reselect all SmartList Selections" daily)   Diet/type: NPO DVT prophylaxis: LMWH GI prophylaxis: N/A Lines: N/A Foley:  N/A Code Status:  full code Last date of multidisciplinary goals of care discussion [pending]  Labs   CBC: Recent Labs  Lab 06/02/22 2235  WBC 8.8  HGB 9.9*  HCT 30.9*  MCV 78.4*  PLT 235    Basic Metabolic Panel: Recent Labs  Lab 06/02/22 2235  NA 132*  K 5.9*  CL 99  CO2 23  GLUCOSE 324*  BUN 27*  CREATININE 1.25*  CALCIUM 9.8   GFR: Estimated Creatinine Clearance: 40.6 mL/min (A) (by C-G formula based on SCr of 1.25 mg/dL (H)). Recent Labs  Lab 06/02/22 2235  WBC 8.8    Liver Function Tests: No results for input(s): "AST", "ALT", "ALKPHOS", "BILITOT", "PROT", "ALBUMIN" in the last 168 hours. No results  for input(s): "LIPASE", "AMYLASE" in the last 168 hours. No results for input(s): "AMMONIA" in the last 168 hours.  ABG    Component Value Date/Time   PHART 7.347 (L) 11/20/2013 0045   PCO2ART 43.4 11/20/2013 0045   PO2ART 100.0 11/20/2013 0045   HCO3 23.7 11/20/2013 0045   TCO2 23 11/20/2013 1808   ACIDBASEDEF 2.0 11/20/2013 0045   O2SAT 97.0 11/20/2013 0045     Coagulation Profile: No results for input(s): "INR", "PROTIME" in the last 168 hours.  Cardiac Enzymes: No results for input(s): "CKTOTAL", "CKMB", "CKMBINDEX", "TROPONINI" in the last 168 hours.  HbA1C: Hgb A1c MFr Bld  Date/Time Value Ref Range Status  11/14/2013 01:05 PM 5.7 (H) <5.7 % Final    Comment:    (NOTE)                                                                       According to the ADA Clinical Practice Recommendations for 2011, when HbA1c is used as a screening test:  >=6.5%   Diagnostic of Diabetes Mellitus           (if abnormal result is confirmed) 5.7-6.4%   Increased risk of developing Diabetes Mellitus References:Diagnosis and Classification of Diabetes Mellitus,Diabetes OHYW,7371,06(YIRSW 1):S62-S69 and Standards of Medical Care in         Diabetes - 2011,Diabetes NIOE,7035,00 (Suppl 1):S11-S61.    CBG: Recent Labs  Lab 05/27/22 0722 06/02/22 1056 06/02/22 1520  GLUCAP 119* 96 109*    Review of Systems:   Please see the history of present illness. All other systems reviewed and are negative    Past Medical History:  He,  has a past medical history of Acute epididymo-orchitis, Aortic stenosis, Arthritis, Diabetes mellitus without complication ([place]), ED (erectile dysfunction), Essential hypertension, benign, Heart murmur, Hep C w/o coma, chronic (HCC), History of blood transfusion, History of DVT of lower extremity, echocardiogram, Hypercholesterolemia, and PONV (postoperative nausea and vomiting).   Surgical History:   Past Surgical History:  Procedure Laterality Date    AORTIC VALVE REPLACEMENT N/A 11/19/2013   Procedure: AORTIC VALVE REPLACEMENT (AVR);  Surgeon: Gaye Pollack, MD;  Location: [place];  Service: Open Heart Surgery;  Laterality: N/A;   BUNIONECTOMY Bilateral 05/09/2006   CARDIAC CATHETERIZATION  10/01/2013   COLONOSCOPY  01/07/2010   internal hemorrhoids.  Dr Deatra Ina   ESOPHAGOGASTRODUODENOSCOPY N/A 04/24/2013   Procedure: ESOPHAGOGASTRODUODENOSCOPY (EGD);  Surgeon: Jerene Bears, MD;  Location: [facility] ENDOSCOPY;  Service: Endoscopy;  Laterality: N/A;   INGUINAL HERNIA REPAIR Right    INTRAOPERATIVE TRANSESOPHAGEAL ECHOCARDIOGRAM N/A 11/19/2013   Procedure: INTRAOPERATIVE TRANSESOPHAGEAL ECHOCARDIOGRAM;  Surgeon: Gaye Pollack, MD;  Location: [place] OR;  Service: Open Heart Surgery;  Laterality: N/A;   KNEE ARTHROSCOPY Left 05/09/2009   Dr Collier Salina   LAMINECTOMY  06/09/2005   Dr Joya Salm. multiple lumbar level laminectomies.    LEFT AND RIGHT HEART CATHETERIZATION WITH CORONARY  ANGIOGRAM N/A 10/01/2013   Procedure: LEFT AND RIGHT HEART CATHETERIZATION WITH CORONARY ANGIOGRAM;  Surgeon: Candee Furbish, MD;  Location: [facility] CATH LAB;  Service: Cardiovascular;  Laterality: N/A;   SPINAL FUSION     lumbar   TEE WITHOUT CARDIOVERSION  11/07/2003   Aoritic valve sclerosis.    TONSILLECTOMY       Social History:   reports that he quit smoking about 31 years ago. His smoking use included cigarettes. He has a 46.50 pack-year smoking history. He has never used smokeless tobacco. He reports that he does not drink alcohol and does not use drugs.   Family History:  His family history includes Diabetes in his father and mother; Hypertension in his unknown relative; Stroke in his father and mother. There is no history of Heart attack.   Allergies Allergies  Allergen Reactions   Ezetimibe-Simvastatin     Leg pains Restlessness at night     Metoprolol     bradycardia   Rosuvastatin     Discomfort, RESTLESS, CAN'T SLEEP      Home Medications  Prior to  Admission medications   Medication Sig Start Date End Date Taking? Authorizing Provider  acetaminophen (TYLENOL) 500 MG tablet Take 500 mg by mouth every 6 (six) hours as needed for mild pain or moderate pain.    [provider]  amLODipine (NORVASC) 5 MG tablet Take 5 mg by mouth daily. 04/13/22   [provider]  aspirin EC 81 MG EC tablet Take 1 tablet (81 mg total) by mouth daily. 11/24/13   Gold, Wayne E, PA-C  atorvastatin (LIPITOR) 20 MG tablet Take 20 mg by mouth daily.    [provider]  azithromycin (ZITHROMAX Z-PAK) 250 MG tablet Take two tablets on day 1, then one tablet daily on day 2-5. 06/02/22   Margaretha Seeds, MD  dextromethorphan-guaiFENesin [facility] DM) 30-600 MG 12hr tablet Take 1 tablet by mouth 2 (two) times daily as needed (congestion).    [provider]  glucose blood test strip  01/15/14   [provider]  glucose blood test strip as directed DX 250 01/15/14   [provider]  lisinopril-hydrochlorothiazide (PRINZIDE,ZESTORETIC) 20-25 MG per tablet Take 1 tablet by mouth daily.    [provider]  metFORMIN (GLUCOPHAGE) 1000 MG tablet Take 1,000 mg by mouth 2 (two) times daily with a meal.    [provider]  Multiple Vitamin (MULTIVITAMIN WITH MINERALS) TABS tablet Take 1 tablet by mouth daily.    [provider]  SF 5000 PLUS 1.1 % CREA dental cream Place 1 Application onto teeth daily. 02/20/18   [provider]  tamsulosin (FLOMAX) 0.4 MG CAPS capsule Take 0.4 mg by mouth daily. 12/24/19   [provider]     Critical care time:  35 minutes    CRITICAL CARE Performed by: Otilio Carpen Giavanna Kang   Total critical care time: 35 minutes  Critical care time was exclusive of separately billable procedures and treating other patients.  Critical care was necessary to treat or prevent imminent or life-threatening deterioration.  Critical care was time spent personally by me on the  following activities: development of treatment plan with patient and/or surrogate as well as nursing, discussions with consultants, evaluation of patient's response to treatment, examination of patient, obtaining history from patient or surrogate, ordering and performing treatments and interventions, ordering and review of laboratory studies, ordering and review of radiographic studies, pulse oximetry and re-evaluation of patient's condition.   Mickel Baas  R Laiana Fratus, PA-C [facility] See Amion for pager If no response to pager , please call [phone] until 7pm After 7:00 pm call Elink  [phone]

## 2022-06-03 NOTE — Consult Note (Signed)
Reason for Consult:angioedema Referring Physician: Dr Victory Dakin is an 82 y.o. male.  HPI: hx of bronchoscopy today and went to bed and woke up about 9 with swelling of the tongue. He has had some progression of the swelling since he arrived in the emergency room but there has not been any further progression in the last 45 minutes to an hour.  He does not have any stridor.  No breathing difficulties.  He has not had this problem previously.  He has had steroids, epinephrine, and H2 blocker.  He is about to receive C1 esterase inhibitor.  Past Medical History:  Diagnosis Date   Acute epididymo-orchitis    Aortic stenosis    s/p bioprosthetic AVR (Dr. Cyndia Bent) 11/2013   Arthritis    Diabetes mellitus without complication [facility])    ED (erectile dysfunction)    Essential hypertension, benign    Heart murmur    Hep C w/o coma, chronic ([place])    History of blood transfusion    History of DVT of lower extremity    Hx of echocardiogram    Echo (8/15):  Severe LVH, EF 60-65%, no RWMA, Gr 3 DD, AVR ok (mean 21 mmHg), MAC, mild LAE, mild to mod RAE   Hypercholesterolemia    PONV (postoperative nausea and vomiting)     Past Surgical History:  Procedure Laterality Date   AORTIC VALVE REPLACEMENT N/A 11/19/2013   Procedure: AORTIC VALVE REPLACEMENT (AVR);  Surgeon: Gaye Pollack, MD;  Location: [place];  Service: Open Heart Surgery;  Laterality: N/A;   BUNIONECTOMY Bilateral 05/09/2006   CARDIAC CATHETERIZATION  10/01/2013   COLONOSCOPY  01/07/2010   internal hemorrhoids.  Dr Deatra Ina   ESOPHAGOGASTRODUODENOSCOPY N/A 04/24/2013   Procedure: ESOPHAGOGASTRODUODENOSCOPY (EGD);  Surgeon: Jerene Bears, MD;  Location: [facility] ENDOSCOPY;  Service: Endoscopy;  Laterality: N/A;   INGUINAL HERNIA REPAIR Right    INTRAOPERATIVE TRANSESOPHAGEAL ECHOCARDIOGRAM N/A 11/19/2013   Procedure: INTRAOPERATIVE TRANSESOPHAGEAL ECHOCARDIOGRAM;  Surgeon: Gaye Pollack, MD;  Location: [place] OR;  Service: Open Heart  Surgery;  Laterality: N/A;   KNEE ARTHROSCOPY Left 05/09/2009   Dr Collier Salina   LAMINECTOMY  06/09/2005   Dr Joya Salm. multiple lumbar level laminectomies.    LEFT AND RIGHT HEART CATHETERIZATION WITH CORONARY ANGIOGRAM N/A 10/01/2013   Procedure: LEFT AND RIGHT HEART CATHETERIZATION WITH CORONARY ANGIOGRAM;  Surgeon: Candee Furbish, MD;  Location[facility] CATH LAB;  Service: Cardiovascular;  Laterality: N/A;   SPINAL FUSION     lumbar   TEE WITHOUT CARDIOVERSION  11/07/2003   Aoritic valve sclerosis.    TONSILLECTOMY      Family History  Problem Relation Age of Onset   Diabetes Father    Stroke Father    Diabetes Mother    Stroke Mother    Hypertension Unknown        family history   Heart attack Neg Hx     Social History:  reports that he quit smoking about 31 years ago. His smoking use included cigarettes. He has a 46.50 pack-year smoking history. He has never used smokeless tobacco. He reports that he does not drink alcohol and does not use drugs.  Allergies:  Allergies  Allergen Reactions   Ezetimibe-Simvastatin     Leg pains Restlessness at night     Metoprolol     bradycardia   Rosuvastatin     Discomfort, RESTLESS, CAN'T SLEEP     Medications: I have reviewed the patient's current medications.  Results for orders  placed or performed during the hospital encounter of 06/02/22 (from the past 48 hour(s))  CBC     Status: Abnormal   Collection Time: 06/02/22 10:35 PM  Result Value Ref Range   WBC 8.8 4.0 - 10.5 K/uL   RBC 3.94 (L) 4.22 - 5.81 MIL/uL   Hemoglobin 9.9 (L) 13.0 - 17.0 g/dL   HCT 30.9 (L) 39.0 - 52.0 %   MCV 78.4 (L) 80.0 - 100.0 fL   MCH 25.1 (L) 26.0 - 34.0 pg   MCHC 32.0 30.0 - 36.0 g/dL   RDW 17.1 (H) 11.5 - 15.5 %   Platelets 378 150 - 400 K/uL   nRBC 0.0 0.0 - 0.2 %    Comment: Performed at [facility], [address] [place], [place] [address]  Basic metabolic panel     Status: Abnormal   Collection Time: 06/02/22 10:35 PM  Result Value  Ref Range   Sodium 132 (L) 135 - 145 mmol/L   Potassium 5.9 (H) 3.5 - 5.1 mmol/L   Chloride 99 98 - 111 mmol/L   CO2 23 22 - 32 mmol/L   Glucose, Bld 324 (H) 70 - 99 mg/dL    Comment: Glucose reference range applies only to samples taken after fasting for at least 8 hours.   BUN 27 (H) 8 - 23 mg/dL   Creatinine, Ser 1.25 (H) 0.61 - 1.24 mg/dL   Calcium 9.8 8.9 - 10.3 mg/dL   GFR, Estimated 59 (L) >60 mL/min    Comment: (NOTE) Calculated using the CKD-EPI Creatinine Equation (2021)    Anion gap 10 5 - 15    Comment: Performed at [facility] [address] [place], [place] [address]    No results found.  ROS Blood pressure (!) 144/67, pulse 86, temperature 98.4 F (36.9 C), temperature source Oral, resp. rate 20, SpO2 98 %. Physical Exam HENT:     Nose: Nose normal.     Mouth/Throat:     Comments: He is laying comfortably with no distress and no stridor.  He has a slight hot potato sounding voice but strong.  The tongue is right at the edge of his teeth on the right side with a lot of swelling in the floor of mouth toward the right side and the anterior right tongue.  The left side of his tongue has no significant swelling and the posterior pharyngeal wall and palate are normal.  He can move his neck well. Eyes:     Pupils: Pupils are equal, round, and reactive to light.  Musculoskeletal:     Cervical back: Normal range of motion.  Neurological:     Mental Status: He is alert.       Assessment/Plan: Angioedema-right now he seems to be stable and comfortable.  There is no airway problem at this time.  He does have a fairly significant amount of swelling in the right anterior tongue but the posterior pharynx and left side of his tongue are relatively normal.  I would not recommend a intubation at this time.  He is going to receive the C1 esterase inhibitor medication.  He should be watched carefully in the ICU by critical care.  Call if there is any further need for  airway management. Melissa Montane 06/03/2022, 12:01 AM

## 2022-06-04 ENCOUNTER — Encounter (HOSPITAL_COMMUNITY): Payer: Self-pay | Admitting: Critical Care Medicine

## 2022-06-04 DIAGNOSIS — T783XXD Angioneurotic edema, subsequent encounter: Secondary | ICD-10-CM

## 2022-06-04 LAB — GLUCOSE, CAPILLARY
Glucose-Capillary: 109 mg/dL — ABNORMAL HIGH (ref 70–99)
Glucose-Capillary: 89 mg/dL (ref 70–99)
Glucose-Capillary: 162 mg/dL — ABNORMAL HIGH (ref 70–99)
Glucose-Capillary: 169 mg/dL — ABNORMAL HIGH (ref 70–99)
Glucose-Capillary: 211 mg/dL — ABNORMAL HIGH (ref 70–99)

## 2022-06-04 LAB — ACID FAST SMEAR (AFB, MYCOBACTERIA): Acid Fast Smear: NEGATIVE

## 2022-06-04 MED ORDER — INSULIN ASPART 100 UNIT/ML IJ SOLN
0.0000 [IU] | Freq: Three times a day (TID) | INTRAMUSCULAR | Status: DC
  Administered 2022-06-04: 3 [IU] via SUBCUTANEOUS
  Administered 2022-06-05: 11 [IU] via SUBCUTANEOUS

## 2022-06-04 MED ORDER — INSULIN ASPART 100 UNIT/ML IJ SOLN
0.0000 [IU] | Freq: Every day | INTRAMUSCULAR | Status: DC
  Administered 2022-06-04: 2 [IU] via SUBCUTANEOUS
  Administered 2022-06-05: 3 [IU] via SUBCUTANEOUS

## 2022-06-04 MED ORDER — AMLODIPINE BESYLATE 5 MG PO TABS
5.0000 mg | ORAL_TABLET | Freq: Every day | ORAL | Status: DC
  Administered 2022-06-04 – 2022-06-06 (×3): 5 mg via ORAL
  Filled 2022-06-04 (×3): qty 1

## 2022-06-04 MED ORDER — DOXYCYCLINE HYCLATE 100 MG PO TABS
100.0000 mg | ORAL_TABLET | Freq: Two times a day (BID) | ORAL | Status: DC
  Administered 2022-06-04 – 2022-06-06 (×5): 100 mg via ORAL
  Filled 2022-06-04 (×5): qty 1

## 2022-06-04 NOTE — Plan of Care (Signed)

## 2022-06-04 NOTE — Progress Notes (Signed)
Report called to [facility]. Pt transferred to wheelchair with standby assist and transported off telemetry to non-tele med-surg room assignment by Zoe NT

## 2022-06-04 NOTE — Progress Notes (Signed)
NAME:  [name], MRN:  [id number], DOB:  02-16-1941, LOS: 1 ADMISSION DATE:  06/02/2022, CONSULTATION DATE:  06/04/22 REFERRING MD:  EDP, CHIEF COMPLAINT:  angioedema   History of Present Illness:  [name] is a 82 y.o. M with PMH significant for aortic stenosis s/p AVR, HTN, DM2, HL, chronic hepatitis C, hepatocellular carcinoma, DVT, prior tobacco use and recent diagnosis of R apical lung mass and underwent EBUS on 1/25.  He follows with Dr. Loanne Drilling at [facility].  After EBUS he was prescribed Azithromycin which he has not taken before.  He went to bed around 9pm and shortly after he had significant tongue swelling and presented to the ED.   He is also on Lisinopril chronically.  He did not have hives and blood pressure was stable.  He was given steroids, benadryl, epinephrine, TXA and C1 esterase inhibitor.  He is able to swallow his secretions.  ENT was consulted and felt that the posterior pharynx did not have significant swelling and that patient did not require intubation. PCCM consulted for admission  Pertinent  Medical History   has a past medical history of Acute epididymo-orchitis, Aortic stenosis, Arthritis, Diabetes mellitus without complication ([place]), ED (erectile dysfunction), Essential hypertension, benign, Heart murmur, Hep C w/o coma, chronic (HCC), History of blood transfusion, History of DVT of lower extremity, echocardiogram, Hypercholesterolemia, and PONV (postoperative nausea and vomiting).   Significant Hospital Events: Including procedures, antibiotic start and stop dates in addition to other pertinent events   1/25 bronch and later angioedema  Interim History / Subjective:   No bronchoscopy cytology results are available yet from 1/25 Epinephrine drip off, planned 3-day course corticosteroids.  Still receiving IV Benadryl Unasyn He states that his tongue is significantly decreased in size.  He is able to swallow clears and pills.  Managing his  secretions.  Does not feel any shortness of breath   Objective   Blood pressure (!) 148/72, pulse (!) 59, temperature 98 F (36.7 C), temperature source Oral, resp. rate 17, height 5\' 11"  (1.803 m), weight 58.4 kg, SpO2 100 %.        Intake/Output Summary (Last 24 hours) at 06/04/2022 1100 Last data filed at 06/04/2022 1006 Gross per 24 hour  Intake 376.11 ml  Output 1450 ml  Net -1073.89 ml   Filed Weights   06/03/22 0306 06/04/22 0500  Weight: 58.3 kg 58.4 kg    General: Thin man in bed in chair position in no distress HEENT: Tongue still looks slightly enlarged (smaller per his report), very mild dysarthria.  No stridor.  No secretions noted Neuro: Awake, alert, nonfocal CV: Regular, borderline bradycardic, no murmur PULM: Clear bilaterally GI: Soft, nondistended, positive bowel sounds Extremities: No edema Skin: No rash   Resolved Hospital Problem list     Assessment & Plan:   Acute Angioedema Secondary to allergic reaction to Azithromycin or Ace-Inhibitor Induced Received steroids, benadryl, epinephrine, TXA and C1 esterase inhibitor Evaluated by ENT without significant posterior oropharynx edema -Improved significantly per his report and per exam notes, still with some slight tongue enlargement. -Continue 3 days Solu-Medrol -Benadryl for 1 more day, then transition off 1/28 -Complete 3 days Pepcid twice daily -He will not be able to restart ACE inhibitor, does seem to be tolerating losartan -Azithromycin added to his allergy list as well as this was a new medication prior to this event -Okay to start a soft diet, has been tolerating clears  Bronchitis/pneumonia -Transition Unasyn to doxycycline p.o. and complete  5 more days  AKI resolved Hyperkalemia, resolved -Follow intermittent BMP, follow urine output  Type 2 DM with Hyperglycemia -Sliding-scale insulin if needed, particularly on steroids  R apical lung mass -Endobronchial ultrasound cytology is  still pending from 1/25 -Plan follow-up with Dr. Loanne Drilling  HTN HL -Started on losartan 1/26 and appears to be tolerating -Restart amlodipine -Hydralazine if needed  Dispo -should be able to move out of ICU 1/27  Best Practice (right click and "Reselect all SmartList Selections" daily)   Diet/type: dysphagia diet (see orders) DVT prophylaxis: LMWH GI prophylaxis: N/A Lines: N/A Foley:  N/A Code Status:  full code Last date of multidisciplinary goals of care discussion [pending]  Labs   CBC: Recent Labs  Lab 06/02/22 2235 06/03/22 0243  WBC 8.8 8.5  HGB 9.9* 8.7*  HCT 30.9* 27.3*  MCV 78.4* 78.7*  PLT 378 784    Basic Metabolic Panel: Recent Labs  Lab 06/02/22 2235 06/03/22 0243  NA 132* 129*  K 5.9* 4.3  CL 99 100  CO2 23 22  GLUCOSE 324* 315*  BUN 27* 26*  CREATININE 1.25* 0.96  CALCIUM 9.8 9.3  MG  --  1.8  PHOS  --  3.1   GFR: Estimated Creatinine Clearance: 51.5 mL/min (by C-G formula based on SCr of 0.96 mg/dL). Recent Labs  Lab 06/02/22 2235 06/03/22 0243  WBC 8.8 8.5    Liver Function Tests: No results for input(s): "AST", "ALT", "ALKPHOS", "BILITOT", "PROT", "ALBUMIN" in the last 168 hours. No results for input(s): "LIPASE", "AMYLASE" in the last 168 hours. No results for input(s): "AMMONIA" in the last 168 hours.  ABG    Component Value Date/Time   PHART 7.347 (L) 11/20/2013 0045   PCO2ART 43.4 11/20/2013 0045   PO2ART 100.0 11/20/2013 0045   HCO3 23.7 11/20/2013 0045   TCO2 23 11/20/2013 1808   ACIDBASEDEF 2.0 11/20/2013 0045   O2SAT 97.0 11/20/2013 0045     Coagulation Profile: No results for input(s): "INR", "PROTIME" in the last 168 hours.  Cardiac Enzymes: No results for input(s): "CKTOTAL", "CKMB", "CKMBINDEX", "TROPONINI" in the last 168 hours.  HbA1C: Hgb A1c MFr Bld  Date/Time Value Ref Range Status  06/03/2022 02:43 AM 6.8 (H) 4.8 - 5.6 % Final    Comment:    (NOTE) Pre diabetes:          5.7%-6.4%  Diabetes:               >6.4%  Glycemic control for   <7.0% adults with diabetes   11/14/2013 01:05 PM 5.7 (H) <5.7 % Final    Comment:    (NOTE)                                                                       According to the ADA Clinical Practice Recommendations for 2011, when HbA1c is used as a screening test:  >=6.5%   Diagnostic of Diabetes Mellitus           (if abnormal result is confirmed) 5.7-6.4%   Increased risk of developing Diabetes Mellitus References:Diagnosis and Classification of Diabetes Mellitus,Diabetes ONGE,9528,41(LKGMW 1):S62-S69 and Standards of Medical Care in         Diabetes - 2011,Diabetes NUUV,2536,64 (Suppl 1):S11-S61.  CBG: Recent Labs  Lab 06/03/22 1541 06/03/22 1948 06/03/22 2332 06/04/22 0330 06/04/22 0754  GLUCAP 105* 197* 96 109* 89     Critical care time: NA     Baltazar Apo, MD, PhD 06/04/2022, 11:01 AM [facility] [phone] or if no answer before 7:00PM call [phone] For any issues after 7:00PM please call eLink [phone]

## 2022-06-05 DIAGNOSIS — T783XXA Angioneurotic edema, initial encounter: Secondary | ICD-10-CM | POA: Diagnosis not present

## 2022-06-05 LAB — GLUCOSE, CAPILLARY
Glucose-Capillary: 88 mg/dL (ref 70–99)
Glucose-Capillary: 138 mg/dL — ABNORMAL HIGH (ref 70–99)
Glucose-Capillary: 344 mg/dL — ABNORMAL HIGH (ref 70–99)
Glucose-Capillary: 358 mg/dL — ABNORMAL HIGH (ref 70–99)
Glucose-Capillary: 199 mg/dL — ABNORMAL HIGH (ref 70–99)

## 2022-06-05 LAB — TSH: TSH: 0.283 u[IU]/mL — ABNORMAL LOW (ref 0.350–4.500)

## 2022-06-05 LAB — CORTISOL: Cortisol, Plasma: 3.5 ug/dL

## 2022-06-05 MED ORDER — DIPHENHYDRAMINE HCL 25 MG PO CAPS
25.0000 mg | ORAL_CAPSULE | Freq: Three times a day (TID) | ORAL | Status: DC
  Administered 2022-06-05 – 2022-06-06 (×2): 25 mg via ORAL
  Filled 2022-06-05 (×2): qty 1

## 2022-06-05 MED ORDER — FAMOTIDINE 20 MG PO TABS
20.0000 mg | ORAL_TABLET | Freq: Two times a day (BID) | ORAL | Status: DC
  Administered 2022-06-05 – 2022-06-06 (×2): 20 mg via ORAL
  Filled 2022-06-05 (×2): qty 1

## 2022-06-05 NOTE — Plan of Care (Signed)

## 2022-06-05 NOTE — Progress Notes (Signed)
PROGRESS NOTE    [name]  IWL:[id number] DOB: [birth year] DOA: 06/02/2022 PCP: Lujean Amel, MD  Outpatient Specialists:     Brief Narrative:  Patient is a 82 year old male with history of aortic stenosis s/p TAVR, hypertension, diabetes mellitus type 2, hyperlipidemia, chronic hep C, hepatocellular carcinoma, DVT, prior tobacco use and recent diagnosis of right apical lung mass.  Patient underwent EBUS 06/02/2022.  Post EBUS, patient was given azithromycin.  Patient has been on lisinopril.  Patient was admitted to ICU team with swelling of the tongue.  Patient was managed with steroids, Benadryl, epinephrine, tranexamic acid and C1 esterase inhibitor.  Swelling of the tongue is improving.  Patient has been transferred to hospitalist service for further management.  06/05/2022: Patient seen alongside patient's wife.  Swelling of the tongue is improving.  Hopefully, patient be discharged in the next 24 hours.   Assessment & Plan:   Principal Problem:   Angioedema   Acute Angioedema Secondary to allergic reaction to Azithromycin or Ace-Inhibitor Induced -Received steroids, benadryl, epinephrine, TXA and C1 esterase inhibitor -Evaluated by ENT without significant posterior oropharynx edema -Initially admitted to ICU. -Swelling of the tongue has improved significantly.   --Monitor the patient however now.  Likely discharge home tomorrow if patient continues to improve.     AKI: -AKI has resolved.  Hyperkalemia -Resolved. -Potassium is down to 4.3.    Hyponatremia: -Rule out SIADH.  There may be element of prerenal status. -Check urine sodium, urine osmolality and serum osmolality. -Check TSH and cortisol level. -Sodium is 129 today.   Type 2 DM with Hyperglycemia -SSI, accuchecks   R apical lung mass -EBUS 1/25, follows with Dr. Loanne Drilling   HTN: -Goal blood pressure should be less than 130/80 mmHg. -Patient is not on losartan. -Avoid ACE inhibitors.  HL   DVT  prophylaxis: Subcutaneous Lovenox. Code Status: Full code. Family Communication: Wife. Disposition Plan: Likely home in the next 24 hours.   Consultants:  Was initially admitted to ICU team.  Procedures:  None.  Antimicrobials:  None.   Subjective: Tongue swelling is improving.  Objective: Vitals:   06/04/22 2310 06/04/22 2349 06/05/22 0500 06/05/22 0611  BP: (!) 206/99 (!) 149/81  (!) 146/80  Pulse: 88 60  69  Resp:    18  Temp: 97.8 F (36.6 C)   98.4 F (36.9 C)  TempSrc: Oral   Oral  SpO2: 100%   100%  Weight:   58.5 kg   Height:        Intake/Output Summary (Last 24 hours) at 06/05/2022 1103 Last data filed at 06/05/2022 0302 Gross per 24 hour  Intake 680.03 ml  Output 1100 ml  Net -419.97 ml   Filed Weights   06/03/22 0306 06/04/22 0500 06/05/22 0500  Weight: 58.3 kg 58.4 kg 58.5 kg    Examination:  General exam: Appears calm and comfortable.  Mild enlargement of the tongue. Respiratory system: Clear to auscultation.  Cardiovascular system: S1 & S2 heard Gastrointestinal system: Abdomen is soft and nontender.  Central nervous system: Alert and oriented.    Data Reviewed: I have personally reviewed following labs and imaging studies  CBC: Recent Labs  Lab 06/02/22 2235 06/03/22 0243  WBC 8.8 8.5  HGB 9.9* 8.7*  HCT 30.9* 27.3*  MCV 78.4* 78.7*  PLT 378 174   Basic Metabolic Panel: Recent Labs  Lab 06/02/22 2235 06/03/22 0243  NA 132* 129*  K 5.9* 4.3  CL 99 100  CO2 23 22  GLUCOSE  324* 315*  BUN 27* 26*  CREATININE 1.25* 0.96  CALCIUM 9.8 9.3  MG  --  1.8  PHOS  --  3.1   GFR: Estimated Creatinine Clearance: 51.6 mL/min (by C-G formula based on SCr of 0.96 mg/dL). Liver Function Tests: No results for input(s): "AST", "ALT", "ALKPHOS", "BILITOT", "PROT", "ALBUMIN" in the last 168 hours. No results for input(s): "LIPASE", "AMYLASE" in the last 168 hours. No results for input(s): "AMMONIA" in the last 168 hours. Coagulation  Profile: No results for input(s): "INR", "PROTIME" in the last 168 hours. Cardiac Enzymes: No results for input(s): "CKTOTAL", "CKMB", "CKMBINDEX", "TROPONINI" in the last 168 hours. BNP (last 3 results) No results for input(s): "PROBNP" in the last 8760 hours. HbA1C: Recent Labs    06/03/22 0243  HGBA1C 6.8*   CBG: Recent Labs  Lab 06/04/22 0754 06/04/22 1131 06/04/22 1730 06/04/22 2123 06/05/22 0633  GLUCAP 89 162* 169* 211* 88   Lipid Profile: No results for input(s): "CHOL", "HDL", "LDLCALC", "TRIG", "CHOLHDL", "LDLDIRECT" in the last 72 hours. Thyroid Function Tests: No results for input(s): "TSH", "T4TOTAL", "FREET4", "T3FREE", "THYROIDAB" in the last 72 hours. Anemia Panel: No results for input(s): "VITAMINB12", "FOLATE", "FERRITIN", "TIBC", "IRON", "RETICCTPCT" in the last 72 hours. Urine analysis:    Component Value Date/Time   COLORURINE YELLOW 11/14/2013 [place] 11/14/2013 1305   LABSPEC 1.019 11/14/2013 1305   PHURINE 6.0 11/14/2013 1305   GLUCOSEU NEGATIVE 11/14/2013 1305   HGBUR NEGATIVE 11/14/2013 1305   BILIRUBINUR NEGATIVE 11/14/2013 1305   KETONESUR NEGATIVE 11/14/2013 1305   PROTEINUR NEGATIVE 11/14/2013 1305   UROBILINOGEN 0.2 11/14/2013 1305   NITRITE NEGATIVE 11/14/2013 1305   LEUKOCYTESUR SMALL (A) 11/14/2013 1305   Sepsis Labs: @LABRCNTIP (procalcitonin:4,lacticidven:4)  ) Recent Results (from the past 240 hour(s))  Novel Coronavirus, NAA ([facility])     Status: None   Collection Time: 05/30/22  9:59 AM   Specimen: Nasopharyngeal(NP) swabs in vial transport medium   Nasopharynge  Resident  Result Value Ref Range Status   SARS-CoV-2, NAA Not Detected Not Detected Final    Comment: This nucleic acid amplification test was developed and its performance characteristics determined by [employer]. Nucleic acid amplification tests include RT-PCR and TMA. This test has not been FDA cleared or approved. This test has  been authorized by FDA under an Emergency Use Authorization (EUA). This test is only authorized for the duration of time the declaration that circumstances exist justifying the authorization of the emergency use of in vitro diagnostic tests for detection of SARS-CoV-2 virus and/or diagnosis of COVID-19 infection under section 564(b)(1) of the Act, 21 U.S.C. 149FWY-6(V) (1), unless the authorization is terminated or revoked sooner. When diagnostic testing is negative, the possibility of a false negative result should be considered in the context of a patient's recent exposures and the presence of clinical signs and symptoms consistent with COVID-19. An individual without symptoms of COVID-19 and who is not shedding SARS-CoV-2 virus wo uld expect to have a negative (not detected) result in this assay.   Acid Fast Smear (AFB)     Status: None   Collection Time: 06/02/22  1:47 PM   Specimen: PATH Cytology Bronchial lavage; Body Fluid  Result Value Ref Range Status   AFB Specimen Processing Concentration  Final   Acid Fast Smear Negative  Final    Comment: (NOTE) Performed At: [facility] [place], [place] [id number] Rush Farmer MD [id number]    Source (AFB) BRONCHIAL ALVEOLAR LAVAGE  Final    Comment: RUL Performed at [facility], [place] [address], [place], [place] [address]   Aerobic/Anaerobic Culture w Gram Stain (surgical/deep wound)     Status: None (Preliminary result)   Collection Time: 06/02/22  1:47 PM   Specimen: Bronchoalveolar Lavage  Result Value Ref Range Status   Specimen Description BRONCHIAL ALVEOLAR LAVAGE RIGHT UPPER LUNG  Final   Special Requests NONE  Final   Gram Stain NO WBC SEEN NO ORGANISMS SEEN   Final   Culture   Final    NO GROWTH 2 DAYS NO ANAEROBES ISOLATED; CULTURE IN PROGRESS FOR 5 DAYS Performed at [facility], [place] [address] [place], [place] [address]    Report Status PENDING  Incomplete  MRSA Next Gen  by PCR, Nasal     Status: None   Collection Time: 06/03/22  3:02 AM   Specimen: Nasal Mucosa; Nasal Swab  Result Value Ref Range Status   MRSA by PCR Next Gen NOT DETECTED NOT DETECTED Final    Comment: (NOTE) The GeneXpert MRSA Assay (FDA approved for NASAL specimens only), is one component of a comprehensive MRSA colonization surveillance program. It is not intended to diagnose MRSA infection nor to guide or monitor treatment for MRSA infections. Test performance is not FDA approved in patients less than 6 years old. Performed at [facility], [place] [address] [place], [place] [address]          Radiology Studies: No results found.      Scheduled Meds:  amLODipine  5 mg Oral Daily   diphenhydrAMINE  25 mg Intravenous Q8H   doxycycline  100 mg Oral Q12H   enoxaparin (LOVENOX) injection  40 mg Subcutaneous Daily   insulin aspart  0-15 Units Subcutaneous TID WC   insulin aspart  0-5 Units Subcutaneous QHS   losartan  50 mg Oral Daily   Continuous Infusions:  sodium chloride Stopped (06/03/22 0311)   famotidine (PEPCID) IV Stopped (06/04/22 2331)     LOS: 2 days    Time spent: 35 minutes.    Dana Allan, MD  [facility] Pager #: [phone] 7PM-7AM contact night coverage as above

## 2022-06-06 ENCOUNTER — Inpatient Hospital Stay: Payer: Medicare Other

## 2022-06-06 ENCOUNTER — Inpatient Hospital Stay: Payer: Medicare Other | Admitting: Physician Assistant

## 2022-06-06 DIAGNOSIS — T783XXA Angioneurotic edema, initial encounter: Secondary | ICD-10-CM | POA: Diagnosis not present

## 2022-06-06 LAB — RENAL FUNCTION PANEL
Albumin: 2.5 g/dL — ABNORMAL LOW (ref 3.5–5.0)
Anion gap: 10 (ref 5–15)
BUN: 5 mg/dL — ABNORMAL LOW (ref 8–23)
CO2: 30 mmol/L (ref 22–32)
Calcium: 8.7 mg/dL — ABNORMAL LOW (ref 8.9–10.3)
Chloride: 97 mmol/L — ABNORMAL LOW (ref 98–111)
Creatinine, Ser: 0.82 mg/dL (ref 0.61–1.24)
GFR, Estimated: >60 mL/min (ref >60)
Glucose, Bld: 109 mg/dL — ABNORMAL HIGH (ref 70–99)
Phosphorus: 3.5 mg/dL (ref 2.5–4.6)
Potassium: 3 mmol/L — ABNORMAL LOW (ref 3.5–5.1)
Sodium: 137 mmol/L (ref 135–145)

## 2022-06-06 LAB — GLUCOSE, CAPILLARY: Glucose-Capillary: 224 mg/dL — ABNORMAL HIGH (ref 70–99)

## 2022-06-06 LAB — T4, FREE: Free T4: 0.97 ng/dL (ref 0.61–1.12)

## 2022-06-06 LAB — CYTOLOGY - NON PAP

## 2022-06-06 LAB — OSMOLALITY: Osmolality: 300 mOsm/kg — ABNORMAL HIGH (ref 275–295)

## 2022-06-06 MED ORDER — PREDNISONE 10 MG PO TABS: 20.0000 mg | ORAL_TABLET | Freq: Every day | ORAL | 0 refills | Status: AC

## 2022-06-06 MED ORDER — ONDANSETRON HCL 4 MG/2ML IJ SOLN: 4.0000 mg | Freq: Four times a day (QID) | INTRAMUSCULAR | Status: DC | PRN

## 2022-06-06 MED ORDER — ACETAMINOPHEN 325 MG PO TABS: 650.0000 mg | ORAL_TABLET | Freq: Four times a day (QID) | ORAL | Status: DC | PRN

## 2022-06-06 MED ORDER — IPRATROPIUM-ALBUTEROL 0.5-2.5 (3) MG/3ML IN SOLN: 3.0000 mL | RESPIRATORY_TRACT | Status: DC | PRN

## 2022-06-06 MED ORDER — DIPHENHYDRAMINE HCL 25 MG PO CAPS: 25.0000 mg | ORAL_CAPSULE | Freq: Two times a day (BID) | ORAL | 0 refills | Status: DC

## 2022-06-06 MED ORDER — SENNOSIDES-DOCUSATE SODIUM 8.6-50 MG PO TABS: 1.0000 | ORAL_TABLET | Freq: Every evening | ORAL | Status: DC | PRN

## 2022-06-06 MED ORDER — HYDROCHLOROTHIAZIDE 12.5 MG PO TABS: 12.5000 mg | ORAL_TABLET | Freq: Every day | ORAL | 0 refills | Status: DC

## 2022-06-06 MED ORDER — FAMOTIDINE 20 MG PO TABS: 20.0000 mg | ORAL_TABLET | Freq: Two times a day (BID) | ORAL | 0 refills | Status: DC

## 2022-06-06 MED ORDER — GUAIFENESIN 100 MG/5ML PO LIQD: 5.0000 mL | ORAL | Status: DC | PRN

## 2022-06-06 MED ORDER — HYDRALAZINE HCL 20 MG/ML IJ SOLN: 10.0000 mg | INTRAMUSCULAR | Status: DC | PRN

## 2022-06-06 MED ORDER — DOXYCYCLINE HYCLATE 100 MG PO TABS: 100.0000 mg | ORAL_TABLET | Freq: Two times a day (BID) | ORAL | 0 refills | Status: AC

## 2022-06-06 MED ORDER — AMLODIPINE BESYLATE 10 MG PO TABS: 10.0000 mg | ORAL_TABLET | Freq: Every day | ORAL | 0 refills | Status: DC

## 2022-06-06 MED ORDER — POTASSIUM CHLORIDE CRYS ER 20 MEQ PO TBCR
40.0000 meq | EXTENDED_RELEASE_TABLET | Freq: Once | ORAL | Status: AC
  Administered 2022-06-06: 40 meq via ORAL
  Filled 2022-06-06: qty 2

## 2022-06-06 MED ORDER — POTASSIUM CHLORIDE 10 MEQ/100ML IV SOLN
10.0000 meq | INTRAVENOUS | Status: AC
  Administered 2022-06-06 (×2): 10 meq via INTRAVENOUS
  Filled 2022-06-06 (×2): qty 100

## 2022-06-06 MED ORDER — TRAZODONE HCL 50 MG PO TABS: 50.0000 mg | ORAL_TABLET | Freq: Every evening | ORAL | Status: DC | PRN

## 2022-06-06 NOTE — Discharge Summary (Signed)
Physician Discharge Summary  [name] YTK:[id number] DOB: 04-Sep-1940 DOA: 06/02/2022  PCP: Lujean Amel, MD  Admit date: 06/02/2022 Discharge date: 06/06/2022  Admitted From: Home Disposition:  HOme  Recommendations for Outpatient Follow-up:  Follow up with PCP in 1-2 weeks Please obtain BMP/CBC in one week your next doctors visit.  Lisinopril discontinued and added to allergy list.  Increase Norvasc to 10 mg.  Separate tablet for hydrochlorothiazide prescribed as patient used to take combination of lisinopril/HCTZ. Oral doxycycline, Benadryl, Pepcid and prednisone prescribed to complete the course. Follow-up outpatient pulmonary regarding lung nodule.   Discharge Condition: Stable CODE STATUS: Full code Diet recommendation: Low-salt  Brief/Interim Summary: 82 year old male with history of aortic stenosis s/p TAVR, hypertension, diabetes mellitus type 2, hyperlipidemia, chronic hep C, hepatocellular carcinoma, DVT, prior tobacco use and recent diagnosis of right apical lung mass. Patient underwent EBUS 06/02/2022. Post EBUS, patient was given azithromycin. Patient has been on lisinopril. Patient was admitted to ICU team with swelling of the tongue. Patient was managed with steroids, Benadryl, epinephrine, tranexamic acid and C1 esterase inhibitor. Swelling of the tongue is improving. Patient has been transferred to hospitalist service for further management.    Acute Angioedema Secondary to allergic reaction to Azithromycin or Ace-Inhibitor Induced -Received steroids, benadryl, epinephrine, TXA and C1 esterase inhibitor while patient was in the ICU.  Patient was also seen by ENT and on their exam did not see any significant posterior oropharyngeal edema.  Patient was eventually transferred out to [place] floor where he was monitored.  His symptoms resolved.  Lisinopril was added to his allergy list.     AKI: -AKI has resolved.   Hyperkalemia Resolved   Hyponatremia: Resolved,  repeat blood work with PCP in next 1 week   Type 2 DM with Hyperglycemia Resume home meds   R apical lung mass -EBUS 1/25, follows with Dr. Loanne Drilling   HTN: Blood pressure medications changed or adjusted as mentioned above    Consultations: Critical care  Subjective: Feeling well, no complaints  Discharge Exam: Vitals:   06/06/22 0432 06/06/22 0741  BP: (!) 159/99 (!) 149/87  Pulse: 78 87  Resp:  17  Temp: 98.9 F (37.2 C) 99.6 F (37.6 C)  SpO2: 100% 100%   Vitals:   06/05/22 1602 06/05/22 2040 06/06/22 0432 06/06/22 0741  BP: (!) 151/96 (!) 153/87 (!) 159/99 (!) 149/87  Pulse: 73 65 78 87  Resp: 15 13  17   Temp: 98.7 F (37.1 C) 98.5 F (36.9 C) 98.9 F (37.2 C) 99.6 F (37.6 C)  TempSrc: Oral Oral Oral   SpO2: 100% 100% 100% 100%  Weight:      Height:        General: Pt is alert, awake, not in acute distress Cardiovascular: RRR, S1/S2 +, no rubs, no gallops Respiratory: CTA bilaterally, no wheezing, no rhonchi Abdominal: Soft, NT, ND, bowel sounds + Extremities: no edema, no cyanosis  Discharge Instructions   Allergies as of 06/06/2022       Reactions   Azithromycin Swelling   Angioedema 05/2022    Lisinopril    Angioedema 05/2022   Ezetimibe-simvastatin    Leg pains Restlessness at night    Metoprolol    bradycardia   Rosuvastatin    Discomfort, RESTLESS, CAN'T SLEEP        Medication List     STOP taking these medications    lisinopril-hydrochlorothiazide 20-25 MG tablet Commonly known as: ZESTORETIC       TAKE these medications  acetaminophen 500 MG tablet Commonly known as: TYLENOL Take 500 mg by mouth every 6 (six) hours as needed for mild pain or moderate pain.   amLODipine 10 MG tablet Commonly known as: NORVASC Take 1 tablet (10 mg total) by mouth daily. What changed:  medication strength how much to take   aspirin EC 81 MG tablet Take 1 tablet (81 mg total) by mouth daily.   atorvastatin 20 MG  tablet Commonly known as: LIPITOR Take 20 mg by mouth daily.   dextromethorphan-guaiFENesin 30-600 MG 12hr tablet Commonly known as: MUCINEX DM Take 1 tablet by mouth 2 (two) times daily as needed (congestion).   diphenhydrAMINE 25 mg capsule Commonly known as: Benadryl Allergy Take 1 capsule (25 mg total) by mouth 2 (two) times daily for 2 days.   doxycycline 100 MG tablet Commonly known as: VIBRA-TABS Take 1 tablet (100 mg total) by mouth every 12 (twelve) hours for 3 days.   famotidine 20 MG tablet Commonly known as: PEPCID Take 1 tablet (20 mg total) by mouth 2 (two) times daily for 3 days.   glucose blood test strip   glucose blood test strip as directed DX 250   hydrochlorothiazide 12.5 MG tablet Commonly known as: HYDRODIURIL Take 1 tablet (12.5 mg total) by mouth daily.   metFORMIN 1000 MG tablet Commonly known as: GLUCOPHAGE Take 1,000 mg by mouth 2 (two) times daily with a meal.   multivitamin with minerals Tabs tablet Take 1 tablet by mouth daily.   predniSONE 10 MG tablet Commonly known as: DELTASONE Take 2 tablets (20 mg total) by mouth daily with breakfast for 3 days.   SF 5000 Plus 1.1 % Crea dental cream Generic drug: sodium fluoride Place 1 Application onto teeth daily.   tamsulosin 0.4 MG Caps capsule Commonly known as: FLOMAX Take 0.4 mg by mouth daily.        Follow-up Information     [name], [name], MD Follow up in 1 week(s).   Specialty: Family Medicine Contact information: [address] [place] [address] [phone]                Allergies  Allergen Reactions   Azithromycin Swelling    Angioedema 05/2022    Lisinopril     Angioedema 05/2022   Ezetimibe-Simvastatin     Leg pains Restlessness at night     Metoprolol     bradycardia   Rosuvastatin     Discomfort, RESTLESS, CAN'T SLEEP     You were cared for by a hospitalist during your hospital stay. If you have any questions about your  discharge medications or the care you received while you were in the hospital after you are discharged, you can call the unit and asked to speak with the hospitalist on call if the hospitalist that took care of you is not available. Once you are discharged, your primary care physician will handle any further medical issues. Please note that no refills for any discharge medications will be authorized once you are discharged, as it is imperative that you return to your primary care physician (or establish a relationship with a primary care physician if you do not have one) for your aftercare needs so that they can reassess your need for medications and monitor your lab values.   Procedures/Studies: NM PET Image Initial (PI) Skull Base To Thigh  Result Date: 05/27/2022 CLINICAL DATA:  Initial treatment strategy for non-small-cell lung cancer. EXAM: NUCLEAR MEDICINE PET SKULL BASE TO THIGH  TECHNIQUE: 627 mCi F-18 FDG was injected intravenously. Full-ring PET imaging was performed from the skull base to thigh after the radiotracer. CT data was obtained and used for attenuation correction and anatomic localization. Fasting blood glucose: 119 mg/dl COMPARISON:  Chest CT 05/05/2022 FINDINGS: Mediastinal blood pool activity: SUV max 2.0 Liver activity: SUV max NA NECK: No hypermetabolic lymph nodes in the neck. Incidental CT findings: None. CHEST: Patient's known right apical mass is markedly hypermetabolic with SUV max = 98.3. 11 mm precarinal node visible on image 38/2 is hypermetabolic with SUV max = 4.6. Lesion extends into the upper right hilum. Incidental CT findings: Centrilobular and paraseptal emphysema evident. 4 mm anterior right upper lobe nodule on 67/4 is stable, below PET threshold for resolution. Coronary artery calcification is evident. Status post aortic valve replacement. Mitral annular calcification evident. Mild atherosclerotic calcification is noted in the wall of the thoracic aorta.  ABDOMEN/PELVIS: No abnormal hypermetabolic activity within the liver, pancreas, adrenal glands, or spleen. No hypermetabolic lymph nodes in the abdomen or pelvis. Physiologic uptake noted in small bowel loops of the pelvis Incidental CT findings: Bilateral renal cysts evident. There is moderate atherosclerotic calcification of the abdominal aorta without aneurysm. Moderate to large stool volume. Circumferential bladder wall thickening evident likely accentuated by underdistention. Prostate gland is enlarged. SKELETON: No focal hypermetabolic activity to suggest skeletal metastasis. Incidental CT findings: Degenerative changes noted in both shoulders. IMPRESSION: 1. Right apical mass is markedly hypermetabolic consistent with known primary bronchogenic neoplasm. 2. Hypermetabolic precarinal lymph node is compatible with metastatic disease. 3. No evidence for hypermetabolic metastatic disease in the neck, abdomen, or pelvis. 4. 4 mm anterior right upper lobe pulmonary nodule is stable, below PET threshold for resolution. Attention on follow-up recommended. 5. Prostatomegaly with circumferential bladder wall thickening likely accentuated by underdistention. 6. Aortic Atherosclerosis (ICD10-I70.0) and Emphysema (ICD10-J43.9). Electronically Signed   By: Misty Stanley M.D.   On: 05/27/2022 13:37   MR Brain W Wo Contrast  Result Date: 05/24/2022 CLINICAL DATA:  Lung carcinoma staging EXAM: MRI HEAD WITHOUT AND WITH CONTRAST TECHNIQUE: Multiplanar, multiecho pulse sequences of the brain and surrounding structures were obtained without and with intravenous contrast. CONTRAST:  40m GADAVIST GADOBUTROL 1 MMOL/ML IV SOLN COMPARISON:  None Available. FINDINGS: Brain: No acute infarction, hemorrhage, hydrocephalus, extra-axial collection or mass lesion. There is multifocal hyperintense T2-weighted signal within the periventricular and deep white matter. 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 No chronic  microhemorrhage. Midline structures are normal. Normal CSF spaces. No abnormal contrast enhancement. Vascular: Normal flow voids. Skull and upper cervical spine: Normal marrow signal. Sinuses/Orbits: Negative. Other: None. IMPRESSION: 1. No intracranial metastatic disease. 2. 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 Electronically Signed   By: KUlyses JarredM.D.   On: 05/24/2022 01:39     The results of significant diagnostics from this hospitalization (including imaging, microbiology, ancillary and laboratory) are listed below for reference.     Microbiology: Recent Results (from the past 240 hour(s))  Novel Coronavirus, NAA ([facility])     Status: None   Collection Time: 05/30/22  9:59 AM   Specimen: Nasopharyngeal(NP) swabs in vial transport medium   Nasopharynge  Resident  Result Value Ref Range Status   SARS-CoV-2, NAA Not Detected Not Detected Final    Comment: This nucleic acid amplification test was developed and its performance characteristics determined by [employer] Nucleic acid amplification tests include RT-PCR and TMA. This test  has not been FDA cleared or approved. This test has been authorized by FDA under an Emergency Use Authorization (EUA). This test is only authorized for the duration of time the declaration that circumstances exist justifying the authorization of the emergency use of in vitro diagnostic tests for detection of SARS-CoV-2 virus and/or diagnosis of COVID-19 infection under section 564(b)(1) of the Act, 21 U.S.C. 628ZMO-2(H) (1), unless the authorization is terminated or revoked sooner. When diagnostic testing is negative, the possibility of a false negative result should be considered in the context of a patient's recent exposures and the presence of clinical signs and symptoms consistent with COVID-19. An individual without symptoms of COVID-19 and who is not shedding SARS-CoV-2 virus  wo uld expect to have a negative (not detected) result in this assay.   Acid Fast Smear (AFB)     Status: None   Collection Time: 06/02/22  1:47 PM   Specimen: PATH Cytology Bronchial lavage; Body Fluid  Result Value Ref Range Status   AFB Specimen Processing Concentration  Final   Acid Fast Smear Negative  Final    Comment: (NOTE) Performed At: [facility] [place], [place] [id number] Rush Farmer MD [id number]    Source (AFB) BRONCHIAL ALVEOLAR LAVAGE  Final    Comment: RUL Performed at [facility], [place] [address], [place], [place] [address]   Aerobic/Anaerobic Culture w Gram Stain (surgical/deep wound)     Status: None (Preliminary result)   Collection Time: 06/02/22  1:47 PM   Specimen: Bronchoalveolar Lavage  Result Value Ref Range Status   Specimen Description BRONCHIAL ALVEOLAR LAVAGE RIGHT UPPER LUNG  Final   Special Requests NONE  Final   Gram Stain NO WBC SEEN NO ORGANISMS SEEN   Final   Culture   Final    NO GROWTH 4 DAYS NO ANAEROBES ISOLATED; CULTURE IN PROGRESS FOR 5 DAYS Performed at [facility], [place] [address], [place], [place] [address]    Report Status PENDING  Incomplete  MRSA Next Gen by PCR, Nasal     Status: None   Collection Time: 06/03/22  3:02 AM   Specimen: Nasal Mucosa; Nasal Swab  Result Value Ref Range Status   MRSA by PCR Next Gen NOT DETECTED NOT DETECTED Final    Comment: (NOTE) The GeneXpert MRSA Assay (FDA approved for NASAL specimens only), is one component of a comprehensive MRSA colonization surveillance program. It is not intended to diagnose MRSA infection nor to guide or monitor treatment for MRSA infections. Test performance is not FDA approved in patients less than 5 years old. Performed at [facility], [place] [address], [place],  [address]      Labs: BNP (last 3 results) No results for input(s): "BNP" in the last 8760 hours. Basic Metabolic Panel: Recent Labs  Lab  06/02/22 2235 06/03/22 0243 06/06/22 0504  NA 132* 129* 137  K 5.9* 4.3 3.0*  CL 99 100 97*  CO2 23 22 30   GLUCOSE 324* 315* 109*  BUN 27* 26* 5*  CREATININE 1.25* 0.96 0.82  CALCIUM 9.8 9.3 8.7*  MG  --  1.8  --   PHOS  --  3.1 3.5   Liver Function Tests: Recent Labs  Lab 06/06/22 0504  ALBUMIN 2.5*   No results for input(s): "LIPASE", "AMYLASE" in the last 168 hours. No results for input(s): "AMMONIA" in the last 168 hours. CBC: Recent Labs  Lab 06/02/22 2235 06/03/22 0243  WBC 8.8 8.5  HGB 9.9* 8.7*  HCT 30.9* 27.3*  MCV 78.4* 78.7*  PLT 378 333   Cardiac Enzymes: No results for input(s): "CKTOTAL", "CKMB", "CKMBINDEX", "TROPONINI" in the last 168 hours. BNP: Invalid input(s): "POCBNP" CBG: Recent Labs  Lab 06/05/22 1155 06/05/22 1600 06/05/22 1601 06/05/22 2014 06/06/22 0918  GLUCAP 138* 344* 358* 199* 224*   D-Dimer No results for input(s): "DDIMER" in the last 72 hours. Hgb A1c No results for input(s): "HGBA1C" in the last 72 hours. Lipid Profile No results for input(s): "CHOL", "HDL", "LDLCALC", "TRIG", "CHOLHDL", "LDLDIRECT" in the last 72 hours. Thyroid function studies Recent Labs    06/05/22 2049  TSH 0.283*   Anemia work up No results for input(s): "VITAMINB12", "FOLATE", "FERRITIN", "TIBC", "IRON", "RETICCTPCT" in the last 72 hours. Urinalysis    Component Value Date/Time   COLORURINE YELLOW 11/14/2013 [place] 11/14/2013 1305   LABSPEC 1.019 11/14/2013 1305   PHURINE 6.0 11/14/2013 1305   GLUCOSEU NEGATIVE 11/14/2013 1305   HGBUR NEGATIVE 11/14/2013 1305   BILIRUBINUR NEGATIVE 11/14/2013 1305   KETONESUR NEGATIVE 11/14/2013 1305   PROTEINUR NEGATIVE 11/14/2013 1305   UROBILINOGEN 0.2 11/14/2013 1305   NITRITE NEGATIVE 11/14/2013 1305   LEUKOCYTESUR SMALL (A) 11/14/2013 1305   Sepsis Labs Recent Labs  Lab 06/02/22 2235 06/03/22 0243  WBC 8.8 8.5   Microbiology Recent Results (from the past 240 hour(s))   Novel Coronavirus, NAA ([facility])     Status: None   Collection Time: 05/30/22  9:59 AM   Specimen: Nasopharyngeal(NP) swabs in vial transport medium   Nasopharynge  Resident  Result Value Ref Range Status   SARS-CoV-2, NAA Not Detected Not Detected Final    Comment: This nucleic acid amplification test was developed and its performance characteristics determined by [employer]. Nucleic acid amplification tests include RT-PCR and TMA. This test has not been FDA cleared or approved. This test has been authorized by FDA under an Emergency Use Authorization (EUA). This test is only authorized for the duration of time the declaration that circumstances exist justifying the authorization of the emergency use of in vitro diagnostic tests for detection of SARS-CoV-2 virus and/or diagnosis of COVID-19 infection under section 564(b)(1) of the Act, 21 U.S.C. 989QJJ-9(E) (1), unless the authorization is terminated or revoked sooner. When diagnostic testing is negative, the possibility of a false negative result should be considered in the context of a patient's recent exposures and the presence of clinical signs and symptoms consistent with COVID-19. An individual without symptoms of COVID-19 and who is not shedding SARS-CoV-2 virus wo uld expect to have a negative (not detected) result in this assay.   Acid Fast Smear (AFB)     Status: None   Collection Time: 06/02/22  1:47 PM   Specimen: PATH Cytology Bronchial lavage; Body Fluid  Result Value Ref Range Status   AFB Specimen Processing Concentration  Final   Acid Fast Smear Negative  Final    Comment: (NOTE) Performed At: [facility] [place], [place] [id number] Rush Farmer MD [id number]    Source (AFB) BRONCHIAL ALVEOLAR LAVAGE  Final    Comment: RUL Performed at [facility], [place] [address] [place], [place] [address]   Aerobic/Anaerobic Culture w Gram Stain (surgical/deep wound)      Status: None (Preliminary result)   Collection Time: 06/02/22  1:47 PM   Specimen: Bronchoalveolar Lavage  Result Value Ref Range Status   Specimen Description BRONCHIAL ALVEOLAR LAVAGE RIGHT UPPER LUNG  Final  Special Requests NONE  Final   Gram Stain NO WBC SEEN NO ORGANISMS SEEN   Final   Culture   Final    NO GROWTH 4 DAYS NO ANAEROBES ISOLATED; CULTURE IN PROGRESS FOR 5 DAYS Performed at [facility], [place] [address], [place], [place] [address]    Report Status PENDING  Incomplete  MRSA Next Gen by PCR, Nasal     Status: None   Collection Time: 06/03/22  3:02 AM   Specimen: Nasal Mucosa; Nasal Swab  Result Value Ref Range Status   MRSA by PCR Next Gen NOT DETECTED NOT DETECTED Final    Comment: (NOTE) The GeneXpert MRSA Assay (FDA approved for NASAL specimens only), is one component of a comprehensive MRSA colonization surveillance program. It is not intended to diagnose MRSA infection nor to guide or monitor treatment for MRSA infections. Test performance is not FDA approved in patients less than 50 years old. Performed at [facility], [place] [address], [place], [place] [address]      Time coordinating discharge:  I have spent 35 minutes face to face with the patient and on the ward discussing the patients care, assessment, plan and disposition with other care givers. >50% of the time was devoted counseling the patient about the risks and benefits of treatment/Discharge disposition and coordinating care.   SIGNED:   Damita Lack, MD  [facility] 06/06/2022, 12:23 PM   If 7PM-7AM, please contact night-coverage

## 2022-06-07 LAB — AEROBIC/ANAEROBIC CULTURE W GRAM STAIN (SURGICAL/DEEP WOUND)
Culture: NO GROWTH
Gram Stain: NONE SEEN

## 2022-06-08 ENCOUNTER — Telehealth: Payer: Self-pay | Admitting: Pulmonary Disease

## 2022-06-08 DIAGNOSIS — C3490 Malignant neoplasm of unspecified part of unspecified bronchus or lung: Secondary | ICD-10-CM | POA: Insufficient documentation

## 2022-06-08 NOTE — Progress Notes (Signed)
[name] OFFICE PROGRESS NOTE  [name], [name], MD [place] [address] [place] [address]  DIAGNOSIS:  Stage III (T3, N2, M0) non-small cell lung cancer, squamous cell carcinoma.  The patient presented with a large centrally necrotic right upper lobe lung mass abutting the mediastinum, T3 and T4 vertebral bodies and a hypermetabolic precarinal lymph node.   PD-L1 requested today  PRIOR THERAPY: None   CURRENT THERAPY: Concurrent chemoradiation with carboplatin for an AUC of 2 and paclitaxel 45 mg/m starting 06/20/2022.   INTERVAL HISTORY: [name] 82 y.o. male returns to the clinic today for a follow-up visit accompanied by his wife.  The patient was first seen in clinic on 05/12/22 after he was found to have apical right upper lobe lung mass. However, he did not have tissue biopsy.  Since last being seen he underwent biopsy which the final pathology showed squamous cell carcinoma.  A few days following his biopsy he was hospitalized for angioedema.  Since being discharged he is feeling better at this time.  He saw Dr. Loanne Drilling from pulmonology and she is arranging for referral to CT surgery and PFTs. He is scheduled to see Dr. Kipp Brood tomorrow.   He denies any changes in his health. He denies any fever, night sweats. He has cold intolerance. Of note, he takes an iron supplement daily. Despite this, he has microcytic anemia. Iron studies today show persistent iron deficiency.  He denies any history of stomach ulcers, abdominal pain, or abnormal bleeding or bruising.  He denies significant dyspnea on exertion but may experience it periodically and will rest and his symptoms improved. He reports his dyspnea is the same. He sometimes has a cough which reportedly improves with tylenol and benadryl. Denies any chest pain or hemoptysis.  Denies any nausea, vomiting, diarrhea, or constipation.  He denies recent headaches or vision changes. His MRI negative for  metastatic disease to the brain. He is here today to review his pathology and for a more detailed discussion about his current condition and recommended treatment options.   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   Arthritis    Diabetes mellitus without complication [facility])    ED (erectile dysfunction)    Essential hypertension, benign    Heart murmur    Hep C w/o coma, chronic ([place])    History of blood transfusion    History of DVT of lower extremity    Hx of echocardiogram    Echo (8/15):  Severe LVH, EF 60-65%, no RWMA, Gr 3 DD, AVR ok (mean 21 mmHg), MAC, mild LAE, mild to mod RAE   Hypercholesterolemia    PONV (postoperative nausea and vomiting)     ALLERGIES:  is allergic to azithromycin, lisinopril, ezetimibe-simvastatin, metoprolol, and rosuvastatin.  MEDICATIONS:  Current Outpatient Medications  Medication Sig Dispense Refill   acetaminophen (TYLENOL) 500 MG tablet Take 500 mg by mouth every 6 (six) hours as needed for mild pain or moderate pain.     amLODipine (NORVASC) 10 MG tablet Take 1 tablet (10 mg total) by mouth daily. 30 tablet 0   aspirin EC 81 MG EC tablet Take 1 tablet (81 mg total) by mouth daily.     atorvastatin (LIPITOR) 20 MG tablet Take 20 mg by mouth daily.     dextromethorphan-guaiFENesin (MUCINEX DM) 30-600 MG 12hr tablet Take 1 tablet by mouth 2 (  two) times daily as needed (congestion).     diphenhydrAMINE (BENADRYL ALLERGY) 25 mg capsule Take 1 capsule (25 mg total) by mouth 2 (two) times daily for 2 days. 4 capsule 0   doxycycline (VIBRA-TABS) 100 MG tablet Take 1 tablet (100 mg total) by mouth every 12 (twelve) hours for 3 days. 6 tablet 0   famotidine (PEPCID) 20 MG tablet Take 1 tablet (20 mg total) by mouth 2 (two) times daily for 3 days. 6 tablet 0   glucose blood test strip      glucose blood test strip as directed DX 250      hydrochlorothiazide (HYDRODIURIL) 12.5 MG tablet Take 1 tablet (12.5 mg total) by mouth daily. 30 tablet 0   metFORMIN (GLUCOPHAGE) 1000 MG tablet Take 1,000 mg by mouth 2 (two) times daily with a meal.     Multiple Vitamin (MULTIVITAMIN WITH MINERALS) TABS tablet Take 1 tablet by mouth daily.     predniSONE (DELTASONE) 10 MG tablet Take 2 tablets (20 mg total) by mouth daily with breakfast for 3 days. 6 tablet 0   SF 5000 PLUS 1.1 % CREA dental cream Place 1 Application onto teeth daily.  2   tamsulosin (FLOMAX) 0.4 MG CAPS capsule Take 0.4 mg by mouth daily.     No current facility-administered medications for this visit.    SURGICAL HISTORY:  Past Surgical History:  Procedure Laterality Date   AORTIC VALVE REPLACEMENT N/A 11/19/2013   Procedure: AORTIC VALVE REPLACEMENT (AVR);  Surgeon: Gaye Pollack, MD;  Location: [place];  Service: Open Heart Surgery;  Laterality: N/A;   BUNIONECTOMY Bilateral 05/09/2006   CARDIAC CATHETERIZATION  10/01/2013   COLONOSCOPY  01/07/2010   internal hemorrhoids.  Dr Deatra Ina   ESOPHAGOGASTRODUODENOSCOPY N/A 04/24/2013   Procedure: ESOPHAGOGASTRODUODENOSCOPY (EGD);  Surgeon: Jerene Bears, MD;  Location: [facility] ENDOSCOPY;  Service: Endoscopy;  Laterality: N/A;   INGUINAL HERNIA REPAIR Right    INTRAOPERATIVE TRANSESOPHAGEAL ECHOCARDIOGRAM N/A 11/19/2013   Procedure: INTRAOPERATIVE TRANSESOPHAGEAL ECHOCARDIOGRAM;  Surgeon: Gaye Pollack, MD;  Location: [place] [facility];  Service: Open Heart Surgery;  Laterality: N/A;   KNEE ARTHROSCOPY Left 05/09/2009   Dr Collier Salina   LAMINECTOMY  06/09/2005   Dr Joya Salm. multiple lumbar level laminectomies.    LEFT AND RIGHT HEART CATHETERIZATION WITH CORONARY ANGIOGRAM N/A 10/01/2013   Procedure: LEFT AND RIGHT HEART CATHETERIZATION WITH CORONARY ANGIOGRAM;  Surgeon: Candee Furbish, MD;  Location: [facility] CATH LAB;  Service: Cardiovascular;  Laterality: N/A;   SPINAL FUSION     lumbar   TEE WITHOUT CARDIOVERSION  11/07/2003   Aoritic valve  sclerosis.    TONSILLECTOMY     VIDEO BRONCHOSCOPY WITH ENDOBRONCHIAL ULTRASOUND N/A 06/02/2022   Procedure: VIDEO BRONCHOSCOPY WITH ENDOBRONCHIAL ULTRASOUND;  Surgeon: Margaretha Seeds, MD;  Location: [place];  Service: Thoracic;  Laterality: N/A;    REVIEW OF SYSTEMS:   Review of Systems  Constitutional: Negative for appetite change, chills, fatigue, fever and unexpected weight change.  HENT: Negative for mouth sores, nosebleeds, sore throat and trouble swallowing.   Eyes: Negative for eye problems and icterus.  Respiratory: Positive for cough and mild dyspnea on exertion with certain activities.  Negative for hemoptysis, and wheezing.   Cardiovascular: Negative for chest pain and leg swelling.  Gastrointestinal: Negative for abdominal pain, constipation, diarrhea, nausea and vomiting.  Genitourinary: Negative for bladder incontinence, difficulty urinating, dysuria, frequency and hematuria.   Musculoskeletal: Negative for back pain, gait problem, neck pain and neck stiffness.  Skin: Negative  for itching and rash.  Neurological: Positive for occasional headaches.  Negative for dizziness, extremity weakness, gait problem, light-headedness and seizures.  Hematological: Negative for adenopathy. Does not bruise/bleed easily.  Psychiatric/Behavioral: Negative for confusion, depression and sleep disturbance. The patient is not nervous/anxious. .     PHYSICAL EXAMINATION:  There were no vitals taken for this visit.  ECOG PERFORMANCE STATUS: 1  Physical Exam  Constitutional: Oriented to person, place, and time and thin appearing male and in no distress.   HENT:  Head: Normocephalic and atraumatic.  Mouth/Throat: Oropharynx is clear and moist. No oropharyngeal exudate.  Eyes: Conjunctivae are normal. Right eye exhibits no discharge. Left eye exhibits no discharge. No scleral icterus.  Neck: Normal range of motion. Neck supple.  Cardiovascular: Normal rate, regular rhythm, murmur noted and  intact distal pulses.   Pulmonary/Chest: Effort normal and breath sounds normal. No respiratory distress. No wheezes. No rales.  Abdominal: Soft. Bowel sounds are normal. Exhibits no distension and no mass. There is no tenderness.  Musculoskeletal: Normal range of motion. Exhibits no edema.  Lymphadenopathy:    No cervical adenopathy.  Neurological: Alert and oriented to person, place, and time. Exhibits muscle wasting.  Gait normal. Coordination normal.  Skin: Skin is warm and dry. No rash noted. Not diaphoretic. No erythema. No pallor.  Psychiatric: Mood, memory and judgment normal.  Vitals reviewed.  LABORATORY DATA: Lab Results  Component Value Date   WBC 8.5 06/03/2022   HGB 8.7 (L) 06/03/2022   HCT 27.3 (L) 06/03/2022   MCV 78.7 (L) 06/03/2022   PLT 333 06/03/2022      Chemistry      Component Value Date/Time   NA 137 06/06/2022 0504   K 3.0 (L) 06/06/2022 0504   CL 97 (L) 06/06/2022 0504   CO2 30 06/06/2022 0504   BUN 5 (L) 06/06/2022 0504   CREATININE 0.82 06/06/2022 0504   CREATININE 1.06 05/12/2022 1335      Component Value Date/Time   CALCIUM 8.7 (L) 06/06/2022 0504   ALKPHOS 52 05/12/2022 1335   AST 14 (L) 05/12/2022 1335   ALT 6 05/12/2022 1335   BILITOT 0.2 (L) 05/12/2022 1335       RADIOGRAPHIC STUDIES:  NM PET Image Initial (PI) Skull Base To Thigh  Result Date: 05/27/2022 CLINICAL DATA:  Initial treatment strategy for non-small-cell lung cancer. EXAM: NUCLEAR MEDICINE PET SKULL BASE TO THIGH TECHNIQUE: 627 mCi F-18 FDG was injected intravenously. Full-ring PET imaging was performed from the skull base to thigh after the radiotracer. CT data was obtained and used for attenuation correction and anatomic localization. Fasting blood glucose: 119 mg/dl COMPARISON:  Chest CT 05/05/2022 FINDINGS: Mediastinal blood pool activity: SUV max 2.0 Liver activity: SUV max NA NECK: No hypermetabolic lymph nodes in the neck. Incidental CT findings: None. CHEST: Patient's  known right apical mass is markedly hypermetabolic with SUV max = 56.4. 11 mm precarinal node visible on image 33/2 is hypermetabolic with SUV max = 4.6. Lesion extends into the upper right hilum. Incidental CT findings: Centrilobular and paraseptal emphysema evident. 4 mm anterior right upper lobe nodule on 67/4 is stable, below PET threshold for resolution. Coronary artery calcification is evident. Status post aortic valve replacement. Mitral annular calcification evident. Mild atherosclerotic calcification is noted in the wall of the thoracic aorta. ABDOMEN/PELVIS: No abnormal hypermetabolic activity within the liver, pancreas, adrenal glands, or spleen. No hypermetabolic lymph nodes in the abdomen or pelvis. Physiologic uptake noted in small bowel loops of the pelvis  Incidental CT findings: Bilateral renal cysts evident. There is moderate atherosclerotic calcification of the abdominal aorta without aneurysm. Moderate to large stool volume. Circumferential bladder wall thickening evident likely accentuated by underdistention. Prostate gland is enlarged. SKELETON: No focal hypermetabolic activity to suggest skeletal metastasis. Incidental CT findings: Degenerative changes noted in both shoulders. IMPRESSION: 1. Right apical mass is markedly hypermetabolic consistent with known primary bronchogenic neoplasm. 2. Hypermetabolic precarinal lymph node is compatible with metastatic disease. 3. No evidence for hypermetabolic metastatic disease in the neck, abdomen, or pelvis. 4. 4 mm anterior right upper lobe pulmonary nodule is stable, below PET threshold for resolution. Attention on follow-up recommended. 5. Prostatomegaly with circumferential bladder wall thickening likely accentuated by underdistention. 6. Aortic Atherosclerosis (ICD10-I70.0) and Emphysema (ICD10-J43.9). Electronically Signed   By: Misty Stanley M.D.   On: 05/27/2022 13:37   MR Brain W Wo Contrast  Result Date: 05/24/2022 CLINICAL DATA:  Lung  carcinoma staging EXAM: MRI HEAD WITHOUT AND WITH CONTRAST TECHNIQUE: Multiplanar, multiecho pulse sequences of the brain and surrounding structures were obtained without and with intravenous contrast. CONTRAST:  10m GADAVIST GADOBUTROL 1 MMOL/ML IV SOLN COMPARISON:  None Available. FINDINGS: Brain: No acute infarction, hemorrhage, hydrocephalus, extra-axial collection or mass lesion. There is multifocal hyperintense T2-weighted signal within the periventricular and deep white matter. 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 No chronic microhemorrhage. Midline structures are normal. Normal CSF spaces. No abnormal contrast enhancement. Vascular: Normal flow voids. Skull and upper cervical spine: Normal marrow signal. Sinuses/Orbits: Negative. Other: None. IMPRESSION: 1. No intracranial metastatic disease. 2. 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 Electronically Signed   By: KUlyses JarredM.D.   On: 05/24/2022 01:39     ASSESSMENT/PLAN:  This is a very pleasant 82year old African-American male diagnosed with stage III (T3, N2, M0) non-small cell lung cancer, squamous cell carcinoma.  He presented with a right apical lung mass and suspicious precarinal lymph node involvement.  PD-L1 was requested today. He was diagnosed in January 2024.  The patient had his staging PET scan and brain MRI.  The patient was seen with Dr. MJulien Nordmanntoday.  Dr. MJulien Nordmannhad a lengthy discussion with the patient today about his current condition and recommended treatment options.    Given the large and centrally located mass, it is unlikely that the patient will be a surgical candidate.  He is scheduled for formal evaluation with CT surgery tomorrow.    Dr. MJulien Nordmanndiscussed today that we recommend treatment with concurrent chemoradiation with carboplatin for an AUC of 2 and paclitaxel 45 mg/m.  The patient is interested in this and we will tentatively  arrange for his first cycle of treatment on 06/20/2022.  The adverse side effects of treatment were discussed including but not limited to fatigue, myelosuppression, peripheral neuropathy, alopecia, nausea, vomiting, diarrhea, or constipation.   We will arrange for chemo education class prior to receiving his first cycle of treatment.  I have placed a referral to radiation oncology.  I sent a prescription for Compazine 10 mg p.o. every 6 hours to the patient's pharmacy for nausea and vomiting.  I will arrange for iron infusions with Venofer 300 mg weekly x 3 starting next week for his persistent iron deficiency   Will see the patient back for follow-up visit with his second week of his treatment.  The patient was advised to call immediately if he has any concerning symptoms in the interval. The patient  voices understanding of current disease status and treatment options and is in agreement with the current care plan. All questions were answered. The patient knows to call the clinic with any problems, questions or concerns. We can certainly see the patient much sooner if necessary  No orders of the defined types were placed in this encounter.    Chistine Dematteo L Chadwick Reiswig, PA-C 06/08/22  ADDENDUM: Hematology/Oncology Attending: I had a face-to-face encounter with the patient today.  I reviewed his record, lab, scan and recommended his care plan.  This is a very pleasant 82 years old African-American male recently diagnosed with a stage IIIb (T3, N2, M0) non-small cell lung cancer presented with large and central necrotic right upper lobe lung mass abutting the mediastinum in addition to T3 and T4 vertebral body involvement and hypermetabolic precarinal lymph node. I had a lengthy discussion with the patient today about his current condition and treatment options. I doubt the patient will be a good surgical candidate for resection but he has an appointment with Dr. Kipp Brood for discussion of  this option. I recommended for the patient a course of concurrent chemoradiation with weekly carboplatin for AUC of 2 and paclitaxel 45 Mg/M2 for 6-7 weeks followed by consolidation treatment with immunotherapy if he has no evidence for disease progression. I discussed with the patient and his wife the adverse effect of this treatment including but not limited to alopecia, myelosuppression, nausea and vomiting, peripheral neuropathy, liver or renal dysfunction. I will see the patient back for follow-up visit in 2 weeks for evaluation and management of any adverse effect of his treatment. The patient was advised to call immediately if he has any other concerning symptoms in the interval. The total time spent in the appointment was 30 minutes. Disclaimer: This note was dictated with voice recognition software. Similar sounding words can inadvertently be transcribed and may be missed upon review. Eilleen Kempf, MD

## 2022-06-08 NOTE — Telephone Encounter (Signed)
[place] [facility] Telephone Encounter  I contacted patient regarding bronchoscopy results which did result  positive for squamous cell carcinoma at the right apical/paratracheal mass. Station 4R and 7 neg for malignancy.  He is scheduled to see Oncology on 06/09/21 to discuss plan further. Message sent to CTS to determine if consult still needed.  Patient will need Pulmonary follow-up with PFTs in the next month as well.  Patient expressed understanding and appreciated the phone call.

## 2022-06-09 ENCOUNTER — Telehealth: Payer: Self-pay

## 2022-06-09 ENCOUNTER — Inpatient Hospital Stay: Payer: Medicare Other

## 2022-06-09 ENCOUNTER — Other Ambulatory Visit: Payer: Self-pay

## 2022-06-09 ENCOUNTER — Inpatient Hospital Stay (HOSPITAL_BASED_OUTPATIENT_CLINIC_OR_DEPARTMENT_OTHER): Payer: Medicare Other | Admitting: Physician Assistant

## 2022-06-09 ENCOUNTER — Other Ambulatory Visit: Payer: Self-pay | Admitting: Internal Medicine

## 2022-06-09 VITALS — BP 149/60 | HR 61 | Temp 97.4°F | Resp 14 | Wt 134.1 lb

## 2022-06-09 DIAGNOSIS — D509 Iron deficiency anemia, unspecified: Secondary | ICD-10-CM | POA: Insufficient documentation

## 2022-06-09 DIAGNOSIS — C3411 Malignant neoplasm of upper lobe, right bronchus or lung: Secondary | ICD-10-CM | POA: Insufficient documentation

## 2022-06-09 DIAGNOSIS — Z5111 Encounter for antineoplastic chemotherapy: Secondary | ICD-10-CM | POA: Insufficient documentation

## 2022-06-09 DIAGNOSIS — Z510 Encounter for antineoplastic radiation therapy: Secondary | ICD-10-CM | POA: Diagnosis not present

## 2022-06-09 DIAGNOSIS — I10 Essential (primary) hypertension: Secondary | ICD-10-CM | POA: Insufficient documentation

## 2022-06-09 DIAGNOSIS — R918 Other nonspecific abnormal finding of lung field: Secondary | ICD-10-CM

## 2022-06-09 DIAGNOSIS — Z87891 Personal history of nicotine dependence: Secondary | ICD-10-CM | POA: Diagnosis not present

## 2022-06-09 DIAGNOSIS — C3491 Malignant neoplasm of unspecified part of right bronchus or lung: Secondary | ICD-10-CM | POA: Diagnosis not present

## 2022-06-09 DIAGNOSIS — K5900 Constipation, unspecified: Secondary | ICD-10-CM | POA: Diagnosis not present

## 2022-06-09 DIAGNOSIS — R112 Nausea with vomiting, unspecified: Secondary | ICD-10-CM | POA: Insufficient documentation

## 2022-06-09 DIAGNOSIS — E119 Type 2 diabetes mellitus without complications: Secondary | ICD-10-CM | POA: Diagnosis not present

## 2022-06-09 DIAGNOSIS — Z953 Presence of xenogenic heart valve: Secondary | ICD-10-CM | POA: Diagnosis not present

## 2022-06-09 DIAGNOSIS — Z7189 Other specified counseling: Secondary | ICD-10-CM

## 2022-06-09 DIAGNOSIS — C3490 Malignant neoplasm of unspecified part of unspecified bronchus or lung: Secondary | ICD-10-CM

## 2022-06-09 LAB — CBC WITH DIFFERENTIAL (CANCER CENTER ONLY)
Abs Immature Granulocytes: 0.07 10*3/uL (ref 0.00–0.07)
Basophils Absolute: 0 10*3/uL (ref 0.0–0.1)
Basophils Relative: 0 %
Eosinophils Absolute: 0.2 10*3/uL (ref 0.0–0.5)
Eosinophils Relative: 2 %
HCT: 30.4 % — ABNORMAL LOW (ref 39.0–52.0)
Hemoglobin: 9.8 g/dL — ABNORMAL LOW (ref 13.0–17.0)
Immature Granulocytes: 1 %
Lymphocytes Relative: 9 %
Lymphs Abs: 1.1 10*3/uL (ref 0.7–4.0)
MCH: 25.1 pg — ABNORMAL LOW (ref 26.0–34.0)
MCHC: 32.2 g/dL (ref 30.0–36.0)
MCV: 77.9 fL — ABNORMAL LOW (ref 80.0–100.0)
Monocytes Absolute: 1.2 10*3/uL — ABNORMAL HIGH (ref 0.1–1.0)
Monocytes Relative: 11 %
Neutro Abs: 9.2 10*3/uL — ABNORMAL HIGH (ref 1.7–7.7)
Neutrophils Relative %: 77 %
Platelet Count: 364 10*3/uL (ref 150–400)
RBC: 3.9 MIL/uL — ABNORMAL LOW (ref 4.22–5.81)
RDW: 17.6 % — ABNORMAL HIGH (ref 11.5–15.5)
WBC Count: 11.8 10*3/uL — ABNORMAL HIGH (ref 4.0–10.5)
nRBC: 0 % (ref 0.0–0.2)

## 2022-06-09 LAB — CMP (CANCER CENTER ONLY)
ALT: 8 U/L (ref 0–44)
AST: 12 U/L — ABNORMAL LOW (ref 15–41)
Albumin: 3.4 g/dL — ABNORMAL LOW (ref 3.5–5.0)
Alkaline Phosphatase: 57 U/L (ref 38–126)
Anion gap: 7 (ref 5–15)
BUN: 20 mg/dL (ref 8–23)
CO2: 27 mmol/L (ref 22–32)
Calcium: 10.8 mg/dL — ABNORMAL HIGH (ref 8.9–10.3)
Chloride: 102 mmol/L (ref 98–111)
Creatinine: 0.84 mg/dL (ref 0.61–1.24)
GFR, Estimated: >60 mL/min (ref >60)
Glucose, Bld: 158 mg/dL — ABNORMAL HIGH (ref 70–99)
Potassium: 4.8 mmol/L (ref 3.5–5.1)
Sodium: 136 mmol/L (ref 135–145)
Total Bilirubin: 0.4 mg/dL (ref 0.3–1.2)
Total Protein: 8.4 g/dL — ABNORMAL HIGH (ref 6.5–8.1)

## 2022-06-09 LAB — IRON AND IRON BINDING CAPACITY (CC-WL,HP ONLY)
Iron: 28 ug/dL — ABNORMAL LOW (ref 45–182)
Saturation Ratios: 10 % — ABNORMAL LOW (ref 17.9–39.5)
TIBC: 276 ug/dL (ref 250–450)
UIBC: 248 ug/dL (ref 117–376)

## 2022-06-09 LAB — FERRITIN: Ferritin: 102 ng/mL (ref 24–336)

## 2022-06-09 MED ORDER — PROCHLORPERAZINE MALEATE 10 MG PO TABS: 10.0000 mg | ORAL_TABLET | Freq: Four times a day (QID) | ORAL | 2 refills | Status: DC | PRN

## 2022-06-09 NOTE — Progress Notes (Signed)
Pt had a follow up with Cassie and Dr.Mohamed after his biopsy on 1/25. Pt is accompanied by his wife, Izora Gala.  Results of his biopsy show squamous cell carcinoma. Per Dr.Mohamed, the pt will be receiving concurrent chemotherapy and radiation for 6 weeks. Referral to Radiation Oncology was ordered and information about the chemo therapy drugs were provided by Cassie. I explained that someone from [facility] will be reaching out to them in the near future for evaluation and planning of his radiation treatments. Pt will likely have a chemotherapy education class next week and the plan is for him to start treatment on 2/12. I provided the pt and his wife with my contact information and encouraged them to call me if they have any questions or concerns.

## 2022-06-09 NOTE — Telephone Encounter (Signed)
This nurse reached out to [facility]ology per provider and requested PDL! Testing on accession # [id number].  Staging is 3 and Diagnosis code C34.92.  No further questions or concerns noted at this time.

## 2022-06-09 NOTE — Patient Instructions (Signed)
-  There are two main categories of lung cancer, they are named based on the size of the cancer cell. One is called Non-Small cell lung cancer. The other type is Small Cell Lung Cancer -The sample (biopsy) that they took of your tumor was consistent with a subtype of Non-small cell lung cancer called squamous cell carcinoma.  -We covered a lot of important information at your appointment today regarding what the treatment plan is moving forward. Here are the the main points that were discussed at your office visit with [place] today:  -The treatment that you will receive consists of two chemotherapy drugs called Carboplatin and Paclitaxel (also called Taxol) -We are planning on starting your treatment next week on 06/20/22 but before your start your treatment, I would like you to attend a Chemotherapy Education Class. This involves having you sit down with one of our nurse educators. She will discuss with you one-on-one more details about your treatment as well as general information about resources here at the [facility] treatment will be given every week for about 6 weeks or so (as long as you are also receiving radiation). We will check your labs (blood work) once a week . Dr. Julien Nordmann or I will see you every other treatment just to make sure that you are doing well and that everything is on track. -We will get a CT scan about 3 weeks after you complete your treatment  Medications:  -Compazine was sent to your pharmacy. This medication is for nausea. You may take this every 6 hours as needed if you feel nausous.   Referrals -I will place the referral to radiation oncology to meet with you to discuss starting radiation treatment. Please be on the lookout for a phone call from them to schedule a consultation.   Follow Up:  -We will see you back for a follow up visit in 2.5 weeks with the second week of treatment

## 2022-06-09 NOTE — Progress Notes (Signed)
START ON PATHWAY REGIMEN - Non-Small Cell Lung     A cycle is every 7 days, concurrent with RT:     Paclitaxel      Carboplatin   **Always confirm dose/schedule in your pharmacy ordering system**  Patient Characteristics: Preoperative or Nonsurgical Candidate (Clinical Staging), Stage III - Nonsurgical Candidate (Nonsquamous and Squamous), PS = 0, 1 Therapeutic Status: Preoperative or Nonsurgical Candidate (Clinical Staging) AJCC T Category: cT3 AJCC N Category: cN2 AJCC M Category: cM0 AJCC 8 Stage Grouping: IIIB ECOG Performance Status: 1 Intent of Therapy: Curative Intent, Discussed with Patient

## 2022-06-10 ENCOUNTER — Institutional Professional Consult (permissible substitution) (INDEPENDENT_AMBULATORY_CARE_PROVIDER_SITE_OTHER): Payer: Medicare Other | Admitting: Thoracic Surgery (Cardiothoracic Vascular Surgery)

## 2022-06-10 ENCOUNTER — Encounter: Payer: Self-pay | Admitting: Thoracic Surgery (Cardiothoracic Vascular Surgery)

## 2022-06-10 ENCOUNTER — Encounter: Payer: Self-pay | Admitting: Internal Medicine

## 2022-06-10 ENCOUNTER — Encounter: Payer: Self-pay | Admitting: Physician Assistant

## 2022-06-10 VITALS — BP 138/71 | HR 72 | Resp 20 | Ht 71.0 in | Wt 134.0 lb

## 2022-06-10 DIAGNOSIS — R918 Other nonspecific abnormal finding of lung field: Secondary | ICD-10-CM

## 2022-06-10 NOTE — Interval H&P Note (Signed)
History and Physical Interval Note:  This is back-dated for 06/02/22. Erroneously pulled RN note instead of consult note 05/26/22 for interval H&P for procedure. 1:27 PM  DIVIT STIPP  has presented today for surgery, with the diagnosis of MASS OF RIGHT UPPER LOBE.  The various methods of treatment have been discussed with the patient and family. After consideration of risks, benefits and other options for treatment, the patient has consented to  Procedure(s): [place] (N/A) as a surgical intervention.  The patient's history has been reviewed, patient examined, no change in status, stable for surgery.  I have reviewed the patient's chart and labs.  Questions were answered to the patient's satisfaction.     Darsha Zumstein Rodman Pickle, MD

## 2022-06-12 ENCOUNTER — Other Ambulatory Visit: Payer: Self-pay

## 2022-06-13 ENCOUNTER — Ambulatory Visit (INDEPENDENT_AMBULATORY_CARE_PROVIDER_SITE_OTHER): Payer: Medicare Other | Admitting: Podiatry

## 2022-06-13 ENCOUNTER — Encounter: Payer: Self-pay | Admitting: Podiatry

## 2022-06-13 VITALS — BP 142/78

## 2022-06-13 DIAGNOSIS — I10 Essential (primary) hypertension: Secondary | ICD-10-CM | POA: Diagnosis not present

## 2022-06-13 DIAGNOSIS — M79675 Pain in left toe(s): Secondary | ICD-10-CM | POA: Diagnosis not present

## 2022-06-13 DIAGNOSIS — E119 Type 2 diabetes mellitus without complications: Secondary | ICD-10-CM | POA: Diagnosis not present

## 2022-06-13 DIAGNOSIS — I700 Atherosclerosis of aorta: Secondary | ICD-10-CM | POA: Diagnosis not present

## 2022-06-13 DIAGNOSIS — E1142 Type 2 diabetes mellitus with diabetic polyneuropathy: Secondary | ICD-10-CM | POA: Diagnosis not present

## 2022-06-13 DIAGNOSIS — E1169 Type 2 diabetes mellitus with other specified complication: Secondary | ICD-10-CM | POA: Diagnosis not present

## 2022-06-13 DIAGNOSIS — M2140 Flat foot [pes planus] (acquired), unspecified foot: Secondary | ICD-10-CM

## 2022-06-13 DIAGNOSIS — M79674 Pain in right toe(s): Secondary | ICD-10-CM | POA: Diagnosis not present

## 2022-06-13 DIAGNOSIS — L84 Corns and callosities: Secondary | ICD-10-CM | POA: Diagnosis not present

## 2022-06-13 DIAGNOSIS — Z952 Presence of prosthetic heart valve: Secondary | ICD-10-CM | POA: Diagnosis not present

## 2022-06-13 DIAGNOSIS — B351 Tinea unguium: Secondary | ICD-10-CM

## 2022-06-13 DIAGNOSIS — Q828 Other specified congenital malformations of skin: Secondary | ICD-10-CM

## 2022-06-13 DIAGNOSIS — C3490 Malignant neoplasm of unspecified part of unspecified bronchus or lung: Secondary | ICD-10-CM | POA: Diagnosis not present

## 2022-06-13 DIAGNOSIS — J432 Centrilobular emphysema: Secondary | ICD-10-CM | POA: Diagnosis not present

## 2022-06-13 NOTE — Progress Notes (Signed)
ANNUAL DIABETIC FOOT EXAM  Subjective: [name] presents today for annual diabetic foot examination.  Chief Complaint  Patient presents with   Nail Problem    DFC BS-121 A1C-6.3 PCP-[name] PCP VST-2023   Patient confirms h/o diabetes.  Patient relates 10 year h/o diabetes.  Patient denies any h/o foot wounds.  Patient has h/o foot ulcer of L 2nd toe in 2020, which healed via help of local wound care.  Patient denies any numbness, tingling, burning, or pins/needle sensation in feet.  Risk factors: diabetes, foot deformity, h/o DVT, HTN, CAD, hyperlipidemia, hypercholesterolemia, h/o tobacco use in remission, recently diagnosed with cancer .  [name], [name], MD is patient's PCP.  Past Medical History:  Diagnosis Date   Acute epididymo-orchitis    Aortic stenosis    s/p bioprosthetic AVR (Dr. Cyndia Bent) 11/2013   Arthritis    Diabetes mellitus without complication [facility])    ED (erectile dysfunction)    Essential hypertension, benign    Heart murmur    Hep C w/o coma, chronic ([place])    History of blood transfusion    History of DVT of lower extremity    Hx of echocardiogram    Echo (8/15):  Severe LVH, EF 60-65%, no RWMA, Gr 3 DD, AVR ok (mean 21 mmHg), MAC, mild LAE, mild to mod RAE   Hypercholesterolemia    PONV (postoperative nausea and vomiting)    Patient Active Problem List   Diagnosis Date Noted   Iron deficiency anemia 06/09/2022   Goals of care, counseling/discussion 06/09/2022   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   Angioedema 06/03/2022   Lung mass 05/12/2022   Heart murmur 06/02/2021   Acquired plantar porokeratosis    Corns and callosities    Diabetic neuropathy (HCC)    Hammertoes of both feet    Hallux valgus, acquired, bilateral    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 ([place])    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   Benign prostatic hyperplasia with lower urinary tract symptoms 07/14/2020    Obstructive uropathy 07/14/2020   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   Pure hypercholesterolemia 07/14/2020   Hepatocellular carcinoma ([place]) 10/19/2017   Elevated LFTs 08/26/2014   S/P AVR (aortic valve replacement) 11/22/2013   Hyperlipidemia 09/03/2013   Weight loss 09/03/2013   Coronary artery disease 09/03/2013   Hepatitis C 05/29/2013   Unspecified gastritis and gastroduodenitis without mention of hemorrhage 04/24/2013   Iron deficiency anemia due to chronic blood loss 04/23/2013   Hypertension    Diabetes mellitus with coincident hypertension ([place])    Shortness of breath 04/22/2013   Past Surgical History:  Procedure Laterality Date   AORTIC VALVE REPLACEMENT N/A 11/19/2013   Procedure: AORTIC VALVE REPLACEMENT (AVR);  Surgeon: Gaye Pollack, MD;  Location: [place];  Service: Open Heart Surgery;  Laterality: N/A;   BUNIONECTOMY Bilateral 05/09/2006   CARDIAC CATHETERIZATION  10/01/2013   COLONOSCOPY  01/07/2010   internal hemorrhoids.  Dr Deatra Ina   ESOPHAGOGASTRODUODENOSCOPY N/A 04/24/2013   Procedure: ESOPHAGOGASTRODUODENOSCOPY (EGD);  Surgeon: Jerene Bears, MD;  [facility] ENDOSCOPY;  Service: Endoscopy;  Laterality: N/A;   INGUINAL HERNIA REPAIR Right    INTRAOPERATIVE TRANSESOPHAGEAL ECHOCARDIOGRAM N/A 11/19/2013   Procedure: INTRAOPERATIVE TRANSESOPHAGEAL ECHOCARDIOGRAM;  Surgeon: Gaye Pollack, MD;  Location: [place] OR;  Service: Open Heart Surgery;  Laterality: N/A;   KNEE ARTHROSCOPY Left 05/09/2009   Dr Collier Salina   LAMINECTOMY  06/09/2005   Dr Joya Salm. multiple lumbar level laminectomies.  LEFT AND RIGHT HEART CATHETERIZATION WITH CORONARY ANGIOGRAM N/A 10/01/2013   Procedure: LEFT AND RIGHT HEART CATHETERIZATION WITH CORONARY ANGIOGRAM;  Surgeon: Candee Furbish, MD;  Location: [facility] CATH LAB;  Service: Cardiovascular;  Laterality: N/A;   SPINAL FUSION     lumbar   TEE WITHOUT CARDIOVERSION  11/07/2003   Aoritic valve sclerosis.    TONSILLECTOMY     VIDEO  BRONCHOSCOPY WITH ENDOBRONCHIAL ULTRASOUND N/A 06/02/2022   Procedure: VIDEO BRONCHOSCOPY WITH ENDOBRONCHIAL ULTRASOUND;  Surgeon: Margaretha Seeds, MD;  Location: [place];  Service: Thoracic;  Laterality: N/A;   Current Outpatient Medications on File Prior to Visit  Medication Sig Dispense Refill   acetaminophen (TYLENOL) 500 MG tablet Take 500 mg by mouth every 6 (six) hours as needed for mild pain or moderate pain.     amLODipine (NORVASC) 10 MG tablet Take 1 tablet (10 mg total) by mouth daily. 30 tablet 0   aspirin EC 81 MG EC tablet Take 1 tablet (81 mg total) by mouth daily.     atorvastatin (LIPITOR) 20 MG tablet Take 20 mg by mouth daily.     dextromethorphan-guaiFENesin (MUCINEX DM) 30-600 MG 12hr tablet Take 1 tablet by mouth 2 (two) times daily as needed (congestion).     glucose blood test strip      glucose blood test strip as directed DX 250     hydrochlorothiazide (HYDRODIURIL) 12.5 MG tablet Take 1 tablet (12.5 mg total) by mouth daily. 30 tablet 0   metFORMIN (GLUCOPHAGE) 1000 MG tablet Take 1,000 mg by mouth 2 (two) times daily with a meal.     Multiple Vitamin (MULTIVITAMIN WITH MINERALS) TABS tablet Take 1 tablet by mouth daily.     prochlorperazine (COMPAZINE) 10 MG tablet Take 1 tablet (10 mg total) by mouth every 6 (six) hours as needed. 30 tablet 2   SF 5000 PLUS 1.1 % CREA dental cream Place 1 Application onto teeth daily.  2   tamsulosin (FLOMAX) 0.4 MG CAPS capsule Take 0.4 mg by mouth daily.     No current facility-administered medications on file prior to visit.    Allergies  Allergen Reactions   Azithromycin Swelling    Angioedema 05/2022    Lisinopril     Angioedema 05/2022   Ezetimibe-Simvastatin     Leg pains Restlessness at night     Metoprolol     bradycardia   Rosuvastatin     Discomfort, RESTLESS, CAN'T SLEEP    Social History   Occupational History   Not on file  Tobacco Use   Smoking status: Former    Packs/day: 1.50    Years: 31.00     Total pack years: 46.50    Types: Cigarettes    Quit date: 78    Years since quitting: 31.1   Smokeless tobacco: Never  Vaping Use   Vaping Use: Never used  Substance and Sexual Activity   Alcohol use: No   Drug use: No   Sexual activity: Not on file   Family History  Problem Relation Age of Onset   Diabetes Father    Stroke Father    Diabetes Mother    Stroke Mother    Hypertension Unknown        family history   Heart attack Neg Hx    Immunization History  Administered Date(s) Administered   Influenza, High Dose Seasonal PF 02/03/2017, 02/06/2018, 02/05/2019     Review of Systems: Negative except as noted in the HPI.   Objective:  Vitals:   06/13/22 1030 06/13/22 1053  BP: (!) 159/76 (!) 142/78   GODFREY TRITSCHLER is a pleasant 82 y.o. male in NAD. AAO X 3.  Vascular Examination: CFT <3 seconds b/l. DP/PT pulses faintly palpable b/l. Skin temperature gradient warm to warm b/l. No pain with calf compression. No ischemia or gangrene. No cyanosis or clubbing noted b/l. No edema noted b/l LE.   Neurological Examination:  Protective sensation intact 5/5 intact bilaterally with 10g monofilament b/l. Vibratory sensation intact b/l. Proprioception intact bilaterally.  Dermatological Examination: Pedal skin warm and supple b/l.   No open wounds. No interdigital macerations.  Toenails 1-5 b/l thick, discolored, elongated with subungual debris and pain on dorsal palpation.    Pedal skin is warm and supple b/l LE. No open wounds b/l LE. No interdigital macerations noted b/l LE. Toenails 1-5 b/l elongated, discolored, dystrophic, thickened, crumbly with subungual debris and tenderness to dorsal palpation. Hyperkeratotic lesion(s) 1st metatarsal head right foot.  No erythema, no edema, no drainage, no fluctuance. Porokeratotic lesion(s) submet head 2 left foot and submet head 3 right foot. No erythema, no edema, no drainage, no fluctuance.  Musculoskeletal Examination: Muscle  strength 5/5 to b/l LE. HAV with bunion bilaterally and hammertoes 2-5 b/l. Pes planus deformity noted bilateral LE. Patient ambulates independent of any assistive aids.  Radiographs: None  Last A1c:      Latest Ref Rng & Units 06/03/2022    2:43 AM  Hemoglobin A1C  Hemoglobin-A1c 4.8 - 5.6 % 6.8    Footwear Assessment: Does the patient wear appropriate shoes? Yes. Does the patient need inserts/orthotics? Yes.  Lab Results  Component Value Date   HGBA1C 6.8 (H) 06/03/2022   ADA Risk Categorization: High Risk  Patient has one or more of the following: Loss of protective sensation Absent pedal pulses Severe Foot deformity History of foot ulcer  Assessment: 1. Pain due to onychomycosis of toenails of both feet   2. Porokeratosis   3. Callus   4. Acquired pes planovalgus, unspecified laterality   5. Diabetic peripheral neuropathy associated with type 2 diabetes mellitus ([place])   6. Encounter for diabetic foot exam ([place])     Plan: No orders of the defined types were placed in this encounter. -Patient was evaluated and treated. All patient's and/or POA's questions/concerns answered on today's visit. -Diabetic foot examination performed today. -Continue foot and shoe inspections daily. Monitor blood glucose per PCP/Endocrinologist's recommendations. -Patient to continue soft, supportive shoe gear daily. -Toenails 1-5 b/l were debrided in length and girth with sterile nail nippers and dremel without iatrogenic bleeding.  -Callus(es) 1st metatarsal head right foot pared utilizing sterile scalpel blade without complication or incident. Total number debrided =1. -Porokeratotic lesion(s) submet head 2 left foot and submet head 3 right foot pared and enucleated with sterile currette without incident. Total number of lesions debrided=2. -Patient/POA to call should there be question/concern in the interim. Return in about 3 months (around 09/11/2022).  Marzetta Board, DPM

## 2022-06-13 NOTE — Progress Notes (Signed)
[address]       [place] [address]             [phone]                    [name] [facility] Medical Record #[id number] Date of Birth: 1940/06/29  Referring: Lujean Amel, MD Primary Care: Lujean Amel, MD Primary Cardiologist: Candee Furbish, MD  Chief Complaint:    Chief Complaint  Patient presents with   Lung Mass    New patient consultation, chest CT 12/28, MRI brain 1/15, PET 1/19    History of Present Illness:    TEL HEVIA 82 y.o. 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  He also has a history of bioprosthetic aortic valve replacement in 2015.  He has no complaints today.     Past Medical History:  Diagnosis Date   Acute epididymo-orchitis    Aortic stenosis    s/p bioprosthetic AVR (Dr. Cyndia Bent) 11/2013   Arthritis    Diabetes mellitus without complication [facility])    ED (erectile dysfunction)    Essential hypertension, benign    Heart murmur    Hep C w/o coma, chronic ([place])    History of blood transfusion    History of DVT of lower extremity    Hx of echocardiogram    Echo (8/15):  Severe LVH, EF 60-65%, no RWMA, Gr 3 DD, AVR ok (mean 21 mmHg), MAC, mild LAE, mild to mod RAE   Hypercholesterolemia    PONV (postoperative nausea and vomiting)     Past Surgical History:  Procedure Laterality Date   AORTIC VALVE REPLACEMENT N/A 11/19/2013   Procedure: AORTIC VALVE REPLACEMENT (AVR);  Surgeon: Gaye Pollack, MD;  Location: [place];  Service: Open Heart Surgery;  Laterality: N/A;   BUNIONECTOMY Bilateral 05/09/2006   CARDIAC CATHETERIZATION  10/01/2013   COLONOSCOPY  01/07/2010   internal hemorrhoids.  Dr Deatra Ina   ESOPHAGOGASTRODUODENOSCOPY N/A 04/24/2013   Procedure: ESOPHAGOGASTRODUODENOSCOPY (EGD);  Surgeon: Jerene Bears, MD;  Location[facility] ENDOSCOPY;  Service: Endoscopy;  Laterality: N/A;   INGUINAL HERNIA REPAIR Right    INTRAOPERATIVE TRANSESOPHAGEAL ECHOCARDIOGRAM  N/A 11/19/2013   Procedure: INTRAOPERATIVE TRANSESOPHAGEAL ECHOCARDIOGRAM;  Surgeon: Gaye Pollack, MD;  Location: [facility] OR;  Service: Open Heart Surgery;  Laterality: N/A;   KNEE ARTHROSCOPY Left 05/09/2009   Dr Collier Salina   LAMINECTOMY  06/09/2005   Dr Joya Salm. multiple lumbar level laminectomies.    LEFT AND RIGHT HEART CATHETERIZATION WITH CORONARY ANGIOGRAM N/A 10/01/2013   Procedure: LEFT AND RIGHT HEART CATHETERIZATION WITH CORONARY ANGIOGRAM;  Surgeon: Candee Furbish, MD;  Location: [facility] CATH LAB;  Service: Cardiovascular;  Laterality: N/A;   SPINAL FUSION     lumbar   TEE WITHOUT CARDIOVERSION  11/07/2003   Aoritic valve sclerosis.    TONSILLECTOMY     VIDEO BRONCHOSCOPY WITH ENDOBRONCHIAL ULTRASOUND N/A 06/02/2022   Procedure: VIDEO BRONCHOSCOPY WITH ENDOBRONCHIAL ULTRASOUND;  Surgeon: Margaretha Seeds, MD;  Location: [place];  Service: Thoracic;  Laterality: N/A;    Family History  Problem Relation Age of Onset   Diabetes Father    Stroke Father    Diabetes Mother    Stroke Mother    Hypertension Unknown        family history   Heart attack Neg Hx      Social History   Tobacco Use  Smoking Status Former  Packs/day: 1.50   Years: 31.00   Total pack years: 46.50   Types: Cigarettes   Quit date: 29   Years since quitting: 31.1  Smokeless Tobacco Never    Social History   Substance and Sexual Activity  Alcohol Use No     Allergies  Allergen Reactions   Azithromycin Swelling    Angioedema 05/2022    Lisinopril     Angioedema 05/2022   Ezetimibe-Simvastatin     Leg pains Restlessness at night     Metoprolol     bradycardia   Rosuvastatin     Discomfort, RESTLESS, CAN'T SLEEP     Current Outpatient Medications  Medication Sig Dispense Refill   acetaminophen (TYLENOL) 500 MG tablet Take 500 mg by mouth every 6 (six) hours as needed for mild pain or moderate pain.     amLODipine (NORVASC) 10 MG tablet Take 1 tablet (10 mg total) by mouth daily. 30 tablet 0    aspirin EC 81 MG EC tablet Take 1 tablet (81 mg total) by mouth daily.     atorvastatin (LIPITOR) 20 MG tablet Take 20 mg by mouth daily.     dextromethorphan-guaiFENesin (MUCINEX DM) 30-600 MG 12hr tablet Take 1 tablet by mouth 2 (two) times daily as needed (congestion).     glucose blood test strip      glucose blood test strip as directed DX 250     hydrochlorothiazide (HYDRODIURIL) 12.5 MG tablet Take 1 tablet (12.5 mg total) by mouth daily. 30 tablet 0   metFORMIN (GLUCOPHAGE) 1000 MG tablet Take 1,000 mg by mouth 2 (two) times daily with a meal.     Multiple Vitamin (MULTIVITAMIN WITH MINERALS) TABS tablet Take 1 tablet by mouth daily.     prochlorperazine (COMPAZINE) 10 MG tablet Take 1 tablet (10 mg total) by mouth every 6 (six) hours as needed. 30 tablet 2   SF 5000 PLUS 1.1 % CREA dental cream Place 1 Application onto teeth daily.  2   tamsulosin (FLOMAX) 0.4 MG CAPS capsule Take 0.4 mg by mouth daily.     No current facility-administered medications for this visit.    Review of Systems  Constitutional:  Negative for malaise/fatigue and weight loss.  Respiratory:  Negative for cough and shortness of breath.   Cardiovascular:  Negative for chest pain.     PHYSICAL EXAMINATION: BP 138/71 (BP Location: Left Arm, Patient Position: Sitting, Cuff Size: Normal)   Pulse 72   Resp 20   Ht 5\' 11"  (1.803 m)   Wt 134 lb (60.8 kg)   SpO2 99% Comment: RA  BMI 18.69 kg/m  Physical Exam Constitutional:      General: He is not in acute distress.    Appearance: Normal appearance. He is not ill-appearing.  Eyes:     Extraocular Movements: Extraocular movements intact.  Cardiovascular:     Rate and Rhythm: Normal rate.  Pulmonary:     Effort: Pulmonary effort is normal. No respiratory distress.  Abdominal:     General: Abdomen is flat. There is no distension.  Musculoskeletal:        General: Normal range of motion.     Cervical back: Normal range of motion.  Skin:    General:  Skin is warm and dry.  Neurological:     General: No focal deficit present.     Mental Status: He is alert and oriented to person, place, and time.     Diagnostic Studies & Laboratory data:  Recent Radiology Findings:   NM PET Image Initial (PI) Skull Base To Thigh  Result Date: 05/27/2022 CLINICAL DATA:  Initial treatment strategy for non-small-cell lung cancer. EXAM: NUCLEAR MEDICINE PET SKULL BASE TO THIGH TECHNIQUE: 627 mCi F-18 FDG was injected intravenously. Full-ring PET imaging was performed from the skull base to thigh after the radiotracer. CT data was obtained and used for attenuation correction and anatomic localization. Fasting blood glucose: 119 mg/dl COMPARISON:  Chest CT 05/05/2022 FINDINGS: Mediastinal blood pool activity: SUV max 2.0 Liver activity: SUV max NA NECK: No hypermetabolic lymph nodes in the neck. Incidental CT findings: None. CHEST: Patient's known right apical mass is markedly hypermetabolic with SUV max = 58.5. 11 mm precarinal node visible on image 27/7 is hypermetabolic with SUV max = 4.6. Lesion extends into the upper right hilum. Incidental CT findings: Centrilobular and paraseptal emphysema evident. 4 mm anterior right upper lobe nodule on 67/4 is stable, below PET threshold for resolution. Coronary artery calcification is evident. Status post aortic valve replacement. Mitral annular calcification evident. Mild atherosclerotic calcification is noted in the wall of the thoracic aorta. ABDOMEN/PELVIS: No abnormal hypermetabolic activity within the liver, pancreas, adrenal glands, or spleen. No hypermetabolic lymph nodes in the abdomen or pelvis. Physiologic uptake noted in small bowel loops of the pelvis Incidental CT findings: Bilateral renal cysts evident. There is moderate atherosclerotic calcification of the abdominal aorta without aneurysm. Moderate to large stool volume. Circumferential bladder wall thickening evident likely accentuated by underdistention.  Prostate gland is enlarged. SKELETON: No focal hypermetabolic activity to suggest skeletal metastasis. Incidental CT findings: Degenerative changes noted in both shoulders. IMPRESSION: 1. Right apical mass is markedly hypermetabolic consistent with known primary bronchogenic neoplasm. 2. Hypermetabolic precarinal lymph node is compatible with metastatic disease. 3. No evidence for hypermetabolic metastatic disease in the neck, abdomen, or pelvis. 4. 4 mm anterior right upper lobe pulmonary nodule is stable, below PET threshold for resolution. Attention on follow-up recommended. 5. Prostatomegaly with circumferential bladder wall thickening likely accentuated by underdistention. 6. Aortic Atherosclerosis (ICD10-I70.0) and Emphysema (ICD10-J43.9). Electronically Signed   By: Misty Stanley M.D.   On: 05/27/2022 13:37   MR Brain W Wo Contrast  Result Date: 05/24/2022 CLINICAL DATA:  Lung carcinoma staging EXAM: MRI HEAD WITHOUT AND WITH CONTRAST TECHNIQUE: Multiplanar, multiecho pulse sequences of the brain and surrounding structures were obtained without and with intravenous contrast. CONTRAST:  12mL GADAVIST GADOBUTROL 1 MMOL/ML IV SOLN COMPARISON:  None Available. FINDINGS: Brain: No acute infarction, hemorrhage, hydrocephalus, extra-axial collection or mass lesion. There is multifocal hyperintense T2-weighted signal within the periventricular and deep white matter. 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 No chronic microhemorrhage. Midline structures are normal. Normal CSF spaces. No abnormal contrast enhancement. Vascular: Normal flow voids. Skull and upper cervical spine: Normal marrow signal. Sinuses/Orbits: Negative. Other: None. IMPRESSION: 1. No intracranial metastatic disease. 2. 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 Electronically Signed   By: Ulyses Jarred M.D.   On: 05/24/2022 01:39       I have independently reviewed  the above radiology studies  and reviewed the findings with the patient.   Recent Lab Findings: Lab Results  Component Value Date   WBC 11.8 (H) 06/09/2022   HGB 9.8 (L) 06/09/2022   HCT 30.4 (L) 06/09/2022   PLT 364 06/09/2022   GLUCOSE 158 (H) 06/09/2022   ALT 8 06/09/2022   AST 12 (L) 06/09/2022   NA  136 06/09/2022   K 4.8 06/09/2022   CL 102 06/09/2022   CREATININE 0.84 06/09/2022   BUN 20 06/09/2022   CO2 27 06/09/2022   TSH 0.283 (L) 06/05/2022   INR 1.53 (H) 11/19/2013   HGBA1C 6.8 (H) 06/03/2022     Assessment / Plan:   82 year old male with a 5.8 cm right upper lobe mass.  Biopsies are consistent with squamous cell.  Biopsies of subcarinal and mediastinal lymph nodes are negative.  On review of his imaging this is a very large mass that appears to be growing into the mediastinum.  On the images it is right next to the upper portion of the esophagus.  He also has avidity is precarinal lymph nodes on PET/CT.  We covered the risks and benefits of surgical resection I explained to him that given the size there is a good likelihood that with the mediastinal invasion he would not derive much benefit from resection.  Also given his age and frailty I do not think that he would tolerate an operation.  He stated that he did not want to proceed with any surgical resection even if he was a candidate.  He will continue to follow-up with medical oncology     I  spent 30 minutes with  the patient face to face in counseling and coordination of care.    Lajuana Matte 06/13/2022 2:35 PM

## 2022-06-13 NOTE — Progress Notes (Signed)
Pharmacist Chemotherapy Monitoring - Initial Assessment    Anticipated start date: 06/20/22   The following has been reviewed per standard work regarding the patient's treatment regimen: The patient's diagnosis, treatment plan and drug doses, and organ/hematologic function Lab orders and baseline tests specific to treatment regimen  The treatment plan start date, drug sequencing, and pre-medications Prior authorization status  Patient's documented medication list, including drug-drug interaction screen and prescriptions for anti-emetics and supportive care specific to the treatment regimen The drug concentrations, fluid compatibility, administration routes, and timing of the medications to be used The patient's access for treatment and lifetime cumulative dose history, if applicable  The patient's medication allergies and previous infusion related reactions, if applicable   Changes made to treatment plan:  N/A  Follow up needed:  N/A   Philomena Course, RPH, 06/13/2022  1:52 PM

## 2022-06-14 ENCOUNTER — Telehealth: Payer: Self-pay | Admitting: Internal Medicine

## 2022-06-14 NOTE — Progress Notes (Signed)
Radiation Oncology         [phone] ________________________________  Initial Outpatient Consultation  Name: [name] MRN: [id number]  Date: 06/15/2022  DOB: 01/23/1941  VE:LFYBOFB, [name], MD  Curt Bears, MD   REFERRING PHYSICIAN: Curt Bears, MD  DIAGNOSIS: There were no encounter diagnoses.  Stage III (T3, N2, M0) non-small cell right lung cancer, squamous cell carcinoma.  The patient presented with a large centrally necrotic right upper lobe lung mass abutting the mediastinum, T3 and T4 vertebral bodies, and a hypermetabolic precarinal lymph node.    HISTORY OF [name] is a 82 y.o. male who is accompanied by ***. he is seen as a courtesy of Dr. Julien Nordmann for an opinion concerning radiation therapy as part of management for his recently diagnosed right lung cancer. The patient is a former smoker and quit smoking approximately 30 years ago. He estimates that he smoked less than 1 pack of cigarettes per day during that time.  The patient presented to his PCP on 04/28/22 for evaluation of congestion and cough x several weeks, particularly at nighttime. Chest x-ray performed on the date of this visit showed a 3.7 x 5.6 x 3.1 cm right paratracheal density with mass effect on the trachea, concerning for a mass lesion. No other acute cardiopulmonary findings were appreciated.   X-ray findings prompted a chest CT on 05/05/22 which further demonstrated a large centrally necrotic mass medially at the right lung apex as highly suspicious for primary bronchogenic carcinoma. The mass was also seen to abut the superior mediastinum and T3 and T4 vertebral bodies. CT otherwise showed no definite mediastinal invasion or bony destruction by the mass, and no evidence of distant metastatic disease. However, some scattered small mediastinal, hilar and supraclavicular lymph nodes were appreciated, as well as a small nonspecific separate RUL nodule.   Accordingly, the  patient was referred to medical oncology for further management. During his initial consultation visit with Heilingoetter PA on 05/12/22, the patient endorsed an ongoing cough, sinus drainage at nighttime, and an intermittent mild frontal headache which occurred that morning lasting for approximately 1 hour. This apparently resolved spontaneously without any intervention. The patient was also seen by Dr. Julien Nordmann on 05/12/22, who recommended proceeding with a referral to pulmonary medicine for tissue sampling, along with staging work-up consisting of a PET scan and MRI of brain.   MRI of the brain with and without contrast on 05/23/22 showed no evidence of intracranial metastatic disease.   PET scan on 05/27/22 showed: marked hypermetabolism associated with the right apical mass (consistent with the known primary bronchogenic neoplasm); a hypermetabolic precarinal lymph node compatible with metastatic disease; a stable 4 mm anterior right upper lobe pulmonary nodule; and no evidence of hypermetabolic metastatic disease in the neck, abdomen, or pelvis.    The patient was promptly referred to pulmonary medicine and opted to proceed with a bronchoscopy w/ biopsies on 06/02/22 under Dr. Loanne Drilling. Biopsies of the right paratracheal mass and RUL lavage collected revealed findings consistent with squamous cell carcinoma. FNA of a station 7 lymph node and lymph node 4R showed no evidence of malignancy.   Following his bronchoscopy, the patient was given azithromycin and developed an allergic reaction characterized by tongue swelling. He was subsequently admitted from 06/02/22 through 06/06/22 for management. Hospital course included steroids, Benadryl, epinephrine, tranexamic acid, and C1 esterase inhibitor with improvement of his tongue swelling. Prior to discharge, his symptoms completely resolved.    Following a discussion with Dr. Julien Nordmann  on 06/09/22, the patient has opted to proceed with concurrent  chemoradiation with weekly carboplatin and paclitaxel (for 6-7 weeks), followed by consolidation treatment with immunotherapy,( if he has no evidence for disease progression).   The patient has also met with thoracic surgery to discuss resectioning. Per his initial consultation with Dr. Kipp Brood on 06/10/22, the patient would not be good surgical candidate given his age, tumor size, and low surgical benefit it the setting of mediastinal invasion. The patient also stated that he had no intention of pursuing surgical resectioning even if he was a surgical candidate.   Of note: The patient has a history of hepatocellular carcinoma which was treated at [facility] on 05/09/2018 (per patient report).  His most recent MRI of the abdomen performed on 10/09/2020 did show a new 7 mm lesion in segment 8 which was noted to be indeterminate for a small focus of hepatocellular carcinoma. MRI otherwise showed no other suspicious hepatic lesions and a stable presumed ablation defect inferiorly in the right hepatic lobe (segment 5).   PREVIOUS RADIATION THERAPY: No  PAST MEDICAL HISTORY:  Past Medical History:  Diagnosis Date   Acute epididymo-orchitis    Aortic stenosis    s/p bioprosthetic AVR (Dr. Cyndia Bent) 11/2013   Arthritis    Diabetes mellitus without complication [facility])    ED (erectile dysfunction)    Essential hypertension, benign    Heart murmur    Hep C w/o coma, chronic ([place])    History of blood transfusion    History of DVT of lower extremity    Hx of echocardiogram    Echo (8/15):  Severe LVH, EF 60-65%, no RWMA, Gr 3 DD, AVR ok (mean 21 mmHg), MAC, mild LAE, mild to mod RAE   Hypercholesterolemia    PONV (postoperative nausea and vomiting)     PAST SURGICAL HISTORY: Past Surgical History:  Procedure Laterality Date   AORTIC VALVE REPLACEMENT N/A 11/19/2013   Procedure: AORTIC VALVE REPLACEMENT (AVR);  Surgeon: Gaye Pollack, MD;  Location: [place];  Service: Open Heart Surgery;  Laterality: N/A;    BUNIONECTOMY Bilateral 05/09/2006   CARDIAC CATHETERIZATION  10/01/2013   COLONOSCOPY  01/07/2010   internal hemorrhoids.  Dr Deatra Ina   ESOPHAGOGASTRODUODENOSCOPY N/A 04/24/2013   Procedure: ESOPHAGOGASTRODUODENOSCOPY (EGD);  Surgeon: Jerene Bears, MD;  Location[facility] ENDOSCOPY;  Service: Endoscopy;  Laterality: N/A;   INGUINAL HERNIA REPAIR Right    INTRAOPERATIVE TRANSESOPHAGEAL ECHOCARDIOGRAM N/A 11/19/2013   Procedure: INTRAOPERATIVE TRANSESOPHAGEAL ECHOCARDIOGRAM;  Surgeon: Gaye Pollack, MD;  Location: [place] OR;  Service: Open Heart Surgery;  Laterality: N/A;   KNEE ARTHROSCOPY Left 05/09/2009   Dr Collier Salina   LAMINECTOMY  06/09/2005   Dr Joya Salm. multiple lumbar level laminectomies.    LEFT AND RIGHT HEART CATHETERIZATION WITH CORONARY ANGIOGRAM N/A 10/01/2013   Procedure: LEFT AND RIGHT HEART CATHETERIZATION WITH CORONARY ANGIOGRAM;  Surgeon: Candee Furbish, MD;  Location: [facility] CATH LAB;  Service: Cardiovascular;  Laterality: N/A;   SPINAL FUSION     lumbar   TEE WITHOUT CARDIOVERSION  11/07/2003   Aoritic valve sclerosis.    TONSILLECTOMY     VIDEO BRONCHOSCOPY WITH ENDOBRONCHIAL ULTRASOUND N/A 06/02/2022   Procedure: VIDEO BRONCHOSCOPY WITH ENDOBRONCHIAL ULTRASOUND;  Surgeon: Margaretha Seeds, MD;  Location: [place];  Service: Thoracic;  Laterality: N/A;    FAMILY HISTORY:  Family History  Problem Relation Age of Onset   Diabetes Father    Stroke Father    Diabetes Mother    Stroke Mother  Hypertension Unknown        family history   Heart attack Neg Hx     SOCIAL HISTORY:  Social History   Tobacco Use   Smoking status: Former    Packs/day: 1.50    Years: 31.00    Total pack years: 46.50    Types: Cigarettes    Quit date: 1993    Years since quitting: 31.1   Smokeless tobacco: Never  Vaping Use   Vaping Use: Never used  Substance Use Topics   Alcohol use: No   Drug use: No    ALLERGIES:  Allergies  Allergen Reactions   Azithromycin Swelling    Angioedema  05/2022    Lisinopril     Angioedema 05/2022   Ezetimibe-Simvastatin     Leg pains Restlessness at night     Metoprolol     bradycardia   Rosuvastatin     Discomfort, RESTLESS, CAN'T SLEEP     MEDICATIONS:  Current Outpatient Medications  Medication Sig Dispense Refill   acetaminophen (TYLENOL) 500 MG tablet Take 500 mg by mouth every 6 (six) hours as needed for mild pain or moderate pain.     amLODipine (NORVASC) 10 MG tablet Take 1 tablet (10 mg total) by mouth daily. 30 tablet 0   aspirin EC 81 MG EC tablet Take 1 tablet (81 mg total) by mouth daily.     atorvastatin (LIPITOR) 20 MG tablet Take 20 mg by mouth daily.     dextromethorphan-guaiFENesin (MUCINEX DM) 30-600 MG 12hr tablet Take 1 tablet by mouth 2 (two) times daily as needed (congestion).     glucose blood test strip      glucose blood test strip as directed DX 250     hydrochlorothiazide (HYDRODIURIL) 12.5 MG tablet Take 1 tablet (12.5 mg total) by mouth daily. 30 tablet 0   metFORMIN (GLUCOPHAGE) 1000 MG tablet Take 1,000 mg by mouth 2 (two) times daily with a meal.     Multiple Vitamin (MULTIVITAMIN WITH MINERALS) TABS tablet Take 1 tablet by mouth daily.     prochlorperazine (COMPAZINE) 10 MG tablet Take 1 tablet (10 mg total) by mouth every 6 (six) hours as needed. 30 tablet 2   SF 5000 PLUS 1.1 % CREA dental cream Place 1 Application onto teeth daily.  2   tamsulosin (FLOMAX) 0.4 MG CAPS capsule Take 0.4 mg by mouth daily.     No current facility-administered medications for this encounter.    REVIEW OF SYSTEMS:  A 10+ POINT REVIEW OF SYSTEMS WAS OBTAINED including neurology, dermatology, psychiatry, cardiac, respiratory, lymph, extremities, GI, GU, musculoskeletal, constitutional, reproductive, HEENT. ***   PHYSICAL EXAM:  vitals were not taken for this visit.   General: Alert and oriented, in no acute distress HEENT: Head is normocephalic. Extraocular movements are intact. Oropharynx is clear. Neck: Neck is  supple, no palpable cervical or supraclavicular lymphadenopathy. Heart: Regular in rate and rhythm with no murmurs, rubs, or gallops. Chest: Clear to auscultation bilaterally, with no rhonchi, wheezes, or rales. Abdomen: Soft, nontender, nondistended, with no rigidity or guarding. Extremities: No cyanosis or edema. Lymphatics: see Neck Exam Skin: No concerning lesions. Musculoskeletal: symmetric strength and muscle tone throughout. Neurologic: Cranial nerves II through XII are grossly intact. No obvious focalities. Speech is fluent. Coordination is intact. Psychiatric: Judgment and insight are intact. Affect is appropriate. ***  ECOG = ***  0 - Asymptomatic (Fully active, able to carry on all predisease activities without restriction)  1 - Symptomatic but completely  ambulatory (Restricted in physically strenuous activity but ambulatory and able to carry out work of a light or sedentary nature. For example, light housework, office work)  2 - Symptomatic, <50% in bed during the day (Ambulatory and capable of all self care but unable to carry out any work activities. Up and about more than 50% of waking hours)  3 - Symptomatic, >50% in bed, but not bedbound (Capable of only limited self-care, confined to bed or chair 50% or more of waking hours)  4 - Bedbound (Completely disabled. Cannot carry on any self-care. Totally confined to bed or chair)  5 - Death   Eustace Pen MM, Creech RH, Tormey DC, et al. [phone]). "Toxicity and response criteria of the [facility]". [place] Oncol. 5 (6): 649-55  LABORATORY DATA:  Lab Results  Component Value Date   WBC 11.8 (H) 06/09/2022   HGB 9.8 (L) 06/09/2022   HCT 30.4 (L) 06/09/2022   MCV 77.9 (L) 06/09/2022   PLT 364 06/09/2022   NEUTROABS 9.2 (H) 06/09/2022   Lab Results  Component Value Date   NA 136 06/09/2022   K 4.8 06/09/2022   CL 102 06/09/2022   CO2 27 06/09/2022   GLUCOSE 158 (H) 06/09/2022   BUN 20 06/09/2022    CREATININE 0.84 06/09/2022   CALCIUM 10.8 (H) 06/09/2022      RADIOGRAPHY: NM PET Image Initial (PI) Skull Base To Thigh  Result Date: 05/27/2022 CLINICAL DATA:  Initial treatment strategy for non-small-cell lung cancer. EXAM: NUCLEAR MEDICINE PET SKULL BASE TO THIGH TECHNIQUE: 627 mCi F-18 FDG was injected intravenously. Full-ring PET imaging was performed from the skull base to thigh after the radiotracer. CT data was obtained and used for attenuation correction and anatomic localization. Fasting blood glucose: 119 mg/dl COMPARISON:  Chest CT 05/05/2022 FINDINGS: Mediastinal blood pool activity: SUV max 2.0 Liver activity: SUV max NA NECK: No hypermetabolic lymph nodes in the neck. Incidental CT findings: None. CHEST: Patient's known right apical mass is markedly hypermetabolic with SUV max = 46.9. 11 mm precarinal node visible on image 50/7 is hypermetabolic with SUV max = 4.6. Lesion extends into the upper right hilum. Incidental CT findings: Centrilobular and paraseptal emphysema evident. 4 mm anterior right upper lobe nodule on 67/4 is stable, below PET threshold for resolution. Coronary artery calcification is evident. Status post aortic valve replacement. Mitral annular calcification evident. Mild atherosclerotic calcification is noted in the wall of the thoracic aorta. ABDOMEN/PELVIS: No abnormal hypermetabolic activity within the liver, pancreas, adrenal glands, or spleen. No hypermetabolic lymph nodes in the abdomen or pelvis. Physiologic uptake noted in small bowel loops of the pelvis Incidental CT findings: Bilateral renal cysts evident. There is moderate atherosclerotic calcification of the abdominal aorta without aneurysm. Moderate to large stool volume. Circumferential bladder wall thickening evident likely accentuated by underdistention. Prostate gland is enlarged. SKELETON: No focal hypermetabolic activity to suggest skeletal metastasis. Incidental CT findings: Degenerative changes noted  in both shoulders. IMPRESSION: 1. Right apical mass is markedly hypermetabolic consistent with known primary bronchogenic neoplasm. 2. Hypermetabolic precarinal lymph node is compatible with metastatic disease. 3. No evidence for hypermetabolic metastatic disease in the neck, abdomen, or pelvis. 4. 4 mm anterior right upper lobe pulmonary nodule is stable, below PET threshold for resolution. Attention on follow-up recommended. 5. Prostatomegaly with circumferential bladder wall thickening likely accentuated by underdistention. 6. Aortic Atherosclerosis (ICD10-I70.0) and Emphysema (ICD10-J43.9). Electronically Signed   By: Misty Stanley M.D.   On: 05/27/2022 13:37  MR Brain W Wo Contrast  Result Date: 05/24/2022 CLINICAL DATA:  Lung carcinoma staging EXAM: MRI HEAD WITHOUT AND WITH CONTRAST TECHNIQUE: Multiplanar, multiecho pulse sequences of the brain and surrounding structures were obtained without and with intravenous contrast. CONTRAST:  46mL GADAVIST GADOBUTROL 1 MMOL/ML IV SOLN COMPARISON:  None Available. FINDINGS: Brain: No acute infarction, hemorrhage, hydrocephalus, extra-axial collection or mass lesion. There is multifocal hyperintense T2-weighted signal within the periventricular and deep white matter. 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 No chronic microhemorrhage. Midline structures are normal. Normal CSF spaces. No abnormal contrast enhancement. Vascular: Normal flow voids. Skull and upper cervical spine: Normal marrow signal. Sinuses/Orbits: Negative. Other: None. IMPRESSION: 1. No intracranial metastatic disease. 2. 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 Electronically Signed   By: Ulyses Jarred M.D.   On: 05/24/2022 01:39      IMPRESSION: Stage III (T3, N2, M0) non-small cell right lung cancer, squamous cell carcinoma.  The patient presented with a large centrally necrotic right upper lobe lung mass abutting  the mediastinum, T3 and T4 vertebral bodies, and a hypermetabolic precarinal lymph node.    ***  Today, I talked to the patient and family about the findings and work-up thus far.  We discussed the natural history of *** and general treatment, highlighting the role of radiotherapy in the management.  We discussed the available radiation techniques, and focused on the details of logistics and delivery.  We reviewed the anticipated acute and late sequelae associated with radiation in this setting.  The patient was encouraged to ask questions that I answered to the best of my ability. *** A patient consent form was discussed and signed.  We retained a copy for our records.  The patient would like to proceed with radiation and will be scheduled for CT simulation.  PLAN: ***    *** minutes of total time was spent for this patient encounter, including preparation, face-to-face counseling with the patient and coordination of care, physical exam, and documentation of the encounter.   ------------------------------------------------  Blair Promise, PhD, MD  This document serves as a record of services personally performed by Gery Pray, MD. It was created on his behalf by Roney Mans, a trained medical scribe. The creation of this record is based on the scribe's personal observations and the provider's statements to them. This document has been checked and approved by the attending provider.

## 2022-06-14 NOTE — Progress Notes (Signed)
Thoracic Location of Tumor / Histology:  Stage III (T3, N2, M0) non-small cell lung cancer, squamous cell carcinoma.   large centrally necrotic right upper lobe lung mass abutting the mediastinum, T3 and T4 vertebral bodies  hypermetabolic precarinal lymph node.   Patient presented with symptoms of: per 05/12/22 MedOnc consult note: "The patient had an appointment with his PCP on 04/28/2022 for the chief complaint of congestion and cough for several weeks, particularly at nighttime.  He had a chest x-ray performed the same day which showed a 3.7 x 5.6 x 3.1 cm right paratracheal density with mass effect on the trachea which is concerning for a mass lesion."  PET Scan 05/27/2022 IMPRESSION: Right apical mass is markedly hypermetabolic consistent with known primary bronchogenic neoplasm. Hypermetabolic precarinal lymph node is compatible with metastatic disease. No evidence for hypermetabolic metastatic disease in the neck, abdomen, or pelvis. 4 mm anterior right upper lobe pulmonary nodule is stable, below PET threshold for resolution. Attention on follow-up recommended. Prostatomegaly with circumferential bladder wall thickening likely accentuated by underdistention. Aortic Atherosclerosis (ICD10-I70.0) and Emphysema (ICD10-J43.9)  CT Chest w/ Contrast 05/05/2022 --IMPRESSION: Large centrally necrotic mass medially at the right lung apex, highly suspicious for primary bronchogenic carcinoma. This abuts the superior mediastinum and T3 and T4 vertebral bodies, but causes no definite mediastinal invasion or bone destruction. Scattered small mediastinal, hilar and supraclavicular lymph nodes, nonspecific. No evidence of distant metastatic disease. Small nonspecific separate right upper lobe pulmonary nodule. PET-CT may be helpful for staging. Aortic Atherosclerosis (ICD10-I70.0) and Emphysema (ICD10-J43.9)  Biopsies revealed:  06/02/2022 A. RIGHT PARATRACHEAL, BRUSHING:  - Malignant cells  consistent with squamous cell carcinoma  C. RIGHT PARATRACHEAL MASS, FINE NEEDLE ASPIRATION:  - Malignant cells consistent with squamous cell carcinoma  D. LYMPH NODE, STATION 7, FINE NEEDLE ASPIRATION:  - No malignant cells identified  E. LYMPH NODE, 4R, FINE NEEDLE ASPIRATION:  - No malignant cells identified   Tobacco/Marijuana/Snuff/ETOH use: Patient is a former smoker (history ~1 pack/day for 30 years, quit in 1993). Reports occasional alcohol consumption (socially). Denies any recreational drug use  Past/Anticipated interventions by cardiothoracic surgery, if any:  06/10/2022 --Dr. Melodie Bouillon (office visit)  82 year old male with a 5.8 cm right upper lobe mass.   Biopsies are consistent with squamous cell.   Biopsies of subcarinal and mediastinal lymph nodes are negative.   On review of his imaging this is a very large mass that appears to be growing into the mediastinum.   On the images it is right next to the upper portion of the esophagus.   He also has avidity is precarinal lymph nodes on PET/CT.   We covered the risks and benefits of surgical resection I explained to him that given the size there is a good likelihood that with the mediastinal invasion he would not derive much benefit from resection.   Also given his age and frailty I do not think that he would tolerate an operation.   He stated that he did not want to proceed with any surgical resection even if he was a candidate.   He will continue to follow-up with medical oncology  06/02/2022 --Dr. Gwenlyn Found Endobronchial ultrasound and fiberoptic bronchoscopy and biopsies   Past/Anticipated interventions by medical oncology, if any:  Under care of Dr. Curt Bears  06/09/2022 (Cassie H, PA-C office note) --CURRENT THERAPY:  Concurrent chemoradiation with carboplatin for an AUC of 2 and paclitaxel 45 mg/m starting 06/20/2022. --ASSESSMENT/PLAN:  Of note, the patient does have a history  of hepatocellular  carcinoma which was treated with ablation at [facility] on 05/09/2018 per patient report.  He presented with a right apical lung mass and suspicious precarinal lymph node involvement.   PD-L1 was requested today.   Given the large and centrally located mass, it is unlikely that the patient will be a surgical candidate.   He is scheduled for formal evaluation with CT surgery tomorrow.  Dr. Julien Nordmann discussed today that we recommend treatment with concurrent chemoradiation with carboplatin for an AUC of 2 and paclitaxel 45 mg/m.   The patient is interested in this and we will tentatively arrange for his first cycle of treatment on 06/20/2022. We will arrange for chemo education class prior to receiving his first cycle of treatment. I have placed a referral to radiation oncology. I will arrange for iron infusions with Venofer 300 mg weekly x 3 starting next week for his persistent iron deficiency Will see the patient back for follow-up visit with his second week of his treatment.   Signs/Symptoms Weight changes, if any: None Respiratory complaints, if any: He reports SOB with increased activities. Hemoptysis, if any: He reports occasional productive cough with clear phlegm.  He states his cough is mostly dry, feels like a tickle in his throat.  Denies hemoptysis. Pain issues, if any:  Right upper back, near collar bone.  He is taking tylenol.  SAFETY ISSUES: Prior radiation? No Pacemaker/ICD? No  Possible current pregnancy? N/A Is the patient on methotrexate? No  Current Complaints / other details:   MRI Brain w/ & w/o Contrast 05/23/2022 --IMPRESSION: No intracranial metastatic disease. 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

## 2022-06-14 NOTE — Telephone Encounter (Signed)
Scheduled per 02/01 los and work-queue, patient has been called and notified of upcoming appointment.

## 2022-06-15 ENCOUNTER — Ambulatory Visit
Admission: RE | Admit: 2022-06-15 | Discharge: 2022-06-15 | Disposition: A | Discharge status: Home / Self Care | Payer: Medicare Other | Source: Ambulatory Visit | Attending: Radiation Oncology | Admitting: Radiation Oncology

## 2022-06-15 ENCOUNTER — Encounter: Payer: Self-pay | Admitting: Radiation Oncology

## 2022-06-15 ENCOUNTER — Ambulatory Visit: Payer: Medicare Other | Admitting: Radiation Oncology

## 2022-06-15 ENCOUNTER — Other Ambulatory Visit: Payer: Self-pay

## 2022-06-15 ENCOUNTER — Inpatient Hospital Stay: Payer: Medicare Other

## 2022-06-15 VITALS — BP 151/66 | HR 86 | Temp 97.8°F | Resp 20 | Ht 71.0 in | Wt 132.2 lb

## 2022-06-15 DIAGNOSIS — Z86718 Personal history of other venous thrombosis and embolism: Secondary | ICD-10-CM | POA: Diagnosis not present

## 2022-06-15 DIAGNOSIS — C3411 Malignant neoplasm of upper lobe, right bronchus or lung: Secondary | ICD-10-CM | POA: Insufficient documentation

## 2022-06-15 DIAGNOSIS — E7800 Pure hypercholesterolemia, unspecified: Secondary | ICD-10-CM | POA: Diagnosis not present

## 2022-06-15 DIAGNOSIS — N281 Cyst of kidney, acquired: Secondary | ICD-10-CM | POA: Diagnosis not present

## 2022-06-15 DIAGNOSIS — Z8673 Personal history of transient ischemic attack (TIA), and cerebral infarction without residual deficits: Secondary | ICD-10-CM | POA: Diagnosis not present

## 2022-06-15 DIAGNOSIS — E119 Type 2 diabetes mellitus without complications: Secondary | ICD-10-CM | POA: Insufficient documentation

## 2022-06-15 DIAGNOSIS — B182 Chronic viral hepatitis C: Secondary | ICD-10-CM | POA: Insufficient documentation

## 2022-06-15 DIAGNOSIS — Z510 Encounter for antineoplastic radiation therapy: Secondary | ICD-10-CM | POA: Diagnosis not present

## 2022-06-15 DIAGNOSIS — Z7984 Long term (current) use of oral hypoglycemic drugs: Secondary | ICD-10-CM | POA: Diagnosis not present

## 2022-06-15 DIAGNOSIS — Z87891 Personal history of nicotine dependence: Secondary | ICD-10-CM | POA: Diagnosis not present

## 2022-06-15 DIAGNOSIS — J432 Centrilobular emphysema: Secondary | ICD-10-CM | POA: Diagnosis not present

## 2022-06-15 DIAGNOSIS — I10 Essential (primary) hypertension: Secondary | ICD-10-CM | POA: Diagnosis not present

## 2022-06-15 DIAGNOSIS — I080 Rheumatic disorders of both mitral and aortic valves: Secondary | ICD-10-CM | POA: Diagnosis not present

## 2022-06-15 DIAGNOSIS — C3491 Malignant neoplasm of unspecified part of right bronchus or lung: Secondary | ICD-10-CM

## 2022-06-15 DIAGNOSIS — I6782 Cerebral ischemia: Secondary | ICD-10-CM | POA: Diagnosis not present

## 2022-06-15 DIAGNOSIS — R011 Cardiac murmur, unspecified: Secondary | ICD-10-CM | POA: Insufficient documentation

## 2022-06-15 DIAGNOSIS — D509 Iron deficiency anemia, unspecified: Secondary | ICD-10-CM | POA: Diagnosis not present

## 2022-06-15 DIAGNOSIS — Z7982 Long term (current) use of aspirin: Secondary | ICD-10-CM | POA: Diagnosis not present

## 2022-06-15 DIAGNOSIS — Z8505 Personal history of malignant neoplasm of liver: Secondary | ICD-10-CM | POA: Insufficient documentation

## 2022-06-16 ENCOUNTER — Inpatient Hospital Stay: Payer: Medicare Other

## 2022-06-17 ENCOUNTER — Other Ambulatory Visit: Payer: Self-pay | Admitting: Physician Assistant

## 2022-06-17 ENCOUNTER — Inpatient Hospital Stay: Payer: Medicare Other

## 2022-06-17 VITALS — BP 128/54 | HR 78 | Temp 97.7°F | Resp 16

## 2022-06-17 DIAGNOSIS — I10 Essential (primary) hypertension: Secondary | ICD-10-CM | POA: Diagnosis not present

## 2022-06-17 DIAGNOSIS — Z87891 Personal history of nicotine dependence: Secondary | ICD-10-CM | POA: Diagnosis not present

## 2022-06-17 DIAGNOSIS — C3491 Malignant neoplasm of unspecified part of right bronchus or lung: Secondary | ICD-10-CM

## 2022-06-17 DIAGNOSIS — D509 Iron deficiency anemia, unspecified: Secondary | ICD-10-CM | POA: Diagnosis not present

## 2022-06-17 DIAGNOSIS — C3411 Malignant neoplasm of upper lobe, right bronchus or lung: Secondary | ICD-10-CM | POA: Diagnosis not present

## 2022-06-17 DIAGNOSIS — E119 Type 2 diabetes mellitus without complications: Secondary | ICD-10-CM | POA: Diagnosis not present

## 2022-06-17 DIAGNOSIS — Z510 Encounter for antineoplastic radiation therapy: Secondary | ICD-10-CM | POA: Diagnosis not present

## 2022-06-17 MED ORDER — SODIUM CHLORIDE 0.9 % IV SOLN: Freq: Once | INTRAVENOUS | Status: AC

## 2022-06-17 MED ORDER — LORATADINE 10 MG PO TABS
10.0000 mg | ORAL_TABLET | Freq: Once | ORAL | Status: AC
  Administered 2022-06-17: 10 mg via ORAL
  Filled 2022-06-17: qty 1

## 2022-06-17 MED ORDER — SODIUM CHLORIDE 0.9 % IV SOLN
300.0000 mg | Freq: Once | INTRAVENOUS | Status: AC
  Administered 2022-06-17: 300 mg via INTRAVENOUS
  Filled 2022-06-17: qty 300

## 2022-06-17 MED ORDER — ACETAMINOPHEN 325 MG PO TABS
650.0000 mg | ORAL_TABLET | Freq: Once | ORAL | Status: AC
  Administered 2022-06-17: 650 mg via ORAL
  Filled 2022-06-17: qty 2

## 2022-06-17 MED ORDER — LIDOCAINE-PRILOCAINE 2.5-2.5 % EX CREA: 1.0000 | TOPICAL_CREAM | CUTANEOUS | 2 refills | Status: DC | PRN

## 2022-06-17 MED FILL — Dexamethasone Sodium Phosphate Inj 100 MG/10ML: INTRAMUSCULAR | Qty: 1 | Status: AC

## 2022-06-17 NOTE — Patient Instructions (Signed)

## 2022-06-18 ENCOUNTER — Other Ambulatory Visit: Payer: Self-pay | Admitting: Internal Medicine

## 2022-06-18 DIAGNOSIS — C3490 Malignant neoplasm of unspecified part of unspecified bronchus or lung: Secondary | ICD-10-CM

## 2022-06-20 ENCOUNTER — Ambulatory Visit: Payer: Medicare Other | Admitting: Physician Assistant

## 2022-06-20 ENCOUNTER — Inpatient Hospital Stay: Payer: Medicare Other

## 2022-06-20 ENCOUNTER — Ambulatory Visit (HOSPITAL_BASED_OUTPATIENT_CLINIC_OR_DEPARTMENT_OTHER): Payer: Medicare Other | Admitting: Physician Assistant

## 2022-06-20 ENCOUNTER — Other Ambulatory Visit: Payer: Medicare Other

## 2022-06-20 ENCOUNTER — Other Ambulatory Visit: Payer: Self-pay

## 2022-06-20 ENCOUNTER — Ambulatory Visit: Payer: Medicare Other

## 2022-06-20 VITALS — BP 157/80 | HR 94 | Temp 98.3°F | Resp 17 | Wt 133.8 lb

## 2022-06-20 VITALS — BP 157/80 | HR 53 | Temp 98.3°F | Resp 17

## 2022-06-20 DIAGNOSIS — D509 Iron deficiency anemia, unspecified: Secondary | ICD-10-CM | POA: Diagnosis not present

## 2022-06-20 DIAGNOSIS — Z510 Encounter for antineoplastic radiation therapy: Secondary | ICD-10-CM | POA: Diagnosis not present

## 2022-06-20 DIAGNOSIS — C3491 Malignant neoplasm of unspecified part of right bronchus or lung: Secondary | ICD-10-CM | POA: Diagnosis not present

## 2022-06-20 DIAGNOSIS — R002 Palpitations: Secondary | ICD-10-CM

## 2022-06-20 DIAGNOSIS — C3411 Malignant neoplasm of upper lobe, right bronchus or lung: Secondary | ICD-10-CM | POA: Diagnosis not present

## 2022-06-20 DIAGNOSIS — Z87891 Personal history of nicotine dependence: Secondary | ICD-10-CM | POA: Diagnosis not present

## 2022-06-20 DIAGNOSIS — I10 Essential (primary) hypertension: Secondary | ICD-10-CM | POA: Diagnosis not present

## 2022-06-20 DIAGNOSIS — E119 Type 2 diabetes mellitus without complications: Secondary | ICD-10-CM | POA: Diagnosis not present

## 2022-06-20 LAB — CBC WITH DIFFERENTIAL (CANCER CENTER ONLY)
Abs Immature Granulocytes: 0.04 10*3/uL (ref 0.00–0.07)
Basophils Absolute: 0.1 10*3/uL (ref 0.0–0.1)
Basophils Relative: 1 %
Eosinophils Absolute: 0.2 10*3/uL (ref 0.0–0.5)
Eosinophils Relative: 2 %
HCT: 28 % — ABNORMAL LOW (ref 39.0–52.0)
Hemoglobin: 9.1 g/dL — ABNORMAL LOW (ref 13.0–17.0)
Immature Granulocytes: 0 %
Lymphocytes Relative: 11 %
Lymphs Abs: 1.2 10*3/uL (ref 0.7–4.0)
MCH: 25.1 pg — ABNORMAL LOW (ref 26.0–34.0)
MCHC: 32.5 g/dL (ref 30.0–36.0)
MCV: 77.1 fL — ABNORMAL LOW (ref 80.0–100.0)
Monocytes Absolute: 1.1 10*3/uL — ABNORMAL HIGH (ref 0.1–1.0)
Monocytes Relative: 10 %
Neutro Abs: 8.5 10*3/uL — ABNORMAL HIGH (ref 1.7–7.7)
Neutrophils Relative %: 76 %
Platelet Count: 326 10*3/uL (ref 150–400)
RBC: 3.63 MIL/uL — ABNORMAL LOW (ref 4.22–5.81)
RDW: 18 % — ABNORMAL HIGH (ref 11.5–15.5)
WBC Count: 11 10*3/uL — ABNORMAL HIGH (ref 4.0–10.5)
nRBC: 0 % (ref 0.0–0.2)

## 2022-06-20 LAB — CMP (CANCER CENTER ONLY)
ALT: 9 U/L (ref 0–44)
AST: 16 U/L (ref 15–41)
Albumin: 3.4 g/dL — ABNORMAL LOW (ref 3.5–5.0)
Alkaline Phosphatase: 61 U/L (ref 38–126)
Anion gap: 6 (ref 5–15)
BUN: 14 mg/dL (ref 8–23)
CO2: 28 mmol/L (ref 22–32)
Calcium: 10.5 mg/dL — ABNORMAL HIGH (ref 8.9–10.3)
Chloride: 100 mmol/L (ref 98–111)
Creatinine: 0.82 mg/dL (ref 0.61–1.24)
GFR, Estimated: >60 mL/min (ref >60)
Glucose, Bld: 185 mg/dL — ABNORMAL HIGH (ref 70–99)
Potassium: 4.6 mmol/L (ref 3.5–5.1)
Sodium: 134 mmol/L — ABNORMAL LOW (ref 135–145)
Total Bilirubin: 0.3 mg/dL (ref 0.3–1.2)
Total Protein: 8.2 g/dL — ABNORMAL HIGH (ref 6.5–8.1)

## 2022-06-20 MED ORDER — FAMOTIDINE IN NACL 20-0.9 MG/50ML-% IV SOLN
20.0000 mg | Freq: Once | INTRAVENOUS | Status: AC
  Administered 2022-06-20: 20 mg via INTRAVENOUS
  Filled 2022-06-20: qty 50

## 2022-06-20 MED ORDER — PALONOSETRON HCL INJECTION 0.25 MG/5ML
0.2500 mg | Freq: Once | INTRAVENOUS | Status: AC
  Administered 2022-06-20: 0.25 mg via INTRAVENOUS
  Filled 2022-06-20: qty 5

## 2022-06-20 MED ORDER — SODIUM CHLORIDE 0.9 % IV SOLN: Freq: Once | INTRAVENOUS | Status: AC

## 2022-06-20 MED ORDER — SODIUM CHLORIDE 0.9 % IV SOLN
153.0000 mg | Freq: Once | INTRAVENOUS | Status: AC
  Administered 2022-06-20: 150 mg via INTRAVENOUS
  Filled 2022-06-20: qty 15

## 2022-06-20 MED ORDER — DIPHENHYDRAMINE HCL 50 MG/ML IJ SOLN
50.0000 mg | Freq: Once | INTRAMUSCULAR | Status: AC
  Administered 2022-06-20: 50 mg via INTRAVENOUS
  Filled 2022-06-20: qty 1

## 2022-06-20 MED ORDER — SODIUM CHLORIDE 0.9 % IV SOLN
10.0000 mg | Freq: Once | INTRAVENOUS | Status: AC
  Administered 2022-06-20: 10 mg via INTRAVENOUS
  Filled 2022-06-20: qty 10

## 2022-06-20 MED ORDER — SODIUM CHLORIDE 0.9 % IV SOLN
45.0000 mg/m2 | Freq: Once | INTRAVENOUS | Status: AC
  Administered 2022-06-20: 78 mg via INTRAVENOUS
  Filled 2022-06-20: qty 13

## 2022-06-20 NOTE — Progress Notes (Unsigned)
Symptom Management Consult note [place]    Patient Care Team: Lujean Amel, MD as PCP - General (Family Medicine) Jerline Pain, MD as PCP - Cardiology (Cardiology)    Name / MRN / DOB: [name]  [id number]  [birth year]   Date of visit: 06/20/2022   Chief Complaint/Reason for visit: palpitations   Current Therapy: carboplatin and paclitaxel with concurrent radiation  Last treatment:  Day 1   Cycle 1 today   ASSESSMENT & PLAN: Patient is a 82 y.o. male  with oncologic history of stage III (T3, N2, M0) non-small cell lung cancer, squamous cell carcinoma  followed by Dr. Julien Nordmann.  I have viewed most recent oncology note and lab work.    #  Stage III (T3, N2, M0) non-small cell lung cancer, squamous cell carcinoma  - First chemo today.  - Next appointment with oncologist is 06/27/22   #Palpitations -After finishing chemotherapy patient was having vital signs checked and heart rate was varying from 50s-100.  He admitted to feeling palpitations.  Radial pulse with regular rhythm and PVCs appreciated. -EKG obtained and shows sinus rhythm with PVC also LVH.  I viewed previous EKGs which show sinus rhythm with LVH. -Patient then noted that palpitations resolved  and is asymptomatic.  Radial pulse in the 80s and regular. -Chart review shows patient is established with cardiology, patient of Dr. Marlou Porch. Last saw in 06/02/21 and plan was to follow up in 1 year. Patient is scheduled to see cards 11/14/22 -I discussed case with oncologist Dr. Earlie Server who agrees with plan to discharge home with close cardiac follow-up. -I discussed plan with patient and significant other who are in agreement.  Discussed ED precautions should any symptoms develop or worsen at length.   Heme/Onc History: Oncology History  Squamous cell carcinoma of lung ([place])  06/08/2022 Initial Diagnosis   Squamous cell carcinoma of lung ([place])   06/20/2022 -  Chemotherapy   Patient is on  Treatment Plan : LUNG Carboplatin + Paclitaxel + XRT q7d         Interval history-: [name] is a 82 y.o. male with oncologic history as above seen in the infusion center today with chief complaint of palpitations. Patient is accompanied by significant other who provides additional history.  Patient states he tolerated his chemotherapy well today he thinks.  He felt fine during the infusion.  While he was getting his vitals checked after infusion had completed he was noted to have ranging heart rate in the 50s to 100.  Patient endorsed feeling palpitations at that time.  He thinks this is new for him, cannot remember if he has had them in the past.  He denies any chest pain or shortness of breath, nausea or diaphoresis. After completing physical exam patient reports the palpitations have resolved. His vitals were rechecked and HR is in the 80s. No medications were given after chemotherapy. Patient denies any recent illness. He last saw cardiology in 2023 and has annual exams.    ROS  All other systems are reviewed and are negative for acute change except as noted in the HPI.    Allergies  Allergen Reactions   Azithromycin Swelling    Angioedema 05/2022    Lisinopril     Angioedema 05/2022   Ezetimibe-Simvastatin     Leg pains Restlessness at night     Metoprolol     bradycardia   Rosuvastatin     Discomfort, RESTLESS, CAN'T SLEEP  Past Medical History:  Diagnosis Date   Acute epididymo-orchitis    Aortic stenosis    s/p bioprosthetic AVR (Dr. Cyndia Bent) 11/2013   Arthritis    Diabetes mellitus without complication [facility])    ED (erectile dysfunction)    Essential hypertension, benign    Heart murmur    Hep C w/o coma, chronic ([place])    History of blood transfusion    History of DVT of lower extremity    Hx of echocardiogram    Echo (8/15):  Severe LVH, EF 60-65%, no RWMA, Gr 3 DD, AVR ok (mean 21 mmHg), MAC, mild LAE, mild to mod RAE   Hypercholesterolemia    PONV  (postoperative nausea and vomiting)      Past Surgical History:  Procedure Laterality Date   AORTIC VALVE REPLACEMENT N/A 11/19/2013   Procedure: AORTIC VALVE REPLACEMENT (AVR);  Surgeon: Gaye Pollack, MD;  Location: [place];  Service: Open Heart Surgery;  Laterality: N/A;   BUNIONECTOMY Bilateral 05/09/2006   CARDIAC CATHETERIZATION  10/01/2013   COLONOSCOPY  01/07/2010   internal hemorrhoids.  Dr Deatra Ina   ESOPHAGOGASTRODUODENOSCOPY N/A 04/24/2013   Procedure: ESOPHAGOGASTRODUODENOSCOPY (EGD);  Surgeon: Jerene Bears, MD;  Location: [facility] ENDOSCOPY;  Service: Endoscopy;  Laterality: N/A;   INGUINAL HERNIA REPAIR Right    INTRAOPERATIVE TRANSESOPHAGEAL ECHOCARDIOGRAM N/A 11/19/2013   Procedure: INTRAOPERATIVE TRANSESOPHAGEAL ECHOCARDIOGRAM;  Surgeon: Gaye Pollack, MD;  Location: [place] OR;  Service: Open Heart Surgery;  Laterality: N/A;   KNEE ARTHROSCOPY Left 05/09/2009   Dr Collier Salina   LAMINECTOMY  06/09/2005   Dr Joya Salm. multiple lumbar level laminectomies.    LEFT AND RIGHT HEART CATHETERIZATION WITH CORONARY ANGIOGRAM N/A 10/01/2013   Procedure: LEFT AND RIGHT HEART CATHETERIZATION WITH CORONARY ANGIOGRAM;  Surgeon: Candee Furbish, MD;  Location[facility] CATH LAB;  Service: Cardiovascular;  Laterality: N/A;   SPINAL FUSION     lumbar   TEE WITHOUT CARDIOVERSION  11/07/2003   Aoritic valve sclerosis.    TONSILLECTOMY     VIDEO BRONCHOSCOPY WITH ENDOBRONCHIAL ULTRASOUND N/A 06/02/2022   Procedure: VIDEO BRONCHOSCOPY WITH ENDOBRONCHIAL ULTRASOUND;  Surgeon: Margaretha Seeds, MD;  Location: [place];  Service: Thoracic;  Laterality: N/A;    Social History   Socioeconomic History   Marital status: Married    Spouse name: Not on file   Number of children: Not on file   Years of education: Not on file   Highest education level: Not on file  Occupational History   Not on file  Tobacco Use   Smoking status: Former    Packs/day: 1.50    Years: 31.00    Total pack years: 46.50    Types:  Cigarettes    Quit date: 85    Years since quitting: 31.1   Smokeless tobacco: Never  Vaping Use   Vaping Use: Never used  Substance and Sexual Activity   Alcohol use: No   Drug use: No   Sexual activity: Not on file  Other Topics Concern   Not on file  Social History Narrative   Not on file   Social Determinants of Health   Financial Resource Strain: Not on file  Food Insecurity: No Food Insecurity (06/04/2022)   Hunger Vital Sign    Worried About Running Out of Food in the Last Year: Never true    Ran Out of Food in the Last Year: Never true  Transportation Needs: No Transportation Needs (06/04/2022)   PRAPARE - Transportation    Lack of Transportation (  Medical): No    Lack of Transportation (Non-Medical): No  Physical Activity: Not on file  Stress: Not on file  Social Connections: Not on file  Intimate Partner Violence: Not At Risk (06/04/2022)   Humiliation, Afraid, Rape, and Kick questionnaire    Fear of Current or Ex-Partner: No    Emotionally Abused: No    Physically Abused: No    Sexually Abused: No    Family History  Problem Relation Age of Onset   Diabetes Father    Stroke Father    Diabetes Mother    Stroke Mother    Hypertension Unknown        family history   Heart attack Neg Hx      Current Outpatient Medications:    acetaminophen (TYLENOL) 500 MG tablet, Take 500 mg by mouth every 6 (six) hours as needed for mild pain or moderate pain., Disp: , Rfl:    amLODipine (NORVASC) 10 MG tablet, Take 1 tablet (10 mg total) by mouth daily., Disp: 30 tablet, Rfl: 0   aspirin EC 81 MG EC tablet, Take 1 tablet (81 mg total) by mouth daily., Disp: , Rfl:    atorvastatin (LIPITOR) 20 MG tablet, Take 20 mg by mouth daily., Disp: , Rfl:    dextromethorphan-guaiFENesin (MUCINEX DM) 30-600 MG 12hr tablet, Take 1 tablet by mouth 2 (two) times daily as needed for cough., Disp: , Rfl:    glucose blood test strip, , Disp: , Rfl:    glucose blood test strip, as directed  DX 250, Disp: , Rfl:    hydrochlorothiazide (HYDRODIURIL) 12.5 MG tablet, Take 1 tablet (12.5 mg total) by mouth daily., Disp: 30 tablet, Rfl: 0   lidocaine-prilocaine (EMLA) cream, Apply 1 Application topically as needed., Disp: 30 g, Rfl: 2   metFORMIN (GLUCOPHAGE) 1000 MG tablet, Take 1,000 mg by mouth 2 (two) times daily with a meal., Disp: , Rfl:    Multiple Vitamin (MULTIVITAMIN WITH MINERALS) TABS tablet, Take 1 tablet by mouth daily., Disp: , Rfl:    prochlorperazine (COMPAZINE) 10 MG tablet, Take 1 tablet (10 mg total) by mouth every 6 (six) hours as needed., Disp: 30 tablet, Rfl: 2   SF 5000 PLUS 1.1 % CREA dental cream, Place 1 Application onto teeth daily., Disp: , Rfl: 2   tamsulosin (FLOMAX) 0.4 MG CAPS capsule, Take 0.4 mg by mouth daily., Disp: , Rfl:   PHYSICAL EXAM: ECOG FS:1 - Symptomatic but completely ambulatory    Vitals:   06/20/22 1526  BP: (!) 157/80  Pulse: (!) 53  Resp: 17  Temp: 98.3 F (36.8 C)  SpO2: 99%   Physical Exam Vitals and nursing note reviewed.  Constitutional:      Appearance: He is well-developed. He is not ill-appearing or toxic-appearing.  HENT:     Head: Normocephalic.     Nose: Nose normal.  Eyes:     Conjunctiva/sclera: Conjunctivae normal.  Neck:     Vascular: No JVD.  Cardiovascular:     Rate and Rhythm: Normal rate and regular rhythm.     Pulses: Normal pulses.          Radial pulses are 2+ on the right side and 2+ on the left side.     Heart sounds: Murmur heard.     Comments: PVCs appreciated Pulmonary:     Effort: Pulmonary effort is normal.     Breath sounds: Normal breath sounds.  Abdominal:     General: There is no distension.  Musculoskeletal:  Cervical back: Normal range of motion.     Right lower leg: No edema.     Left lower leg: No edema.  Skin:    General: Skin is warm and dry.  Neurological:     Mental Status: He is oriented to person, place, and time.        LABORATORY DATA: I have reviewed the  data as listed    Latest Ref Rng & Units 06/20/2022   10:12 AM 06/09/2022   10:02 AM 06/03/2022    2:43 AM  CBC  WBC 4.0 - 10.5 K/uL 11.0  11.8  8.5   Hemoglobin 13.0 - 17.0 g/dL 9.1  9.8  8.7   Hematocrit 39.0 - 52.0 % 28.0  30.4  27.3   Platelets 150 - 400 K/uL 326  364  333         Latest Ref Rng & Units 06/20/2022   10:12 AM 06/09/2022   10:02 AM 06/06/2022    5:04 AM  CMP  Glucose 70 - 99 mg/dL 185  158  109   BUN 8 - 23 mg/dL 14  20  5    Creatinine 0.61 - 1.24 mg/dL 0.82  0.84  0.82   Sodium 135 - 145 mmol/L 134  136  137   Potassium 3.5 - 5.1 mmol/L 4.6  4.8  3.0   Chloride 98 - 111 mmol/L 100  102  97   CO2 22 - 32 mmol/L 28  27  30    Calcium 8.9 - 10.3 mg/dL 10.5  10.8  8.7   Total Protein 6.5 - 8.1 g/dL 8.2  8.4    Total Bilirubin 0.3 - 1.2 mg/dL 0.3  0.4    Alkaline Phos 38 - 126 U/L 61  57    AST 15 - 41 U/L 16  12    ALT 0 - 44 U/L 9  8         RADIOGRAPHIC STUDIES (from last 24 hours if applicable) I have personally reviewed the radiological images as listed and agreed with the findings in the report. No results found.      Visit Diagnosis: 1. Squamous cell carcinoma of right lung (HCC)   2. Palpitations      No orders of the defined types were placed in this encounter.   All questions were answered. The patient knows to call the clinic with any problems, questions or concerns. No barriers to learning was detected.  I have spent a total of 30 minutes minutes of face-to-face and non-face-to-face time, preparing to see the patient, obtaining and/or reviewing separately obtained history, performing a medically appropriate examination, counseling and educating the patient, ordering tests, documenting clinical information in the electronic health record, and care coordination (communications with other health care professionals or caregivers).    Thank you for allowing me to participate in the care of this patient.    Barrie Folk,  PA-C Department of Hematology/Oncology [facility] at [facility] Phone: [phone]  [phone]    06/21/2022 10:32 AM

## 2022-06-20 NOTE — Patient Instructions (Signed)
[facility]  Discharge Instructions: Thank you for choosing [facility] to provide your oncology and hematology care.   If you have a lab appointment with the [facility], please go directly to the [facility] and check in at the registration area.   Wear comfortable clothing and clothing appropriate for easy access to any Portacath or PICC line.   We strive to give you quality time with your provider. You may need to reschedule your appointment if you arrive late (15 or more minutes).  Arriving late affects you and other patients whose appointments are after yours.  Also, if you miss three or more appointments without notifying the office, you may be dismissed from the clinic at the provider's discretion.      For prescription refill requests, have your pharmacy contact our office and allow 72 hours for refills to be completed.    Today you received the following chemotherapy and/or immunotherapy agents: Paclitaxel and Carboplatin      To help prevent nausea and vomiting after your treatment, we encourage you to take your nausea medication as directed.  BELOW ARE SYMPTOMS THAT SHOULD BE REPORTED IMMEDIATELY: *FEVER GREATER THAN 100.4 F (38 C) OR HIGHER *CHILLS OR SWEATING *NAUSEA AND VOMITING THAT IS NOT CONTROLLED WITH YOUR NAUSEA MEDICATION *UNUSUAL SHORTNESS OF BREATH *UNUSUAL BRUISING OR BLEEDING *URINARY PROBLEMS (pain or burning when urinating, or frequent urination) *BOWEL PROBLEMS (unusual diarrhea, constipation, pain near the anus) TENDERNESS IN MOUTH AND THROAT WITH OR WITHOUT PRESENCE OF ULCERS (sore throat, sores in mouth, or a toothache) UNUSUAL RASH, SWELLING OR PAIN  UNUSUAL VAGINAL DISCHARGE OR ITCHING   Items with * indicate a potential emergency and should be followed up as soon as possible or go to the Emergency Department if any problems should occur.  Please show the CHEMOTHERAPY ALERT CARD or IMMUNOTHERAPY  ALERT CARD at check-in to the Emergency Department and triage nurse.  Should you have questions after your visit or need to cancel or reschedule your appointment, please contact [facility]  Dept: [phone]  and follow the prompts.  Office hours are 8:00 a.m. to 4:30 p.m. Monday - Friday. Please note that voicemails left after 4:00 p.m. may not be returned until the following business day.  We are closed weekends and major holidays. You have access to a nurse at all times for urgent questions. Please call the main number to the clinic Dept: [phone] and follow the prompts.   For any non-urgent questions, you may also contact your provider using MyChart. We now offer e-Visits for anyone 85 and older to request care online for non-urgent symptoms. For details visit [email or web address].   Also download the MyChart app! Go to the app store, search "MyChart", open the app, select [facility], and log in with your MyChart username and password.  Paclitaxel Injection What is this medication? PACLITAXEL (PAK li TAX el) treats some types of cancer. It works by slowing down the growth of cancer cells. This medicine may be used for other purposes; ask your health care provider or pharmacist if you have questions. COMMON BRAND NAME(S): Onxol, Taxol What should I tell my care team before I take this medication? They need to know if you have any of these conditions: Heart disease Liver disease Low white blood cell levels An unusual or allergic reaction to paclitaxel, other medications, foods, dyes, or preservatives If you or your partner are pregnant or trying  to get pregnant Breast-feeding How should I use this medication? This medication is injected into a vein. It is given by your care team in a hospital or clinic setting. Talk to your care team about the use of this medication in children. While it may be given to children for selected conditions,  precautions do apply. Overdosage: If you think you have taken too much of this medicine contact a poison control center or emergency room at once. NOTE: This medicine is only for you. Do not share this medicine with others. What if I miss a dose? Keep appointments for follow-up doses. It is important not to miss your dose. Call your care team if you are unable to keep an appointment. What may interact with this medication? Do not take this medication with any of the following: Live virus vaccines Other medications may affect the way this medication works. Talk with your care team about all of the medications you take. They may suggest changes to your treatment plan to lower the risk of side effects and to make sure your medications work as intended. This list may not describe all possible interactions. Give your health care provider a list of all the medicines, herbs, non-prescription drugs, or dietary supplements you use. Also tell them if you smoke, drink alcohol, or use illegal drugs. Some items may interact with your medicine. What should I watch for while using this medication? Your condition will be monitored carefully while you are receiving this medication. You may need blood work while taking this medication. This medication may make you feel generally unwell. This is not uncommon as chemotherapy can affect healthy cells as well as cancer cells. Report any side effects. Continue your course of treatment even though you feel ill unless your care team tells you to stop. This medication can cause serious allergic reactions. To reduce the risk, your care team may give you other medications to take before receiving this one. Be sure to follow the directions from your care team. This medication may increase your risk of getting an infection. Call your care team for advice if you get a fever, chills, sore throat, or other symptoms of a cold or flu. Do not treat yourself. Try to avoid being around  people who are sick. This medication may increase your risk to bruise or bleed. Call your care team if you notice any unusual bleeding. Be careful brushing or flossing your teeth or using a toothpick because you may get an infection or bleed more easily. If you have any dental work done, tell your dentist you are receiving this medication. Talk to your care team if you may be pregnant. Serious birth defects can occur if you take this medication during pregnancy. Talk to your care team before breastfeeding. Changes to your treatment plan may be needed. What side effects may I notice from receiving this medication? Side effects that you should report to your care team as soon as possible: Allergic reactions--skin rash, itching, hives, swelling of the face, lips, tongue, or throat Heart rhythm changes--fast or irregular heartbeat, dizziness, feeling faint or lightheaded, chest pain, trouble breathing Increase in blood pressure Infection--fever, chills, cough, sore throat, wounds that don't heal, pain or trouble when passing urine, general feeling of discomfort or being unwell Low blood pressure--dizziness, feeling faint or lightheaded, blurry vision Low red blood cell level--unusual weakness or fatigue, dizziness, headache, trouble breathing Painful swelling, warmth, or redness of the skin, blisters or sores at the infusion site Pain, tingling, or  numbness in the hands or feet Slow heartbeat--dizziness, feeling faint or lightheaded, confusion, trouble breathing, unusual weakness or fatigue Unusual bruising or bleeding Side effects that usually do not require medical attention (report to your care team if they continue or are bothersome): Diarrhea Hair loss Joint pain Loss of appetite Muscle pain Nausea Vomiting This list may not describe all possible side effects. Call your doctor for medical advice about side effects. You may report side effects to FDA at 1-800-FDA-1088. Where should I keep  my medication? This medication is given in a hospital or clinic. It will not be stored at home. NOTE: This sheet is a summary. It may not cover all possible information. If you have questions about this medicine, talk to your doctor, pharmacist, or health care provider.  2023 Elsevier/Gold Standard (2021-08-25 00:00:00)  Carboplatin Injection What is this medication? CARBOPLATIN (KAR boe pla tin) treats some types of cancer. It works by slowing down the growth of cancer cells. This medicine may be used for other purposes; ask your health care provider or pharmacist if you have questions. COMMON BRAND NAME(S): Paraplatin What should I tell my care team before I take this medication? They need to know if you have any of these conditions: Blood disorders Hearing problems Kidney disease Recent or ongoing radiation therapy An unusual or allergic reaction to carboplatin, cisplatin, other medications, foods, dyes, or preservatives Pregnant or trying to get pregnant Breast-feeding How should I use this medication? This medication is injected into a vein. It is given by your care team in a hospital or clinic setting. Talk to your care team about the use of this medication in children. Special care may be needed. Overdosage: If you think you have taken too much of this medicine contact a poison control center or emergency room at once. NOTE: This medicine is only for you. Do not share this medicine with others. What if I miss a dose? Keep appointments for follow-up doses. It is important not to miss your dose. Call your care team if you are unable to keep an appointment. What may interact with this medication? Medications for seizures Some antibiotics, such as amikacin, gentamicin, neomycin, streptomycin, tobramycin Vaccines This list may not describe all possible interactions. Give your health care provider a list of all the medicines, herbs, non-prescription drugs, or dietary supplements you  use. Also tell them if you smoke, drink alcohol, or use illegal drugs. Some items may interact with your medicine. What should I watch for while using this medication? Your condition will be monitored carefully while you are receiving this medication. You may need blood work while taking this medication. This medication may make you feel generally unwell. This is not uncommon, as chemotherapy can affect healthy cells as well as cancer cells. Report any side effects. Continue your course of treatment even though you feel ill unless your care team tells you to stop. In some cases, you may be given additional medications to help with side effects. Follow all directions for their use. This medication may increase your risk of getting an infection. Call your care team for advice if you get a fever, chills, sore throat, or other symptoms of a cold or flu. Do not treat yourself. Try to avoid being around people who are sick. Avoid taking medications that contain aspirin, acetaminophen, ibuprofen, naproxen, or ketoprofen unless instructed by your care team. These medications may hide a fever. Be careful brushing or flossing your teeth or using a toothpick because you may get  an infection or bleed more easily. If you have any dental work done, tell your dentist you are receiving this medication. Talk to your care team if you wish to become pregnant or think you might be pregnant. This medication can cause serious birth defects. Talk to your care team about effective forms of contraception. Do not breast-feed while taking this medication. What side effects may I notice from receiving this medication? Side effects that you should report to your care team as soon as possible: Allergic reactions--skin rash, itching, hives, swelling of the face, lips, tongue, or throat Infection--fever, chills, cough, sore throat, wounds that don't heal, pain or trouble when passing urine, general feeling of discomfort or being  unwell Low red blood cell level--unusual weakness or fatigue, dizziness, headache, trouble breathing Pain, tingling, or numbness in the hands or feet, muscle weakness, change in vision, confusion or trouble speaking, loss of balance or coordination, trouble walking, seizures Unusual bruising or bleeding Side effects that usually do not require medical attention (report to your care team if they continue or are bothersome): Hair loss Nausea Unusual weakness or fatigue Vomiting This list may not describe all possible side effects. Call your doctor for medical advice about side effects. You may report side effects to FDA at 1-800-FDA-1088. Where should I keep my medication? This medication is given in a hospital or clinic. It will not be stored at home. NOTE: This sheet is a summary. It may not cover all possible information. If you have questions about this medicine, talk to your doctor, pharmacist, or health care provider.  2023 Elsevier/Gold Standard (2021-08-09 00:00:00)

## 2022-06-21 ENCOUNTER — Encounter: Payer: Self-pay | Admitting: Internal Medicine

## 2022-06-21 ENCOUNTER — Encounter: Payer: Self-pay | Admitting: Physician Assistant

## 2022-06-21 DIAGNOSIS — Z510 Encounter for antineoplastic radiation therapy: Secondary | ICD-10-CM | POA: Diagnosis not present

## 2022-06-21 DIAGNOSIS — E119 Type 2 diabetes mellitus without complications: Secondary | ICD-10-CM | POA: Diagnosis not present

## 2022-06-21 DIAGNOSIS — Z87891 Personal history of nicotine dependence: Secondary | ICD-10-CM | POA: Diagnosis not present

## 2022-06-21 DIAGNOSIS — C3411 Malignant neoplasm of upper lobe, right bronchus or lung: Secondary | ICD-10-CM | POA: Diagnosis not present

## 2022-06-21 DIAGNOSIS — D509 Iron deficiency anemia, unspecified: Secondary | ICD-10-CM | POA: Diagnosis not present

## 2022-06-21 DIAGNOSIS — I10 Essential (primary) hypertension: Secondary | ICD-10-CM | POA: Diagnosis not present

## 2022-06-21 NOTE — Telephone Encounter (Signed)
Called pt to see how he did with his recent treatment.  He reports doing well & denies any concerns.  He knows his next appt & how to reach [place] if needed.

## 2022-06-21 NOTE — Telephone Encounter (Signed)
-----   Message from Charleston Poot, RN sent at 06/20/2022  3:10 PM EST ----- Regarding: First time/ Paclitaxel and Carboplatin/ Dr Julien Nordmann pt Hello,  Pt had first time paclitaxel and carboplatin today. Tolerated treatment well.  Thanks!

## 2022-06-22 ENCOUNTER — Other Ambulatory Visit: Payer: Self-pay

## 2022-06-22 ENCOUNTER — Inpatient Hospital Stay: Payer: Medicare Other

## 2022-06-22 ENCOUNTER — Other Ambulatory Visit: Payer: Self-pay | Admitting: Physician Assistant

## 2022-06-22 ENCOUNTER — Ambulatory Visit
Admission: RE | Admit: 2022-06-22 | Discharge: 2022-06-22 | Disposition: A | Discharge status: Home / Self Care | Payer: Medicare Other | Source: Ambulatory Visit | Attending: Radiation Oncology | Admitting: Radiation Oncology

## 2022-06-22 VITALS — BP 141/74 | HR 70 | Temp 100.0°F | Resp 18

## 2022-06-22 DIAGNOSIS — I10 Essential (primary) hypertension: Secondary | ICD-10-CM | POA: Diagnosis not present

## 2022-06-22 DIAGNOSIS — C3491 Malignant neoplasm of unspecified part of right bronchus or lung: Secondary | ICD-10-CM

## 2022-06-22 DIAGNOSIS — Z87891 Personal history of nicotine dependence: Secondary | ICD-10-CM | POA: Diagnosis not present

## 2022-06-22 DIAGNOSIS — E119 Type 2 diabetes mellitus without complications: Secondary | ICD-10-CM | POA: Diagnosis not present

## 2022-06-22 DIAGNOSIS — R059 Cough, unspecified: Secondary | ICD-10-CM

## 2022-06-22 DIAGNOSIS — Z510 Encounter for antineoplastic radiation therapy: Secondary | ICD-10-CM | POA: Diagnosis not present

## 2022-06-22 DIAGNOSIS — D509 Iron deficiency anemia, unspecified: Secondary | ICD-10-CM

## 2022-06-22 DIAGNOSIS — C3411 Malignant neoplasm of upper lobe, right bronchus or lung: Secondary | ICD-10-CM | POA: Diagnosis not present

## 2022-06-22 LAB — RAD ONC ARIA SESSION SUMMARY
Course Elapsed Days: 0
Plan Fractions Treated to Date: 1
Plan Prescribed Dose Per Fraction: 2 Gy
Plan Total Fractions Prescribed: 30
Plan Total Prescribed Dose: 60 Gy
Reference Point Dosage Given to Date: 2 Gy
Reference Point Session Dosage Given: 2 Gy
Session Number: 1

## 2022-06-22 MED ORDER — ACETAMINOPHEN 325 MG PO TABS
650.0000 mg | ORAL_TABLET | Freq: Once | ORAL | Status: AC
  Administered 2022-06-22: 650 mg via ORAL
  Filled 2022-06-22: qty 2

## 2022-06-22 MED ORDER — SODIUM CHLORIDE 0.9 % IV SOLN: Freq: Once | INTRAVENOUS | Status: AC

## 2022-06-22 MED ORDER — BENZONATATE 100 MG PO CAPS: 100.0000 mg | ORAL_CAPSULE | Freq: Three times a day (TID) | ORAL | 2 refills | Status: DC | PRN

## 2022-06-22 MED ORDER — LORATADINE 10 MG PO TABS
10.0000 mg | ORAL_TABLET | Freq: Once | ORAL | Status: AC
  Administered 2022-06-22: 10 mg via ORAL
  Filled 2022-06-22: qty 1

## 2022-06-22 MED ORDER — SODIUM CHLORIDE 0.9 % IV SOLN
300.0000 mg | Freq: Once | INTRAVENOUS | Status: AC
  Administered 2022-06-22: 300 mg via INTRAVENOUS
  Filled 2022-06-22: qty 300

## 2022-06-22 NOTE — Patient Instructions (Signed)

## 2022-06-23 ENCOUNTER — Other Ambulatory Visit: Payer: Self-pay

## 2022-06-23 ENCOUNTER — Ambulatory Visit
Admission: RE | Admit: 2022-06-23 | Discharge: 2022-06-23 | Disposition: A | Discharge status: Home / Self Care | Payer: Medicare Other | Source: Ambulatory Visit | Attending: Radiation Oncology | Admitting: Radiation Oncology

## 2022-06-23 DIAGNOSIS — D509 Iron deficiency anemia, unspecified: Secondary | ICD-10-CM | POA: Diagnosis not present

## 2022-06-23 DIAGNOSIS — E119 Type 2 diabetes mellitus without complications: Secondary | ICD-10-CM | POA: Diagnosis not present

## 2022-06-23 DIAGNOSIS — I10 Essential (primary) hypertension: Secondary | ICD-10-CM | POA: Diagnosis not present

## 2022-06-23 DIAGNOSIS — C3411 Malignant neoplasm of upper lobe, right bronchus or lung: Secondary | ICD-10-CM | POA: Diagnosis not present

## 2022-06-23 DIAGNOSIS — Z510 Encounter for antineoplastic radiation therapy: Secondary | ICD-10-CM | POA: Diagnosis not present

## 2022-06-23 DIAGNOSIS — Z87891 Personal history of nicotine dependence: Secondary | ICD-10-CM | POA: Diagnosis not present

## 2022-06-23 LAB — RAD ONC ARIA SESSION SUMMARY
Course Elapsed Days: 1
Plan Fractions Treated to Date: 2
Plan Prescribed Dose Per Fraction: 2 Gy
Plan Total Fractions Prescribed: 30
Plan Total Prescribed Dose: 60 Gy
Reference Point Dosage Given to Date: 4 Gy
Reference Point Session Dosage Given: 2 Gy
Session Number: 2

## 2022-06-24 ENCOUNTER — Ambulatory Visit
Admission: RE | Admit: 2022-06-24 | Discharge: 2022-06-24 | Disposition: A | Discharge status: Home / Self Care | Payer: Medicare Other | Source: Ambulatory Visit | Attending: Radiation Oncology | Admitting: Radiation Oncology

## 2022-06-24 ENCOUNTER — Ambulatory Visit: Payer: Medicare Other

## 2022-06-24 ENCOUNTER — Other Ambulatory Visit (HOSPITAL_BASED_OUTPATIENT_CLINIC_OR_DEPARTMENT_OTHER): Payer: Self-pay

## 2022-06-24 ENCOUNTER — Other Ambulatory Visit: Payer: Self-pay

## 2022-06-24 DIAGNOSIS — Z510 Encounter for antineoplastic radiation therapy: Secondary | ICD-10-CM | POA: Diagnosis not present

## 2022-06-24 DIAGNOSIS — E119 Type 2 diabetes mellitus without complications: Secondary | ICD-10-CM | POA: Diagnosis not present

## 2022-06-24 DIAGNOSIS — C3411 Malignant neoplasm of upper lobe, right bronchus or lung: Secondary | ICD-10-CM | POA: Diagnosis not present

## 2022-06-24 DIAGNOSIS — R918 Other nonspecific abnormal finding of lung field: Secondary | ICD-10-CM

## 2022-06-24 DIAGNOSIS — Z87891 Personal history of nicotine dependence: Secondary | ICD-10-CM | POA: Diagnosis not present

## 2022-06-24 DIAGNOSIS — I10 Essential (primary) hypertension: Secondary | ICD-10-CM | POA: Diagnosis not present

## 2022-06-24 DIAGNOSIS — D509 Iron deficiency anemia, unspecified: Secondary | ICD-10-CM | POA: Diagnosis not present

## 2022-06-24 LAB — RAD ONC ARIA SESSION SUMMARY
Course Elapsed Days: 2
Plan Fractions Treated to Date: 3
Plan Prescribed Dose Per Fraction: 2 Gy
Plan Total Fractions Prescribed: 30
Plan Total Prescribed Dose: 60 Gy
Reference Point Dosage Given to Date: 6 Gy
Reference Point Session Dosage Given: 2 Gy
Session Number: 3

## 2022-06-24 MED FILL — Dexamethasone Sodium Phosphate Inj 100 MG/10ML: INTRAMUSCULAR | Qty: 1 | Status: AC

## 2022-06-24 NOTE — Progress Notes (Unsigned)
[facility] OFFICE PROGRESS NOTE  [name], [name], MD [place] [address] [address]  DIAGNOSIS: Stage III (T3, N2, M0) non-small cell lung cancer, squamous cell carcinoma.  The patient presented with a large centrally necrotic right upper lobe lung mass abutting the mediastinum, T3 and T4 vertebral bodies and a hypermetabolic precarinal lymph node.    PDL1: 40%   PRIOR THERAPY: None    CURRENT THERAPY: Concurrent chemoradiation with carboplatin for an AUC of 2 and paclitaxel 45 mg/m starting 06/20/2022.  Status post 1 cycle   INTERVAL HISTORY: AZAN MANERI 82 y.o. male returns to the clinic today for a follow-up visit accompanied by his wife.  The patient was recently diagnosed with stage III lung cancer.  He is currently undergoing a course of concurrent chemoradiation and is status post his first week of treatment and he tolerated it well without any concerning adverse side effects except he did not like having an IV. Therefore, he is scheduled for a port-a-cath later this week. From reviewing his schedule, there are some errors. He already received 2 venofer infusions. He will receive his next one tomorrow.  He was seen in the symptom management clinic following his first infusion for palpitations.  The patient is established with cardiology and was recommended to follow-up with them sooner for palpitations his EKG showed PVCs.  The patient denies any fever, night sweats, or unexplained weight loss.  He has baseline cold intolerance secondary to his anemia.  He continues to have a cough, which is what prompted evaluation to his cancer diagnosis in the beginning.  He reports overall his cough is improved compared to prior.  He was given a prescription for Tessalon which does help his cough.  He only reports dyspnea when he coughs. Denies any chest pain.  Denies any hemoptysis.  Denies any nausea, vomiting, or diarrhea.  He had mild constipation with his first  cycle of treatment which resolved with prune juice.  He sometimes has a mild frontal headache but none at this time.   He is here today for evaluation repeat blood work before starting cycle #2 of his treatment.  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  MEDICATIONS:  Current Outpatient Medications  Medication Sig Dispense Refill   acetaminophen (TYLENOL) 500 MG tablet Take 500 mg by mouth every 6 (six) hours as needed for mild pain or moderate pain.     amLODipine (NORVASC) 10 MG tablet Take 1 tablet (10 mg total) by mouth daily. 30 tablet 0   aspirin EC 81 MG EC tablet Take 1 tablet (81 mg total) by mouth daily.     atorvastatin (LIPITOR) 20 MG tablet Take 20 mg by mouth daily.  benzonatate (TESSALON) 100 MG capsule Take 1 capsule (100 mg total) by mouth 3 (three) times daily as needed for cough. 30 capsule 2   dextromethorphan-guaiFENesin (MUCINEX DM) 30-600 MG 12hr tablet Take 1 tablet by mouth 2 (two) times daily as needed for cough.     glucose blood test strip      glucose blood test strip as directed DX 250     hydrochlorothiazide (HYDRODIURIL) 12.5 MG tablet Take 1 tablet (12.5 mg total) by mouth daily. 30 tablet 0   lidocaine-prilocaine (EMLA) cream Apply 1 Application topically as needed. 30 g 2   metFORMIN (GLUCOPHAGE)  1000 MG tablet Take 1,000 mg by mouth 2 (two) times daily with a meal.     Multiple Vitamin (MULTIVITAMIN WITH MINERALS) TABS tablet Take 1 tablet by mouth daily.     prochlorperazine (COMPAZINE) 10 MG tablet Take 1 tablet (10 mg total) by mouth every 6 (six) hours as needed. 30 tablet 2   SF 5000 PLUS 1.1 % CREA dental cream Place 1 Application onto teeth daily.  2   tamsulosin (FLOMAX) 0.4 MG CAPS capsule Take 0.4 mg by mouth daily.     No current facility-administered medications for this visit.    SURGICAL HISTORY:  Past Surgical History:  Procedure Laterality Date   AORTIC VALVE REPLACEMENT N/A 11/19/2013   Procedure: AORTIC VALVE REPLACEMENT (AVR);  Surgeon: Gaye Pollack, MD;  Location: [facility];  Service: Open Heart Surgery;  Laterality: N/A;   BUNIONECTOMY Bilateral 05/09/2006   CARDIAC CATHETERIZATION  10/01/2013   COLONOSCOPY  01/07/2010   internal hemorrhoids.  Dr Deatra Ina   ESOPHAGOGASTRODUODENOSCOPY N/A 04/24/2013   Procedure: ESOPHAGOGASTRODUODENOSCOPY (EGD);  Surgeon: Jerene Bears, MD;  [facility] ENDOSCOPY;  Service: Endoscopy;  Laterality: N/A;   INGUINAL HERNIA REPAIR Right    INTRAOPERATIVE TRANSESOPHAGEAL ECHOCARDIOGRAM N/A 11/19/2013   Procedure: INTRAOPERATIVE TRANSESOPHAGEAL ECHOCARDIOGRAM;  Surgeon: Gaye Pollack, MD;  Location: [facility];  Service: Open Heart Surgery;  Laterality: N/A;   KNEE ARTHROSCOPY Left 05/09/2009   Dr Collier Salina   LAMINECTOMY  06/09/2005   Dr Joya Salm. multiple lumbar level laminectomies.    LEFT AND RIGHT HEART CATHETERIZATION WITH CORONARY ANGIOGRAM N/A 10/01/2013   Procedure: LEFT AND RIGHT HEART CATHETERIZATION WITH CORONARY ANGIOGRAM;  Surgeon: Candee Furbish, MD;  Location: [facility] CATH LAB;  Service: Cardiovascular;  Laterality: N/A;   SPINAL FUSION     lumbar   TEE WITHOUT CARDIOVERSION  11/07/2003   Aoritic valve sclerosis.    TONSILLECTOMY     VIDEO BRONCHOSCOPY WITH ENDOBRONCHIAL ULTRASOUND N/A 06/02/2022   Procedure: VIDEO BRONCHOSCOPY  WITH ENDOBRONCHIAL ULTRASOUND;  Surgeon: Margaretha Seeds, MD;  Location: [facility];  Service: Thoracic;  Laterality: N/A;    REVIEW OF SYSTEMS:   Review of Systems  Constitutional: Negative for appetite change, chills, fatigue, fever and unexpected weight change.  HENT: Negative for mouth sores, nosebleeds, sore throat and trouble swallowing.  Eyes: Negative for eye problems and icterus.  Respiratory: Positive for cough.  Negative for hemoptysis, shortness of breath, and wheezing.   Cardiovascular: Negative for chest pain and leg swelling.  Gastrointestinal: Negative for abdominal pain, constipation (none at this time), diarrhea, nausea and vomiting.  Genitourinary: Negative for bladder incontinence, difficulty urinating, dysuria, frequency and hematuria.   Musculoskeletal: Negative for back pain, gait problem, neck pain and neck stiffness.  Skin: Negative for itching and rash.  Neurological: Positive for occasional headaches.  Negative for dizziness, extremity weakness, gait problem, light-headedness and seizures.  Hematological: Negative  for adenopathy. Does not bruise/bleed easily.  Psychiatric/Behavioral: Negative for confusion, depression and sleep disturbance. The patient is not nervous/anxious. .   PHYSICAL EXAMINATION:  Blood pressure (!) 139/59, pulse 83, temperature 97.6 F (36.4 C), temperature source Oral, resp. rate 14, weight 137 lb 1.6 oz (62.2 kg), SpO2 100 %.  ECOG PERFORMANCE STATUS: 1  Physical Exam  Constitutional: Oriented to person, place, and time and thin appearing male and in no distress.   HENT:  Head: Normocephalic and atraumatic.  Mouth/Throat: Oropharynx is clear and moist. No oropharyngeal exudate.  Eyes: Conjunctivae are normal. Right eye exhibits no discharge. Left eye exhibits no discharge. No scleral icterus.  Neck: Normal range of motion. Neck supple.  Cardiovascular: Normal rate, regular rhythm, murmur noted and intact distal pulses.    Pulmonary/Chest: Effort normal and breath sounds normal. No respiratory distress. No wheezes. No rales.  Abdominal: Soft. Bowel sounds are normal. Exhibits no distension and no mass. There is no tenderness.  Musculoskeletal: Normal range of motion. Exhibits no edema.  Lymphadenopathy:    No cervical adenopathy.  Neurological: Alert and oriented to person, place, and time. Exhibits muscle wasting.  Gait normal. Coordination normal.  Skin: Skin is warm and dry. No rash noted. Not diaphoretic. No erythema. No pallor.  Psychiatric: Mood, memory and judgment normal.  Vitals reviewed.  LABORATORY DATA: Lab Results  Component Value Date   WBC 11.0 (H) 06/20/2022   HGB 9.1 (L) 06/20/2022   HCT 28.0 (L) 06/20/2022   MCV 77.1 (L) 06/20/2022   PLT 326 06/20/2022      Chemistry      Component Value Date/Time   NA 134 (L) 06/20/2022 1012   K 4.6 06/20/2022 1012   CL 100 06/20/2022 1012   CO2 28 06/20/2022 1012   BUN 14 06/20/2022 1012   CREATININE 0.82 06/20/2022 1012      Component Value Date/Time   CALCIUM 10.5 (H) 06/20/2022 1012   ALKPHOS 61 06/20/2022 1012   AST 16 06/20/2022 1012   ALT 9 06/20/2022 1012   BILITOT 0.3 06/20/2022 1012       RADIOGRAPHIC STUDIES:  No results found.   ASSESSMENT/PLAN:  This is a very pleasant 82 year old African-American male diagnosed with stage III (T3, N2, M0) non-small cell lung cancer, squamous cell carcinoma.  He presented with a right apical lung mass and suspicious precarinal lymph node involvement.  PD-L1 is 40%.Marland Kitchen He was diagnosed in January 2024.   Currently undergoing a course of concurrent chemoradiation with carboplatin for an AUC of 2 and paclitaxel 45 mg/m.  He is status post 1 cycle and he tolerated well.  His last day radiation is tentatively scheduled for 08/05/2022.  His last visit is after the appointment with me as it was a lab add-on.  As long as his labs are within parameters, he may recommend he proceed with cycle #2  today scheduled.  We will see the patient back for follow-up visit in 2 weeks for evaluation repeat blood work before undergoing cycle #4. Will continue to take Tessalon for his cough.  He may continue to take prune juice for constipation.  Also discussed he may take laxatives if needed.  I have adjusted the patient's schedule.  He will come in tomorrow for his last dose of Venofer.  I have canceled his Venofer appointment for Friday.  Patient is scheduled to have a Port-A-Cath later this week.  I will make sure that his labs moving forward her labs/flush.  Discussed that next  week when he comes in for treatment to use ice on his Port-A-Cath site as it may be too early to use Emla cream.  The patient was advised to call immediately if he has any concerning symptoms in the interval. The patient voices understanding of current disease status and treatment options and is in agreement with the current care plan. All questions were answered. The patient knows to call the clinic with any problems, questions or concerns. We can certainly see the patient much sooner if necessary     No orders of the defined types were placed in this encounter.    The total time spent in the appointment was 20-29 minutes.   Mamoru Takeshita L Shefali Ng, PA-C 06/27/22

## 2022-06-27 ENCOUNTER — Inpatient Hospital Stay: Payer: Medicare Other

## 2022-06-27 ENCOUNTER — Other Ambulatory Visit: Payer: Medicare Other

## 2022-06-27 ENCOUNTER — Ambulatory Visit (INDEPENDENT_AMBULATORY_CARE_PROVIDER_SITE_OTHER): Payer: Medicare Other | Admitting: Pulmonary Disease

## 2022-06-27 ENCOUNTER — Other Ambulatory Visit: Payer: Self-pay

## 2022-06-27 ENCOUNTER — Inpatient Hospital Stay (HOSPITAL_BASED_OUTPATIENT_CLINIC_OR_DEPARTMENT_OTHER): Payer: Medicare Other | Admitting: Physician Assistant

## 2022-06-27 ENCOUNTER — Ambulatory Visit
Admission: RE | Admit: 2022-06-27 | Discharge: 2022-06-27 | Disposition: A | Discharge status: Home / Self Care | Payer: Medicare Other | Source: Ambulatory Visit | Attending: Radiation Oncology | Admitting: Radiation Oncology

## 2022-06-27 ENCOUNTER — Encounter (HOSPITAL_BASED_OUTPATIENT_CLINIC_OR_DEPARTMENT_OTHER): Payer: Self-pay | Admitting: Pulmonary Disease

## 2022-06-27 VITALS — BP 139/59 | HR 83 | Temp 97.6°F | Resp 14 | Wt 137.1 lb

## 2022-06-27 VITALS — BP 106/60 | HR 80 | Ht 71.0 in | Wt 137.2 lb

## 2022-06-27 DIAGNOSIS — Z5111 Encounter for antineoplastic chemotherapy: Secondary | ICD-10-CM

## 2022-06-27 DIAGNOSIS — R918 Other nonspecific abnormal finding of lung field: Secondary | ICD-10-CM | POA: Diagnosis not present

## 2022-06-27 DIAGNOSIS — Z510 Encounter for antineoplastic radiation therapy: Secondary | ICD-10-CM | POA: Diagnosis not present

## 2022-06-27 DIAGNOSIS — C3491 Malignant neoplasm of unspecified part of right bronchus or lung: Secondary | ICD-10-CM

## 2022-06-27 DIAGNOSIS — D509 Iron deficiency anemia, unspecified: Secondary | ICD-10-CM | POA: Diagnosis not present

## 2022-06-27 DIAGNOSIS — J449 Chronic obstructive pulmonary disease, unspecified: Secondary | ICD-10-CM | POA: Diagnosis not present

## 2022-06-27 DIAGNOSIS — Z87891 Personal history of nicotine dependence: Secondary | ICD-10-CM | POA: Diagnosis not present

## 2022-06-27 DIAGNOSIS — E119 Type 2 diabetes mellitus without complications: Secondary | ICD-10-CM | POA: Diagnosis not present

## 2022-06-27 DIAGNOSIS — I10 Essential (primary) hypertension: Secondary | ICD-10-CM | POA: Diagnosis not present

## 2022-06-27 DIAGNOSIS — C3411 Malignant neoplasm of upper lobe, right bronchus or lung: Secondary | ICD-10-CM | POA: Diagnosis not present

## 2022-06-27 LAB — PULMONARY FUNCTION TEST
DL/VA % pred: 120 %
DL/VA: 4.69 ml/min/mmHg/L
DLCO cor % pred: 93 %
DLCO cor: 23.74 ml/min/mmHg
DLCO unc % pred: 75 %
DLCO unc: 19.01 ml/min/mmHg
FEF 25-75 Pre: 1.66 L/sec
FEF2575-%Pred-Pre: 78 %
FEV1-%Pred-Pre: 76 %
FEV1-Pre: 2.32 L
FEV1FVC-%Pred-Pre: 98 %
FEV6-%Pred-Pre: 80 %
FEV6-Pre: 3.2 L
FEV6FVC-%Pred-Pre: 106 %
FVC-%Pred-Pre: 77 %
FVC-Pre: 3.29 L
Pre FEV1/FVC ratio: 71 %
Pre FEV6/FVC Ratio: 100 %
RV % pred: 128 %
RV: 3.45 L
TLC % pred: 79 %
TLC: 5.79 L

## 2022-06-27 LAB — CMP (CANCER CENTER ONLY)
ALT: 12 U/L (ref 0–44)
AST: 16 U/L (ref 15–41)
Albumin: 3.1 g/dL — ABNORMAL LOW (ref 3.5–5.0)
Alkaline Phosphatase: 51 U/L (ref 38–126)
Anion gap: 7 (ref 5–15)
BUN: 16 mg/dL (ref 8–23)
CO2: 27 mmol/L (ref 22–32)
Calcium: 9.7 mg/dL (ref 8.9–10.3)
Chloride: 98 mmol/L (ref 98–111)
Creatinine: 0.84 mg/dL (ref 0.61–1.24)
GFR, Estimated: >60 mL/min (ref >60)
Glucose, Bld: 270 mg/dL — ABNORMAL HIGH (ref 70–99)
Potassium: 4 mmol/L (ref 3.5–5.1)
Sodium: 132 mmol/L — ABNORMAL LOW (ref 135–145)
Total Bilirubin: 0.3 mg/dL (ref 0.3–1.2)
Total Protein: 7.4 g/dL (ref 6.5–8.1)

## 2022-06-27 LAB — RAD ONC ARIA SESSION SUMMARY
Course Elapsed Days: 5
Plan Fractions Treated to Date: 4
Plan Prescribed Dose Per Fraction: 2 Gy
Plan Total Fractions Prescribed: 30
Plan Total Prescribed Dose: 60 Gy
Reference Point Dosage Given to Date: 8 Gy
Reference Point Session Dosage Given: 2 Gy
Session Number: 4

## 2022-06-27 LAB — CBC WITH DIFFERENTIAL (CANCER CENTER ONLY)
Abs Immature Granulocytes: 0.08 10*3/uL — ABNORMAL HIGH (ref 0.00–0.07)
Basophils Absolute: 0.1 10*3/uL (ref 0.0–0.1)
Basophils Relative: 1 %
Eosinophils Absolute: 0.3 10*3/uL (ref 0.0–0.5)
Eosinophils Relative: 4 %
HCT: 27.1 % — ABNORMAL LOW (ref 39.0–52.0)
Hemoglobin: 8.6 g/dL — ABNORMAL LOW (ref 13.0–17.0)
Immature Granulocytes: 1 %
Lymphocytes Relative: 9 %
Lymphs Abs: 0.7 10*3/uL (ref 0.7–4.0)
MCH: 24.7 pg — ABNORMAL LOW (ref 26.0–34.0)
MCHC: 31.7 g/dL (ref 30.0–36.0)
MCV: 77.9 fL — ABNORMAL LOW (ref 80.0–100.0)
Monocytes Absolute: 1.2 10*3/uL — ABNORMAL HIGH (ref 0.1–1.0)
Monocytes Relative: 14 %
Neutro Abs: 5.8 10*3/uL (ref 1.7–7.7)
Neutrophils Relative %: 71 %
Platelet Count: 340 10*3/uL (ref 150–400)
RBC: 3.48 MIL/uL — ABNORMAL LOW (ref 4.22–5.81)
RDW: 19.3 % — ABNORMAL HIGH (ref 11.5–15.5)
WBC Count: 8.2 10*3/uL (ref 4.0–10.5)
nRBC: 0 % (ref 0.0–0.2)

## 2022-06-27 MED ORDER — FAMOTIDINE IN NACL 20-0.9 MG/50ML-% IV SOLN
20.0000 mg | Freq: Once | INTRAVENOUS | Status: AC
  Administered 2022-06-27: 20 mg via INTRAVENOUS
  Filled 2022-06-27: qty 50

## 2022-06-27 MED ORDER — SODIUM CHLORIDE 0.9 % IV SOLN
10.0000 mg | Freq: Once | INTRAVENOUS | Status: AC
  Administered 2022-06-27: 10 mg via INTRAVENOUS
  Filled 2022-06-27: qty 10

## 2022-06-27 MED ORDER — SODIUM CHLORIDE 0.9 % IV SOLN: Freq: Once | INTRAVENOUS | Status: AC

## 2022-06-27 MED ORDER — SODIUM CHLORIDE 0.9 % IV SOLN
153.0000 mg | Freq: Once | INTRAVENOUS | Status: AC
  Administered 2022-06-27: 150 mg via INTRAVENOUS
  Filled 2022-06-27: qty 15

## 2022-06-27 MED ORDER — PALONOSETRON HCL INJECTION 0.25 MG/5ML
0.2500 mg | Freq: Once | INTRAVENOUS | Status: AC
  Administered 2022-06-27: 0.25 mg via INTRAVENOUS
  Filled 2022-06-27: qty 5

## 2022-06-27 MED ORDER — FLUTICASONE-SALMETEROL 115-21 MCG/ACT IN AERO: 2.0000 | INHALATION_SPRAY | Freq: Two times a day (BID) | RESPIRATORY_TRACT | 11 refills | Status: DC

## 2022-06-27 MED ORDER — DIPHENHYDRAMINE HCL 50 MG/ML IJ SOLN
50.0000 mg | Freq: Once | INTRAMUSCULAR | Status: AC
  Administered 2022-06-27: 50 mg via INTRAVENOUS
  Filled 2022-06-27: qty 1

## 2022-06-27 MED ORDER — SODIUM CHLORIDE 0.9 % IV SOLN
45.0000 mg/m2 | Freq: Once | INTRAVENOUS | Status: AC
  Administered 2022-06-27: 78 mg via INTRAVENOUS
  Filled 2022-06-27: qty 13

## 2022-06-27 NOTE — Patient Instructions (Signed)
Attempted Full PFT Today:Performed Full PFT minus Post Spirometry.

## 2022-06-27 NOTE — Patient Instructions (Signed)
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

## 2022-06-27 NOTE — Patient Instructions (Signed)
[facility]   Discharge Instructions: Thank you for choosing [facility] to provide your oncology and hematology care.   If you have a lab appointment with the [facility], please go directly to the [facility] and check in at the registration area.   Wear comfortable clothing and clothing appropriate for easy access to any Portacath or PICC line.   We strive to give you quality time with your provider. You may need to reschedule your appointment if you arrive late (15 or more minutes).  Arriving late affects you and other patients whose appointments are after yours.  Also, if you miss three or more appointments without notifying the office, you may be dismissed from the clinic at the provider's discretion.      For prescription refill requests, have your pharmacy contact our office and allow 72 hours for refills to be completed.    Today you received the following chemotherapy and/or immunotherapy agents: Paclitaxel (Taxol) and Carboplatin       To help prevent nausea and vomiting after your treatment, we encourage you to take your nausea medication as directed.  BELOW ARE SYMPTOMS THAT SHOULD BE REPORTED IMMEDIATELY: *FEVER GREATER THAN 100.4 F (38 C) OR HIGHER *CHILLS OR SWEATING *NAUSEA AND VOMITING THAT IS NOT CONTROLLED WITH YOUR NAUSEA MEDICATION *UNUSUAL SHORTNESS OF BREATH *UNUSUAL BRUISING OR BLEEDING *URINARY PROBLEMS (pain or burning when urinating, or frequent urination) *BOWEL PROBLEMS (unusual diarrhea, constipation, pain near the anus) TENDERNESS IN MOUTH AND THROAT WITH OR WITHOUT PRESENCE OF ULCERS (sore throat, sores in mouth, or a toothache) UNUSUAL RASH, SWELLING OR PAIN  UNUSUAL VAGINAL DISCHARGE OR ITCHING   Items with * indicate a potential emergency and should be followed up as soon as possible or go to the Emergency Department if any problems should occur.  Please show the CHEMOTHERAPY ALERT CARD or  IMMUNOTHERAPY ALERT CARD at check-in to the Emergency Department and triage nurse.  Should you have questions after your visit or need to cancel or reschedule your appointment, please contact [facility]  Dept: [phone]  and follow the prompts.  Office hours are 8:00 a.m. to 4:30 p.m. Monday - Friday. Please note that voicemails left after 4:00 p.m. may not be returned until the following business day.  We are closed weekends and major holidays. You have access to a nurse at all times for urgent questions. Please call the main number to the clinic Dept: [phone] and follow the prompts.   For any non-urgent questions, you may also contact your provider using MyChart. We now offer e-Visits for anyone 86 and older to request care online for non-urgent symptoms. For details visit [email or web address].   Also download the MyChart app! Go to the app store, search "MyChart", open the app, select [facility], and log in with your MyChart username and password.

## 2022-06-27 NOTE — Progress Notes (Unsigned)
Subjective:   PATIENT ID: [name] GENDER: male DOB: July 03, 1940, MRN: [id number]  Chief Complaint  Patient presents with   Follow-up    PFT results    Reason for Visit: Follow-up  Mr. Rudra Hobbins is a 82 year old male with squamous cell carcinoma of the right lung, aortic stenosis s/p AVR, HTN, DM2, HLD chronic hepatitis C, hepatocellular carcinoma s/p ablation 2020, hx DVT, iron deficiency who presents for for follow-up.  He was recently evaluated by Oncology for paratracheal mass. He was referred in December after complaints of cough and had CXR demonstrating 3.7 x 5.6 x 3.1 cm right paratracheal density with mass effect on the trachea. Follow-up CT 05/05/22 with large apical lung mass concerning for malignancy seen. EBUS 06/02/22 consistent with squamous cell carcinoma of the lung. Started on radiation on 06/15/22 and  paclitaxel and carboplatin on 06/21/22.  06/27/22 Wife present and supportive. Since our last visit he is undergoing chemotherapy and radiation. Tolerating therapy fairly so far. Denies shortness of breath and wheezing. Unchanged cough that mainly nonproductive. Some chest congestion.   Social History: Former smoker x 30 years. 1.5 ppd. Quit 30 years ago.  Retired Previously worked at [employer]  Outpatient Medications Prior to Visit  Medication Sig Dispense Refill   acetaminophen (TYLENOL) 500 MG tablet Take 500 mg by mouth every 6 (six) hours as needed for mild pain or moderate pain.     amLODipine (NORVASC) 10 MG tablet Take 1 tablet (10 mg total) by mouth daily. 30 tablet 0   aspirin EC 81 MG EC tablet Take 1 tablet (81 mg total) by mouth daily.     atorvastatin (LIPITOR) 20 MG tablet Take 20 mg by mouth daily.     benzonatate (TESSALON) 100 MG capsule Take 1 capsule (100 mg total) by mouth 3 (three) times daily as needed for cough. 30 capsule 2   glucose blood test strip      glucose blood test strip as directed DX 250     hydrochlorothiazide  (HYDRODIURIL) 12.5 MG tablet Take 1 tablet (12.5 mg total) by mouth daily. 30 tablet 0   metFORMIN (GLUCOPHAGE) 1000 MG tablet Take 1,000 mg by mouth 2 (two) times daily with a meal.     Multiple Vitamin (MULTIVITAMIN WITH MINERALS) TABS tablet Take 1 tablet by mouth daily.     SF 5000 PLUS 1.1 % CREA dental cream Place 1 Application onto teeth daily.  2   tamsulosin (FLOMAX) 0.4 MG CAPS capsule Take 0.4 mg by mouth daily.     dextromethorphan-guaiFENesin (MUCINEX DM) 30-600 MG 12hr tablet Take 1 tablet by mouth 2 (two) times daily as needed for cough. (Patient not taking: Reported on 06/27/2022)     lidocaine-prilocaine (EMLA) cream Apply 1 Application topically as needed. (Patient not taking: Reported on 06/27/2022) 30 g 2   prochlorperazine (COMPAZINE) 10 MG tablet Take 1 tablet (10 mg total) by mouth every 6 (six) hours as needed. (Patient not taking: Reported on 06/27/2022) 30 tablet 2   No facility-administered medications prior to visit.    Review of Systems  Constitutional:  Negative for chills, diaphoresis, fever, malaise/fatigue and weight loss.  HENT:  Negative for congestion.   Respiratory:  Positive for cough. Negative for hemoptysis, sputum production, shortness of breath and wheezing.   Cardiovascular:  Negative for chest pain, palpitations and leg swelling.     Objective:   Vitals:   06/27/22 1420  BP: 106/60  Pulse: 80  SpO2: 95%  Weight: 137 lb 3.2 oz (62.2 kg)  Height: 5\' 11"  (1.803 m)   SpO2: 95 % O2 Device: None (Room air)  Physical Exam: General: Thin, cachectic-appearing, no acute distress HENT: [place], AT Eyes: EOMI, no scleral icterus Respiratory: Diminished to auscultation bilaterally.  No crackles, wheezing or rales Cardiovascular: RRR, -M/R/G, no JVD Extremities:-Edema,-tenderness Neuro: AAO x4, CNII-XII grossly intact Psych: Normal mood, normal affect  Data Reviewed:  Imaging: CT Chest 05/05/22 - Large centrally necrotic mass medially in right lung  apex. Moderate centrilobular and paraseptal emphysema MRI Brain 05/23/22 - No metastatic disease PET 2/44/01 - Hypermetabolic right apical lung mass and precarinal lymph node  PFT: 06/27/22 FVC 3.29 (77%) FEV1 2.32 (76%) Ratio 71  TLC 79% RV 128% RV/TLC 154% DLCO 75% Interpretation: Borderline low-normal spirometry with air trapping and reduced DLCO. Clinically correlate with emphysema/COPD   Labs: CBC    Component Value Date/Time   WBC 8.2 06/27/2022 0825   WBC 8.5 06/03/2022 0243   RBC 3.48 (L) 06/27/2022 0825   HGB 8.6 (L) 06/27/2022 0825   HCT 27.1 (L) 06/27/2022 0825   PLT 340 06/27/2022 0825   MCV 77.9 (L) 06/27/2022 0825   MCH 24.7 (L) 06/27/2022 0825   MCHC 31.7 06/27/2022 0825   RDW 19.3 (H) 06/27/2022 0825   LYMPHSABS 0.7 06/27/2022 0825   MONOABS 1.2 (H) 06/27/2022 0825   EOSABS 0.3 06/27/2022 0825   BASOSABS 0.1 06/27/2022 0825        Assessment & Plan:   Discussion: 82 year old male with stage IIIB squamous cell carcinoma of the right lung, aortic stenosis s/p AVR, HTN, DM2, HLD chronic hepatitis C, hepatocellular carcinoma s/p ablation 2020, hx DVT, iron deficiency who presents for follow-up. Reviewed PFTs. Discussed clinical course and management of COPD including bronchodilator regimen and action plan for exacerbation.   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  Meds ordered this encounter  Medications   fluticasone-salmeterol (ADVAIR HFA) 115-21 MCG/ACT inhaler    Sig: Inhale 2 puffs into the  lungs 2 (two) times daily.    Dispense:  1 each    Refill:  11    Return in about 3 months (around 09/25/2022).  I have spent a total time of 32-minutes on the day of the appointment including chart review, data review, collecting history, coordinating care and discussing medical diagnosis and plan with the patient/family. Past medical history, allergies, medications were reviewed. Pertinent imaging, labs and tests included in this note have been reviewed and interpreted independently by me.  [place], MD [facility] 06/27/2022 2:37 PM  Office Number [phone]

## 2022-06-27 NOTE — Progress Notes (Signed)
Attempted Full PFT Today:Performed Full PFT minus Post Spirometry.

## 2022-06-28 ENCOUNTER — Ambulatory Visit
Admission: RE | Admit: 2022-06-28 | Discharge: 2022-06-28 | Disposition: A | Discharge status: Home / Self Care | Payer: Medicare Other | Source: Ambulatory Visit | Attending: Radiation Oncology | Admitting: Radiation Oncology

## 2022-06-28 ENCOUNTER — Other Ambulatory Visit: Payer: Self-pay

## 2022-06-28 ENCOUNTER — Encounter (HOSPITAL_BASED_OUTPATIENT_CLINIC_OR_DEPARTMENT_OTHER): Payer: Self-pay | Admitting: Pulmonary Disease

## 2022-06-28 ENCOUNTER — Inpatient Hospital Stay: Payer: Medicare Other

## 2022-06-28 ENCOUNTER — Inpatient Hospital Stay: Payer: Medicare Other | Admitting: Internal Medicine

## 2022-06-28 VITALS — BP 125/64 | HR 65 | Temp 97.5°F | Resp 16

## 2022-06-28 DIAGNOSIS — D509 Iron deficiency anemia, unspecified: Secondary | ICD-10-CM

## 2022-06-28 DIAGNOSIS — C3411 Malignant neoplasm of upper lobe, right bronchus or lung: Secondary | ICD-10-CM

## 2022-06-28 DIAGNOSIS — Z87891 Personal history of nicotine dependence: Secondary | ICD-10-CM | POA: Diagnosis not present

## 2022-06-28 DIAGNOSIS — I10 Essential (primary) hypertension: Secondary | ICD-10-CM | POA: Diagnosis not present

## 2022-06-28 DIAGNOSIS — Z510 Encounter for antineoplastic radiation therapy: Secondary | ICD-10-CM | POA: Diagnosis not present

## 2022-06-28 DIAGNOSIS — E119 Type 2 diabetes mellitus without complications: Secondary | ICD-10-CM | POA: Diagnosis not present

## 2022-06-28 LAB — RAD ONC ARIA SESSION SUMMARY
Course Elapsed Days: 6
Plan Fractions Treated to Date: 5
Plan Prescribed Dose Per Fraction: 2 Gy
Plan Total Fractions Prescribed: 30
Plan Total Prescribed Dose: 60 Gy
Reference Point Dosage Given to Date: 10 Gy
Reference Point Session Dosage Given: 2 Gy
Session Number: 5

## 2022-06-28 MED ORDER — ACETAMINOPHEN 325 MG PO TABS
650.0000 mg | ORAL_TABLET | Freq: Once | ORAL | Status: AC
  Administered 2022-06-28: 650 mg via ORAL
  Filled 2022-06-28: qty 2

## 2022-06-28 MED ORDER — LORATADINE 10 MG PO TABS
10.0000 mg | ORAL_TABLET | Freq: Once | ORAL | Status: AC
  Administered 2022-06-28: 10 mg via ORAL
  Filled 2022-06-28: qty 1

## 2022-06-28 MED ORDER — SODIUM CHLORIDE 0.9 % IV SOLN: Freq: Once | INTRAVENOUS | Status: AC

## 2022-06-28 MED ORDER — SONAFINE EX EMUL
1.0000 | Freq: Two times a day (BID) | CUTANEOUS | Status: DC
  Administered 2022-06-28: 1 via TOPICAL

## 2022-06-28 MED ORDER — SODIUM CHLORIDE 0.9 % IV SOLN
300.0000 mg | Freq: Once | INTRAVENOUS | Status: AC
  Administered 2022-06-28: 300 mg via INTRAVENOUS
  Filled 2022-06-28: qty 300

## 2022-06-28 NOTE — Progress Notes (Signed)
Pt here for patient teaching. Pt given Radiation and You booklet, skin care instructions, and Sonafine. Reviewed areas of pertinence such as diarrhea, fatigue, hair loss, nausea and vomiting, skin changes, throat changes, blurry vision, breast tenderness, breast swelling, cough, shortness of breath, and taste changes . Pt able to give teach back of to pat skin, use unscented/gentle soap, and drink plenty of water, apply Sonafine bid, avoid applying anything to skin within 4 hours of treatment, and to use an electric razor if they must shave. Pt verbalizes understanding of information given and will contact nursing with any questions or concerns.     [email or web address]/treatmentinformation/whattoexpect/index

## 2022-06-28 NOTE — Progress Notes (Signed)
Patient stayed for 30 min post obs, VSS at discharge.  No c/o SOB, lightheadedness.  Ambulated to lobby.

## 2022-06-28 NOTE — Patient Instructions (Signed)

## 2022-06-29 ENCOUNTER — Ambulatory Visit
Admission: RE | Admit: 2022-06-29 | Discharge: 2022-06-29 | Disposition: A | Discharge status: Home / Self Care | Payer: Medicare Other | Source: Ambulatory Visit | Attending: Radiation Oncology | Admitting: Radiation Oncology

## 2022-06-29 ENCOUNTER — Other Ambulatory Visit: Payer: Self-pay | Admitting: Internal Medicine

## 2022-06-29 ENCOUNTER — Other Ambulatory Visit: Payer: Self-pay

## 2022-06-29 DIAGNOSIS — I10 Essential (primary) hypertension: Secondary | ICD-10-CM | POA: Diagnosis not present

## 2022-06-29 DIAGNOSIS — E119 Type 2 diabetes mellitus without complications: Secondary | ICD-10-CM | POA: Diagnosis not present

## 2022-06-29 DIAGNOSIS — Z510 Encounter for antineoplastic radiation therapy: Secondary | ICD-10-CM | POA: Diagnosis not present

## 2022-06-29 DIAGNOSIS — D509 Iron deficiency anemia, unspecified: Secondary | ICD-10-CM | POA: Diagnosis not present

## 2022-06-29 DIAGNOSIS — C3411 Malignant neoplasm of upper lobe, right bronchus or lung: Secondary | ICD-10-CM | POA: Diagnosis not present

## 2022-06-29 DIAGNOSIS — C3491 Malignant neoplasm of unspecified part of right bronchus or lung: Secondary | ICD-10-CM

## 2022-06-29 DIAGNOSIS — Z87891 Personal history of nicotine dependence: Secondary | ICD-10-CM | POA: Diagnosis not present

## 2022-06-29 LAB — RAD ONC ARIA SESSION SUMMARY
Course Elapsed Days: 7
Plan Fractions Treated to Date: 6
Plan Prescribed Dose Per Fraction: 2 Gy
Plan Total Fractions Prescribed: 30
Plan Total Prescribed Dose: 60 Gy
Reference Point Dosage Given to Date: 12 Gy
Reference Point Session Dosage Given: 2 Gy
Session Number: 6

## 2022-06-29 NOTE — H&P (Signed)
Chief Complaint: Patient was seen in consultation today for non-small cell lung cancer at the request of Heilingoetter,Cassandra L  Referring Physician(s): Heilingoetter,Cassandra L  Supervising Physician: Markus Daft  Patient Status: [place]  History of Present Illness: [name] is a 82 y.o. male who presented to PCP 04/28/22 c/o congestion and cough x several weeks. CXR at that time showed right paratracheal density with mass effect on trachea concerning for mass lesion. CT chest performed 05/05/22 demonstrated large centrally necrotic mass medially at the right lung apex highly suspicious for primary bronchogenic carcinoma. Pt was referred to oncology for management. Biopsy via brochospcy 06/02/22 were consistent with squamous cell carcinoma. Pt has been referred to IR for tunneled catheter with port for treatment.    Past Medical History:  Diagnosis Date   Acute epididymo-orchitis    Aortic stenosis    s/p bioprosthetic AVR (Dr. Cyndia Bent) 11/2013   Arthritis    Diabetes mellitus without complication [facility])    ED (erectile dysfunction)    Essential hypertension, benign    Heart murmur    Hep C w/o coma, chronic ([place])    History of blood transfusion    History of DVT of lower extremity    Hx of echocardiogram    Echo (8/15):  Severe LVH, EF 60-65%, no RWMA, Gr 3 DD, AVR ok (mean 21 mmHg), MAC, mild LAE, mild to mod RAE   Hypercholesterolemia    PONV (postoperative nausea and vomiting)     Past Surgical History:  Procedure Laterality Date   AORTIC VALVE REPLACEMENT N/A 11/19/2013   Procedure: AORTIC VALVE REPLACEMENT (AVR);  Surgeon: Gaye Pollack, MD;  Location: [facility];  Service: Open Heart Surgery;  Laterality: N/A;   BUNIONECTOMY Bilateral 05/09/2006   CARDIAC CATHETERIZATION  10/01/2013   COLONOSCOPY  01/07/2010   internal hemorrhoids.  Dr Deatra Ina   ESOPHAGOGASTRODUODENOSCOPY N/A 04/24/2013   Procedure: ESOPHAGOGASTRODUODENOSCOPY (EGD);  Surgeon: Jerene Bears,  MD;  Location: [facility] ENDOSCOPY;  Service: Endoscopy;  Laterality: N/A;   INGUINAL HERNIA REPAIR Right    INTRAOPERATIVE TRANSESOPHAGEAL ECHOCARDIOGRAM N/A 11/19/2013   Procedure: INTRAOPERATIVE TRANSESOPHAGEAL ECHOCARDIOGRAM;  Surgeon: Gaye Pollack, MD;  Location: [facility] OR;  Service: Open Heart Surgery;  Laterality: N/A;   KNEE ARTHROSCOPY Left 05/09/2009   Dr Collier Salina   LAMINECTOMY  06/09/2005   Dr Joya Salm. multiple lumbar level laminectomies.    LEFT AND RIGHT HEART CATHETERIZATION WITH CORONARY ANGIOGRAM N/A 10/01/2013   Procedure: LEFT AND RIGHT HEART CATHETERIZATION WITH CORONARY ANGIOGRAM;  Surgeon: Candee Furbish, MD;  Location: [facility] CATH LAB;  Service: Cardiovascular;  Laterality: N/A;   SPINAL FUSION     lumbar   TEE WITHOUT CARDIOVERSION  11/07/2003   Aoritic valve sclerosis.    TONSILLECTOMY     VIDEO BRONCHOSCOPY WITH ENDOBRONCHIAL ULTRASOUND N/A 06/02/2022   Procedure: VIDEO BRONCHOSCOPY WITH ENDOBRONCHIAL ULTRASOUND;  Surgeon: Margaretha Seeds, MD;  Location: [facility] OR;  Service: Thoracic;  Laterality: N/A;    Allergies: Azithromycin, Lisinopril, Ezetimibe-simvastatin, Metoprolol, and Rosuvastatin  Medications: Prior to Admission medications   Medication Sig Start Date End Date Taking? Authorizing Provider  acetaminophen (TYLENOL) 500 MG tablet Take 500 mg by mouth every 6 (six) hours as needed for mild pain or moderate pain.    [provider]  amLODipine (NORVASC) 10 MG tablet Take 1 tablet (10 mg total) by mouth daily. 06/06/22   Amin, Jeanella Flattery, MD  aspirin EC 81 MG EC tablet Take 1 tablet (81 mg total) by mouth  daily. 11/24/13   Jadene Pierini E, PA-C  atorvastatin (LIPITOR) 20 MG tablet Take 20 mg by mouth daily.    [provider]  benzonatate (TESSALON) 100 MG capsule Take 1 capsule (100 mg total) by mouth 3 (three) times daily as needed for cough. 06/22/22   Heilingoetter, Cassandra L, PA-C  dextromethorphan-guaiFENesin (MUCINEX DM) 30-600 MG 12hr tablet Take 1  tablet by mouth 2 (two) times daily as needed for cough. Patient not taking: Reported on 06/27/2022    [provider]  fluticasone-salmeterol (ADVAIR HFA) 115-21 MCG/ACT inhaler Inhale 2 puffs into the lungs 2 (two) times daily. 06/27/22   Margaretha Seeds, MD  glucose blood test strip  01/15/14   [provider]  glucose blood test strip as directed DX 250 01/15/14   [provider]  hydrochlorothiazide (HYDRODIURIL) 12.5 MG tablet Take 1 tablet (12.5 mg total) by mouth daily. 06/06/22   Amin, Jeanella Flattery, MD  lidocaine-prilocaine (EMLA) cream Apply 1 Application topically as needed. Patient not taking: Reported on 06/27/2022 06/17/22   Heilingoetter, Cassandra L, PA-C  metFORMIN (GLUCOPHAGE) 1000 MG tablet Take 1,000 mg by mouth 2 (two) times daily with a meal.    [provider]  Multiple Vitamin (MULTIVITAMIN WITH MINERALS) TABS tablet Take 1 tablet by mouth daily.    [provider]  prochlorperazine (COMPAZINE) 10 MG tablet Take 1 tablet (10 mg total) by mouth every 6 (six) hours as needed. Patient not taking: Reported on 06/27/2022 06/09/22   Heilingoetter, Cassandra L, PA-C  SF 5000 PLUS 1.1 % CREA dental cream Place 1 Application onto teeth daily. 02/20/18   [provider]  tamsulosin (FLOMAX) 0.4 MG CAPS capsule Take 0.4 mg by mouth daily. 12/24/19   [provider]     Family History  Problem Relation Age of Onset   Diabetes Father    Stroke Father    Diabetes Mother    Stroke Mother    Hypertension Unknown        family history   Heart attack Neg Hx     Social History   Socioeconomic History   Marital status: Married    Spouse name: Not on file   Number of children: Not on file   Years of education: Not on file   Highest education level: Not on file  Occupational History   Not on file  Tobacco Use   Smoking status: Former    Packs/day: 1.50    Years: 31.00    Total pack years: 46.50    Types: Cigarettes     Quit date: 50    Years since quitting: 31.1   Smokeless tobacco: Never  Vaping Use   Vaping Use: Never used  Substance and Sexual Activity   Alcohol use: No   Drug use: No   Sexual activity: Not on file  Other Topics Concern   Not on file  Social History Narrative   Not on file   Social Determinants of Health   Financial Resource Strain: Not on file  Food Insecurity: No Food Insecurity (06/04/2022)   Hunger Vital Sign    Worried About Running Out of Food in the Last Year: Never true    Ran Out of Food in the Last Year: Never true  Transportation Needs: No Transportation Needs (06/04/2022)   PRAPARE - Hydrologist (Medical): No    Lack of Transportation (Non-Medical): No  Physical Activity: Not on file  Stress: Not on file  Social Connections: Not on file      Review of Systems: A 12 point ROS discussed and pertinent positives are indicated in the HPI above.  All other systems are negative.  Review of Systems  Vital Signs: There were no vitals taken for this visit.  Advance Care Plan: {Advance Care XBDZ:32992}    Physical Exam  Imaging: No results found.  Labs:  CBC: Recent Labs    06/03/22 0243 06/09/22 1002 06/20/22 1012 06/27/22 0825  WBC 8.5 11.8* 11.0* 8.2  HGB 8.7* 9.8* 9.1* 8.6*  HCT 27.3* 30.4* 28.0* 27.1*  PLT 333 364 326 340    COAGS: No results for input(s): "INR", "APTT" in the last 8760 hours.  BMP: Recent Labs    06/06/22 0504 06/09/22 1002 06/20/22 1012 06/27/22 0825  NA 137 136 134* 132*  K 3.0* 4.8 4.6 4.0  CL 97* 102 100 98  CO2 30 27 28 27   GLUCOSE 109* 158* 185* 270*  BUN 5* 20 14 16   CALCIUM 8.7* 10.8* 10.5* 9.7  CREATININE 0.82 0.84 0.82 0.84  GFRNONAA >60 >60 >60 >60    LIVER FUNCTION TESTS: Recent Labs    05/12/22 1335 06/06/22 0504 06/09/22 1002 06/20/22 1012 06/27/22 0825  BILITOT 0.2*  --  0.4 0.3 0.3  AST 14*  --  12* 16 16  ALT 6  --  8 9 12   ALKPHOS 52  --  57 61 51   PROT 7.9  --  8.4* 8.2* 7.4  ALBUMIN 3.6 2.5* 3.4* 3.4* 3.1*    TUMOR MARKERS: No results for input(s): "AFPTM", "CEA", "CA199", "CHROMGRNA" in the last 8760 hours.  Assessment and Plan:  82 yo male with PMH of bioprosthetic AVR, DM II, heart murmur, HCV, HTN and PONV presents to IR for tunneled catheter with port placement for treatment of newly diagnosed non-small cell lung cancer.   Risks and benefits of image guided tunneled catheter with port placement with moderate sedation was discussed with the patient including, but not limited to bleeding, infection, pneumothorax, or fibrin sheath development and need for additional procedures.  All of the patient's questions were answered, patient is agreeable to proceed. Consent signed and in chart. Thank you for this interesting consult.  I greatly enjoyed meeting [name] and look forward to participating in their care.  A copy of this report was sent to the requesting provider on this date.  Electronically Signed: Tyson Alias, NP 06/29/2022, 3:42 PM   I spent a total of {New EQAS:[id number]} {New Out-Pt:[id number]}  {Established Out-Pt:304952003} in face to face in clinical consultation, greater than 50% of which was counseling/coordinating care for non-small cell lung cancer.

## 2022-06-30 ENCOUNTER — Encounter (HOSPITAL_COMMUNITY): Payer: Self-pay

## 2022-06-30 ENCOUNTER — Ambulatory Visit (HOSPITAL_COMMUNITY)
Admission: RE | Admit: 2022-06-30 | Discharge: 2022-06-30 | Disposition: A | Discharge status: Home / Self Care | Payer: Medicare Other | Source: Ambulatory Visit | Attending: Physician Assistant | Admitting: Physician Assistant

## 2022-06-30 ENCOUNTER — Other Ambulatory Visit: Payer: Self-pay

## 2022-06-30 ENCOUNTER — Ambulatory Visit
Admission: RE | Admit: 2022-06-30 | Discharge: 2022-06-30 | Disposition: A | Discharge status: Home / Self Care | Payer: Medicare Other | Source: Ambulatory Visit | Attending: Radiation Oncology | Admitting: Radiation Oncology

## 2022-06-30 DIAGNOSIS — Z87891 Personal history of nicotine dependence: Secondary | ICD-10-CM | POA: Insufficient documentation

## 2022-06-30 DIAGNOSIS — E119 Type 2 diabetes mellitus without complications: Secondary | ICD-10-CM | POA: Insufficient documentation

## 2022-06-30 DIAGNOSIS — I10 Essential (primary) hypertension: Secondary | ICD-10-CM | POA: Insufficient documentation

## 2022-06-30 DIAGNOSIS — C3411 Malignant neoplasm of upper lobe, right bronchus or lung: Secondary | ICD-10-CM | POA: Diagnosis not present

## 2022-06-30 DIAGNOSIS — Z452 Encounter for adjustment and management of vascular access device: Secondary | ICD-10-CM | POA: Diagnosis not present

## 2022-06-30 DIAGNOSIS — Z953 Presence of xenogenic heart valve: Secondary | ICD-10-CM | POA: Insufficient documentation

## 2022-06-30 DIAGNOSIS — D509 Iron deficiency anemia, unspecified: Secondary | ICD-10-CM | POA: Diagnosis not present

## 2022-06-30 DIAGNOSIS — Z510 Encounter for antineoplastic radiation therapy: Secondary | ICD-10-CM | POA: Diagnosis not present

## 2022-06-30 DIAGNOSIS — C3491 Malignant neoplasm of unspecified part of right bronchus or lung: Secondary | ICD-10-CM | POA: Insufficient documentation

## 2022-06-30 HISTORY — PX: IR IMAGING GUIDED PORT INSERTION: IMG5740

## 2022-06-30 LAB — GLUCOSE, CAPILLARY: Glucose-Capillary: 195 mg/dL — ABNORMAL HIGH (ref 70–99)

## 2022-06-30 LAB — RAD ONC ARIA SESSION SUMMARY
Course Elapsed Days: 8
Plan Fractions Treated to Date: 7
Plan Prescribed Dose Per Fraction: 2 Gy
Plan Total Fractions Prescribed: 30
Plan Total Prescribed Dose: 60 Gy
Reference Point Dosage Given to Date: 14 Gy
Reference Point Session Dosage Given: 2 Gy
Session Number: 7

## 2022-06-30 MED ORDER — MIDAZOLAM HCL 2 MG/2ML IJ SOLN
INTRAMUSCULAR | Status: AC | PRN
  Administered 2022-06-30 (×2): 1 mg via INTRAVENOUS

## 2022-06-30 MED ORDER — LIDOCAINE HCL 1 % IJ SOLN
INTRAMUSCULAR | Status: AC
  Filled 2022-06-30: qty 20

## 2022-06-30 MED ORDER — HEPARIN SOD (PORK) LOCK FLUSH 100 UNIT/ML IV SOLN
INTRAVENOUS | Status: AC
  Administered 2022-06-30: 500 [IU] via INTRAVENOUS
  Filled 2022-06-30: qty 5

## 2022-06-30 MED ORDER — SODIUM CHLORIDE 0.9 % IV SOLN: INTRAVENOUS | Status: DC

## 2022-06-30 MED ORDER — FENTANYL CITRATE (PF) 100 MCG/2ML IJ SOLN
INTRAMUSCULAR | Status: AC | PRN
  Administered 2022-06-30 (×2): 50 ug via INTRAVENOUS

## 2022-06-30 MED ORDER — MIDAZOLAM HCL 2 MG/2ML IJ SOLN
INTRAMUSCULAR | Status: AC
  Filled 2022-06-30: qty 2

## 2022-06-30 MED ORDER — FENTANYL CITRATE (PF) 100 MCG/2ML IJ SOLN
INTRAMUSCULAR | Status: AC
  Filled 2022-06-30: qty 2

## 2022-06-30 MED ORDER — LIDOCAINE-EPINEPHRINE 1 %-1:100000 IJ SOLN
INTRAMUSCULAR | Status: AC
  Administered 2022-06-30: 10 mL
  Filled 2022-06-30: qty 1

## 2022-06-30 NOTE — Procedures (Signed)
Interventional Radiology Procedure:   Indications: Lung cancer  Procedure: Port placement  Findings: Right jugular port, tip at SVC/RA junction  Complications: None     EBL: Minimal, less than 10 ml  Plan: Discharge in one hour.  Keep port site and incisions dry for at least 24 hours.     Sugar Vanzandt R. Anselm Pancoast, MD  Pager: [phone]

## 2022-06-30 NOTE — Discharge Instructions (Signed)
Discharge Instructions:   Please call Interventional Radiology clinic [phone] with any questions or concerns.  You may remove your dressing and shower tomorrow.  Do not use EMLA / Lidocaine cream for 2 weeks post Port Insertion this will remove the surgical glue.  Moderate Conscious Sedation, Adult, Care After This sheet gives you information about how to care for yourself after your procedure. Your health care provider may also give you more specific instructions. If you have problems or questions, contact your health care provider. What can I expect after the procedure? After the procedure, it is common to have: Sleepiness for several hours. Impaired judgment for several hours. Difficulty with balance. Vomiting if you eat too soon. Follow these instructions at home: For the time period you were told by your health care provider: Rest. Do not participate in activities where you could fall or become injured. Do not drive or use machinery. Do not drink alcohol. Do not take sleeping pills or medicines that cause drowsiness. Do not make important decisions or sign legal documents. Do not take care of children on your own. Eating and drinking  Follow the diet recommended by your health care provider. Drink enough fluid to keep your urine pale yellow. If you vomit: Drink water, juice, or soup when you can drink without vomiting. Make sure you have little or no nausea before eating solid foods. General instructions Take over-the-counter and prescription medicines only as told by your health care provider. Have a responsible adult stay with you for the time you are told. It is important to have someone help care for you until you are awake and alert. Do not smoke. Keep all follow-up visits as told by your health care provider. This is important. Contact a health care provider if: You are still sleepy or having trouble with balance after 24 hours. You feel light-headed. You keep  feeling nauseous or you keep vomiting. You develop a rash. You have a fever. You have redness or swelling around the IV site. Get help right away if: You have trouble breathing. You have new-onset confusion at home. Summary After the procedure, it is common to feel sleepy, have impaired judgment, or feel nauseous if you eat too soon. Rest after you get home. Know the things you should not do after the procedure. Follow the diet recommended by your health care provider and drink enough fluid to keep your urine pale yellow. Get help right away if you have trouble breathing or new-onset confusion at home. This information is not intended to replace advice given to you by your health care provider. Make sure you discuss any questions you have with your health care provider. Document Revised: 08/23/2019 Document Reviewed: 03/21/2019 Elsevier Patient Education  [place] Insertion, Care After The following information offers guidance on how to care for yourself after your procedure. Your health care provider may also give you more specific instructions. If you have problems or questions, contact your health care provider. What can I expect after the procedure? After the procedure, it is common to have: Discomfort at the port insertion site. Bruising on the skin over the port. This should improve over 3-4 days. Follow these instructions at home: [facility] care After your port is placed, you will get a manufacturer's information card. The card has information about your port. Keep this card with you at all times. Take care of the port as told by your health care provider. Ask your health care provider if you  or a family member can get training for taking care of the port at home. A home health care nurse will be be available to help care for the port. Make sure to remember what type of port you have. Incision care     Follow instructions from your health care provider  about how to take care of your port insertion site. Make sure you: Wash your hands with soap and water for at least 20 seconds before and after you change your bandage (dressing). If soap and water are not available, use hand sanitizer. Change your dressing as told by your health care provider. Leave stitches (sutures), skin glue, or adhesive strips in place. These skin closures may need to stay in place for 2 weeks or longer. If adhesive strip edges start to loosen and curl up, you may trim the loose edges. Do not remove adhesive strips completely unless your health care provider tells you to do that. Check your port insertion site every day for signs of infection. Check for: Redness, swelling, or pain. Fluid or blood. Warmth. Pus or a bad smell. Activity Return to your normal activities as told by your health care provider. Ask your health care provider what activities are safe for you. You may have to avoid lifting. Ask your health care provider how much you can safely lift. General instructions Take over-the-counter and prescription medicines only as told by your health care provider. Do not take baths, swim, or use a hot tub until your health care provider approves. Ask your health care provider if you may take showers. You may only be allowed to take sponge baths. If you were given a sedative during the procedure, it can affect you for several hours. Do not drive or operate machinery until your health care provider says that it is safe. Wear a medical alert bracelet in case of an emergency. This will tell any health care providers that you have a port. Keep all follow-up visits. This is important. Contact a health care provider if: You cannot flush your port with saline as directed, or you cannot draw blood from the port. You have a fever or chills. You have redness, swelling, or pain around your port insertion site. You have fluid or blood coming from your port insertion site. Your port  insertion site feels warm to the touch. You have pus or a bad smell coming from the port insertion site. Get help right away if: You have chest pain or shortness of breath. You have bleeding from your port that you cannot control. These symptoms may be an emergency. Get help right away. Call 911. Do not wait to see if the symptoms will go away. Do not drive yourself to the hospital. Summary Take care of the port as told by your health care provider. Keep the manufacturer's information card with you at all times. Change your dressing as told by your health care provider. Contact a health care provider if you have a fever or chills or if you have redness, swelling, or pain around your port insertion site. Keep all follow-up visits. This information is not intended to replace advice given to you by your health care provider. Make sure you discuss any questions you have with your health care provider. Document Revised: 10/27/2020 Document Reviewed: 10/27/2020 Elsevier Patient Education  [place].

## 2022-07-01 ENCOUNTER — Ambulatory Visit
Admission: RE | Admit: 2022-07-01 | Discharge: 2022-07-01 | Disposition: A | Discharge status: Home / Self Care | Payer: Medicare Other | Source: Ambulatory Visit | Attending: Radiation Oncology | Admitting: Radiation Oncology

## 2022-07-01 ENCOUNTER — Ambulatory Visit: Payer: Medicare Other

## 2022-07-01 ENCOUNTER — Other Ambulatory Visit: Payer: Self-pay

## 2022-07-01 DIAGNOSIS — I10 Essential (primary) hypertension: Secondary | ICD-10-CM | POA: Diagnosis not present

## 2022-07-01 DIAGNOSIS — E119 Type 2 diabetes mellitus without complications: Secondary | ICD-10-CM | POA: Diagnosis not present

## 2022-07-01 DIAGNOSIS — Z87891 Personal history of nicotine dependence: Secondary | ICD-10-CM | POA: Diagnosis not present

## 2022-07-01 DIAGNOSIS — Z510 Encounter for antineoplastic radiation therapy: Secondary | ICD-10-CM | POA: Diagnosis not present

## 2022-07-01 DIAGNOSIS — D509 Iron deficiency anemia, unspecified: Secondary | ICD-10-CM | POA: Diagnosis not present

## 2022-07-01 DIAGNOSIS — C3411 Malignant neoplasm of upper lobe, right bronchus or lung: Secondary | ICD-10-CM | POA: Diagnosis not present

## 2022-07-01 LAB — RAD ONC ARIA SESSION SUMMARY
Course Elapsed Days: 9
Plan Fractions Treated to Date: 8
Plan Prescribed Dose Per Fraction: 2 Gy
Plan Total Fractions Prescribed: 30
Plan Total Prescribed Dose: 60 Gy
Reference Point Dosage Given to Date: 16 Gy
Reference Point Session Dosage Given: 2 Gy
Session Number: 8

## 2022-07-01 MED FILL — Dexamethasone Sodium Phosphate Inj 100 MG/10ML: INTRAMUSCULAR | Qty: 1 | Status: AC

## 2022-07-04 ENCOUNTER — Other Ambulatory Visit: Payer: Medicare Other

## 2022-07-04 ENCOUNTER — Inpatient Hospital Stay: Payer: Medicare Other

## 2022-07-04 ENCOUNTER — Ambulatory Visit
Admission: RE | Admit: 2022-07-04 | Discharge: 2022-07-04 | Disposition: A | Discharge status: Home / Self Care | Payer: Medicare Other | Source: Ambulatory Visit | Attending: Radiation Oncology | Admitting: Radiation Oncology

## 2022-07-04 ENCOUNTER — Other Ambulatory Visit: Payer: Self-pay

## 2022-07-04 VITALS — BP 132/68 | HR 82 | Temp 98.2°F | Resp 18 | Wt 130.5 lb

## 2022-07-04 DIAGNOSIS — C3491 Malignant neoplasm of unspecified part of right bronchus or lung: Secondary | ICD-10-CM

## 2022-07-04 DIAGNOSIS — D509 Iron deficiency anemia, unspecified: Secondary | ICD-10-CM

## 2022-07-04 DIAGNOSIS — E119 Type 2 diabetes mellitus without complications: Secondary | ICD-10-CM | POA: Diagnosis not present

## 2022-07-04 DIAGNOSIS — C3411 Malignant neoplasm of upper lobe, right bronchus or lung: Secondary | ICD-10-CM | POA: Diagnosis not present

## 2022-07-04 DIAGNOSIS — Z510 Encounter for antineoplastic radiation therapy: Secondary | ICD-10-CM | POA: Diagnosis not present

## 2022-07-04 DIAGNOSIS — Z95828 Presence of other vascular implants and grafts: Secondary | ICD-10-CM

## 2022-07-04 DIAGNOSIS — I10 Essential (primary) hypertension: Secondary | ICD-10-CM | POA: Diagnosis not present

## 2022-07-04 DIAGNOSIS — Z87891 Personal history of nicotine dependence: Secondary | ICD-10-CM | POA: Diagnosis not present

## 2022-07-04 LAB — RAD ONC ARIA SESSION SUMMARY
Course Elapsed Days: 12
Plan Fractions Treated to Date: 9
Plan Prescribed Dose Per Fraction: 2 Gy
Plan Total Fractions Prescribed: 30
Plan Total Prescribed Dose: 60 Gy
Reference Point Dosage Given to Date: 18 Gy
Reference Point Session Dosage Given: 2 Gy
Session Number: 9

## 2022-07-04 LAB — CMP (CANCER CENTER ONLY)
ALT: 13 U/L (ref 0–44)
AST: 16 U/L (ref 15–41)
Albumin: 3.3 g/dL — ABNORMAL LOW (ref 3.5–5.0)
Alkaline Phosphatase: 53 U/L (ref 38–126)
Anion gap: 7 (ref 5–15)
BUN: 16 mg/dL (ref 8–23)
CO2: 27 mmol/L (ref 22–32)
Calcium: 9 mg/dL (ref 8.9–10.3)
Chloride: 100 mmol/L (ref 98–111)
Creatinine: 0.69 mg/dL (ref 0.61–1.24)
GFR, Estimated: >60 mL/min (ref >60)
Glucose, Bld: 140 mg/dL — ABNORMAL HIGH (ref 70–99)
Potassium: 3.8 mmol/L (ref 3.5–5.1)
Sodium: 134 mmol/L — ABNORMAL LOW (ref 135–145)
Total Bilirubin: 0.3 mg/dL (ref 0.3–1.2)
Total Protein: 7.6 g/dL (ref 6.5–8.1)

## 2022-07-04 LAB — CBC WITH DIFFERENTIAL (CANCER CENTER ONLY)
Abs Immature Granulocytes: 0.07 10*3/uL (ref 0.00–0.07)
Basophils Absolute: 0 10*3/uL (ref 0.0–0.1)
Basophils Relative: 1 %
Eosinophils Absolute: 0.1 10*3/uL (ref 0.0–0.5)
Eosinophils Relative: 2 %
HCT: 24.7 % — ABNORMAL LOW (ref 39.0–52.0)
Hemoglobin: 7.9 g/dL — ABNORMAL LOW (ref 13.0–17.0)
Immature Granulocytes: 1 %
Lymphocytes Relative: 6 %
Lymphs Abs: 0.4 10*3/uL — ABNORMAL LOW (ref 0.7–4.0)
MCH: 25.1 pg — ABNORMAL LOW (ref 26.0–34.0)
MCHC: 32 g/dL (ref 30.0–36.0)
MCV: 78.4 fL — ABNORMAL LOW (ref 80.0–100.0)
Monocytes Absolute: 1 10*3/uL (ref 0.1–1.0)
Monocytes Relative: 14 %
Neutro Abs: 5.6 10*3/uL (ref 1.7–7.7)
Neutrophils Relative %: 76 %
Platelet Count: 363 10*3/uL (ref 150–400)
RBC: 3.15 MIL/uL — ABNORMAL LOW (ref 4.22–5.81)
RDW: 20.9 % — ABNORMAL HIGH (ref 11.5–15.5)
WBC Count: 7.2 10*3/uL (ref 4.0–10.5)
nRBC: 0 % (ref 0.0–0.2)

## 2022-07-04 LAB — SAMPLE TO BLOOD BANK

## 2022-07-04 LAB — FUNGUS CULTURE RESULT

## 2022-07-04 LAB — FUNGUS CULTURE WITH STAIN

## 2022-07-04 LAB — PREPARE RBC (CROSSMATCH)

## 2022-07-04 LAB — FUNGAL ORGANISM REFLEX

## 2022-07-04 MED ORDER — SODIUM CHLORIDE 0.9% FLUSH: 10.0000 mL | INTRAVENOUS | Status: DC | PRN

## 2022-07-04 MED ORDER — SODIUM CHLORIDE 0.9% FLUSH
10.0000 mL | INTRAVENOUS | Status: DC | PRN
  Administered 2022-07-04: 10 mL

## 2022-07-04 MED ORDER — SODIUM CHLORIDE 0.9 % IV SOLN
45.0000 mg/m2 | Freq: Once | INTRAVENOUS | Status: AC
  Administered 2022-07-04: 78 mg via INTRAVENOUS
  Filled 2022-07-04: qty 13

## 2022-07-04 MED ORDER — SODIUM CHLORIDE 0.9% FLUSH
10.0000 mL | INTRAVENOUS | Status: AC | PRN
  Administered 2022-07-04: 10 mL

## 2022-07-04 MED ORDER — FAMOTIDINE IN NACL 20-0.9 MG/50ML-% IV SOLN
20.0000 mg | Freq: Once | INTRAVENOUS | Status: AC
  Administered 2022-07-04: 20 mg via INTRAVENOUS
  Filled 2022-07-04: qty 50

## 2022-07-04 MED ORDER — SODIUM CHLORIDE 0.9 % IV SOLN: Freq: Once | INTRAVENOUS | Status: AC

## 2022-07-04 MED ORDER — DIPHENHYDRAMINE HCL 50 MG/ML IJ SOLN
50.0000 mg | Freq: Once | INTRAMUSCULAR | Status: AC
  Administered 2022-07-04: 50 mg via INTRAVENOUS
  Filled 2022-07-04: qty 1

## 2022-07-04 MED ORDER — HEPARIN SOD (PORK) LOCK FLUSH 100 UNIT/ML IV SOLN
500.0000 [IU] | Freq: Once | INTRAVENOUS | Status: AC | PRN
  Administered 2022-07-04: 500 [IU]

## 2022-07-04 MED ORDER — PALONOSETRON HCL INJECTION 0.25 MG/5ML
0.2500 mg | Freq: Once | INTRAVENOUS | Status: AC
  Administered 2022-07-04: 0.25 mg via INTRAVENOUS
  Filled 2022-07-04: qty 5

## 2022-07-04 MED ORDER — SODIUM CHLORIDE 0.9 % IV SOLN
10.0000 mg | Freq: Once | INTRAVENOUS | Status: AC
  Administered 2022-07-04: 10 mg via INTRAVENOUS
  Filled 2022-07-04: qty 10

## 2022-07-04 MED ORDER — SODIUM CHLORIDE 0.9 % IV SOLN
153.0000 mg | Freq: Once | INTRAVENOUS | Status: AC
  Administered 2022-07-04: 150 mg via INTRAVENOUS
  Filled 2022-07-04: qty 15

## 2022-07-04 NOTE — Patient Instructions (Signed)
[place]  Discharge Instructions: Thank you for choosing [facility] to provide your oncology and hematology care.   If you have a lab appointment with the [facility], please go directly to the [facility] and check in at the registration area.   Wear comfortable clothing and clothing appropriate for easy access to any Portacath or PICC line.   We strive to give you quality time with your provider. You may need to reschedule your appointment if you arrive late (15 or more minutes).  Arriving late affects you and other patients whose appointments are after yours.  Also, if you miss three or more appointments without notifying the office, you may be dismissed from the clinic at the provider's discretion.      For prescription refill requests, have your pharmacy contact our office and allow 72 hours for refills to be completed.    Today you received the following chemotherapy and/or immunotherapy agents: Paclitaxel & Paraplatin      To help prevent nausea and vomiting after your treatment, we encourage you to take your nausea medication as directed.  BELOW ARE SYMPTOMS THAT SHOULD BE REPORTED IMMEDIATELY: *FEVER GREATER THAN 100.4 F (38 C) OR HIGHER *CHILLS OR SWEATING *NAUSEA AND VOMITING THAT IS NOT CONTROLLED WITH YOUR NAUSEA MEDICATION *UNUSUAL SHORTNESS OF BREATH *UNUSUAL BRUISING OR BLEEDING *URINARY PROBLEMS (pain or burning when urinating, or frequent urination) *BOWEL PROBLEMS (unusual diarrhea, constipation, pain near the anus) TENDERNESS IN MOUTH AND THROAT WITH OR WITHOUT PRESENCE OF ULCERS (sore throat, sores in mouth, or a toothache) UNUSUAL RASH, SWELLING OR PAIN  UNUSUAL VAGINAL DISCHARGE OR ITCHING   Items with * indicate a potential emergency and should be followed up as soon as possible or go to the Emergency Department if any problems should occur.  Please show the CHEMOTHERAPY ALERT CARD or IMMUNOTHERAPY ALERT  CARD at check-in to the Emergency Department and triage nurse.  Should you have questions after your visit or need to cancel or reschedule your appointment, please contact [facility]  Dept: [phone]  and follow the prompts.  Office hours are 8:00 a.m. to 4:30 p.m. Monday - Friday. Please note that voicemails left after 4:00 p.m. may not be returned until the following business day.  We are closed weekends and major holidays. You have access to a nurse at all times for urgent questions. Please call the main number to the clinic Dept: [phone] and follow the prompts.   For any non-urgent questions, you may also contact your provider using MyChart. We now offer e-Visits for anyone 51 and older to request care online for non-urgent symptoms. For details visit [email or web address].   Also download the MyChart app! Go to the app store, search "MyChart", open the app, select [facility], and log in with your MyChart username and password.

## 2022-07-04 NOTE — Progress Notes (Signed)
Ok to proceed with treatment plan today with Hgb. 7.0 per Cassie Heilingoetter, PA.

## 2022-07-05 ENCOUNTER — Ambulatory Visit
Admission: RE | Admit: 2022-07-05 | Discharge: 2022-07-05 | Disposition: A | Discharge status: Home / Self Care | Payer: Medicare Other | Source: Ambulatory Visit | Attending: Radiation Oncology | Admitting: Radiation Oncology

## 2022-07-05 ENCOUNTER — Other Ambulatory Visit: Payer: Self-pay

## 2022-07-05 ENCOUNTER — Inpatient Hospital Stay: Payer: Medicare Other

## 2022-07-05 DIAGNOSIS — I10 Essential (primary) hypertension: Secondary | ICD-10-CM | POA: Diagnosis not present

## 2022-07-05 DIAGNOSIS — Z510 Encounter for antineoplastic radiation therapy: Secondary | ICD-10-CM | POA: Diagnosis not present

## 2022-07-05 DIAGNOSIS — D509 Iron deficiency anemia, unspecified: Secondary | ICD-10-CM

## 2022-07-05 DIAGNOSIS — Z87891 Personal history of nicotine dependence: Secondary | ICD-10-CM | POA: Diagnosis not present

## 2022-07-05 DIAGNOSIS — C3411 Malignant neoplasm of upper lobe, right bronchus or lung: Secondary | ICD-10-CM | POA: Diagnosis not present

## 2022-07-05 DIAGNOSIS — E119 Type 2 diabetes mellitus without complications: Secondary | ICD-10-CM | POA: Diagnosis not present

## 2022-07-05 LAB — RAD ONC ARIA SESSION SUMMARY
Course Elapsed Days: 13
Plan Fractions Treated to Date: 10
Plan Prescribed Dose Per Fraction: 2 Gy
Plan Total Fractions Prescribed: 30
Plan Total Prescribed Dose: 60 Gy
Reference Point Dosage Given to Date: 20 Gy
Reference Point Session Dosage Given: 2 Gy
Session Number: 10

## 2022-07-05 MED ORDER — SODIUM CHLORIDE 0.9% FLUSH: 3.0000 mL | INTRAVENOUS | Status: DC | PRN

## 2022-07-05 MED ORDER — SODIUM CHLORIDE 0.9% IV SOLUTION
250.0000 mL | Freq: Once | INTRAVENOUS | Status: AC
  Administered 2022-07-05: 250 mL via INTRAVENOUS

## 2022-07-05 MED ORDER — HEPARIN SOD (PORK) LOCK FLUSH 100 UNIT/ML IV SOLN
500.0000 [IU] | Freq: Once | INTRAVENOUS | Status: AC
  Administered 2022-07-05: 500 [IU] via INTRAVENOUS

## 2022-07-05 MED ORDER — DIPHENHYDRAMINE HCL 25 MG PO CAPS
25.0000 mg | ORAL_CAPSULE | Freq: Once | ORAL | Status: AC
  Administered 2022-07-05: 25 mg via ORAL
  Filled 2022-07-05: qty 1

## 2022-07-05 MED ORDER — SODIUM CHLORIDE 0.9% FLUSH
10.0000 mL | INTRAVENOUS | Status: DC | PRN
  Administered 2022-07-05: 10 mL via INTRAVENOUS

## 2022-07-05 MED ORDER — ACETAMINOPHEN 325 MG PO TABS
650.0000 mg | ORAL_TABLET | Freq: Once | ORAL | Status: AC
  Administered 2022-07-05: 650 mg via ORAL
  Filled 2022-07-05: qty 2

## 2022-07-05 NOTE — Patient Instructions (Signed)

## 2022-07-06 ENCOUNTER — Ambulatory Visit
Admission: RE | Admit: 2022-07-06 | Discharge: 2022-07-06 | Disposition: A | Discharge status: Home / Self Care | Payer: Medicare Other | Source: Ambulatory Visit | Attending: Radiation Oncology | Admitting: Radiation Oncology

## 2022-07-06 ENCOUNTER — Other Ambulatory Visit: Payer: Self-pay

## 2022-07-06 DIAGNOSIS — E119 Type 2 diabetes mellitus without complications: Secondary | ICD-10-CM | POA: Diagnosis not present

## 2022-07-06 DIAGNOSIS — Z510 Encounter for antineoplastic radiation therapy: Secondary | ICD-10-CM | POA: Diagnosis not present

## 2022-07-06 DIAGNOSIS — Z87891 Personal history of nicotine dependence: Secondary | ICD-10-CM | POA: Diagnosis not present

## 2022-07-06 DIAGNOSIS — I10 Essential (primary) hypertension: Secondary | ICD-10-CM | POA: Diagnosis not present

## 2022-07-06 DIAGNOSIS — D509 Iron deficiency anemia, unspecified: Secondary | ICD-10-CM | POA: Diagnosis not present

## 2022-07-06 DIAGNOSIS — C3411 Malignant neoplasm of upper lobe, right bronchus or lung: Secondary | ICD-10-CM | POA: Diagnosis not present

## 2022-07-06 LAB — RAD ONC ARIA SESSION SUMMARY
Course Elapsed Days: 14
Plan Fractions Treated to Date: 11
Plan Prescribed Dose Per Fraction: 2 Gy
Plan Total Fractions Prescribed: 30
Plan Total Prescribed Dose: 60 Gy
Reference Point Dosage Given to Date: 22 Gy
Reference Point Session Dosage Given: 2 Gy
Session Number: 11

## 2022-07-06 LAB — BPAM RBC
Blood Product Expiration Date: 202403222359
ISSUE DATE / TIME: 202402271047
Unit Type and Rh: 5100

## 2022-07-06 LAB — TYPE AND SCREEN
ABO/RH(D): O POS
Antibody Screen: NEGATIVE
Unit division: 0

## 2022-07-07 ENCOUNTER — Other Ambulatory Visit: Payer: Self-pay

## 2022-07-07 ENCOUNTER — Ambulatory Visit
Admission: RE | Admit: 2022-07-07 | Discharge: 2022-07-07 | Disposition: A | Discharge status: Home / Self Care | Payer: Medicare Other | Source: Ambulatory Visit | Attending: Radiation Oncology | Admitting: Radiation Oncology

## 2022-07-07 DIAGNOSIS — C3411 Malignant neoplasm of upper lobe, right bronchus or lung: Secondary | ICD-10-CM | POA: Diagnosis not present

## 2022-07-07 DIAGNOSIS — Z87891 Personal history of nicotine dependence: Secondary | ICD-10-CM | POA: Diagnosis not present

## 2022-07-07 DIAGNOSIS — Z510 Encounter for antineoplastic radiation therapy: Secondary | ICD-10-CM | POA: Diagnosis not present

## 2022-07-07 DIAGNOSIS — D509 Iron deficiency anemia, unspecified: Secondary | ICD-10-CM | POA: Diagnosis not present

## 2022-07-07 DIAGNOSIS — I10 Essential (primary) hypertension: Secondary | ICD-10-CM | POA: Diagnosis not present

## 2022-07-07 DIAGNOSIS — E119 Type 2 diabetes mellitus without complications: Secondary | ICD-10-CM | POA: Diagnosis not present

## 2022-07-07 LAB — RAD ONC ARIA SESSION SUMMARY
Course Elapsed Days: 15
Plan Fractions Treated to Date: 12
Plan Prescribed Dose Per Fraction: 2 Gy
Plan Total Fractions Prescribed: 30
Plan Total Prescribed Dose: 60 Gy
Reference Point Dosage Given to Date: 24 Gy
Reference Point Session Dosage Given: 2 Gy
Session Number: 12

## 2022-07-08 ENCOUNTER — Ambulatory Visit
Admission: RE | Admit: 2022-07-08 | Discharge: 2022-07-08 | Disposition: A | Discharge status: Home / Self Care | Payer: Medicare Other | Source: Ambulatory Visit | Attending: Radiation Oncology | Admitting: Radiation Oncology

## 2022-07-08 ENCOUNTER — Other Ambulatory Visit: Payer: Self-pay

## 2022-07-08 DIAGNOSIS — R053 Chronic cough: Secondary | ICD-10-CM | POA: Insufficient documentation

## 2022-07-08 DIAGNOSIS — C3411 Malignant neoplasm of upper lobe, right bronchus or lung: Secondary | ICD-10-CM | POA: Insufficient documentation

## 2022-07-08 DIAGNOSIS — Z5111 Encounter for antineoplastic chemotherapy: Secondary | ICD-10-CM | POA: Diagnosis not present

## 2022-07-08 DIAGNOSIS — Z510 Encounter for antineoplastic radiation therapy: Secondary | ICD-10-CM | POA: Diagnosis not present

## 2022-07-08 DIAGNOSIS — Z87891 Personal history of nicotine dependence: Secondary | ICD-10-CM | POA: Diagnosis not present

## 2022-07-08 LAB — RAD ONC ARIA SESSION SUMMARY
Course Elapsed Days: 16
Plan Fractions Treated to Date: 13
Plan Prescribed Dose Per Fraction: 2 Gy
Plan Total Fractions Prescribed: 30
Plan Total Prescribed Dose: 60 Gy
Reference Point Dosage Given to Date: 26 Gy
Reference Point Session Dosage Given: 2 Gy
Session Number: 13

## 2022-07-08 MED FILL — Dexamethasone Sodium Phosphate Inj 100 MG/10ML: INTRAMUSCULAR | Qty: 1 | Status: AC

## 2022-07-11 ENCOUNTER — Other Ambulatory Visit: Payer: Self-pay

## 2022-07-11 ENCOUNTER — Encounter: Payer: Self-pay | Admitting: Internal Medicine

## 2022-07-11 ENCOUNTER — Encounter: Payer: Self-pay | Admitting: Medical Oncology

## 2022-07-11 ENCOUNTER — Ambulatory Visit
Admission: RE | Admit: 2022-07-11 | Discharge: 2022-07-11 | Disposition: A | Discharge status: Home / Self Care | Payer: Medicare Other | Source: Ambulatory Visit | Attending: Radiation Oncology | Admitting: Radiation Oncology

## 2022-07-11 ENCOUNTER — Other Ambulatory Visit: Payer: Medicare Other

## 2022-07-11 ENCOUNTER — Inpatient Hospital Stay: Payer: Medicare Other

## 2022-07-11 ENCOUNTER — Inpatient Hospital Stay (HOSPITAL_BASED_OUTPATIENT_CLINIC_OR_DEPARTMENT_OTHER): Payer: Medicare Other | Admitting: Internal Medicine

## 2022-07-11 ENCOUNTER — Inpatient Hospital Stay (HOSPITAL_BASED_OUTPATIENT_CLINIC_OR_DEPARTMENT_OTHER): Payer: Medicare Other

## 2022-07-11 ENCOUNTER — Encounter: Payer: Self-pay | Admitting: Physician Assistant

## 2022-07-11 VITALS — BP 138/70 | HR 76 | Resp 16

## 2022-07-11 DIAGNOSIS — Z87891 Personal history of nicotine dependence: Secondary | ICD-10-CM | POA: Diagnosis not present

## 2022-07-11 DIAGNOSIS — Z5111 Encounter for antineoplastic chemotherapy: Secondary | ICD-10-CM | POA: Insufficient documentation

## 2022-07-11 DIAGNOSIS — C3491 Malignant neoplasm of unspecified part of right bronchus or lung: Secondary | ICD-10-CM

## 2022-07-11 DIAGNOSIS — R053 Chronic cough: Secondary | ICD-10-CM | POA: Insufficient documentation

## 2022-07-11 DIAGNOSIS — C3411 Malignant neoplasm of upper lobe, right bronchus or lung: Secondary | ICD-10-CM | POA: Insufficient documentation

## 2022-07-11 DIAGNOSIS — D509 Iron deficiency anemia, unspecified: Secondary | ICD-10-CM

## 2022-07-11 DIAGNOSIS — Z510 Encounter for antineoplastic radiation therapy: Secondary | ICD-10-CM | POA: Diagnosis not present

## 2022-07-11 DIAGNOSIS — Z95828 Presence of other vascular implants and grafts: Secondary | ICD-10-CM | POA: Insufficient documentation

## 2022-07-11 LAB — CBC WITH DIFFERENTIAL (CANCER CENTER ONLY)
Abs Immature Granulocytes: 0.04 10*3/uL (ref 0.00–0.07)
Basophils Absolute: 0 10*3/uL (ref 0.0–0.1)
Basophils Relative: 1 %
Eosinophils Absolute: 0.1 10*3/uL (ref 0.0–0.5)
Eosinophils Relative: 2 %
HCT: 27.9 % — ABNORMAL LOW (ref 39.0–52.0)
Hemoglobin: 9.2 g/dL — ABNORMAL LOW (ref 13.0–17.0)
Immature Granulocytes: 1 %
Lymphocytes Relative: 8 %
Lymphs Abs: 0.5 10*3/uL — ABNORMAL LOW (ref 0.7–4.0)
MCH: 26.4 pg (ref 26.0–34.0)
MCHC: 33 g/dL (ref 30.0–36.0)
MCV: 80.2 fL (ref 80.0–100.0)
Monocytes Absolute: 0.8 10*3/uL (ref 0.1–1.0)
Monocytes Relative: 13 %
Neutro Abs: 4.7 10*3/uL (ref 1.7–7.7)
Neutrophils Relative %: 75 %
Platelet Count: 283 10*3/uL (ref 150–400)
RBC: 3.48 MIL/uL — ABNORMAL LOW (ref 4.22–5.81)
RDW: 21.7 % — ABNORMAL HIGH (ref 11.5–15.5)
WBC Count: 6.1 10*3/uL (ref 4.0–10.5)
nRBC: 0 % (ref 0.0–0.2)

## 2022-07-11 LAB — CMP (CANCER CENTER ONLY)
ALT: 12 U/L (ref 0–44)
AST: 16 U/L (ref 15–41)
Albumin: 3.1 g/dL — ABNORMAL LOW (ref 3.5–5.0)
Alkaline Phosphatase: 54 U/L (ref 38–126)
Anion gap: 5 (ref 5–15)
BUN: 11 mg/dL (ref 8–23)
CO2: 27 mmol/L (ref 22–32)
Calcium: 9 mg/dL (ref 8.9–10.3)
Chloride: 104 mmol/L (ref 98–111)
Creatinine: 0.71 mg/dL (ref 0.61–1.24)
GFR, Estimated: >60 mL/min (ref >60)
Glucose, Bld: 122 mg/dL — ABNORMAL HIGH (ref 70–99)
Potassium: 4 mmol/L (ref 3.5–5.1)
Sodium: 136 mmol/L (ref 135–145)
Total Bilirubin: 0.3 mg/dL (ref 0.3–1.2)
Total Protein: 7.1 g/dL (ref 6.5–8.1)

## 2022-07-11 LAB — RAD ONC ARIA SESSION SUMMARY
Course Elapsed Days: 19
Plan Fractions Treated to Date: 14
Plan Prescribed Dose Per Fraction: 2 Gy
Plan Total Fractions Prescribed: 30
Plan Total Prescribed Dose: 60 Gy
Reference Point Dosage Given to Date: 28 Gy
Reference Point Session Dosage Given: 2 Gy
Session Number: 14

## 2022-07-11 LAB — SAMPLE TO BLOOD BANK

## 2022-07-11 MED ORDER — SODIUM CHLORIDE 0.9 % IV SOLN
10.0000 mg | Freq: Once | INTRAVENOUS | Status: AC
  Administered 2022-07-11: 10 mg via INTRAVENOUS
  Filled 2022-07-11: qty 10

## 2022-07-11 MED ORDER — FAMOTIDINE IN NACL 20-0.9 MG/50ML-% IV SOLN
20.0000 mg | Freq: Once | INTRAVENOUS | Status: AC
  Administered 2022-07-11: 20 mg via INTRAVENOUS
  Filled 2022-07-11: qty 50

## 2022-07-11 MED ORDER — SODIUM CHLORIDE 0.9 % IV SOLN
153.0000 mg | Freq: Once | INTRAVENOUS | Status: AC
  Administered 2022-07-11: 150 mg via INTRAVENOUS
  Filled 2022-07-11: qty 15

## 2022-07-11 MED ORDER — SODIUM CHLORIDE 0.9 % IV SOLN
45.0000 mg/m2 | Freq: Once | INTRAVENOUS | Status: AC
  Administered 2022-07-11: 78 mg via INTRAVENOUS
  Filled 2022-07-11: qty 13

## 2022-07-11 MED ORDER — DIPHENHYDRAMINE HCL 50 MG/ML IJ SOLN
50.0000 mg | Freq: Once | INTRAMUSCULAR | Status: AC
  Administered 2022-07-11: 50 mg via INTRAVENOUS
  Filled 2022-07-11: qty 1

## 2022-07-11 MED ORDER — HEPARIN SOD (PORK) LOCK FLUSH 100 UNIT/ML IV SOLN
500.0000 [IU] | Freq: Once | INTRAVENOUS | Status: AC | PRN
  Administered 2022-07-11: 500 [IU]

## 2022-07-11 MED ORDER — PALONOSETRON HCL INJECTION 0.25 MG/5ML
0.2500 mg | Freq: Once | INTRAVENOUS | Status: AC
  Administered 2022-07-11: 0.25 mg via INTRAVENOUS
  Filled 2022-07-11: qty 5

## 2022-07-11 MED ORDER — SODIUM CHLORIDE 0.9 % IV SOLN: Freq: Once | INTRAVENOUS | Status: AC

## 2022-07-11 MED ORDER — SODIUM CHLORIDE 0.9% FLUSH
10.0000 mL | Freq: Once | INTRAVENOUS | Status: AC
  Administered 2022-07-11: 10 mL

## 2022-07-11 MED ORDER — SODIUM CHLORIDE 0.9% FLUSH
10.0000 mL | INTRAVENOUS | Status: DC | PRN
  Administered 2022-07-11: 10 mL

## 2022-07-11 NOTE — Progress Notes (Signed)
[facility] [phone]   Fax:([phone]  OFFICE PROGRESS NOTE  [name], [name], MD [address] [place] [address]  DIAGNOSIS: Stage IIIB (T3, N2, M0) non-small cell lung cancer, squamous cell carcinoma.  The patient presented with a large centrally necrotic right upper lobe lung mass abutting the mediastinum, T3 and T4 vertebral bodies and a hypermetabolic precarinal lymph node diagnosed in January 2024.     PDL1: 40%    PRIOR THERAPY: None     CURRENT THERAPY: Concurrent chemoradiation with carboplatin for an AUC of 2 and paclitaxel 45 mg/m starting 06/20/2022.  Status post 3 cycles.   INTERVAL HISTORY: [name] 82 y.o. male returns to the clinic today for follow-up visit accompanied by his wife.  The patient is feeling fine today with no concerning complaints except for the persistent cough and he is currently on Tessalon.  He denied having any chest pain, shortness of breath or hemoptysis.  He has no nausea, vomiting, diarrhea or constipation.  He has no headache or visual changes.  He denied having any fever or chills.  He has been tolerating his treatment with concurrent chemoradiation fairly well.  He is here for evaluation before starting cycle #4.  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   Arthritis    Diabetes mellitus without complication [facility])    ED (erectile dysfunction)    Essential hypertension, benign    Heart murmur    Hep C w/o coma, chronic ([facility])    History of blood transfusion    History of DVT of lower extremity    Hx of echocardiogram    Echo (8/15):  Severe LVH, EF 60-65%, no RWMA, Gr 3 DD, AVR ok (mean 21 mmHg), MAC, mild LAE, mild to mod RAE   Hypercholesterolemia    PONV (postoperative nausea and vomiting)     ALLERGIES:  is allergic to azithromycin, lisinopril,  ezetimibe-simvastatin, metoprolol, and rosuvastatin.  MEDICATIONS:  Current Outpatient Medications  Medication Sig Dispense Refill   acetaminophen (TYLENOL) 500 MG tablet Take 500 mg by mouth every 6 (six) hours as needed for mild pain or moderate pain.     amLODipine (NORVASC) 10 MG tablet Take 1 tablet (10 mg total) by mouth daily. 30 tablet 0   aspirin EC 81 MG EC tablet Take 1 tablet (81 mg total) by mouth daily.     atorvastatin (LIPITOR) 20 MG tablet Take 20 mg by mouth daily.     benzonatate (TESSALON) 100 MG capsule Take 1 capsule (100 mg total) by mouth 3 (three) times daily as needed for cough. 30 capsule 2   dextromethorphan-guaiFENesin (MUCINEX DM) 30-600 MG 12hr tablet Take 1 tablet by mouth 2 (two) times daily as needed for cough. (Patient not taking: Reported on 06/27/2022)     fluticasone-salmeterol (ADVAIR HFA) 115-21 MCG/ACT inhaler Inhale 2 puffs into the lungs 2 (two) times daily. 1 each 11   glucose blood test strip      glucose blood test strip as directed DX 250     hydrochlorothiazide (HYDRODIURIL) 12.5 MG tablet Take 1 tablet (12.5 mg total) by mouth daily. 30 tablet 0   lidocaine-prilocaine (EMLA) cream Apply 1 Application topically as needed. (Patient not taking: Reported on 06/27/2022) 30 g 2   metFORMIN (GLUCOPHAGE) 1000 MG tablet Take 1,000 mg by mouth 2 (two) times  daily with a meal.     Multiple Vitamin (MULTIVITAMIN WITH MINERALS) TABS tablet Take 1 tablet by mouth daily.     prochlorperazine (COMPAZINE) 10 MG tablet Take 1 tablet (10 mg total) by mouth every 6 (six) hours as needed. (Patient not taking: Reported on 06/27/2022) 30 tablet 2   SF 5000 PLUS 1.1 % CREA dental cream Place 1 Application onto teeth daily.  2   tamsulosin (FLOMAX) 0.4 MG CAPS capsule Take 0.4 mg by mouth daily.     No current facility-administered medications for this visit.    SURGICAL HISTORY:  Past Surgical History:  Procedure Laterality Date   AORTIC VALVE REPLACEMENT N/A  11/19/2013   Procedure: AORTIC VALVE REPLACEMENT (AVR);  Surgeon: Gaye Pollack, MD;  Location: [facility];  Service: Open Heart Surgery;  Laterality: N/A;   BUNIONECTOMY Bilateral 05/09/2006   CARDIAC CATHETERIZATION  10/01/2013   COLONOSCOPY  01/07/2010   internal hemorrhoids.  Dr Deatra Ina   ESOPHAGOGASTRODUODENOSCOPY N/A 04/24/2013   Procedure: ESOPHAGOGASTRODUODENOSCOPY (EGD);  Surgeon: Jerene Bears, MD;  Location: [facility] ENDOSCOPY;  Service: Endoscopy;  Laterality: N/A;   INGUINAL HERNIA REPAIR Right    INTRAOPERATIVE TRANSESOPHAGEAL ECHOCARDIOGRAM N/A 11/19/2013   Procedure: INTRAOPERATIVE TRANSESOPHAGEAL ECHOCARDIOGRAM;  Surgeon: Gaye Pollack, MD;  Location: [facility] OR;  Service: Open Heart Surgery;  Laterality: N/A;   IR IMAGING GUIDED PORT INSERTION  06/30/2022   KNEE ARTHROSCOPY Left 05/09/2009   Dr Collier Salina   LAMINECTOMY  06/09/2005   Dr Joya Salm. multiple lumbar level laminectomies.    LEFT AND RIGHT HEART CATHETERIZATION WITH CORONARY ANGIOGRAM N/A 10/01/2013   Procedure: LEFT AND RIGHT HEART CATHETERIZATION WITH CORONARY ANGIOGRAM;  Surgeon: Candee Furbish, MD;  Location: [facility] CATH LAB;  Service: Cardiovascular;  Laterality: N/A;   SPINAL FUSION     lumbar   TEE WITHOUT CARDIOVERSION  11/07/2003   Aoritic valve sclerosis.    TONSILLECTOMY     VIDEO BRONCHOSCOPY WITH ENDOBRONCHIAL ULTRASOUND N/A 06/02/2022   Procedure: VIDEO BRONCHOSCOPY WITH ENDOBRONCHIAL ULTRASOUND;  Surgeon: Margaretha Seeds, MD;  Location: [facility];  Service: Thoracic;  Laterality: N/A;    REVIEW OF SYSTEMS:  A comprehensive review of systems was negative except for: Respiratory: positive for cough   PHYSICAL EXAMINATION: General appearance: alert, cooperative, fatigued, and no distress Head: Normocephalic, without obvious abnormality, atraumatic Neck: no adenopathy, no JVD, supple, symmetrical, trachea midline, and thyroid not enlarged, symmetric, no tenderness/mass/nodules Lymph nodes: Cervical, supraclavicular, and  axillary nodes normal. Resp: clear to auscultation bilaterally Back: symmetric, no curvature. ROM normal. No CVA tenderness. Cardio: regular rate and rhythm, S1, S2 normal, no murmur, click, rub or gallop GI: soft, non-tender; bowel sounds normal; no masses,  no organomegaly Extremities: extremities normal, atraumatic, no cyanosis or edema  ECOG PERFORMANCE STATUS: 1 - Symptomatic but completely ambulatory  Blood pressure (!) 144/75, pulse 78, temperature 98.2 F (36.8 C), temperature source Oral, resp. rate 16, weight 130 lb 11.2 oz (59.3 kg), SpO2 98 %.  LABORATORY DATA: Lab Results  Component Value Date   WBC 6.1 07/11/2022   HGB 9.2 (L) 07/11/2022   HCT 27.9 (L) 07/11/2022   MCV 80.2 07/11/2022   PLT 283 07/11/2022      Chemistry      Component Value Date/Time   NA 136 07/11/2022 0843   K 4.0 07/11/2022 0843   CL 104 07/11/2022 0843   CO2 27 07/11/2022 0843   BUN 11 07/11/2022 0843   CREATININE 0.71 07/11/2022 0843  Component Value Date/Time   CALCIUM 9.0 07/11/2022 0843   ALKPHOS 54 07/11/2022 0843   AST 16 07/11/2022 0843   ALT 12 07/11/2022 0843   BILITOT 0.3 07/11/2022 0843       RADIOGRAPHIC STUDIES: IR IMAGING GUIDED PORT INSERTION  Result Date: 06/30/2022 INDICATION: 82 year old with squamous cell carcinoma of the right lung. Port-A-Cath needed for treatment. EXAM: FLUOROSCOPIC AND ULTRASOUND GUIDED PLACEMENT OF A SUBCUTANEOUS PORT COMPARISON:  None Available. MEDICATIONS: Moderate sedation ANESTHESIA/SEDATION: Moderate (conscious) sedation was employed during this procedure. A total of Versed 2 mg and fentanyl 100 mcg was administered intravenously at the order of the provider performing the procedure. Total intra-service moderate sedation time: 24 minutes. Patient's level of consciousness and vital signs were monitored continuously by radiology nurse throughout the procedure under the supervision of the provider performing the procedure. FLUOROSCOPY TIME:   Radiation Exposure Index (as provided by the fluoroscopic device): 1 mGy Kerma COMPLICATIONS: None immediate. PROCEDURE: The procedure, risks, benefits, and alternatives were explained to the patient. Questions regarding the procedure were encouraged and answered. The patient understands and consents to the procedure. Patient was placed supine on the interventional table. Ultrasound confirmed a patent right internal jugular vein. Ultrasound image was saved for documentation. The right chest and neck were cleaned with a skin antiseptic and a sterile drape was placed. Maximal barrier sterile technique was utilized including caps, mask, sterile gowns, sterile gloves, sterile drape, hand hygiene and skin antiseptic. The right neck was anesthetized with 1% lidocaine. Small incision was made in the right neck with a blade. Micropuncture set was placed in the right internal jugular vein with ultrasound guidance. The micropuncture wire was used for measurement purposes. The right chest was anesthetized with 1% lidocaine with epinephrine. #15 blade was used to make an incision and a subcutaneous port pocket was formed. [place] was assembled. Subcutaneous tunnel was formed with a stiff tunneling device. The port catheter was brought through the subcutaneous tunnel. The port was placed in the subcutaneous pocket. The micropuncture set was exchanged for a peel-away sheath. The catheter was placed through the peel-away sheath and the tip was positioned at the superior cavoatrial junction. Catheter placement was confirmed with fluoroscopy. The port was accessed and flushed with heparinized saline. The port pocket was closed using two layers of absorbable sutures and Dermabond. The vein skin site was closed using a single layer of absorbable suture and Dermabond. Sterile dressings were applied. Patient tolerated the procedure well without an immediate complication. Ultrasound and fluoroscopic images were taken and  saved for this procedure. IMPRESSION: Placement of a subcutaneous power-injectable port device. Catheter tip at the superior cavoatrial junction. Electronically Signed   By: Markus Daft M.D.   On: 06/30/2022 17:34    ASSESSMENT AND PLAN: This is a very pleasant 82 years old African-American male diagnosed with stage IIIb (T3, N2, M0) non-small cell lung cancer, squamous cell carcinoma presented with large central urine necrotic right upper lobe lung mass in addition to mediastinal and T3 and T4 vertebral body involvement and precarinal lymph node with PD-L1 expression of 40% diagnosed in January 2024. The patient is currently undergoing a course of concurrent chemoradiation with weekly carboplatin for AUC of 2 and paclitaxel 45 Mg/M2 status post 3 cycles.  He has been tolerating his treatment well with no concerning adverse effects. I recommended for him to proceed with cycle #4 today as planned. For the cough he will continue his current treatment with Tessalon. I will see  the patient back for follow-up visit in 2 weeks for evaluation before starting cycle #6. The patient was advised to call immediately if he has any other concerning symptoms in the interval. The patient voices understanding of current disease status and treatment options and is in agreement with the current care plan.  All questions were answered. The patient knows to call the clinic with any problems, questions or concerns. We can certainly see the patient much sooner if necessary.  The total time spent in the appointment was 20 minutes.  Disclaimer: This note was dictated with voice recognition software. Similar sounding words can inadvertently be transcribed and may not be corrected upon review.

## 2022-07-11 NOTE — Progress Notes (Signed)
Patient seen by MD today  Vitals are within treatment parameters.  Labs reviewed: and are within treatment parameters.  Per physician team, patient is ready for treatment and there are NO modifications to the treatment plan.

## 2022-07-11 NOTE — Patient Instructions (Signed)
[facility]  Discharge Instructions: Thank you for choosing [facility] to provide your oncology and hematology care.   If you have a lab appointment with the [facility], please go directly to the [facility] and check in at the registration area.   Wear comfortable clothing and clothing appropriate for easy access to any Portacath or PICC line.   We strive to give you quality time with your provider. You may need to reschedule your appointment if you arrive late (15 or more minutes).  Arriving late affects you and other patients whose appointments are after yours.  Also, if you miss three or more appointments without notifying the office, you may be dismissed from the clinic at the provider's discretion.      For prescription refill requests, have your pharmacy contact our office and allow 72 hours for refills to be completed.    Today you received the following chemotherapy and/or immunotherapy agents: Paclitaxel & Paraplatin      To help prevent nausea and vomiting after your treatment, we encourage you to take your nausea medication as directed.  BELOW ARE SYMPTOMS THAT SHOULD BE REPORTED IMMEDIATELY: *FEVER GREATER THAN 100.4 F (38 C) OR HIGHER *CHILLS OR SWEATING *NAUSEA AND VOMITING THAT IS NOT CONTROLLED WITH YOUR NAUSEA MEDICATION *UNUSUAL SHORTNESS OF BREATH *UNUSUAL BRUISING OR BLEEDING *URINARY PROBLEMS (pain or burning when urinating, or frequent urination) *BOWEL PROBLEMS (unusual diarrhea, constipation, pain near the anus) TENDERNESS IN MOUTH AND THROAT WITH OR WITHOUT PRESENCE OF ULCERS (sore throat, sores in mouth, or a toothache) UNUSUAL RASH, SWELLING OR PAIN  UNUSUAL VAGINAL DISCHARGE OR ITCHING   Items with * indicate a potential emergency and should be followed up as soon as possible or go to the Emergency Department if any problems should occur.  Please show the CHEMOTHERAPY ALERT CARD or IMMUNOTHERAPY ALERT  CARD at check-in to the Emergency Department and triage nurse.  Should you have questions after your visit or need to cancel or reschedule your appointment, please contact [facility]  Dept: [phone]  and follow the prompts.  Office hours are 8:00 a.m. to 4:30 p.m. Monday - Friday. Please note that voicemails left after 4:00 p.m. may not be returned until the following business day.  We are closed weekends and major holidays. You have access to a nurse at all times for urgent questions. Please call the main number to the clinic Dept: [phone] and follow the prompts.   For any non-urgent questions, you may also contact your provider using MyChart. We now offer e-Visits for anyone 26 and older to request care online for non-urgent symptoms. For details visit [email or web address].   Also download the MyChart app! Go to the app store, search "MyChart", open the app, select [facility], and log in with your MyChart username and password.

## 2022-07-12 ENCOUNTER — Ambulatory Visit
Admission: RE | Admit: 2022-07-12 | Discharge: 2022-07-12 | Disposition: A | Discharge status: Home / Self Care | Payer: Medicare Other | Source: Ambulatory Visit | Attending: Radiation Oncology | Admitting: Radiation Oncology

## 2022-07-12 ENCOUNTER — Other Ambulatory Visit: Payer: Self-pay | Admitting: Radiation Oncology

## 2022-07-12 ENCOUNTER — Other Ambulatory Visit: Payer: Self-pay

## 2022-07-12 DIAGNOSIS — Z5111 Encounter for antineoplastic chemotherapy: Secondary | ICD-10-CM | POA: Diagnosis not present

## 2022-07-12 DIAGNOSIS — Z87891 Personal history of nicotine dependence: Secondary | ICD-10-CM | POA: Diagnosis not present

## 2022-07-12 DIAGNOSIS — R053 Chronic cough: Secondary | ICD-10-CM | POA: Diagnosis not present

## 2022-07-12 DIAGNOSIS — Z510 Encounter for antineoplastic radiation therapy: Secondary | ICD-10-CM | POA: Diagnosis not present

## 2022-07-12 DIAGNOSIS — C3411 Malignant neoplasm of upper lobe, right bronchus or lung: Secondary | ICD-10-CM | POA: Diagnosis not present

## 2022-07-12 LAB — RAD ONC ARIA SESSION SUMMARY
Course Elapsed Days: 20
Plan Fractions Treated to Date: 15
Plan Prescribed Dose Per Fraction: 2 Gy
Plan Total Fractions Prescribed: 30
Plan Total Prescribed Dose: 60 Gy
Reference Point Dosage Given to Date: 30 Gy
Reference Point Session Dosage Given: 2 Gy
Session Number: 15

## 2022-07-12 MED ORDER — SUCRALFATE 1 G PO TABS: 1.0000 g | ORAL_TABLET | Freq: Three times a day (TID) | ORAL | 1 refills | Status: DC

## 2022-07-13 ENCOUNTER — Other Ambulatory Visit: Payer: Self-pay

## 2022-07-13 ENCOUNTER — Ambulatory Visit
Admission: RE | Admit: 2022-07-13 | Discharge: 2022-07-13 | Disposition: A | Discharge status: Home / Self Care | Payer: Medicare Other | Source: Ambulatory Visit | Attending: Radiation Oncology | Admitting: Radiation Oncology

## 2022-07-13 ENCOUNTER — Telehealth: Payer: Self-pay | Admitting: Internal Medicine

## 2022-07-13 DIAGNOSIS — Z87891 Personal history of nicotine dependence: Secondary | ICD-10-CM | POA: Diagnosis not present

## 2022-07-13 DIAGNOSIS — Z5111 Encounter for antineoplastic chemotherapy: Secondary | ICD-10-CM | POA: Diagnosis not present

## 2022-07-13 DIAGNOSIS — Z510 Encounter for antineoplastic radiation therapy: Secondary | ICD-10-CM | POA: Diagnosis not present

## 2022-07-13 DIAGNOSIS — C3411 Malignant neoplasm of upper lobe, right bronchus or lung: Secondary | ICD-10-CM | POA: Diagnosis not present

## 2022-07-13 DIAGNOSIS — R053 Chronic cough: Secondary | ICD-10-CM | POA: Diagnosis not present

## 2022-07-13 LAB — RAD ONC ARIA SESSION SUMMARY
Course Elapsed Days: 21
Plan Fractions Treated to Date: 16
Plan Prescribed Dose Per Fraction: 2 Gy
Plan Total Fractions Prescribed: 30
Plan Total Prescribed Dose: 60 Gy
Reference Point Dosage Given to Date: 32 Gy
Reference Point Session Dosage Given: 2 Gy
Session Number: 16

## 2022-07-13 NOTE — Telephone Encounter (Signed)
Called patient regarding March appointments, spoke with patient's spouse. Patient will be notified.

## 2022-07-14 ENCOUNTER — Other Ambulatory Visit: Payer: Self-pay

## 2022-07-14 ENCOUNTER — Ambulatory Visit
Admission: RE | Admit: 2022-07-14 | Discharge: 2022-07-14 | Disposition: A | Discharge status: Home / Self Care | Payer: Medicare Other | Source: Ambulatory Visit | Attending: Radiation Oncology | Admitting: Radiation Oncology

## 2022-07-14 DIAGNOSIS — C3411 Malignant neoplasm of upper lobe, right bronchus or lung: Secondary | ICD-10-CM | POA: Diagnosis not present

## 2022-07-14 DIAGNOSIS — Z5111 Encounter for antineoplastic chemotherapy: Secondary | ICD-10-CM | POA: Diagnosis not present

## 2022-07-14 DIAGNOSIS — R053 Chronic cough: Secondary | ICD-10-CM | POA: Diagnosis not present

## 2022-07-14 DIAGNOSIS — Z510 Encounter for antineoplastic radiation therapy: Secondary | ICD-10-CM | POA: Diagnosis not present

## 2022-07-14 DIAGNOSIS — Z87891 Personal history of nicotine dependence: Secondary | ICD-10-CM | POA: Diagnosis not present

## 2022-07-14 LAB — RAD ONC ARIA SESSION SUMMARY
Course Elapsed Days: 22
Plan Fractions Treated to Date: 17
Plan Prescribed Dose Per Fraction: 2 Gy
Plan Total Fractions Prescribed: 30
Plan Total Prescribed Dose: 60 Gy
Reference Point Dosage Given to Date: 34 Gy
Reference Point Session Dosage Given: 2 Gy
Session Number: 17

## 2022-07-15 ENCOUNTER — Ambulatory Visit
Admission: RE | Admit: 2022-07-15 | Discharge: 2022-07-15 | Disposition: A | Discharge status: Home / Self Care | Payer: Medicare Other | Source: Ambulatory Visit | Attending: Radiation Oncology | Admitting: Radiation Oncology

## 2022-07-15 ENCOUNTER — Other Ambulatory Visit: Payer: Self-pay

## 2022-07-15 DIAGNOSIS — R053 Chronic cough: Secondary | ICD-10-CM | POA: Diagnosis not present

## 2022-07-15 DIAGNOSIS — Z87891 Personal history of nicotine dependence: Secondary | ICD-10-CM | POA: Diagnosis not present

## 2022-07-15 DIAGNOSIS — Z510 Encounter for antineoplastic radiation therapy: Secondary | ICD-10-CM | POA: Diagnosis not present

## 2022-07-15 DIAGNOSIS — Z5111 Encounter for antineoplastic chemotherapy: Secondary | ICD-10-CM | POA: Diagnosis not present

## 2022-07-15 DIAGNOSIS — C3411 Malignant neoplasm of upper lobe, right bronchus or lung: Secondary | ICD-10-CM | POA: Diagnosis not present

## 2022-07-15 LAB — RAD ONC ARIA SESSION SUMMARY
Course Elapsed Days: 23
Plan Fractions Treated to Date: 18
Plan Prescribed Dose Per Fraction: 2 Gy
Plan Total Fractions Prescribed: 30
Plan Total Prescribed Dose: 60 Gy
Reference Point Dosage Given to Date: 36 Gy
Reference Point Session Dosage Given: 2 Gy
Session Number: 18

## 2022-07-15 MED FILL — Dexamethasone Sodium Phosphate Inj 100 MG/10ML: INTRAMUSCULAR | Qty: 1 | Status: AC

## 2022-07-18 ENCOUNTER — Other Ambulatory Visit: Payer: Self-pay

## 2022-07-18 ENCOUNTER — Telehealth: Payer: Self-pay

## 2022-07-18 ENCOUNTER — Other Ambulatory Visit: Payer: Medicare Other

## 2022-07-18 ENCOUNTER — Inpatient Hospital Stay: Payer: Medicare Other

## 2022-07-18 ENCOUNTER — Ambulatory Visit: Payer: Medicare Other

## 2022-07-18 ENCOUNTER — Ambulatory Visit
Admission: RE | Admit: 2022-07-18 | Discharge: 2022-07-18 | Disposition: A | Discharge status: Home / Self Care | Payer: Medicare Other | Source: Ambulatory Visit | Attending: Radiation Oncology | Admitting: Radiation Oncology

## 2022-07-18 VITALS — BP 150/73 | HR 74 | Temp 97.8°F | Resp 18 | Wt 130.8 lb

## 2022-07-18 DIAGNOSIS — C3491 Malignant neoplasm of unspecified part of right bronchus or lung: Secondary | ICD-10-CM

## 2022-07-18 DIAGNOSIS — Z87891 Personal history of nicotine dependence: Secondary | ICD-10-CM | POA: Diagnosis not present

## 2022-07-18 DIAGNOSIS — D509 Iron deficiency anemia, unspecified: Secondary | ICD-10-CM

## 2022-07-18 DIAGNOSIS — C3411 Malignant neoplasm of upper lobe, right bronchus or lung: Secondary | ICD-10-CM | POA: Diagnosis not present

## 2022-07-18 DIAGNOSIS — Z95828 Presence of other vascular implants and grafts: Secondary | ICD-10-CM

## 2022-07-18 DIAGNOSIS — Z5111 Encounter for antineoplastic chemotherapy: Secondary | ICD-10-CM | POA: Diagnosis not present

## 2022-07-18 DIAGNOSIS — Z510 Encounter for antineoplastic radiation therapy: Secondary | ICD-10-CM | POA: Diagnosis not present

## 2022-07-18 DIAGNOSIS — R053 Chronic cough: Secondary | ICD-10-CM | POA: Diagnosis not present

## 2022-07-18 LAB — CMP (CANCER CENTER ONLY)
ALT: 11 U/L (ref 0–44)
AST: 17 U/L (ref 15–41)
Albumin: 3.1 g/dL — ABNORMAL LOW (ref 3.5–5.0)
Alkaline Phosphatase: 50 U/L (ref 38–126)
Anion gap: 7 (ref 5–15)
BUN: 13 mg/dL (ref 8–23)
CO2: 26 mmol/L (ref 22–32)
Calcium: 8.9 mg/dL (ref 8.9–10.3)
Chloride: 101 mmol/L (ref 98–111)
Creatinine: 0.78 mg/dL (ref 0.61–1.24)
GFR, Estimated: >60 mL/min (ref >60)
Glucose, Bld: 265 mg/dL — ABNORMAL HIGH (ref 70–99)
Potassium: 3.6 mmol/L (ref 3.5–5.1)
Sodium: 134 mmol/L — ABNORMAL LOW (ref 135–145)
Total Bilirubin: 0.3 mg/dL (ref 0.3–1.2)
Total Protein: 6.9 g/dL (ref 6.5–8.1)

## 2022-07-18 LAB — RAD ONC ARIA SESSION SUMMARY
Course Elapsed Days: 26
Plan Fractions Treated to Date: 19
Plan Prescribed Dose Per Fraction: 2 Gy
Plan Total Fractions Prescribed: 30
Plan Total Prescribed Dose: 60 Gy
Reference Point Dosage Given to Date: 38 Gy
Reference Point Session Dosage Given: 2 Gy
Session Number: 19

## 2022-07-18 LAB — CBC WITH DIFFERENTIAL (CANCER CENTER ONLY)
Abs Immature Granulocytes: 0.03 10*3/uL (ref 0.00–0.07)
Basophils Absolute: 0 10*3/uL (ref 0.0–0.1)
Basophils Relative: 1 %
Eosinophils Absolute: 0.1 10*3/uL (ref 0.0–0.5)
Eosinophils Relative: 3 %
HCT: 27.8 % — ABNORMAL LOW (ref 39.0–52.0)
Hemoglobin: 9.1 g/dL — ABNORMAL LOW (ref 13.0–17.0)
Immature Granulocytes: 1 %
Lymphocytes Relative: 9 %
Lymphs Abs: 0.4 10*3/uL — ABNORMAL LOW (ref 0.7–4.0)
MCH: 26.9 pg (ref 26.0–34.0)
MCHC: 32.7 g/dL (ref 30.0–36.0)
MCV: 82.2 fL (ref 80.0–100.0)
Monocytes Absolute: 0.6 10*3/uL (ref 0.1–1.0)
Monocytes Relative: 13 %
Neutro Abs: 3.2 10*3/uL (ref 1.7–7.7)
Neutrophils Relative %: 73 %
Platelet Count: 230 10*3/uL (ref 150–400)
RBC: 3.38 MIL/uL — ABNORMAL LOW (ref 4.22–5.81)
RDW: 22.3 % — ABNORMAL HIGH (ref 11.5–15.5)
WBC Count: 4.4 10*3/uL (ref 4.0–10.5)
nRBC: 0 % (ref 0.0–0.2)

## 2022-07-18 LAB — ACID FAST CULTURE WITH REFLEXED SENSITIVITIES (MYCOBACTERIA): Acid Fast Culture: NEGATIVE

## 2022-07-18 LAB — SAMPLE TO BLOOD BANK

## 2022-07-18 MED ORDER — FAMOTIDINE IN NACL 20-0.9 MG/50ML-% IV SOLN
20.0000 mg | Freq: Once | INTRAVENOUS | Status: AC
  Administered 2022-07-18: 20 mg via INTRAVENOUS
  Filled 2022-07-18: qty 50

## 2022-07-18 MED ORDER — HEPARIN SOD (PORK) LOCK FLUSH 100 UNIT/ML IV SOLN
500.0000 [IU] | Freq: Once | INTRAVENOUS | Status: AC | PRN
  Administered 2022-07-18: 500 [IU]

## 2022-07-18 MED ORDER — DIPHENHYDRAMINE HCL 50 MG/ML IJ SOLN
50.0000 mg | Freq: Once | INTRAMUSCULAR | Status: AC
  Administered 2022-07-18: 50 mg via INTRAVENOUS
  Filled 2022-07-18: qty 1

## 2022-07-18 MED ORDER — SODIUM CHLORIDE 0.9 % IV SOLN
45.0000 mg/m2 | Freq: Once | INTRAVENOUS | Status: AC
  Administered 2022-07-18: 78 mg via INTRAVENOUS
  Filled 2022-07-18: qty 13

## 2022-07-18 MED ORDER — PALONOSETRON HCL INJECTION 0.25 MG/5ML
0.2500 mg | Freq: Once | INTRAVENOUS | Status: AC
  Administered 2022-07-18: 0.25 mg via INTRAVENOUS
  Filled 2022-07-18: qty 5

## 2022-07-18 MED ORDER — SODIUM CHLORIDE 0.9% FLUSH
10.0000 mL | INTRAVENOUS | Status: DC | PRN
  Administered 2022-07-18: 10 mL

## 2022-07-18 MED ORDER — SODIUM CHLORIDE 0.9 % IV SOLN
10.0000 mg | Freq: Once | INTRAVENOUS | Status: AC
  Administered 2022-07-18: 10 mg via INTRAVENOUS
  Filled 2022-07-18: qty 10

## 2022-07-18 MED ORDER — SODIUM CHLORIDE 0.9 % IV SOLN: Freq: Once | INTRAVENOUS | Status: AC

## 2022-07-18 MED ORDER — SODIUM CHLORIDE 0.9 % IV SOLN
153.0000 mg | Freq: Once | INTRAVENOUS | Status: AC
  Administered 2022-07-18: 150 mg via INTRAVENOUS
  Filled 2022-07-18: qty 15

## 2022-07-18 MED ORDER — SODIUM CHLORIDE 0.9% FLUSH
10.0000 mL | Freq: Once | INTRAVENOUS | Status: AC
  Administered 2022-07-18: 10 mL

## 2022-07-18 NOTE — Patient Instructions (Signed)
[facility]  Discharge Instructions: Thank you for choosing [facility] to provide your oncology and hematology care.   If you have a lab appointment with the Cancer Center, please go directly to the Cancer Center and check in at the registration area.   Wear comfortable clothing and clothing appropriate for easy access to any Portacath or PICC line.   We strive to give you quality time with your provider. You may need to reschedule your appointment if you arrive late (15 or more minutes).  Arriving late affects you and other patients whose appointments are after yours.  Also, if you miss three or more appointments without notifying the office, you may be dismissed from the clinic at the provider's discretion.      For prescription refill requests, have your pharmacy contact our office and allow 72 hours for refills to be completed.    Today you received the following chemotherapy and/or immunotherapy agents Taxol Carbo      To help prevent nausea and vomiting after your treatment, we encourage you to take your nausea medication as directed.  BELOW ARE SYMPTOMS THAT SHOULD BE REPORTED IMMEDIATELY: *FEVER GREATER THAN 100.4 F (38 C) OR HIGHER *CHILLS OR SWEATING *NAUSEA AND VOMITING THAT IS NOT CONTROLLED WITH YOUR NAUSEA MEDICATION *UNUSUAL SHORTNESS OF BREATH *UNUSUAL BRUISING OR BLEEDING *URINARY PROBLEMS (pain or burning when urinating, or frequent urination) *BOWEL PROBLEMS (unusual diarrhea, constipation, pain near the anus) TENDERNESS IN MOUTH AND THROAT WITH OR WITHOUT PRESENCE OF ULCERS (sore throat, sores in mouth, or a toothache) UNUSUAL RASH, SWELLING OR PAIN  UNUSUAL VAGINAL DISCHARGE OR ITCHING   Items with * indicate a potential emergency and should be followed up as soon as possible or go to the Emergency Department if any problems should occur.  Please show the CHEMOTHERAPY ALERT CARD or IMMUNOTHERAPY ALERT CARD at  check-in to the Emergency Department and triage nurse.  Should you have questions after your visit or need to cancel or reschedule your appointment, please contact [facility]  Dept: [phone]  and follow the prompts.  Office hours are 8:00 a.m. to 4:30 p.m. Monday - Friday. Please note that voicemails left after 4:00 p.m. may not be returned until the following business day.  We are closed weekends and major holidays. You have access to a nurse at all times for urgent questions. Please call the main number to the clinic Dept: [phone] and follow the prompts.   For any non-urgent questions, you may also contact your provider using MyChart. We now offer e-Visits for anyone 18 and older to request care online for non-urgent symptoms. For details visit [email or web address].   Also download the MyChart app! Go to the app store, search "MyChart", open the app, select [facility], and log in with your MyChart username and password.   

## 2022-07-18 NOTE — Telephone Encounter (Signed)
CRITICAL VALUE STICKER  CRITICAL VALUE: HGB 9.1  RECEIVER (on-site recipient of call): Jarold Macomber P. LPN   DATE & TIME NOTIFIED: 07/18/22 8:15 am  MESSENGER (representative from lab): Janett Billow  MD NOTIFIED: Dr. Julien Nordmann   TIME OF NOTIFICATION: 8:18 am

## 2022-07-19 ENCOUNTER — Ambulatory Visit
Admission: RE | Admit: 2022-07-19 | Discharge: 2022-07-19 | Disposition: A | Discharge status: Home / Self Care | Payer: Medicare Other | Source: Ambulatory Visit | Attending: Radiation Oncology | Admitting: Radiation Oncology

## 2022-07-19 ENCOUNTER — Other Ambulatory Visit: Payer: Self-pay

## 2022-07-19 DIAGNOSIS — Z5111 Encounter for antineoplastic chemotherapy: Secondary | ICD-10-CM | POA: Diagnosis not present

## 2022-07-19 DIAGNOSIS — R053 Chronic cough: Secondary | ICD-10-CM | POA: Diagnosis not present

## 2022-07-19 DIAGNOSIS — Z510 Encounter for antineoplastic radiation therapy: Secondary | ICD-10-CM | POA: Diagnosis not present

## 2022-07-19 DIAGNOSIS — C3411 Malignant neoplasm of upper lobe, right bronchus or lung: Secondary | ICD-10-CM | POA: Diagnosis not present

## 2022-07-19 DIAGNOSIS — Z87891 Personal history of nicotine dependence: Secondary | ICD-10-CM | POA: Diagnosis not present

## 2022-07-19 LAB — RAD ONC ARIA SESSION SUMMARY
Course Elapsed Days: 27
Plan Fractions Treated to Date: 20
Plan Prescribed Dose Per Fraction: 2 Gy
Plan Total Fractions Prescribed: 30
Plan Total Prescribed Dose: 60 Gy
Reference Point Dosage Given to Date: 40 Gy
Reference Point Session Dosage Given: 2 Gy
Session Number: 20

## 2022-07-20 ENCOUNTER — Other Ambulatory Visit: Payer: Self-pay

## 2022-07-20 ENCOUNTER — Ambulatory Visit
Admission: RE | Admit: 2022-07-20 | Discharge: 2022-07-20 | Disposition: A | Discharge status: Home / Self Care | Payer: Medicare Other | Source: Ambulatory Visit | Attending: Radiation Oncology | Admitting: Radiation Oncology

## 2022-07-20 DIAGNOSIS — Z510 Encounter for antineoplastic radiation therapy: Secondary | ICD-10-CM | POA: Diagnosis not present

## 2022-07-20 DIAGNOSIS — Z87891 Personal history of nicotine dependence: Secondary | ICD-10-CM | POA: Diagnosis not present

## 2022-07-20 DIAGNOSIS — R053 Chronic cough: Secondary | ICD-10-CM | POA: Diagnosis not present

## 2022-07-20 DIAGNOSIS — C3411 Malignant neoplasm of upper lobe, right bronchus or lung: Secondary | ICD-10-CM | POA: Diagnosis not present

## 2022-07-20 DIAGNOSIS — Z5111 Encounter for antineoplastic chemotherapy: Secondary | ICD-10-CM | POA: Diagnosis not present

## 2022-07-20 LAB — RAD ONC ARIA SESSION SUMMARY
Course Elapsed Days: 28
Plan Fractions Treated to Date: 21
Plan Prescribed Dose Per Fraction: 2 Gy
Plan Total Fractions Prescribed: 30
Plan Total Prescribed Dose: 60 Gy
Reference Point Dosage Given to Date: 42 Gy
Reference Point Session Dosage Given: 2 Gy
Session Number: 21

## 2022-07-21 ENCOUNTER — Ambulatory Visit
Admission: RE | Admit: 2022-07-21 | Discharge: 2022-07-21 | Disposition: A | Discharge status: Home / Self Care | Payer: Medicare Other | Source: Ambulatory Visit | Attending: Radiation Oncology | Admitting: Radiation Oncology

## 2022-07-21 ENCOUNTER — Other Ambulatory Visit: Payer: Self-pay

## 2022-07-21 DIAGNOSIS — R053 Chronic cough: Secondary | ICD-10-CM | POA: Diagnosis not present

## 2022-07-21 DIAGNOSIS — Z5111 Encounter for antineoplastic chemotherapy: Secondary | ICD-10-CM | POA: Diagnosis not present

## 2022-07-21 DIAGNOSIS — Z87891 Personal history of nicotine dependence: Secondary | ICD-10-CM | POA: Diagnosis not present

## 2022-07-21 DIAGNOSIS — Z510 Encounter for antineoplastic radiation therapy: Secondary | ICD-10-CM | POA: Diagnosis not present

## 2022-07-21 DIAGNOSIS — C3411 Malignant neoplasm of upper lobe, right bronchus or lung: Secondary | ICD-10-CM | POA: Diagnosis not present

## 2022-07-21 LAB — RAD ONC ARIA SESSION SUMMARY
Course Elapsed Days: 29
Plan Fractions Treated to Date: 22
Plan Prescribed Dose Per Fraction: 2 Gy
Plan Total Fractions Prescribed: 30
Plan Total Prescribed Dose: 60 Gy
Reference Point Dosage Given to Date: 44 Gy
Reference Point Session Dosage Given: 2 Gy
Session Number: 22

## 2022-07-22 ENCOUNTER — Ambulatory Visit
Admission: RE | Admit: 2022-07-22 | Discharge: 2022-07-22 | Disposition: A | Discharge status: Home / Self Care | Payer: Medicare Other | Source: Ambulatory Visit | Attending: Radiation Oncology | Admitting: Radiation Oncology

## 2022-07-22 ENCOUNTER — Other Ambulatory Visit: Payer: Self-pay

## 2022-07-22 DIAGNOSIS — Z5111 Encounter for antineoplastic chemotherapy: Secondary | ICD-10-CM | POA: Diagnosis not present

## 2022-07-22 DIAGNOSIS — C3411 Malignant neoplasm of upper lobe, right bronchus or lung: Secondary | ICD-10-CM | POA: Diagnosis not present

## 2022-07-22 DIAGNOSIS — Z87891 Personal history of nicotine dependence: Secondary | ICD-10-CM | POA: Diagnosis not present

## 2022-07-22 DIAGNOSIS — R053 Chronic cough: Secondary | ICD-10-CM | POA: Diagnosis not present

## 2022-07-22 DIAGNOSIS — Z510 Encounter for antineoplastic radiation therapy: Secondary | ICD-10-CM | POA: Diagnosis not present

## 2022-07-22 LAB — RAD ONC ARIA SESSION SUMMARY
Course Elapsed Days: 30
Plan Fractions Treated to Date: 23
Plan Prescribed Dose Per Fraction: 2 Gy
Plan Total Fractions Prescribed: 30
Plan Total Prescribed Dose: 60 Gy
Reference Point Dosage Given to Date: 46 Gy
Reference Point Session Dosage Given: 2 Gy
Session Number: 23

## 2022-07-22 MED FILL — Dexamethasone Sodium Phosphate Inj 100 MG/10ML: INTRAMUSCULAR | Qty: 1 | Status: AC

## 2022-07-25 ENCOUNTER — Inpatient Hospital Stay (HOSPITAL_BASED_OUTPATIENT_CLINIC_OR_DEPARTMENT_OTHER): Payer: Medicare Other | Admitting: Internal Medicine

## 2022-07-25 ENCOUNTER — Encounter: Payer: Self-pay | Admitting: Medical Oncology

## 2022-07-25 ENCOUNTER — Inpatient Hospital Stay: Payer: Medicare Other

## 2022-07-25 ENCOUNTER — Other Ambulatory Visit: Payer: Medicare Other

## 2022-07-25 ENCOUNTER — Other Ambulatory Visit: Payer: Self-pay

## 2022-07-25 ENCOUNTER — Ambulatory Visit: Payer: Medicare Other

## 2022-07-25 VITALS — BP 123/62 | HR 74 | Temp 97.7°F | Resp 16 | Wt 128.5 lb

## 2022-07-25 DIAGNOSIS — C3490 Malignant neoplasm of unspecified part of unspecified bronchus or lung: Secondary | ICD-10-CM

## 2022-07-25 DIAGNOSIS — C3491 Malignant neoplasm of unspecified part of right bronchus or lung: Secondary | ICD-10-CM

## 2022-07-25 DIAGNOSIS — Z95828 Presence of other vascular implants and grafts: Secondary | ICD-10-CM

## 2022-07-25 DIAGNOSIS — C3411 Malignant neoplasm of upper lobe, right bronchus or lung: Secondary | ICD-10-CM | POA: Diagnosis not present

## 2022-07-25 DIAGNOSIS — D509 Iron deficiency anemia, unspecified: Secondary | ICD-10-CM

## 2022-07-25 DIAGNOSIS — Z510 Encounter for antineoplastic radiation therapy: Secondary | ICD-10-CM | POA: Diagnosis not present

## 2022-07-25 DIAGNOSIS — R053 Chronic cough: Secondary | ICD-10-CM | POA: Diagnosis not present

## 2022-07-25 DIAGNOSIS — Z5111 Encounter for antineoplastic chemotherapy: Secondary | ICD-10-CM | POA: Diagnosis not present

## 2022-07-25 LAB — CBC WITH DIFFERENTIAL (CANCER CENTER ONLY)
Abs Immature Granulocytes: 0.03 10*3/uL (ref 0.00–0.07)
Basophils Absolute: 0 10*3/uL (ref 0.0–0.1)
Basophils Relative: 1 %
Eosinophils Absolute: 0.1 10*3/uL (ref 0.0–0.5)
Eosinophils Relative: 2 %
HCT: 24.9 % — ABNORMAL LOW (ref 39.0–52.0)
Hemoglobin: 8.3 g/dL — ABNORMAL LOW (ref 13.0–17.0)
Immature Granulocytes: 1 %
Lymphocytes Relative: 13 %
Lymphs Abs: 0.5 10*3/uL — ABNORMAL LOW (ref 0.7–4.0)
MCH: 27.6 pg (ref 26.0–34.0)
MCHC: 33.3 g/dL (ref 30.0–36.0)
MCV: 82.7 fL (ref 80.0–100.0)
Monocytes Absolute: 0.5 10*3/uL (ref 0.1–1.0)
Monocytes Relative: 15 %
Neutro Abs: 2.5 10*3/uL (ref 1.7–7.7)
Neutrophils Relative %: 68 %
Platelet Count: 206 10*3/uL (ref 150–400)
RBC: 3.01 MIL/uL — ABNORMAL LOW (ref 4.22–5.81)
RDW: 24.4 % — ABNORMAL HIGH (ref 11.5–15.5)
WBC Count: 3.7 10*3/uL — ABNORMAL LOW (ref 4.0–10.5)
nRBC: 0 % (ref 0.0–0.2)

## 2022-07-25 LAB — CMP (CANCER CENTER ONLY)
ALT: 11 U/L (ref 0–44)
AST: 17 U/L (ref 15–41)
Albumin: 3.3 g/dL — ABNORMAL LOW (ref 3.5–5.0)
Alkaline Phosphatase: 48 U/L (ref 38–126)
Anion gap: 6 (ref 5–15)
BUN: 15 mg/dL (ref 8–23)
CO2: 27 mmol/L (ref 22–32)
Calcium: 9 mg/dL (ref 8.9–10.3)
Chloride: 104 mmol/L (ref 98–111)
Creatinine: 0.74 mg/dL (ref 0.61–1.24)
GFR, Estimated: >60 mL/min (ref >60)
Glucose, Bld: 90 mg/dL (ref 70–99)
Potassium: 4.1 mmol/L (ref 3.5–5.1)
Sodium: 137 mmol/L (ref 135–145)
Total Bilirubin: 0.3 mg/dL (ref 0.3–1.2)
Total Protein: 7.2 g/dL (ref 6.5–8.1)

## 2022-07-25 LAB — SAMPLE TO BLOOD BANK

## 2022-07-25 MED ORDER — FAMOTIDINE IN NACL 20-0.9 MG/50ML-% IV SOLN
20.0000 mg | Freq: Once | INTRAVENOUS | Status: AC
  Administered 2022-07-25: 20 mg via INTRAVENOUS
  Filled 2022-07-25: qty 50

## 2022-07-25 MED ORDER — HEPARIN SOD (PORK) LOCK FLUSH 100 UNIT/ML IV SOLN
500.0000 [IU] | Freq: Once | INTRAVENOUS | Status: AC | PRN
  Administered 2022-07-25: 500 [IU]

## 2022-07-25 MED ORDER — SODIUM CHLORIDE 0.9 % IV SOLN
153.0000 mg | Freq: Once | INTRAVENOUS | Status: AC
  Administered 2022-07-25: 150 mg via INTRAVENOUS
  Filled 2022-07-25: qty 15

## 2022-07-25 MED ORDER — CETIRIZINE HCL 10 MG/ML IV SOLN: 10.0000 mg | Freq: Once | INTRAVENOUS | Status: DC

## 2022-07-25 MED ORDER — DIPHENHYDRAMINE HCL 50 MG/ML IJ SOLN
50.0000 mg | Freq: Once | INTRAMUSCULAR | Status: AC
  Administered 2022-07-25: 50 mg via INTRAVENOUS
  Filled 2022-07-25: qty 1

## 2022-07-25 MED ORDER — PALONOSETRON HCL INJECTION 0.25 MG/5ML
0.2500 mg | Freq: Once | INTRAVENOUS | Status: AC
  Administered 2022-07-25: 0.25 mg via INTRAVENOUS
  Filled 2022-07-25: qty 5

## 2022-07-25 MED ORDER — SODIUM CHLORIDE 0.9 % IV SOLN: Freq: Once | INTRAVENOUS | Status: AC

## 2022-07-25 MED ORDER — SODIUM CHLORIDE 0.9% FLUSH
10.0000 mL | Freq: Once | INTRAVENOUS | Status: AC
  Administered 2022-07-25: 10 mL

## 2022-07-25 MED ORDER — SODIUM CHLORIDE 0.9% FLUSH
10.0000 mL | INTRAVENOUS | Status: DC | PRN
  Administered 2022-07-25: 10 mL

## 2022-07-25 MED ORDER — SODIUM CHLORIDE 0.9 % IV SOLN
10.0000 mg | Freq: Once | INTRAVENOUS | Status: AC
  Administered 2022-07-25: 10 mg via INTRAVENOUS
  Filled 2022-07-25: qty 10

## 2022-07-25 MED ORDER — SODIUM CHLORIDE 0.9 % IV SOLN
45.0000 mg/m2 | Freq: Once | INTRAVENOUS | Status: AC
  Administered 2022-07-25: 78 mg via INTRAVENOUS
  Filled 2022-07-25: qty 13

## 2022-07-25 NOTE — Patient Instructions (Signed)
[facility]  Discharge Instructions: Thank you for choosing [facility] to provide your oncology and hematology care.   If you have a lab appointment with the Cancer Center, please go directly to the Cancer Center and check in at the registration area.   Wear comfortable clothing and clothing appropriate for easy access to any Portacath or PICC line.   We strive to give you quality time with your provider. You may need to reschedule your appointment if you arrive late (15 or more minutes).  Arriving late affects you and other patients whose appointments are after yours.  Also, if you miss three or more appointments without notifying the office, you may be dismissed from the clinic at the provider's discretion.      For prescription refill requests, have your pharmacy contact our office and allow 72 hours for refills to be completed.    Today you received the following chemotherapy and/or immunotherapy agents Taxol Carbo      To help prevent nausea and vomiting after your treatment, we encourage you to take your nausea medication as directed.  BELOW ARE SYMPTOMS THAT SHOULD BE REPORTED IMMEDIATELY: *FEVER GREATER THAN 100.4 F (38 C) OR HIGHER *CHILLS OR SWEATING *NAUSEA AND VOMITING THAT IS NOT CONTROLLED WITH YOUR NAUSEA MEDICATION *UNUSUAL SHORTNESS OF BREATH *UNUSUAL BRUISING OR BLEEDING *URINARY PROBLEMS (pain or burning when urinating, or frequent urination) *BOWEL PROBLEMS (unusual diarrhea, constipation, pain near the anus) TENDERNESS IN MOUTH AND THROAT WITH OR WITHOUT PRESENCE OF ULCERS (sore throat, sores in mouth, or a toothache) UNUSUAL RASH, SWELLING OR PAIN  UNUSUAL VAGINAL DISCHARGE OR ITCHING   Items with * indicate a potential emergency and should be followed up as soon as possible or go to the Emergency Department if any problems should occur.  Please show the CHEMOTHERAPY ALERT CARD or IMMUNOTHERAPY ALERT CARD at  check-in to the Emergency Department and triage nurse.  Should you have questions after your visit or need to cancel or reschedule your appointment, please contact [facility]  Dept: [phone]  and follow the prompts.  Office hours are 8:00 a.m. to 4:30 p.m. Monday - Friday. Please note that voicemails left after 4:00 p.m. may not be returned until the following business day.  We are closed weekends and major holidays. You have access to a nurse at all times for urgent questions. Please call the main number to the clinic Dept: [phone] and follow the prompts.   For any non-urgent questions, you may also contact your provider using MyChart. We now offer e-Visits for anyone 18 and older to request care online for non-urgent symptoms. For details visit [email or web address].   Also download the MyChart app! Go to the app store, search "MyChart", open the app, select [facility], and log in with your MyChart username and password.   

## 2022-07-25 NOTE — Progress Notes (Signed)
[facility] [phone]   [phone]  OFFICE PROGRESS NOTE  [name], [name], MD [address] [place] [address]  DIAGNOSIS: Stage IIIB (T3, N2, M0) non-small cell lung cancer, squamous cell carcinoma.  The patient presented with a large centrally necrotic right upper lobe lung mass abutting the mediastinum, T3 and T4 vertebral bodies and a hypermetabolic precarinal lymph node diagnosed in January 2024.     PDL1: 40%    PRIOR THERAPY: None     CURRENT THERAPY: Concurrent chemoradiation with carboplatin for an AUC of 2 and paclitaxel 45 mg/m starting 06/20/2022.  Status post 5 cycles.   INTERVAL HISTORY: [name] 82 y.o. male returns to the clinic today for follow-up visit accompanied by his wife.  The patient is feeling fine today with no concerning complaints except for mild fatigue.  The patient denied having any significant chest pain, shortness of breath cough or hemoptysis.  He has no nausea, vomiting, diarrhea or constipation.  He lost few pounds recently because of odynophagia.  He denied having any headache or visual changes.  He continues to tolerate his course of concurrent chemoradiation fairly well.  The patient is here today for evaluation before starting cycle #6.  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  MEDICATIONS:  Current Outpatient Medications  Medication Sig Dispense Refill   sucralfate (CARAFATE) 1 g tablet Take 1 tablet (1 g total) by mouth 4 (four) times daily -  with meals and at bedtime. Crush and dissolve in 10 mL of warm water, swallow 20 min before meals 120 tablet 1   acetaminophen (TYLENOL) 500 MG tablet Take 500 mg by mouth every 6 (six) hours as needed for mild pain or moderate pain.     amLODipine (NORVASC) 10 MG tablet Take 1 tablet (10 mg total) by mouth daily. 30 tablet 0   aspirin EC 81 MG EC tablet Take 1 tablet (81 mg total) by mouth daily.     atorvastatin (LIPITOR) 20 MG tablet Take 20 mg by mouth daily.     benzonatate (TESSALON) 100 MG capsule Take 1 capsule (100 mg total) by mouth 3 (three) times daily as needed for cough. 30 capsule 2   dextromethorphan-guaiFENesin (MUCINEX DM) 30-600 MG 12hr tablet Take 1 tablet by mouth 2 (two) times daily as needed for cough. (Patient not taking: Reported on 06/27/2022)     fluticasone-salmeterol (ADVAIR HFA) 115-21 MCG/ACT inhaler Inhale 2 puffs into the lungs 2 (two) times daily. 1 each 11   glucose blood test strip      glucose blood test strip as directed DX 250     hydrochlorothiazide (HYDRODIURIL) 12.5 MG tablet Take 1 tablet (12.5  mg total) by mouth daily. 30 tablet 0   lidocaine-prilocaine (EMLA) cream Apply 1 Application topically as needed. (Patient not taking: Reported on 06/27/2022) 30 g 2   metFORMIN (GLUCOPHAGE) 1000 MG tablet Take 1,000 mg by mouth 2 (two) times daily with a meal.     Multiple Vitamin (MULTIVITAMIN WITH MINERALS) TABS tablet Take 1 tablet by mouth daily.     prochlorperazine (COMPAZINE) 10 MG tablet Take 1 tablet (10 mg total) by mouth every 6 (six) hours as needed. (Patient not taking: Reported on 06/27/2022) 30 tablet 2   SF 5000 PLUS 1.1 % CREA dental cream Place 1 Application onto teeth daily.  2   tamsulosin (FLOMAX) 0.4 MG CAPS  capsule Take 0.4 mg by mouth daily.     No current facility-administered medications for this visit.    SURGICAL HISTORY:  Past Surgical History:  Procedure Laterality Date   AORTIC VALVE REPLACEMENT N/A 11/19/2013   Procedure: AORTIC VALVE REPLACEMENT (AVR);  Surgeon: Gaye Pollack, MD;  Location: [facility];  Service: Open Heart Surgery;  Laterality: N/A;   BUNIONECTOMY Bilateral 05/09/2006   CARDIAC CATHETERIZATION  10/01/2013   COLONOSCOPY  01/07/2010   internal hemorrhoids.  Dr Deatra Ina   ESOPHAGOGASTRODUODENOSCOPY N/A 04/24/2013   Procedure: ESOPHAGOGASTRODUODENOSCOPY (EGD);  Surgeon: Jerene Bears, MD;  Location: [facility] ENDOSCOPY;  Service: Endoscopy;  Laterality: N/A;   INGUINAL HERNIA REPAIR Right    INTRAOPERATIVE TRANSESOPHAGEAL ECHOCARDIOGRAM N/A 11/19/2013   Procedure: INTRAOPERATIVE TRANSESOPHAGEAL ECHOCARDIOGRAM;  Surgeon: Gaye Pollack, MD;  Location: [facility] OR;  Service: Open Heart Surgery;  Laterality: N/A;   IR IMAGING GUIDED PORT INSERTION  06/30/2022   KNEE ARTHROSCOPY Left 05/09/2009   Dr Collier Salina   LAMINECTOMY  06/09/2005   Dr Joya Salm. multiple lumbar level laminectomies.    LEFT AND RIGHT HEART CATHETERIZATION WITH CORONARY ANGIOGRAM N/A 10/01/2013   Procedure: LEFT AND RIGHT HEART CATHETERIZATION WITH CORONARY ANGIOGRAM;  Surgeon: Candee Furbish, MD;  [facility];  Service: Cardiovascular;  Laterality: N/A;   SPINAL FUSION     lumbar   TEE WITHOUT CARDIOVERSION  11/07/2003   Aoritic valve sclerosis.    TONSILLECTOMY     VIDEO BRONCHOSCOPY WITH ENDOBRONCHIAL ULTRASOUND N/A 06/02/2022   Procedure: VIDEO BRONCHOSCOPY WITH ENDOBRONCHIAL ULTRASOUND;  Surgeon: Margaretha Seeds, MD;  Location: [facility];  Service: Thoracic;  Laterality: N/A;    REVIEW OF SYSTEMS:  A comprehensive review of systems was negative except for: Constitutional: positive for fatigue and weight loss Gastrointestinal: positive for odynophagia   PHYSICAL EXAMINATION: General appearance: alert,  cooperative, fatigued, and no distress Head: Normocephalic, without obvious abnormality, atraumatic Neck: no adenopathy, no JVD, supple, symmetrical, trachea midline, and thyroid not enlarged, symmetric, no tenderness/mass/nodules Lymph nodes: Cervical, supraclavicular, and axillary nodes normal. Resp: clear to auscultation bilaterally Back: symmetric, no curvature. ROM normal. No CVA tenderness. Cardio: regular rate and rhythm, S1, S2 normal, no murmur, click, rub or gallop GI: soft, non-tender; bowel sounds normal; no masses,  no organomegaly Extremities: extremities normal, atraumatic, no cyanosis or edema  ECOG PERFORMANCE STATUS: 1 - Symptomatic but completely ambulatory  Blood pressure 123/62, pulse 74, temperature 97.7 F (36.5 C), temperature source Oral, resp. rate 16, weight 128 lb 8 oz (58.3 kg), SpO2 100 %.  LABORATORY DATA: Lab Results  Component Value Date   WBC 3.7 (L) 07/25/2022   HGB 8.3 (L) 07/25/2022   HCT 24.9 (L) 07/25/2022   MCV 82.7 07/25/2022   PLT 206 07/25/2022  Chemistry      Component Value Date/Time   NA 134 (L) 07/18/2022 0736   K 3.6 07/18/2022 0736   CL 101 07/18/2022 0736   CO2 26 07/18/2022 0736   BUN 13 07/18/2022 0736   CREATININE 0.78 07/18/2022 0736      Component Value Date/Time   CALCIUM 8.9 07/18/2022 0736   ALKPHOS 50 07/18/2022 0736   AST 17 07/18/2022 0736   ALT 11 07/18/2022 0736   BILITOT 0.3 07/18/2022 0736       RADIOGRAPHIC STUDIES: IR IMAGING GUIDED PORT INSERTION  Result Date: 06/30/2022 INDICATION: 82 year old with squamous cell carcinoma of the right lung. Port-A-Cath needed for treatment. EXAM: FLUOROSCOPIC AND ULTRASOUND GUIDED PLACEMENT OF A SUBCUTANEOUS PORT COMPARISON:  None Available. MEDICATIONS: Moderate sedation ANESTHESIA/SEDATION: Moderate (conscious) sedation was employed during this procedure. A total of Versed 2 mg and fentanyl 100 mcg was administered intravenously at the order of the provider  performing the procedure. Total intra-service moderate sedation time: 24 minutes. Patient's level of consciousness and vital signs were monitored continuously by radiology nurse throughout the procedure under the supervision of the provider performing the procedure. FLUOROSCOPY TIME:  Radiation Exposure Index (as provided by the fluoroscopic device): 1 mGy Kerma COMPLICATIONS: None immediate. PROCEDURE: The procedure, risks, benefits, and alternatives were explained to the patient. Questions regarding the procedure were encouraged and answered. The patient understands and consents to the procedure. Patient was placed supine on the interventional table. Ultrasound confirmed a patent right internal jugular vein. Ultrasound image was saved for documentation. The right chest and neck were cleaned with a skin antiseptic and a sterile drape was placed. Maximal barrier sterile technique was utilized including caps, mask, sterile gowns, sterile gloves, sterile drape, hand hygiene and skin antiseptic. The right neck was anesthetized with 1% lidocaine. Small incision was made in the right neck with a blade. Micropuncture set was placed in the right internal jugular vein with ultrasound guidance. The micropuncture wire was used for measurement purposes. The right chest was anesthetized with 1% lidocaine with epinephrine. #15 blade was used to make an incision and a subcutaneous port pocket was formed. [place] was assembled. Subcutaneous tunnel was formed with a stiff tunneling device. The port catheter was brought through the subcutaneous tunnel. The port was placed in the subcutaneous pocket. The micropuncture set was exchanged for a peel-away sheath. The catheter was placed through the peel-away sheath and the tip was positioned at the superior cavoatrial junction. Catheter placement was confirmed with fluoroscopy. The port was accessed and flushed with heparinized saline. The port pocket was closed using two  layers of absorbable sutures and Dermabond. The vein skin site was closed using a single layer of absorbable suture and Dermabond. Sterile dressings were applied. Patient tolerated the procedure well without an immediate complication. Ultrasound and fluoroscopic images were taken and saved for this procedure. IMPRESSION: Placement of a subcutaneous power-injectable port device. Catheter tip at the superior cavoatrial junction. Electronically Signed   By: Markus Daft M.D.   On: 06/30/2022 17:34    ASSESSMENT AND PLAN: This is a very pleasant 82 years old African-American male diagnosed with stage IIIb (T3, N2, M0) non-small cell lung cancer, squamous cell carcinoma presented with large central urine necrotic right upper lobe lung mass in addition to mediastinal and T3 and T4 vertebral body involvement and precarinal lymph node with PD-L1 expression of 40% diagnosed in January 2024. The patient is currently undergoing a course of concurrent chemoradiation with weekly carboplatin  for AUC of 2 and paclitaxel 45 Mg/M2 status post 5 cycles. The patient continues to tolerate his treatment fairly well with no concerning adverse effect except for the mild odynophagia and weight loss. I recommended for him to proceed with cycle #6 today as planned.  He is expected to complete the course of radiotherapy on August 08, 2022. I will see him back for follow-up visit in 5 weeks for evaluation with repeat CT scan of the chest for restaging of his disease. For the cough he will continue his current treatment with Tessalon. The patient was advised to call immediately if he has any other concerning symptoms in the interval. The patient voices understanding of current disease status and treatment options and is in agreement with the current care plan.  All questions were answered. The patient knows to call the clinic with any problems, questions or concerns. We can certainly see the patient much sooner if necessary.  The total  time spent in the appointment was 20 minutes.  Disclaimer: This note was dictated with voice recognition software. Similar sounding words can inadvertently be transcribed and may not be corrected upon review.

## 2022-07-25 NOTE — Progress Notes (Signed)
Patient seen by MD today  Vitals are within treatment parameters.  Labs reviewed: and are within treatment parameters.  Per physician team, patient is ready for treatment and there are NO modifications to the treatment plan.  

## 2022-07-26 ENCOUNTER — Ambulatory Visit
Admission: RE | Admit: 2022-07-26 | Discharge: 2022-07-26 | Disposition: A | Discharge status: Home / Self Care | Payer: Medicare Other | Source: Ambulatory Visit | Attending: Radiation Oncology | Admitting: Radiation Oncology

## 2022-07-26 ENCOUNTER — Other Ambulatory Visit: Payer: Self-pay

## 2022-07-26 DIAGNOSIS — R053 Chronic cough: Secondary | ICD-10-CM | POA: Diagnosis not present

## 2022-07-26 DIAGNOSIS — C3411 Malignant neoplasm of upper lobe, right bronchus or lung: Secondary | ICD-10-CM | POA: Diagnosis not present

## 2022-07-26 DIAGNOSIS — Z510 Encounter for antineoplastic radiation therapy: Secondary | ICD-10-CM | POA: Diagnosis not present

## 2022-07-26 DIAGNOSIS — Z5111 Encounter for antineoplastic chemotherapy: Secondary | ICD-10-CM | POA: Diagnosis not present

## 2022-07-26 DIAGNOSIS — Z87891 Personal history of nicotine dependence: Secondary | ICD-10-CM | POA: Diagnosis not present

## 2022-07-26 DIAGNOSIS — C3491 Malignant neoplasm of unspecified part of right bronchus or lung: Secondary | ICD-10-CM

## 2022-07-26 LAB — RAD ONC ARIA SESSION SUMMARY
Course Elapsed Days: 34
Plan Fractions Treated to Date: 24
Plan Prescribed Dose Per Fraction: 2 Gy
Plan Total Fractions Prescribed: 30
Plan Total Prescribed Dose: 60 Gy
Reference Point Dosage Given to Date: 48 Gy
Reference Point Session Dosage Given: 2 Gy
Session Number: 24

## 2022-07-27 ENCOUNTER — Other Ambulatory Visit: Payer: Self-pay

## 2022-07-27 ENCOUNTER — Ambulatory Visit
Admission: RE | Admit: 2022-07-27 | Discharge: 2022-07-27 | Disposition: A | Discharge status: Home / Self Care | Payer: Medicare Other | Source: Ambulatory Visit | Attending: Radiation Oncology | Admitting: Radiation Oncology

## 2022-07-27 DIAGNOSIS — Z5111 Encounter for antineoplastic chemotherapy: Secondary | ICD-10-CM | POA: Diagnosis not present

## 2022-07-27 DIAGNOSIS — C3411 Malignant neoplasm of upper lobe, right bronchus or lung: Secondary | ICD-10-CM | POA: Diagnosis not present

## 2022-07-27 DIAGNOSIS — R053 Chronic cough: Secondary | ICD-10-CM | POA: Diagnosis not present

## 2022-07-27 DIAGNOSIS — Z87891 Personal history of nicotine dependence: Secondary | ICD-10-CM | POA: Diagnosis not present

## 2022-07-27 DIAGNOSIS — Z510 Encounter for antineoplastic radiation therapy: Secondary | ICD-10-CM | POA: Diagnosis not present

## 2022-07-27 LAB — RAD ONC ARIA SESSION SUMMARY
Course Elapsed Days: 35
Plan Fractions Treated to Date: 25
Plan Prescribed Dose Per Fraction: 2 Gy
Plan Total Fractions Prescribed: 30
Plan Total Prescribed Dose: 60 Gy
Reference Point Dosage Given to Date: 50 Gy
Reference Point Session Dosage Given: 2 Gy
Session Number: 25

## 2022-07-28 ENCOUNTER — Other Ambulatory Visit: Payer: Self-pay

## 2022-07-28 ENCOUNTER — Ambulatory Visit
Admission: RE | Admit: 2022-07-28 | Discharge: 2022-07-28 | Disposition: A | Discharge status: Home / Self Care | Payer: Medicare Other | Source: Ambulatory Visit | Attending: Radiation Oncology | Admitting: Radiation Oncology

## 2022-07-28 DIAGNOSIS — Z510 Encounter for antineoplastic radiation therapy: Secondary | ICD-10-CM | POA: Diagnosis not present

## 2022-07-28 DIAGNOSIS — Z5111 Encounter for antineoplastic chemotherapy: Secondary | ICD-10-CM | POA: Diagnosis not present

## 2022-07-28 DIAGNOSIS — C3411 Malignant neoplasm of upper lobe, right bronchus or lung: Secondary | ICD-10-CM | POA: Diagnosis not present

## 2022-07-28 DIAGNOSIS — Z87891 Personal history of nicotine dependence: Secondary | ICD-10-CM | POA: Diagnosis not present

## 2022-07-28 DIAGNOSIS — R053 Chronic cough: Secondary | ICD-10-CM | POA: Diagnosis not present

## 2022-07-28 LAB — RAD ONC ARIA SESSION SUMMARY
Course Elapsed Days: 36
Plan Fractions Treated to Date: 26
Plan Prescribed Dose Per Fraction: 2 Gy
Plan Total Fractions Prescribed: 30
Plan Total Prescribed Dose: 60 Gy
Reference Point Dosage Given to Date: 52 Gy
Reference Point Session Dosage Given: 2 Gy
Session Number: 26

## 2022-07-29 ENCOUNTER — Other Ambulatory Visit: Payer: Self-pay

## 2022-07-29 ENCOUNTER — Ambulatory Visit
Admission: RE | Admit: 2022-07-29 | Discharge: 2022-07-29 | Disposition: A | Discharge status: Home / Self Care | Payer: Medicare Other | Source: Ambulatory Visit | Attending: Radiation Oncology | Admitting: Radiation Oncology

## 2022-07-29 DIAGNOSIS — R053 Chronic cough: Secondary | ICD-10-CM | POA: Diagnosis not present

## 2022-07-29 DIAGNOSIS — Z510 Encounter for antineoplastic radiation therapy: Secondary | ICD-10-CM | POA: Diagnosis not present

## 2022-07-29 DIAGNOSIS — Z5111 Encounter for antineoplastic chemotherapy: Secondary | ICD-10-CM | POA: Diagnosis not present

## 2022-07-29 DIAGNOSIS — C3411 Malignant neoplasm of upper lobe, right bronchus or lung: Secondary | ICD-10-CM | POA: Diagnosis not present

## 2022-07-29 DIAGNOSIS — Z87891 Personal history of nicotine dependence: Secondary | ICD-10-CM | POA: Diagnosis not present

## 2022-07-29 LAB — RAD ONC ARIA SESSION SUMMARY
Course Elapsed Days: 37
Plan Fractions Treated to Date: 27
Plan Prescribed Dose Per Fraction: 2 Gy
Plan Total Fractions Prescribed: 30
Plan Total Prescribed Dose: 60 Gy
Reference Point Dosage Given to Date: 54 Gy
Reference Point Session Dosage Given: 2 Gy
Session Number: 27

## 2022-07-29 MED FILL — Dexamethasone Sodium Phosphate Inj 100 MG/10ML: INTRAMUSCULAR | Qty: 1 | Status: AC

## 2022-08-01 ENCOUNTER — Inpatient Hospital Stay: Payer: Medicare Other

## 2022-08-01 ENCOUNTER — Other Ambulatory Visit: Payer: Self-pay

## 2022-08-01 ENCOUNTER — Ambulatory Visit
Admission: RE | Admit: 2022-08-01 | Discharge: 2022-08-01 | Disposition: A | Discharge status: Home / Self Care | Payer: Medicare Other | Source: Ambulatory Visit | Attending: Radiation Oncology | Admitting: Radiation Oncology

## 2022-08-01 ENCOUNTER — Other Ambulatory Visit: Payer: Medicare Other

## 2022-08-01 VITALS — BP 150/73 | HR 78 | Temp 98.4°F | Resp 16 | Wt 128.2 lb

## 2022-08-01 DIAGNOSIS — C3491 Malignant neoplasm of unspecified part of right bronchus or lung: Secondary | ICD-10-CM

## 2022-08-01 DIAGNOSIS — Z95828 Presence of other vascular implants and grafts: Secondary | ICD-10-CM

## 2022-08-01 DIAGNOSIS — R053 Chronic cough: Secondary | ICD-10-CM | POA: Diagnosis not present

## 2022-08-01 DIAGNOSIS — D509 Iron deficiency anemia, unspecified: Secondary | ICD-10-CM

## 2022-08-01 DIAGNOSIS — C3411 Malignant neoplasm of upper lobe, right bronchus or lung: Secondary | ICD-10-CM | POA: Diagnosis not present

## 2022-08-01 DIAGNOSIS — Z510 Encounter for antineoplastic radiation therapy: Secondary | ICD-10-CM | POA: Diagnosis not present

## 2022-08-01 DIAGNOSIS — Z5111 Encounter for antineoplastic chemotherapy: Secondary | ICD-10-CM | POA: Diagnosis not present

## 2022-08-01 DIAGNOSIS — Z87891 Personal history of nicotine dependence: Secondary | ICD-10-CM | POA: Diagnosis not present

## 2022-08-01 LAB — CBC WITH DIFFERENTIAL (CANCER CENTER ONLY)
Abs Immature Granulocytes: 0.02 10*3/uL (ref 0.00–0.07)
Basophils Absolute: 0 10*3/uL (ref 0.0–0.1)
Basophils Relative: 1 %
Eosinophils Absolute: 0.1 10*3/uL (ref 0.0–0.5)
Eosinophils Relative: 2 %
HCT: 25.7 % — ABNORMAL LOW (ref 39.0–52.0)
Hemoglobin: 8.4 g/dL — ABNORMAL LOW (ref 13.0–17.0)
Immature Granulocytes: 1 %
Lymphocytes Relative: 12 %
Lymphs Abs: 0.5 10*3/uL — ABNORMAL LOW (ref 0.7–4.0)
MCH: 27.5 pg (ref 26.0–34.0)
MCHC: 32.7 g/dL (ref 30.0–36.0)
MCV: 84.3 fL (ref 80.0–100.0)
Monocytes Absolute: 0.6 10*3/uL (ref 0.1–1.0)
Monocytes Relative: 16 %
Neutro Abs: 2.8 10*3/uL (ref 1.7–7.7)
Neutrophils Relative %: 68 %
Platelet Count: 203 10*3/uL (ref 150–400)
RBC: 3.05 MIL/uL — ABNORMAL LOW (ref 4.22–5.81)
RDW: 25.8 % — ABNORMAL HIGH (ref 11.5–15.5)
WBC Count: 4 10*3/uL (ref 4.0–10.5)
nRBC: 0.5 % — ABNORMAL HIGH (ref 0.0–0.2)

## 2022-08-01 LAB — RAD ONC ARIA SESSION SUMMARY
Course Elapsed Days: 40
Plan Fractions Treated to Date: 28
Plan Prescribed Dose Per Fraction: 2 Gy
Plan Total Fractions Prescribed: 30
Plan Total Prescribed Dose: 60 Gy
Reference Point Dosage Given to Date: 56 Gy
Reference Point Session Dosage Given: 2 Gy
Session Number: 28

## 2022-08-01 LAB — CMP (CANCER CENTER ONLY)
ALT: 13 U/L (ref 0–44)
AST: 17 U/L (ref 15–41)
Albumin: 3.4 g/dL — ABNORMAL LOW (ref 3.5–5.0)
Alkaline Phosphatase: 56 U/L (ref 38–126)
Anion gap: 7 (ref 5–15)
BUN: 16 mg/dL (ref 8–23)
CO2: 25 mmol/L (ref 22–32)
Calcium: 9.1 mg/dL (ref 8.9–10.3)
Chloride: 104 mmol/L (ref 98–111)
Creatinine: 0.72 mg/dL (ref 0.61–1.24)
GFR, Estimated: >60 mL/min (ref >60)
Glucose, Bld: 103 mg/dL — ABNORMAL HIGH (ref 70–99)
Potassium: 3.9 mmol/L (ref 3.5–5.1)
Sodium: 136 mmol/L (ref 135–145)
Total Bilirubin: 0.3 mg/dL (ref 0.3–1.2)
Total Protein: 7.1 g/dL (ref 6.5–8.1)

## 2022-08-01 LAB — SAMPLE TO BLOOD BANK

## 2022-08-01 MED ORDER — SODIUM CHLORIDE 0.9% FLUSH
10.0000 mL | INTRAVENOUS | Status: DC | PRN
  Administered 2022-08-01: 10 mL

## 2022-08-01 MED ORDER — SODIUM CHLORIDE 0.9 % IV SOLN
45.0000 mg/m2 | Freq: Once | INTRAVENOUS | Status: AC
  Administered 2022-08-01: 78 mg via INTRAVENOUS
  Filled 2022-08-01: qty 13

## 2022-08-01 MED ORDER — PALONOSETRON HCL INJECTION 0.25 MG/5ML
0.2500 mg | Freq: Once | INTRAVENOUS | Status: AC
  Administered 2022-08-01: 0.25 mg via INTRAVENOUS
  Filled 2022-08-01: qty 5

## 2022-08-01 MED ORDER — SODIUM CHLORIDE 0.9 % IV SOLN: Freq: Once | INTRAVENOUS | Status: AC

## 2022-08-01 MED ORDER — CETIRIZINE HCL 10 MG/ML IV SOLN
10.0000 mg | Freq: Once | INTRAVENOUS | Status: AC
  Administered 2022-08-01: 10 mg via INTRAVENOUS
  Filled 2022-08-01: qty 1

## 2022-08-01 MED ORDER — SODIUM CHLORIDE 0.9 % IV SOLN
10.0000 mg | Freq: Once | INTRAVENOUS | Status: AC
  Administered 2022-08-01: 10 mg via INTRAVENOUS
  Filled 2022-08-01: qty 10

## 2022-08-01 MED ORDER — HEPARIN SOD (PORK) LOCK FLUSH 100 UNIT/ML IV SOLN
500.0000 [IU] | Freq: Once | INTRAVENOUS | Status: AC | PRN
  Administered 2022-08-01: 500 [IU]

## 2022-08-01 MED ORDER — FAMOTIDINE IN NACL 20-0.9 MG/50ML-% IV SOLN
20.0000 mg | Freq: Once | INTRAVENOUS | Status: AC
  Administered 2022-08-01: 20 mg via INTRAVENOUS
  Filled 2022-08-01: qty 50

## 2022-08-01 MED ORDER — SODIUM CHLORIDE 0.9% FLUSH
10.0000 mL | Freq: Once | INTRAVENOUS | Status: AC
  Administered 2022-08-01: 10 mL

## 2022-08-01 MED ORDER — SODIUM CHLORIDE 0.9 % IV SOLN
153.0000 mg | Freq: Once | INTRAVENOUS | Status: AC
  Administered 2022-08-01: 150 mg via INTRAVENOUS
  Filled 2022-08-01: qty 15

## 2022-08-01 NOTE — Patient Instructions (Signed)
[facility]  Discharge Instructions: Thank you for choosing [facility] to provide your oncology and hematology care.   If you have a lab appointment with the Cancer Center, please go directly to the Cancer Center and check in at the registration area.   Wear comfortable clothing and clothing appropriate for easy access to any Portacath or PICC line.   We strive to give you quality time with your provider. You may need to reschedule your appointment if you arrive late (15 or more minutes).  Arriving late affects you and other patients whose appointments are after yours.  Also, if you miss three or more appointments without notifying the office, you may be dismissed from the clinic at the provider's discretion.      For prescription refill requests, have your pharmacy contact our office and allow 72 hours for refills to be completed.    Today you received the following chemotherapy and/or immunotherapy agents: Paclitaxel, Carboplatin.       To help prevent nausea and vomiting after your treatment, we encourage you to take your nausea medication as directed.  BELOW ARE SYMPTOMS THAT SHOULD BE REPORTED IMMEDIATELY: *FEVER GREATER THAN 100.4 F (38 C) OR HIGHER *CHILLS OR SWEATING *NAUSEA AND VOMITING THAT IS NOT CONTROLLED WITH YOUR NAUSEA MEDICATION *UNUSUAL SHORTNESS OF BREATH *UNUSUAL BRUISING OR BLEEDING *URINARY PROBLEMS (pain or burning when urinating, or frequent urination) *BOWEL PROBLEMS (unusual diarrhea, constipation, pain near the anus) TENDERNESS IN MOUTH AND THROAT WITH OR WITHOUT PRESENCE OF ULCERS (sore throat, sores in mouth, or a toothache) UNUSUAL RASH, SWELLING OR PAIN  UNUSUAL VAGINAL DISCHARGE OR ITCHING   Items with * indicate a potential emergency and should be followed up as soon as possible or go to the Emergency Department if any problems should occur.  Please show the CHEMOTHERAPY ALERT CARD or IMMUNOTHERAPY  ALERT CARD at check-in to the Emergency Department and triage nurse.  Should you have questions after your visit or need to cancel or reschedule your appointment, please contact [facility]  Dept: [phone]  and follow the prompts.  Office hours are 8:00 a.m. to 4:30 p.m. Monday - Friday. Please note that voicemails left after 4:00 p.m. may not be returned until the following business day.  We are closed weekends and major holidays. You have access to a nurse at all times for urgent questions. Please call the main number to the clinic Dept: [phone] and follow the prompts.   For any non-urgent questions, you may also contact your provider using MyChart. We now offer e-Visits for anyone 18 and older to request care online for non-urgent symptoms. For details visit [email or web address].   Also download the MyChart app! Go to the app store, search "MyChart", open the app, select , and log in with your MyChart username and password.   

## 2022-08-02 ENCOUNTER — Ambulatory Visit
Admission: RE | Admit: 2022-08-02 | Discharge: 2022-08-02 | Disposition: A | Discharge status: Home / Self Care | Payer: Medicare Other | Source: Ambulatory Visit | Attending: Radiation Oncology | Admitting: Radiation Oncology

## 2022-08-02 ENCOUNTER — Other Ambulatory Visit: Payer: Self-pay

## 2022-08-02 DIAGNOSIS — Z87891 Personal history of nicotine dependence: Secondary | ICD-10-CM | POA: Diagnosis not present

## 2022-08-02 DIAGNOSIS — Z5111 Encounter for antineoplastic chemotherapy: Secondary | ICD-10-CM | POA: Diagnosis not present

## 2022-08-02 DIAGNOSIS — C3411 Malignant neoplasm of upper lobe, right bronchus or lung: Secondary | ICD-10-CM | POA: Diagnosis not present

## 2022-08-02 DIAGNOSIS — Z510 Encounter for antineoplastic radiation therapy: Secondary | ICD-10-CM | POA: Diagnosis not present

## 2022-08-02 DIAGNOSIS — R053 Chronic cough: Secondary | ICD-10-CM | POA: Diagnosis not present

## 2022-08-02 LAB — RAD ONC ARIA SESSION SUMMARY
Course Elapsed Days: 41
Plan Fractions Treated to Date: 29
Plan Prescribed Dose Per Fraction: 2 Gy
Plan Total Fractions Prescribed: 30
Plan Total Prescribed Dose: 60 Gy
Reference Point Dosage Given to Date: 58 Gy
Reference Point Session Dosage Given: 2 Gy
Session Number: 29

## 2022-08-03 ENCOUNTER — Ambulatory Visit
Admission: RE | Admit: 2022-08-03 | Discharge: 2022-08-03 | Disposition: A | Discharge status: Home / Self Care | Payer: Medicare Other | Source: Ambulatory Visit | Attending: Radiation Oncology | Admitting: Radiation Oncology

## 2022-08-03 ENCOUNTER — Other Ambulatory Visit: Payer: Self-pay

## 2022-08-03 ENCOUNTER — Ambulatory Visit: Payer: Medicare Other

## 2022-08-03 DIAGNOSIS — C3411 Malignant neoplasm of upper lobe, right bronchus or lung: Secondary | ICD-10-CM | POA: Diagnosis not present

## 2022-08-03 DIAGNOSIS — R053 Chronic cough: Secondary | ICD-10-CM | POA: Diagnosis not present

## 2022-08-03 DIAGNOSIS — Z87891 Personal history of nicotine dependence: Secondary | ICD-10-CM | POA: Diagnosis not present

## 2022-08-03 DIAGNOSIS — Z510 Encounter for antineoplastic radiation therapy: Secondary | ICD-10-CM | POA: Diagnosis not present

## 2022-08-03 DIAGNOSIS — Z5111 Encounter for antineoplastic chemotherapy: Secondary | ICD-10-CM | POA: Diagnosis not present

## 2022-08-03 LAB — RAD ONC ARIA SESSION SUMMARY
Course Elapsed Days: 42
Plan Fractions Treated to Date: 30
Plan Prescribed Dose Per Fraction: 2 Gy
Plan Total Fractions Prescribed: 30
Plan Total Prescribed Dose: 60 Gy
Reference Point Dosage Given to Date: 60 Gy
Reference Point Session Dosage Given: 2 Gy
Session Number: 30

## 2022-08-04 ENCOUNTER — Other Ambulatory Visit: Payer: Self-pay

## 2022-08-04 ENCOUNTER — Ambulatory Visit
Admission: RE | Admit: 2022-08-04 | Discharge: 2022-08-04 | Disposition: A | Discharge status: Home / Self Care | Payer: Medicare Other | Source: Ambulatory Visit | Attending: Radiation Oncology | Admitting: Radiation Oncology

## 2022-08-04 ENCOUNTER — Ambulatory Visit: Payer: Medicare Other

## 2022-08-04 DIAGNOSIS — Z510 Encounter for antineoplastic radiation therapy: Secondary | ICD-10-CM | POA: Diagnosis not present

## 2022-08-04 DIAGNOSIS — R053 Chronic cough: Secondary | ICD-10-CM | POA: Diagnosis not present

## 2022-08-04 DIAGNOSIS — C3411 Malignant neoplasm of upper lobe, right bronchus or lung: Secondary | ICD-10-CM | POA: Diagnosis not present

## 2022-08-04 DIAGNOSIS — Z87891 Personal history of nicotine dependence: Secondary | ICD-10-CM | POA: Diagnosis not present

## 2022-08-04 DIAGNOSIS — Z5111 Encounter for antineoplastic chemotherapy: Secondary | ICD-10-CM | POA: Diagnosis not present

## 2022-08-04 LAB — RAD ONC ARIA SESSION SUMMARY
Course Elapsed Days: 43
Plan Fractions Treated to Date: 1
Plan Prescribed Dose Per Fraction: 2 Gy
Plan Total Fractions Prescribed: 3
Plan Total Prescribed Dose: 6 Gy
Reference Point Dosage Given to Date: 2 Gy
Reference Point Session Dosage Given: 2 Gy
Session Number: 31

## 2022-08-05 ENCOUNTER — Ambulatory Visit: Payer: Medicare Other

## 2022-08-05 ENCOUNTER — Ambulatory Visit
Admission: RE | Admit: 2022-08-05 | Discharge: 2022-08-05 | Disposition: A | Discharge status: Home / Self Care | Payer: Medicare Other | Source: Ambulatory Visit | Attending: Radiation Oncology | Admitting: Radiation Oncology

## 2022-08-05 ENCOUNTER — Other Ambulatory Visit: Payer: Self-pay

## 2022-08-05 ENCOUNTER — Telehealth: Payer: Self-pay | Admitting: Internal Medicine

## 2022-08-05 DIAGNOSIS — Z510 Encounter for antineoplastic radiation therapy: Secondary | ICD-10-CM | POA: Diagnosis not present

## 2022-08-05 DIAGNOSIS — R053 Chronic cough: Secondary | ICD-10-CM | POA: Diagnosis not present

## 2022-08-05 DIAGNOSIS — C3411 Malignant neoplasm of upper lobe, right bronchus or lung: Secondary | ICD-10-CM | POA: Diagnosis not present

## 2022-08-05 DIAGNOSIS — Z5111 Encounter for antineoplastic chemotherapy: Secondary | ICD-10-CM | POA: Diagnosis not present

## 2022-08-05 LAB — RAD ONC ARIA SESSION SUMMARY
Course Elapsed Days: 44
Plan Fractions Treated to Date: 2
Plan Prescribed Dose Per Fraction: 2 Gy
Plan Total Fractions Prescribed: 3
Plan Total Prescribed Dose: 6 Gy
Reference Point Dosage Given to Date: 4 Gy
Reference Point Session Dosage Given: 2 Gy
Session Number: 32

## 2022-08-05 NOTE — Telephone Encounter (Signed)
Called patient regarding upcoming April appointments, patient is notified. 

## 2022-08-08 ENCOUNTER — Ambulatory Visit: Payer: Medicare Other

## 2022-08-08 ENCOUNTER — Other Ambulatory Visit: Payer: Self-pay

## 2022-08-08 ENCOUNTER — Ambulatory Visit
Admission: RE | Admit: 2022-08-08 | Discharge: 2022-08-08 | Disposition: A | Discharge status: Home / Self Care | Payer: Medicare Other | Source: Ambulatory Visit | Attending: Radiation Oncology | Admitting: Radiation Oncology

## 2022-08-08 DIAGNOSIS — Z5111 Encounter for antineoplastic chemotherapy: Secondary | ICD-10-CM | POA: Insufficient documentation

## 2022-08-08 DIAGNOSIS — Z510 Encounter for antineoplastic radiation therapy: Secondary | ICD-10-CM | POA: Diagnosis not present

## 2022-08-08 DIAGNOSIS — Z87891 Personal history of nicotine dependence: Secondary | ICD-10-CM | POA: Diagnosis not present

## 2022-08-08 DIAGNOSIS — C3411 Malignant neoplasm of upper lobe, right bronchus or lung: Secondary | ICD-10-CM | POA: Insufficient documentation

## 2022-08-08 DIAGNOSIS — R053 Chronic cough: Secondary | ICD-10-CM | POA: Insufficient documentation

## 2022-08-08 LAB — RAD ONC ARIA SESSION SUMMARY
Course Elapsed Days: 47
Plan Fractions Treated to Date: 3
Plan Prescribed Dose Per Fraction: 2 Gy
Plan Total Fractions Prescribed: 3
Plan Total Prescribed Dose: 6 Gy
Reference Point Dosage Given to Date: 6 Gy
Reference Point Session Dosage Given: 2 Gy
Session Number: 33

## 2022-08-10 NOTE — Radiation Completion Notes (Signed)
Patient Name: [name], [name] MRN: [id number] Date of Birth: 1940-10-07 Referring Physician: Curt Bears, M.D. Date of Service: 2022-08-10 Radiation Oncologist: Teryl Lucy, M.D. [place] END OF TREATMENT NOTE     Diagnosis: C34.11 Malignant neoplasm of upper lobe, right bronchus or lung Intent: Curative     ==========DELIVERED PLANS==========  First Treatment Date: 2022-06-22 - Last Treatment Date: 2022-08-08   Plan Name: Lung_R Site: Lung, Right Technique: 3D Mode: Photon Dose Per Fraction: 2 Gy Prescribed Dose (Delivered / Prescribed): 60 Gy / 60 Gy Prescribed Fxs (Delivered / Prescribed): 30 / 30   Plan Name: Lung_R_Bst Site: Lung, Right Technique: 3D Mode: Photon Dose Per Fraction: 2 Gy Prescribed Dose (Delivered / Prescribed): 6 Gy / 6 Gy Prescribed Fxs (Delivered / Prescribed): 3 / 3     ==========ON TREATMENT VISIT DATES========== 2022-06-28, 2022-07-05, 2022-07-12, 2022-07-19, 2022-07-26, 2022-08-02, 2022-08-08     ==========UPCOMING VISITS========== 2022-11-14 [facility] Jerline Pain, MD; Lujean Amel, MD        ==========APPENDIX - ON TREATMENT VISIT NOTES==========   See weekly On Treatment Notes is Epic for details.

## 2022-08-17 DIAGNOSIS — H524 Presbyopia: Secondary | ICD-10-CM | POA: Diagnosis not present

## 2022-08-17 DIAGNOSIS — H2513 Age-related nuclear cataract, bilateral: Secondary | ICD-10-CM | POA: Diagnosis not present

## 2022-08-17 DIAGNOSIS — E119 Type 2 diabetes mellitus without complications: Secondary | ICD-10-CM | POA: Diagnosis not present

## 2022-08-24 DIAGNOSIS — E440 Moderate protein-calorie malnutrition: Secondary | ICD-10-CM | POA: Diagnosis not present

## 2022-08-24 DIAGNOSIS — I700 Atherosclerosis of aorta: Secondary | ICD-10-CM | POA: Diagnosis not present

## 2022-08-24 DIAGNOSIS — I10 Essential (primary) hypertension: Secondary | ICD-10-CM | POA: Diagnosis not present

## 2022-08-24 DIAGNOSIS — Z952 Presence of prosthetic heart valve: Secondary | ICD-10-CM | POA: Diagnosis not present

## 2022-08-24 DIAGNOSIS — E1169 Type 2 diabetes mellitus with other specified complication: Secondary | ICD-10-CM | POA: Diagnosis not present

## 2022-08-24 DIAGNOSIS — J432 Centrilobular emphysema: Secondary | ICD-10-CM | POA: Diagnosis not present

## 2022-08-24 DIAGNOSIS — C3490 Malignant neoplasm of unspecified part of unspecified bronchus or lung: Secondary | ICD-10-CM | POA: Diagnosis not present

## 2022-08-24 DIAGNOSIS — R1310 Dysphagia, unspecified: Secondary | ICD-10-CM | POA: Diagnosis not present

## 2022-08-25 ENCOUNTER — Encounter: Payer: Self-pay | Admitting: Cardiology

## 2022-08-25 ENCOUNTER — Ambulatory Visit: Payer: Medicare Other | Attending: Cardiology | Admitting: Cardiology

## 2022-08-25 VITALS — BP 114/52 | HR 61 | Ht 71.0 in | Wt 128.0 lb

## 2022-08-25 DIAGNOSIS — C220 Liver cell carcinoma: Secondary | ICD-10-CM

## 2022-08-25 DIAGNOSIS — C3491 Malignant neoplasm of unspecified part of right bronchus or lung: Secondary | ICD-10-CM | POA: Diagnosis not present

## 2022-08-25 DIAGNOSIS — Z952 Presence of prosthetic heart valve: Secondary | ICD-10-CM | POA: Diagnosis not present

## 2022-08-25 NOTE — Patient Instructions (Signed)
Medication Instructions:  The current medical regimen is effective;  continue present plan and medications.  *If you need a refill on your cardiac medications before your next appointment, please call your pharmacy*  Follow-Up: At [facility], you and your health needs are our priority.  As part of our continuing mission to provide you with exceptional heart care, we have created designated Provider Care Teams.  These Care Teams include your primary Cardiologist (physician) and Advanced Practice Providers (APPs -  Physician Assistants and Nurse Practitioners) who all work together to provide you with the care you need, when you need it.  We recommend signing up for the patient portal called "MyChart".  Sign up information is provided on this After Visit Summary.  MyChart is used to connect with patients for Virtual Visits (Telemedicine).  Patients are able to view lab/test results, encounter notes, upcoming appointments, etc.  Non-urgent messages can be sent to your provider as well.   To learn more about what you can do with MyChart, go to [email or web address].    Your next appointment:   1 year(s)  Provider:   Mark Skains, MD      

## 2022-08-25 NOTE — Progress Notes (Signed)
Cardiology Office Note:    Date:  08/25/2022   ID:  [name], DOB [birth year], MRN [id number]  PCP:  Darrow Bussing, MD   [facility] Providers Cardiologist:  Donato Schultz, MD     Referring MD: Darrow Bussing, MD    History of Present Illness:    [name] is a 82 y.o. male here for the follow-up of aortic valve replacement, bioprosthetic 2015, hepatocellular carcinoma previously treated hepatitis C and bilateral carotid artery disease which is mild.  Has been diagnosed with stage IIIa non-small cell lung cancer followed by Dr. Shirline Frees  Squamous cell carcinoma.  Had a large central necrotic right upper lobe mass.  Has undergone chemoradiation.  Has lack of energy.  Has increasing cough.  No hemoptysis.  Has been thin difficulty maintaining his weight.  Drinks Ensure.  Previously we had stopped his metoprolol because of disequilibrium when he stood up too quickly.  He takes dental prophylaxis.  No chest pain no fevers chills nausea vomiting syncope.    Past Medical History:  Diagnosis Date   Acute epididymo-orchitis    Aortic stenosis    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   Arthritis    Diabetes mellitus without complication    ED (erectile dysfunction)    Essential hypertension, benign    Heart murmur    Hep C w/o coma, chronic    History of blood transfusion    History of DVT of lower extremity    Hx of echocardiogram    Echo (8/15):  Severe LVH, EF 60-65%, no RWMA, Gr 3 DD, AVR ok (mean 21 mmHg), MAC, mild LAE, mild to mod RAE   Hypercholesterolemia    PONV (postoperative nausea and vomiting)     Past Surgical History:  Procedure Laterality Date   AORTIC VALVE REPLACEMENT N/A 11/19/2013   Procedure: AORTIC VALVE REPLACEMENT (AVR);  Surgeon: Alleen Borne, MD;  Location[facility] OR;  Service: Open Heart Surgery;  Laterality: N/A;   BUNIONECTOMY Bilateral 05/09/2006   CARDIAC CATHETERIZATION  10/01/2013   COLONOSCOPY  01/07/2010   internal  hemorrhoids.  Dr Arlyce Dice   ESOPHAGOGASTRODUODENOSCOPY N/A 04/24/2013   Procedure: ESOPHAGOGASTRODUODENOSCOPY (EGD);  Surgeon: Beverley Fiedler, MD;  Location: [facility] ENDOSCOPY;  Service: Endoscopy;  Laterality: N/A;   INGUINAL HERNIA REPAIR Right    INTRAOPERATIVE TRANSESOPHAGEAL ECHOCARDIOGRAM N/A 11/19/2013   Procedure: INTRAOPERATIVE TRANSESOPHAGEAL ECHOCARDIOGRAM;  Surgeon: Alleen Borne, MD;  Location: [facility] OR;  Service: Open Heart Surgery;  Laterality: N/A;   IR IMAGING GUIDED PORT INSERTION  06/30/2022   KNEE ARTHROSCOPY Left 05/09/2009   Dr Leslee Home   LAMINECTOMY  06/09/2005   Dr Jeral Fruit. multiple lumbar level laminectomies.    LEFT AND RIGHT HEART CATHETERIZATION WITH CORONARY ANGIOGRAM N/A 10/01/2013   Procedure: LEFT AND RIGHT HEART CATHETERIZATION WITH CORONARY ANGIOGRAM;  Surgeon: Donato Schultz, MD;  Location: [facility] CATH LAB;  Service: Cardiovascular;  Laterality: N/A;   SPINAL FUSION     lumbar   TEE WITHOUT CARDIOVERSION  11/07/2003   Aoritic valve sclerosis.    TONSILLECTOMY     VIDEO BRONCHOSCOPY WITH ENDOBRONCHIAL ULTRASOUND N/A 06/02/2022   Procedure: VIDEO BRONCHOSCOPY WITH ENDOBRONCHIAL ULTRASOUND;  Surgeon: Luciano Cutter, MD;  Location: [facility] OR;  Service: Thoracic;  Laterality: N/A;    Current Medications: Current Meds  Medication Sig   acetaminophen (TYLENOL) 500 MG tablet Take 500 mg by mouth every 6 (six) hours as needed for mild pain or moderate pain.   amLODipine (NORVASC) 10 MG tablet  Take 1 tablet (10 mg total) by mouth daily.   amoxicillin (AMOXIL) 500 MG capsule Take 2,000 mg by mouth.  4 pills before dental procedure for 1 days   aspirin EC 81 MG EC tablet Take 1 tablet (81 mg total) by mouth daily.   atorvastatin (LIPITOR) 20 MG tablet Take 20 mg by mouth daily.   benzonatate (TESSALON) 100 MG capsule Take 1 capsule (100 mg total) by mouth 3 (three) times daily as needed for cough.   dextromethorphan-guaiFENesin (MUCINEX DM) 30-600 MG 12hr tablet Take 1 tablet  by mouth 2 (two) times daily as needed for cough.   fluticasone-salmeterol (ADVAIR HFA) 115-21 MCG/ACT inhaler Inhale 2 puffs into the lungs 2 (two) times daily.   glucose blood test strip    glucose blood test strip as directed DX 250   hydrochlorothiazide (HYDRODIURIL) 12.5 MG tablet Take 1 tablet (12.5 mg total) by mouth daily.   lidocaine-prilocaine (EMLA) cream Apply 1 Application topically as needed.   metFORMIN (GLUCOPHAGE) 1000 MG tablet Take 1,000 mg by mouth 2 (two) times daily with a meal.   Multiple Vitamin (MULTIVITAMIN WITH MINERALS) TABS tablet Take 1 tablet by mouth daily.   prochlorperazine (COMPAZINE) 10 MG tablet Take 1 tablet (10 mg total) by mouth every 6 (six) hours as needed.   SF 5000 PLUS 1.1 % CREA dental cream Place 1 Application onto teeth daily.   sucralfate (CARAFATE) 1 g tablet Take 1 tablet (1 g total) by mouth 4 (four) times daily -  with meals and at bedtime. Crush and dissolve in 10 mL of warm water, swallow 20 min before meals   tamsulosin (FLOMAX) 0.4 MG CAPS capsule Take 0.4 mg by mouth daily.     Allergies:   Azithromycin, Lisinopril, Ezetimibe-simvastatin, Metoprolol, and Rosuvastatin   Social History   Socioeconomic History   Marital status: Married    Spouse name: Not on file   Number of children: Not on file   Years of education: Not on file   Highest education level: Not on file  Occupational History   Not on file  Tobacco Use   Smoking status: Former    Packs/day: 1.50    Years: 31.00    Additional pack years: 0.00    Total pack years: 46.50    Types: Cigarettes    Quit date: 47    Years since quitting: 31.3   Smokeless tobacco: Never  Vaping Use   Vaping Use: Never used  Substance and Sexual Activity   Alcohol use: No   Drug use: No   Sexual activity: Not on file  Other Topics Concern   Not on file  Social History Narrative   Not on file   Social Determinants of Health   Financial Resource Strain: Not on file  Food  Insecurity: No Food Insecurity (06/04/2022)   Hunger Vital Sign    Worried About Running Out of Food in the Last Year: Never true    Ran Out of Food in the Last Year: Never true  Transportation Needs: No Transportation Needs (06/04/2022)   PRAPARE - Administrator, Civil Service (Medical): No    Lack of Transportation (Non-Medical): No  Physical Activity: Not on file  Stress: Not on file  Social Connections: Not on file     Family History: The patient's family history includes Diabetes in his father and mother; Hypertension in his unknown relative; Stroke in his father and mother. There is no history of Heart attack.  ROS:   Please see the history of present illness.     All other systems reviewed and are negative.  EKGs/Labs/Other Studies Reviewed:    The following studies were reviewed today:  Echocardiogram 06/04/2020-Normal pump function.  Mitral annular calcification noted.  Normal functioning bioprosthetic aortic valve.   Carotid Doppler 11/14/2013-mild disease bilaterally  Cardiac Studies & Procedures       ECHOCARDIOGRAM  ECHOCARDIOGRAM COMPLETE 06/17/2021  Narrative ECHOCARDIOGRAM REPORT    Patient Name:   [name] Date of Exam: 06/17/2021 Medical Rec #:  [id number]      Height:       71.0 in Accession #:    [id number]     Weight:       138.0 lb Date of Birth:  1940-12-29       BSA:          1.801 m Patient Age:    80 years       BP:           150/70 mmHg Patient Gender: M              HR:           75 bpm. Exam Location:  [address]  Procedure: 2D Echo, Cardiac Doppler and Color Doppler  Indications:    R01.1 Murmur  History:        Patient has prior history of Echocardiogram examinations, most recent 06/04/2020. Aortic valve replacement-July 2015., Signs/Symptoms:Murmur; Risk Factors:Hypertension, Diabetes and Dyslipidemia. DVT.  Sonographer:    Sedonia Small Rodgers-Jones RDCS Referring Phys: [id number] Paletta C Cumi Sanagustin  IMPRESSIONS   1. Left  ventricular ejection fraction, by estimation, is 70 to 75%. The left ventricle has hyperdynamic function. The left ventricle has no regional wall motion abnormalities. There is mild concentric left ventricular hypertrophy. Left ventricular diastolic parameters are consistent with Grade I diastolic dysfunction (impaired relaxation). Elevated left ventricular end-diastolic pressure. 2. Right ventricular systolic function is normal. The right ventricular size is normal. There is normal pulmonary artery systolic pressure. The estimated right ventricular systolic pressure is 30.7 mmHg. 3. Left atrial size was mildly dilated. 4. The mitral valve is degenerative. Trivial mitral valve regurgitation. No evidence of mitral stenosis. Severe mitral annular calcification. 5. The aortic valve has been repaired/replaced. There is a 21 mm Medtronic Magna Ease Pericardial valve present in the aortic position. Aortic valve regurgitation is not visualized. Aortic valve mean gradient measures 13.2 mmHg. Aortic valve Vmax measures 2.39 m/s. 6. The inferior vena cava is normal in size with greater than 50% respiratory variability, suggesting right atrial pressure of 3 mmHg. 7. Compared to echo 06/04/2020, the mean AVG is unchanged  FINDINGS Left Ventricle: Left ventricular ejection fraction, by estimation, is 70 to 75%. The left ventricle has hyperdynamic function. The left ventricle has no regional wall motion abnormalities. The left ventricular internal cavity size was normal in size. There is mild concentric left ventricular hypertrophy. Left ventricular diastolic parameters are consistent with Grade I diastolic dysfunction (impaired relaxation). Elevated left ventricular end-diastolic pressure.  Right Ventricle: The right ventricular size is normal. No increase in right ventricular wall thickness. Right ventricular systolic function is normal. There is normal pulmonary artery systolic pressure. The tricuspid regurgitant  velocity is 2.63 m/s, and with an assumed right atrial pressure of 3 mmHg, the estimated right ventricular systolic pressure is 30.7 mmHg.  Left Atrium: Left atrial size was mildly dilated.  Right Atrium: Right atrial size was normal in size.  Pericardium: There is no  evidence of pericardial effusion.  Mitral Valve: The mitral valve is degenerative in appearance. There is mild calcification of the mitral valve leaflet(s). Severe mitral annular calcification. Trivial mitral valve regurgitation. No evidence of mitral valve stenosis.  Tricuspid Valve: The tricuspid valve is normal in structure. Tricuspid valve regurgitation is trivial. No evidence of tricuspid stenosis.  Aortic Valve: The aortic valve has been repaired/replaced. Aortic valve regurgitation is not visualized. Aortic valve mean gradient measures 13.2 mmHg. Aortic valve peak gradient measures 22.8 mmHg. There is a 21 mm Medtronic Magna Ease Pericardial valve present in the aortic position.  Pulmonic Valve: The pulmonic valve was normal in structure. Pulmonic valve regurgitation is mild. No evidence of pulmonic stenosis.  Aorta: The aortic root is normal in size and structure.  Venous: The inferior vena cava is normal in size with greater than 50% respiratory variability, suggesting right atrial pressure of 3 mmHg.  IAS/Shunts: No atrial level shunt detected by color flow Doppler.   LEFT VENTRICLE PLAX 2D LVIDd:         4.10 cm Diastology LVIDs:         2.30 cm LV e' medial:    6.42 cm/s LV PW:         1.20 cm LV E/e' medial:  15.7 LV IVS:        1.20 cm LV e' lateral:   7.07 cm/s LV E/e' lateral: 14.3   RIGHT VENTRICLE             IVC RV Basal diam:  3.90 cm     IVC diam: 1.30 cm RV S prime:     14.25 cm/s TAPSE (M-mode): 1.6 cm  LEFT ATRIUM             Index        RIGHT ATRIUM           Index LA diam:        4.60 cm 2.55 cm/m   RA Area:     12.60 cm LA Vol (A2C):   96.6 ml 53.64 ml/m  RA Volume:   29.80 ml   16.55 ml/m LA Vol (A4C):   41.1 ml 22.82 ml/m LA Biplane Vol: 67.3 ml 37.37 ml/m AORTIC VALVE AV Vmax:           239.00 cm/s AV Vmean:          168.400 cm/s AV VTI:            0.472 m AV Peak Grad:      22.8 mmHg AV Mean Grad:      13.2 mmHg LVOT Vmax:         149.50 cm/s LVOT Vmean:        104.000 cm/s LVOT VTI:          0.285 m LVOT/AV VTI ratio: 0.60  AORTA Ao Root diam: 3.10 cm  MITRAL VALVE                TRICUSPID VALVE MV Area (PHT): 1.84 cm     TR Peak grad:   27.7 mmHg MV Decel Time: 412 msec     TR Vmax:        263.00 cm/s MV E velocity: 101.00 cm/s MV A velocity: 130.00 cm/s  SHUNTS MV E/A ratio:  0.78         Systemic VTI: 0.29 m  Armanda Magic MD Electronically signed by Armanda Magic MD Signature Date/Time: 06/17/2021/10:23:16 AM    Final  EKG:  EKG is  ordered today.  The ekg ordered today demonstrates sinus rhythm 72 with nonspecific ST-T wave changes, LVH type pattern V4 through V6 prior EKG showed sinus rhythm 66 with LVH  Recent Labs: 06/03/2022: Magnesium 1.8 06/05/2022: TSH 0.283 08/01/2022: ALT 13; BUN 16; Creatinine 0.72; Hemoglobin 8.4; Platelet Count 203; Potassium 3.9; Sodium 136  Recent Lipid Panel No results found for: "CHOL", "TRIG", "HDL", "CHOLHDL", "VLDL", "LDLCALC", "LDLDIRECT"   Risk Assessment/Calculations:              Physical Exam:    VS:  BP (!) 114/52   Pulse 61   Ht 5\' 11"  (1.803 m)   Wt 128 lb (58.1 kg)   SpO2 97%   BMI 17.85 kg/m     Wt Readings from Last 3 Encounters:  08/25/22 128 lb (58.1 kg)  08/01/22 128 lb 4 oz (58.2 kg)  07/25/22 128 lb 8 oz (58.3 kg)     GEN:  Thin in no acute distress HEENT: Normal NECK: No JVD; No carotid bruits LYMPHATICS: No lymphadenopathy CARDIAC: RRR, 3/6 holosystolic apical murmur, no rubs, gallops RESPIRATORY:  Clear to auscultation without rales, wheezing or rhonchi  ABDOMEN: Soft, non-tender, non-distended MUSCULOSKELETAL:  No edema; No deformity  SKIN:  Warm and dry NEUROLOGIC:  Alert and oriented x 3 PSYCHIATRIC:  Normal affect   No change in exam. +cough.   ASSESSMENT:    1. S/P AVR (aortic valve replacement)   2. Hepatocellular carcinoma   3. Squamous cell carcinoma of lung, right     PLAN:    In order of problems listed above:   S/P AVR (aortic valve replacement) Echocardiogram as above showed normal functioning 21 mm pericardial Magna Ease valve placed on 2015.  Gradients are stable. Dr. Laneta Simmers.  Continue with dental prophylaxis.  Murmur heard on exam.  Pure hypercholesterolemia LDL 85 at last check.  Continue with atorvastatin 20 mg a day.  Prior hepatocellular carcinoma.  No myalgias.  Hepatocellular carcinoma [facility]) Had prior ablative therapy at [facility] in 2019.  Subsequent MRI in 2021 showed no new lesions.  Cavity size was decreasing.  Squamous cell lung carcinoma Followed by Dr. Shirline Frees.  Chemoradiation.  Cough fairly persistent at this point.  Lack of energy.  He has an appointment coming up soon.  Diabetes mellitus with coincident hypertension (HCC) Prior hemoglobin A1c 6.8.  Statin therapy.  He is on low-dose aspirin.  He is on ACE inhibitor for renal protection.  Blood pressure control.  Also on HCTZ.  Doing well.  Coronary artery disease Previously nonobstructive CAD prior to aortic valve replacement.  Continue with aspirin, statin.  No signs of angina.     Medication Adjustments/Labs and Tests Ordered: Current medicines are reviewed at length with the patient today.  Concerns regarding medicines are outlined above.  No orders of the defined types were placed in this encounter.  No orders of the defined types were placed in this encounter.   Patient Instructions  Medication Instructions:  The current medical regimen is effective;  continue present plan and medications.  *If you need a refill on your cardiac medications before your next appointment, please call your  pharmacy*  Follow-Up: At [facility], you and your health needs are our priority.  As part of our continuing mission to provide you with exceptional heart care, we have created designated Provider Care Teams.  These Care Teams include your primary Cardiologist (physician) and Advanced Practice Providers (APPs -  Physician Assistants  and Nurse Practitioners) who all work together to provide you with the care you need, when you need it.  We recommend signing up for the patient portal called "MyChart".  Sign up information is provided on this After Visit Summary.  MyChart is used to connect with patients for Virtual Visits (Telemedicine).  Patients are able to view lab/test results, encounter notes, upcoming appointments, etc.  Non-urgent messages can be sent to your provider as well.   To learn more about what you can do with MyChart, go to [email or web address].    Your next appointment:   1 year(s)  Provider:   Donato Schultz, MD        Signed, Donato Schultz, MD  08/25/2022 9:24 AM    [facility]

## 2022-08-26 ENCOUNTER — Other Ambulatory Visit (HOSPITAL_COMMUNITY): Payer: Self-pay | Admitting: *Deleted

## 2022-08-26 DIAGNOSIS — R1310 Dysphagia, unspecified: Secondary | ICD-10-CM

## 2022-08-26 DIAGNOSIS — R059 Cough, unspecified: Secondary | ICD-10-CM

## 2022-08-29 ENCOUNTER — Ambulatory Visit (HOSPITAL_COMMUNITY)
Admission: RE | Admit: 2022-08-29 | Discharge: 2022-08-29 | Disposition: A | Discharge status: Home / Self Care | Payer: Medicare Other | Source: Ambulatory Visit | Attending: Internal Medicine | Admitting: Internal Medicine

## 2022-08-29 ENCOUNTER — Inpatient Hospital Stay: Payer: Medicare Other

## 2022-08-29 ENCOUNTER — Inpatient Hospital Stay: Payer: Medicare Other | Attending: Internal Medicine

## 2022-08-29 ENCOUNTER — Other Ambulatory Visit: Payer: Self-pay

## 2022-08-29 DIAGNOSIS — C3411 Malignant neoplasm of upper lobe, right bronchus or lung: Secondary | ICD-10-CM | POA: Insufficient documentation

## 2022-08-29 DIAGNOSIS — C3490 Malignant neoplasm of unspecified part of unspecified bronchus or lung: Secondary | ICD-10-CM | POA: Diagnosis not present

## 2022-08-29 DIAGNOSIS — R1310 Dysphagia, unspecified: Secondary | ICD-10-CM | POA: Insufficient documentation

## 2022-08-29 DIAGNOSIS — D509 Iron deficiency anemia, unspecified: Secondary | ICD-10-CM

## 2022-08-29 DIAGNOSIS — Z95828 Presence of other vascular implants and grafts: Secondary | ICD-10-CM

## 2022-08-29 DIAGNOSIS — C3491 Malignant neoplasm of unspecified part of right bronchus or lung: Secondary | ICD-10-CM

## 2022-08-29 DIAGNOSIS — J439 Emphysema, unspecified: Secondary | ICD-10-CM | POA: Diagnosis not present

## 2022-08-29 LAB — CBC WITH DIFFERENTIAL (CANCER CENTER ONLY)
Abs Immature Granulocytes: 0.05 10*3/uL (ref 0.00–0.07)
Basophils Absolute: 0.1 10*3/uL (ref 0.0–0.1)
Basophils Relative: 1 %
Eosinophils Absolute: 0.4 10*3/uL (ref 0.0–0.5)
Eosinophils Relative: 5 %
HCT: 24.1 % — ABNORMAL LOW (ref 39.0–52.0)
Hemoglobin: 8 g/dL — ABNORMAL LOW (ref 13.0–17.0)
Immature Granulocytes: 1 %
Lymphocytes Relative: 8 %
Lymphs Abs: 0.7 10*3/uL (ref 0.7–4.0)
MCH: 27.8 pg (ref 26.0–34.0)
MCHC: 33.2 g/dL (ref 30.0–36.0)
MCV: 83.7 fL (ref 80.0–100.0)
Monocytes Absolute: 1.2 10*3/uL — ABNORMAL HIGH (ref 0.1–1.0)
Monocytes Relative: 15 %
Neutro Abs: 5.5 10*3/uL (ref 1.7–7.7)
Neutrophils Relative %: 70 %
Platelet Count: 447 10*3/uL — ABNORMAL HIGH (ref 150–400)
RBC: 2.88 MIL/uL — ABNORMAL LOW (ref 4.22–5.81)
RDW: 25.2 % — ABNORMAL HIGH (ref 11.5–15.5)
WBC Count: 7.9 10*3/uL (ref 4.0–10.5)
nRBC: 0 % (ref 0.0–0.2)

## 2022-08-29 LAB — CMP (CANCER CENTER ONLY)
ALT: 13 U/L (ref 0–44)
AST: 18 U/L (ref 15–41)
Albumin: 2.9 g/dL — ABNORMAL LOW (ref 3.5–5.0)
Alkaline Phosphatase: 55 U/L (ref 38–126)
Anion gap: 7 (ref 5–15)
BUN: 12 mg/dL (ref 8–23)
CO2: 26 mmol/L (ref 22–32)
Calcium: 9.2 mg/dL (ref 8.9–10.3)
Chloride: 103 mmol/L (ref 98–111)
Creatinine: 0.73 mg/dL (ref 0.61–1.24)
GFR, Estimated: >60 mL/min (ref >60)
Glucose, Bld: 217 mg/dL — ABNORMAL HIGH (ref 70–99)
Potassium: 3.7 mmol/L (ref 3.5–5.1)
Sodium: 136 mmol/L (ref 135–145)
Total Bilirubin: 0.2 mg/dL — ABNORMAL LOW (ref 0.3–1.2)
Total Protein: 7.9 g/dL (ref 6.5–8.1)

## 2022-08-29 MED ORDER — SODIUM CHLORIDE 0.9% FLUSH
10.0000 mL | Freq: Once | INTRAVENOUS | Status: AC
  Administered 2022-08-29: 10 mL

## 2022-08-29 MED ORDER — IOHEXOL 300 MG/ML  SOLN
75.0000 mL | Freq: Once | INTRAMUSCULAR | Status: AC | PRN
  Administered 2022-08-29: 75 mL via INTRAVENOUS

## 2022-08-29 MED ORDER — SODIUM CHLORIDE (PF) 0.9 % IJ SOLN
INTRAMUSCULAR | Status: AC
  Filled 2022-08-29: qty 50

## 2022-08-31 ENCOUNTER — Ambulatory Visit (HOSPITAL_COMMUNITY)
Admission: RE | Admit: 2022-08-31 | Discharge: 2022-08-31 | Disposition: A | Discharge status: Home / Self Care | Payer: Medicare Other | Source: Ambulatory Visit | Attending: Family Medicine | Admitting: Family Medicine

## 2022-08-31 DIAGNOSIS — R1310 Dysphagia, unspecified: Secondary | ICD-10-CM

## 2022-08-31 DIAGNOSIS — Z85118 Personal history of other malignant neoplasm of bronchus and lung: Secondary | ICD-10-CM | POA: Diagnosis not present

## 2022-08-31 DIAGNOSIS — R1314 Dysphagia, pharyngoesophageal phase: Secondary | ICD-10-CM | POA: Insufficient documentation

## 2022-08-31 DIAGNOSIS — R059 Cough, unspecified: Secondary | ICD-10-CM | POA: Diagnosis present

## 2022-08-31 DIAGNOSIS — Z9221 Personal history of antineoplastic chemotherapy: Secondary | ICD-10-CM | POA: Insufficient documentation

## 2022-08-31 DIAGNOSIS — Z923 Personal history of irradiation: Secondary | ICD-10-CM | POA: Diagnosis not present

## 2022-08-31 NOTE — Therapy (Signed)
Modified Barium Swallow Study  Patient Details  Name: [name] MRN: [id number] Date of Birth: April 29, 1941  Today's Date: 08/31/2022  Modified Barium Swallow completed.  Full report located under Chart Review in the Imaging Section.  History of Present Illness [name] is a 82 y.o. male with PMH: stage IIb non small cell lung cancer, squamous cell carcinoma s/p chemoradiation treatment. (completed last treatment two weeks ago), heart murmur, aortic stenosis, PONV, essential hypertension. He presented to this OP MBS secondary to dysphagia with difficulty swallowing pills, globus sensation and coughing. During interview prior to performing MBS, patient told SLP his swallowing issues started during chemoradiation treatment and although it does seem that they have improved, he is still having symptoms. When he swallows pills, he feels like they are getting lodged in throat and this results in discomfort and coughing with the liquids he is drinking with pills. He reports that he cannot sleep flat because he will cough and that he does not eat or drink much before bed because this will cause increased coughing at night. He does endorse becoming SOB when he is having a coughing episode. He does indicate decreased appetite but no significant weight loss.   Clinical Impression Patient presents with an oropharyngeal swallow that appears [facility] but with a suspected primary esophageal dysphagia. No penetration or aspiration observed with any of the tested barium consistencies and although patient did have a trace amount of residuals in vallecular sinus after initial swallows with thicker liquids, he had good sensation of this and swallowed a second time which cleared all residuals. Esophageal dysphagia was not observed until patient swallowed 13mm barium tablet. It passed through PES without difficulty but then became lodged in upper esophagus. Attempts were made to transit pill, such as thin liquids, puree  solids, nectar thick liquids but to no avail. Patient did have good sensation of pill being lodged and he would continue to try to swallow and at one point he massaged his neck, saying he does this at times to try to push pills down. During attempts to dislodge pill, some retrograde movement of barium observed in the upper esophagus but this remained below PES. SLP recommending patient f/u with MD and consider further esophageal assessment. Factors that may increase risk of adverse event in presence of aspiration [name] & [name] 2021): Frail or deconditioned  Swallow Evaluation Recommendations Recommendations: PO diet PO Diet Recommendation: Regular;Thin liquids (Level 0) Liquid Administration via: Cup;Straw Medication Administration: Other (Comment) Supervision: Other (comment) (as tolerated, crushed or cut in half might be better tolerated) Swallowing strategies  : Small bites/sips Postural changes: Stay upright 30-60 min after meals;Position pt fully upright for meals Recommended consults: Consider esophageal assessment      Angela Nevin, MA, CCC-SLP Speech Therapy

## 2022-09-01 ENCOUNTER — Inpatient Hospital Stay (HOSPITAL_BASED_OUTPATIENT_CLINIC_OR_DEPARTMENT_OTHER): Payer: Medicare Other | Admitting: Internal Medicine

## 2022-09-01 ENCOUNTER — Other Ambulatory Visit: Payer: Self-pay

## 2022-09-01 VITALS — BP 134/73 | HR 86 | Temp 97.9°F | Resp 18 | Ht 71.0 in | Wt 133.9 lb

## 2022-09-01 DIAGNOSIS — C3490 Malignant neoplasm of unspecified part of unspecified bronchus or lung: Secondary | ICD-10-CM | POA: Diagnosis not present

## 2022-09-01 DIAGNOSIS — C3411 Malignant neoplasm of upper lobe, right bronchus or lung: Secondary | ICD-10-CM | POA: Diagnosis not present

## 2022-09-01 DIAGNOSIS — R1310 Dysphagia, unspecified: Secondary | ICD-10-CM | POA: Diagnosis not present

## 2022-09-01 MED ORDER — PREDNISONE 10 MG PO TABS: ORAL_TABLET | ORAL | 0 refills | Status: DC

## 2022-09-01 NOTE — Progress Notes (Signed)
[facility] Telephone:([phone]) [phone]   [phone]  OFFICE PROGRESS NOTE  [name], [name], MD [address] [place] [address]  DIAGNOSIS: Stage IIIB (T3, N2, M0) non-small cell lung cancer, squamous cell carcinoma.  The patient presented with a large centrally necrotic right upper lobe lung mass abutting the mediastinum, T3 and T4 vertebral bodies and a hypermetabolic precarinal lymph node diagnosed in January 2024.     PDL1: 40%    PRIOR THERAPY: Concurrent chemoradiation with carboplatin for an AUC of 2 and paclitaxel 45 mg/m starting 06/20/2022.  Status post 7 cycles.  Last dose was given July 28, 2022 with partial response     CURRENT THERAPY: None.  Awaiting resolution of the pneumonitis before considering immunotherapy  INTERVAL HISTORY: TYREK LAWHORN 82 y.o. male returns to the clinic today for follow-up visit accompanied by his wife.  The patient continues to complain of persistent dry cough in addition to odynophagia and dysphagia.  He is currently on Carafate with minimal improvement.  He has a swallow study by his primary care physician that was concerning for aspiration and dysphagia.  He was referred to gastroenterology for evaluation of his condition but has not seen them yet.  He denied having any chest pain but has shortness of breath with exertion and no hemoptysis.  He lost several pounds since his last visit.  He has lack of appetite.  He denied having any fever or chills.  He has no nausea, vomiting, diarrhea or constipation.  He is here today for evaluation with repeat CT scan of the chest for restaging of his disease.  MEDICAL HISTORY: Past Medical History:  Diagnosis Date   Acute epididymo-orchitis    Aortic stenosis    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   Arthritis    Diabetes mellitus without complication    ED (erectile dysfunction)    Essential hypertension, benign    Heart murmur    Hep C w/o coma,  chronic    History of blood transfusion    History of DVT of lower extremity    Hx of echocardiogram    Echo (8/15):  Severe LVH, EF 60-65%, no RWMA, Gr 3 DD, AVR ok (mean 21 mmHg), MAC, mild LAE, mild to mod RAE   Hypercholesterolemia    PONV (postoperative nausea and vomiting)     ALLERGIES:  is allergic to azithromycin, lisinopril, ezetimibe-simvastatin, metoprolol, and rosuvastatin.  MEDICATIONS:  Current Outpatient Medications  Medication Sig Dispense Refill   acetaminophen (TYLENOL) 500 MG tablet Take 500 mg by mouth every 6 (six) hours as needed for mild pain or moderate pain.     amLODipine (NORVASC) 10 MG tablet Take 1 tablet (10 mg total) by mouth daily. 30 tablet 0   amoxicillin (AMOXIL) 500 MG capsule Take 2,000 mg by mouth.  4 pills before dental procedure for 1 days     aspirin EC 81 MG EC tablet Take 1 tablet (81 mg total) by mouth daily.     atorvastatin (LIPITOR) 20 MG tablet Take 20 mg by mouth daily.     benzonatate (TESSALON) 100 MG capsule Take 1 capsule (100 mg total) by mouth 3 (three) times daily as needed for cough. 30 capsule 2   dextromethorphan-guaiFENesin (MUCINEX DM) 30-600 MG 12hr tablet Take 1 tablet by mouth 2 (two) times daily as needed for cough.     fluticasone-salmeterol (ADVAIR HFA) 115-21 MCG/ACT inhaler Inhale 2 puffs into the lungs  2 (two) times daily. 1 each 11   glucose blood test strip      glucose blood test strip as directed DX 250     hydrochlorothiazide (HYDRODIURIL) 12.5 MG tablet Take 1 tablet (12.5 mg total) by mouth daily. 30 tablet 0   lidocaine-prilocaine (EMLA) cream Apply 1 Application topically as needed. 30 g 2   metFORMIN (GLUCOPHAGE) 1000 MG tablet Take 1,000 mg by mouth 2 (two) times daily with a meal.     Multiple Vitamin (MULTIVITAMIN WITH MINERALS) TABS tablet Take 1 tablet by mouth daily.     prochlorperazine (COMPAZINE) 10 MG tablet Take 1 tablet (10 mg total) by mouth every 6 (six) hours as needed. 30 tablet 2   SF  5000 PLUS 1.1 % CREA dental cream Place 1 Application onto teeth daily.  2   sucralfate (CARAFATE) 1 g tablet Take 1 tablet (1 g total) by mouth 4 (four) times daily -  with meals and at bedtime. Crush and dissolve in 10 mL of warm water, swallow 20 min before meals 120 tablet 1   tamsulosin (FLOMAX) 0.4 MG CAPS capsule Take 0.4 mg by mouth daily.     No current facility-administered medications for this visit.    SURGICAL HISTORY:  Past Surgical History:  Procedure Laterality Date   AORTIC VALVE REPLACEMENT N/A 11/19/2013   Procedure: AORTIC VALVE REPLACEMENT (AVR);  Surgeon: Alleen Borne, MD;  Location: [facility] OR;  Service: Open Heart Surgery;  Laterality: N/A;   BUNIONECTOMY Bilateral 05/09/2006   CARDIAC CATHETERIZATION  10/01/2013   COLONOSCOPY  01/07/2010   internal hemorrhoids.  Dr Arlyce Dice   ESOPHAGOGASTRODUODENOSCOPY N/A 04/24/2013   Procedure: ESOPHAGOGASTRODUODENOSCOPY (EGD);  Surgeon: Beverley Fiedler, MD;  Location: [facility] ENDOSCOPY;  Service: Endoscopy;  Laterality: N/A;   INGUINAL HERNIA REPAIR Right    INTRAOPERATIVE TRANSESOPHAGEAL ECHOCARDIOGRAM N/A 11/19/2013   Procedure: INTRAOPERATIVE TRANSESOPHAGEAL ECHOCARDIOGRAM;  Surgeon: Alleen Borne, MD;  Location: [facility] OR;  Service: Open Heart Surgery;  Laterality: N/A;   IR IMAGING GUIDED PORT INSERTION  06/30/2022   KNEE ARTHROSCOPY Left 05/09/2009   Dr Leslee Home   LAMINECTOMY  06/09/2005   Dr Jeral Fruit. multiple lumbar level laminectomies.    LEFT AND RIGHT HEART CATHETERIZATION WITH CORONARY ANGIOGRAM N/A 10/01/2013   Procedure: LEFT AND RIGHT HEART CATHETERIZATION WITH CORONARY ANGIOGRAM;  Surgeon: Donato Schultz, MD;  Location: [facility] CATH LAB;  Service: Cardiovascular;  Laterality: N/A;   SPINAL FUSION     lumbar   TEE WITHOUT CARDIOVERSION  11/07/2003   Aoritic valve sclerosis.    TONSILLECTOMY     VIDEO BRONCHOSCOPY WITH ENDOBRONCHIAL ULTRASOUND N/A 06/02/2022   Procedure: VIDEO BRONCHOSCOPY WITH ENDOBRONCHIAL ULTRASOUND;  Surgeon:  Luciano Cutter, MD;  Location: [facility] OR;  Service: Thoracic;  Laterality: N/A;    REVIEW OF SYSTEMS:  Constitutional: positive for anorexia, fatigue, and weight loss Eyes: negative Ears, nose, mouth, throat, and face: negative Respiratory: positive for cough and dyspnea on exertion Cardiovascular: negative Gastrointestinal: positive for dysphagia and odynophagia Genitourinary:negative Integument/breast: negative Hematologic/lymphatic: negative Musculoskeletal:negative Neurological: negative Behavioral/Psych: negative Endocrine: negative Allergic/Immunologic: negative   PHYSICAL EXAMINATION: General appearance: alert, cooperative, fatigued, and no distress Head: Normocephalic, without obvious abnormality, atraumatic Neck: no adenopathy, no JVD, supple, symmetrical, trachea midline, and thyroid not enlarged, symmetric, no tenderness/mass/nodules Lymph nodes: Cervical, supraclavicular, and axillary nodes normal. Resp: rales RLL and RUL Back: symmetric, no curvature. ROM normal. No CVA tenderness. Cardio: regular rate and rhythm, S1, S2 normal, no murmur, click, rub or gallop GI:  soft, non-tender; bowel sounds normal; no masses,  no organomegaly Extremities: extremities normal, atraumatic, no cyanosis or edema Neurologic: Alert and oriented X 3, normal strength and tone. Normal symmetric reflexes. Normal coordination and gait  ECOG PERFORMANCE STATUS: 1 - Symptomatic but completely ambulatory  Blood pressure 134/73, pulse 86, temperature 97.9 F (36.6 C), temperature source Temporal, resp. rate 18, height  (1.803 m), weight 133 lb 14.4 oz (60.7 kg), SpO2 97 %.  LABORATORY DATA: Lab Results  Component Value Date   WBC 7.9 08/29/2022   HGB 8.0 (L) 08/29/2022   HCT 24.1 (L) 08/29/2022   MCV 83.7 08/29/2022   PLT 447 (H) 08/29/2022      Chemistry      Component Value Date/Time   NA 136 08/29/2022 1050   K 3.7 08/29/2022 1050   CL 103 08/29/2022 1050   CO2 26 08/29/2022  1050   BUN 12 08/29/2022 1050   CREATININE 0.73 08/29/2022 1050      Component Value Date/Time   CALCIUM 9.2 08/29/2022 1050   ALKPHOS 55 08/29/2022 1050   AST 18 08/29/2022 1050   ALT 13 08/29/2022 1050   BILITOT 0.2 (L) 08/29/2022 1050       RADIOGRAPHIC STUDIES: DG SWALLOW FUNC OP MEDICARE SPEECH PATH  Result Date: 08/31/2022 Table formatting from the original result was not included. Modified Barium Swallow Study Patient Details Name: [name] MRN: [id number] Date of Birth: May 22, 1940 Today's Date: 08/31/2022 HPI/PMH: HPI: Nathanial Arrighi is a 82 y.o. male with PMH: stage IIb non small cell lung cancer, squamous cell carcinoma s/p chemoradiation treatment. (completed last treatment two weeks ago), heart murmur, aortic stenosis, PONV, essential hypertension. He presented to this OP MBS secondary to dysphagia with difficulty swallowing pills, globus sensation and coughing. During interview prior to performing MBS, patient told SLP his swallowing issues started during chemoradiation treatment and although it does seem that they have improved, he is still having symptoms. When he swallows pills, he feels like they are getting lodged in throat and this results in discomfort and coughing with the liquids he is drinking with pills. He reports that he cannot sleep flat because he will cough and that he does not eat or drink much before bed because this will cause increased coughing at night. He does endorse becoming SOB when he is having a coughing episode. He does indicate decreased appetite but no significant weight loss. Clinical Impression: Clinical Impression: Patient presents with an oropharyngeal swallow that appears [facility] but with a suspected primary esophageal dysphagia. No penetration or aspiration observed with any of the tested barium consistencies and although patient did have a trace amount of residuals in vallecular sinus after initial swallows with thicker liquids, he had good sensation of  this and swallowed a second time which cleared all residuals. Esophageal dysphagia was not observed until patient swallowed 13mm barium tablet. It passed through PES without difficulty but then became lodged in upper esophagus. Attempts were made to transit pill, such as thin liquids, puree solids, nectar thick liquids but to no avail. Patient did have good sensation of pill being lodged and he would continue to try to swallow and at one point he massaged his neck, saying he does this at times to try to push pills down. During attempts to dislodge pill, some retrograde movement of barium observed in the upper esophagus but this remained below PES. SLP recommending patient f/u with MD and consider further esophageal assessment. Factors that may increase risk of adverse  event in presence of aspiration Rubye Oaks [employer] Clearance Coots 2021): Factors that may increase risk of adverse event in presence of aspiration Rubye Oaks [employer] Clearance Coots 2021): Frail or deconditioned Recommendations/Plan: Swallowing Evaluation Recommendations Swallowing Evaluation Recommendations Recommendations: PO diet PO Diet Recommendation: Regular; Thin liquids (Level 0) Liquid Administration via: Cup; Straw Medication Administration: Other (Comment) Supervision: Other (comment) (as tolerated, crushed or cut in half might be better tolerated) Swallowing strategies  : Small bites/sips Postural changes: Stay upright 30-60 min after meals; Position pt fully upright for meals Recommended consults: Consider esophageal assessment Treatment Plan Treatment Plan Follow-up recommendations: No SLP follow up Functional status assessment: Patient has had a recent decline in their functional status and demonstrates the ability to make significant improvements in function in a reasonable and predictable amount of time. Recommendations Recommendations for follow up therapy are one component of a multi-disciplinary discharge planning process, led by the attending physician.   Recommendations may be updated based on patient status, additional functional criteria and insurance authorization. Assessment: Orofacial Exam: Orofacial Exam Oral Cavity: Oral Hygiene: WFL Oral Cavity - Dentition: Adequate natural dentition Orofacial Anatomy: WFL Oral Motor/Sensory Function: WFL Anatomy: Anatomy: WFL Boluses Administered: Boluses Administered Boluses Administered: Thin liquids (Level 0); Mildly thick liquids (Level 2, nectar thick); Moderately thick liquids (Level 3, honey thick); Puree; Solid  Oral Impairment Domain: Oral Impairment Domain Lip Closure: No labial escape Tongue control during bolus hold: Cohesive bolus between tongue to palatal seal Bolus preparation/mastication: Timely and efficient chewing and mashing Bolus transport/lingual motion: Brisk tongue motion Oral residue: Complete oral clearance Location of oral residue : N/A Initiation of pharyngeal swallow : Valleculae  Pharyngeal Impairment Domain: Pharyngeal Impairment Domain Soft palate elevation: No bolus between soft palate (SP)/pharyngeal wall (PW) Laryngeal elevation: Complete superior movement of thyroid cartilage with complete approximation of arytenoids to epiglottic petiole Anterior hyoid excursion: Complete anterior movement Epiglottic movement: Complete inversion Laryngeal vestibule closure: Complete, no air/contrast in laryngeal vestibule Pharyngeal stripping wave : Present - complete Pharyngoesophageal segment opening: Partial distention/partial duration, partial obstruction of flow Tongue base retraction: No contrast between tongue base and posterior pharyngeal wall (PPW) Pharyngeal residue: Complete pharyngeal clearance Location of pharyngeal residue: N/A  Esophageal Impairment Domain: Esophageal Impairment Domain Esophageal clearance upright position: Esophageal retention with retrograde flow below pharyngoesophageal segment (PES) Pill: Esophageal Impairment Domain Esophageal clearance upright position: Esophageal  retention with retrograde flow below pharyngoesophageal segment (PES) Penetration/Aspiration Scale Score: Penetration/Aspiration Scale Score 1.  Material does not enter airway: Thin liquids (Level 0); Mildly thick liquids (Level 2, nectar thick); Moderately thick liquids (Level 3, honey thick); Puree; Solid; Pill Compensatory Strategies: Compensatory Strategies Compensatory strategies: Yes Straw: Effective Effective Straw: Thin liquid (Level 0)   General Information: Caregiver present: No  Diet Prior to this Study: Regular; Thin liquids (Level 0)   Temperature : Normal   Respiratory Status: WFL   Supplemental O2: None (Room air)   History of Recent Intubation: No  Behavior/Cognition: Alert; Cooperative; Pleasant mood Self-Feeding Abilities: Able to self-feed Baseline vocal quality/speech: Normal Volitional Cough: Able to elicit Volitional Swallow: Able to elicit Exam Limitations: No limitations Goal Planning: No data recorded No data recorded No data recorded No data recorded Consulted and agree with results and recommendations: Patient Pain: Pain Assessment Facial Expression: 0 Body Movements: 0 Muscle Tension: 0 Compliance with ventilator (intubated pts.): 0 Vocalization (extubated pts.): N/A CPOT Total: 0 End of Session: Start Time:SLP Start Time (ACUTE ONLY): 1140 Stop Time: SLP Stop Time (ACUTE ONLY): 1200 Time Calculation:SLP Time Calculation (min) (  ACUTE ONLY): 20 min Charges: SLP Evaluations $ SLP Speech Visit: 1 Visit SLP Evaluations $Outpatient MBS Swallow: 1 Procedure SLP visit diagnosis: SLP Visit Diagnosis: Dysphagia, pharyngoesophageal phase (R13.14) Past Medical History: Past Medical History: Diagnosis Date  Acute epididymo-orchitis   Aortic stenosis   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  Arthritis   Diabetes mellitus without complication   ED (erectile dysfunction)   Essential hypertension, benign   Heart murmur   Hep C w/o coma, chronic   History of blood transfusion   History of DVT of lower  extremity   Hx of echocardiogram   Echo (8/15):  Severe LVH, EF 60-65%, no RWMA, Gr 3 DD, AVR ok (mean 21 mmHg), MAC, mild LAE, mild to mod RAE  Hypercholesterolemia   PONV (postoperative nausea and vomiting)  Past Surgical History: Past Surgical History: Procedure Laterality Date  AORTIC VALVE REPLACEMENT N/A 11/19/2013  Procedure: AORTIC VALVE REPLACEMENT (AVR);  Surgeon: Alleen Borne, MD;  Location: [facility] OR;  Service: Open Heart Surgery;  Laterality: N/A;  BUNIONECTOMY Bilateral 05/09/2006  CARDIAC CATHETERIZATION  10/01/2013  COLONOSCOPY  01/07/2010  internal hemorrhoids.  Dr Arlyce Dice  ESOPHAGOGASTRODUODENOSCOPY N/A 04/24/2013  Procedure: ESOPHAGOGASTRODUODENOSCOPY (EGD);  Surgeon: Beverley Fiedler, MD;  Location: [facility] ENDOSCOPY;  Service: Endoscopy;  Laterality: N/A;  INGUINAL HERNIA REPAIR Right   INTRAOPERATIVE TRANSESOPHAGEAL ECHOCARDIOGRAM N/A 11/19/2013  Procedure: INTRAOPERATIVE TRANSESOPHAGEAL ECHOCARDIOGRAM;  Surgeon: Alleen Borne, MD;  Location: [facility] OR;  Service: Open Heart Surgery;  Laterality: N/A;  IR IMAGING GUIDED PORT INSERTION  06/30/2022  KNEE ARTHROSCOPY Left 05/09/2009  Dr Leslee Home  LAMINECTOMY  06/09/2005  Dr Jeral Fruit. multiple lumbar level laminectomies.   LEFT AND RIGHT HEART CATHETERIZATION WITH CORONARY ANGIOGRAM N/A 10/01/2013  Procedure: LEFT AND RIGHT HEART CATHETERIZATION WITH CORONARY ANGIOGRAM;  Surgeon: Donato Schultz, MD;  Location: [facility] CATH LAB;  Service: Cardiovascular;  Laterality: N/A;  SPINAL FUSION    lumbar  TEE WITHOUT CARDIOVERSION  11/07/2003  Aoritic valve sclerosis.   TONSILLECTOMY    VIDEO BRONCHOSCOPY WITH ENDOBRONCHIAL ULTRASOUND N/A 06/02/2022  Procedure: VIDEO BRONCHOSCOPY WITH ENDOBRONCHIAL ULTRASOUND;  Surgeon: Luciano Cutter, MD;  Location: [facility] OR;  Service: Thoracic;  Laterality: N/A; Angela Nevin, MA, CCC-SLP Speech Therapy CLINICAL DATA:  Provided history: Dysphagia, unspecified type. Cough, unspecified type. EXAM: MODIFIED BARIUM SWALLOW TECHNIQUE: Different  consistencies of barium were administered orally to the patient by the speech pathologist. Imaging of the pharynx was performed in the lateral projection. Rockie Neighbours, PA-C (supervised by Dr. Jackey Loge) was present in the fluoroscopy room and operated the fluoroscopy equipment. FLUOROSCOPY: Fluoroscopy time: 3 minutes 54 seconds (33.97 mGy). COMPARISON:  Chest CT 08/29/2022. FINDINGS: Different consistencies of barium were administered orally to the patient by the speech pathologist with fluoroscopic imaging of the pharynx from a lateral projection, as well as limited imaging of the esophagus in the lateral and PA projections. Rockie Neighbours, PA-C was present in the fluoroscopy room and operated the fluoroscopy equipment. The speech pathologist did not observe aspiration. Please refer to the speech pathologist's report for full details. A swallowed 13 mm barium tablet did not pass beyond the level of the upper esophagus (at ther thoracic inlet) despite a prolonged period of observation, and despite the patient taking additional liquid contrast. Impression #4 will be called to the ordering clinician or representative by the Radiologist Assistant, and communication documented in the PACS or [employer]. IMPRESSION: 1. Modified barium swallow as described 2. No aspiration was observed. 3. Please refer to the speech  pathologist's report for complete details and recommendations. 4. A swallowed 13 mm barium tablet did not pass beyond the upper esophagus (at the level of the thoracic inlet) despite a prolonged period of observation, and despite the patient taking additional liquid contrast. Findings are concerning for a stricture or other obstructing lesion at this site, and an esophagram is recommended for further evaluation. Electronically Signed   By: Jackey Loge D.O.   On: 08/31/2022 13:24  CT Chest W Contrast  Result Date: 08/31/2022 CLINICAL DATA:  Non-small-cell lung cancer.  Restaging. EXAM: CT CHEST  WITH CONTRAST TECHNIQUE: Multidetector CT imaging of the chest was performed during intravenous contrast administration. RADIATION DOSE REDUCTION: This exam was performed according to the departmental dose-optimization program which includes automated exposure control, adjustment of the mA and/or kV according to patient size and/or use of iterative reconstruction technique. CONTRAST:  75mL OMNIPAQUE IOHEXOL 300 MG/ML  SOLN COMPARISON:  PET-CT 05/27/2022. FINDINGS: Cardiovascular: The heart size is normal. No substantial pericardial effusion. Coronary artery calcification is evident. Moderate atherosclerotic calcification is noted in the wall of the thoracic aorta. Status post aortic valve replacement. Right Port-A-Cath tip is positioned in the distal SVC. Mediastinum/Nodes: 11 mm short axis precarinal node measured previously is 11 mm short axis again today on 58/2. 13 mm short axis subcarinal node on 65/2 is new in the interval. No left hilar lymphadenopathy. No discrete lymphadenopathy in the right hilum. Circumferential wall thickening noted in the mid esophagus, new since prior. There is no axillary lymphadenopathy. Lungs/Pleura: Centrilobular and paraseptal emphysema evident. Suprahilar medial right upper lobe pulmonary lesion measured previously at 5.8 x 5.0 cm is now 4.6 x 3.9 cm. Lesion was confluent soft tissue density on prior imaging and is now largely cavitary in appearance with thick irregular circumferential wall. Marked progression of interstitial and septal thickening in the posterior right upper lobe along the fissure. There is new dense consolidative airspace disease in the right lower lobe with a masslike configuration measuring 4.8 x 3.9 cm. Substantial surrounding interstitial and airspace disease is noted in the right lower lobe is well. Interstitial thickening extends inferiorly to the right lung base. There is a new nodular collection of consolidative opacity in the suprahilar left upper lobe  measuring 2.1 x 1.6 cm on image 40/5. No evidence for pleural effusion. Upper Abdomen: Bilateral renal cysts are incompletely visualized but similar to prior. Musculoskeletal: No worrisome lytic or sclerotic osseous abnormality. Degenerative changes noted in both shoulders. IMPRESSION: 1. Interval decrease in size of the suprahilar medial right upper lobe pulmonary lesion. The mass is now largely cavitary in appearance with thick irregular circumferential wall. 2. Marked progression of interstitial and septal thickening in the posterior right upper lobe along the fissure. This is associated with new dense consolidative airspace disease in the right lower lobe with a masslike configuration measuring 4.8 x 3.9 cm. Substantial surrounding interstitial and airspace disease in the right lower lobe as well. Findings may reflect an atypical marked response to radiation therapy, but infectious/inflammatory etiology or even tumor progression cannot be excluded. 3. New nodular collection of consolidative opacity in the suprahilar left upper lobe measuring 2.1 x 1.6 cm. This may also be infectious/inflammatory although metastatic disease not excluded. 4. New subcarinal lymphadenopathy.  Metastatic disease a concern. 5. Circumferential wall thickening in the mid esophagus, new since prior. Potentially radiation related. Esophagitis could have this appearance. Neoplasm not excluded. 6. Aortic Atherosclerosis (ICD10-I70.0) and Emphysema (ICD10-J43.9). Electronically Signed   By: Jamison Oka.D.  On: 08/31/2022 09:50    ASSESSMENT AND PLAN: This is a very pleasant 82 years old African-American male diagnosed with stage IIIb (T3, N2, M0) non-small cell lung cancer, squamous cell carcinoma presented with large central urine necrotic right upper lobe lung mass in addition to mediastinal and T3 and T4 vertebral body involvement and precarinal lymph node with PD-L1 expression of 40% diagnosed in January 2024. The patient  underwent a course of concurrent chemoradiation with weekly carboplatin for AUC of 2 and paclitaxel 45 Mg/M2 status post 7 cycles.  Last dose was given on July 28, 2022. The patient had repeat CT scan of the chest performed recently.  I personally and independently reviewed the scan images and discussed the result and showed the images to the patient and his wife. His scan showed significant improvement of his disease but there is development of progressive interstitial and septal thickening in the posterior right upper lobe with new dense consolidative airspace disease in the right lower lobe and enlarged subcarinal lymph node.  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 I recommended for the patient to start treatment with a tapered dose of prednisone over the next 5 weeks. I will repeat CT scan of the chest by the end of his steroid taper and if he has persistent concerning findings, we will consider The patient for a PET scan, biopsy or both at that time. For the dysphagia and odynophagia, he was referred to gastroenterology for evaluation. The patient was advised to call immediately if he has any other concerning symptoms in the interval.  The patient voices understanding of current disease status and treatment options and is in agreement with the current care plan.  All questions were answered. The patient knows to call the clinic with any problems, questions or concerns. We can certainly see the patient much sooner if necessary.  The total time spent in the appointment was 30 minutes.  Disclaimer: This note was dictated with voice recognition software. Similar sounding words can inadvertently be transcribed and may not be corrected upon review.

## 2022-09-07 ENCOUNTER — Encounter: Payer: Self-pay | Admitting: Radiation Oncology

## 2022-09-07 NOTE — Progress Notes (Signed)
Radiation Oncology         [phone] ________________________________  Name: [name] MRN: [id number]  Date: 09/08/2022  DOB: May 06, 1941  Follow-Up Visit Note  CC: Daniel Bussing, MD  Daniel Gaul, MD  No diagnosis found.  Diagnosis: The primary encounter diagnosis was Malignant neoplasm of right upper lobe of lung (HCC). A diagnosis of Squamous cell carcinoma of right lung (HCC) was also pertinent to this visit.   Stage IIIB (T3, N2, M0) non-small cell right lung cancer, squamous cell carcinoma.  The patient presented with a large centrally necrotic right upper lobe lung mass abutting the mediastinum, T3 and T4 vertebral bodies, and a hypermetabolic precarinal lymph node.     Interval Since Last Radiation: 1 month and 1 day  Indication for treatment: Curative       Radiation treatment dates: 06/22/22 through 08/08/22  Site/dose:  1) Right lung - treated with 60 Gy delivered in 30 Fx at 2 Gy/Fx 2) Right lung boost - treated with 6 Gy delivered in 3 Fx at 2 Gy/Fx Beams/energy: 6X  Narrative:  The patient returns today for routine follow-up. The patient tolerated radiation treatment relatively well. On the date of his final treatment, the patient reported mild fatigue and dry skin. Towards the end of his treatment he also experienced some esophageal issues which were somewhat helped with Carafate suspension.   The patient has now also completed concurrent chemotherapy consisting of carboplatin and paclitaxel x 7 cycles on 07/28/22 (first dose on 06/20/22) under Dr. Arbutus Ped. During his most recent follow-up visit with Dr. Arbutus Ped on 09/01/22, the patient endorsed a persistent dry cough in addition to odynophagia and dysphagia. He also reported minimal improvement on carafate, along with exertional dyspnea, and lack of appetite with subsequent weight loss.   The patient recently had a restaging CT scan performed on 08/29/22 which demonstrated a mixed treatment response,  characterized by: an interval decrease in size of the suprahilar medial right upper lobe pulmonary lesion (now largely cavitary appearing); marked progression of interstitial and septal thickening in the posterior right upper lobe along the fissure, associated with new dense consolidative airspace disease in the right lower lobe with a masslike configuration measuring 4.8 x 3.9 cm; substantial surrounding interstitial and airspace disease in the right lower lobe; a new nodular collection of consolidative opacity in the suprahilar left upper lobe measuring 2.1 x 1.6 cm; and new subcarinal lymphadenopathy. Differential considerations for the new and progressive findings noted above include radiation changes/pneumonitis (likely), an infectious/inflammatory etiology, or even possibly progressive and or metastatic disease.   In light of CT findings, Dr. Arbutus Ped has started him on a prednisone taper x 5 weeks. He will also have a repeat chest CT performed at the end of his steroid taper. If repeat imaging shows persistent concerning findings, Dr. Arbutus Ped may consider proceeding with a PET scan and/or biopsies.   He also had a swallow study performed on 08/31/22 (ordered by his PCP) that showed findings concerning for aspiration and dysphagia. He was referred to gastroenterology for evaluation of his condition but has not seen them yet.             ***                   Allergies:  is allergic to azithromycin, lisinopril, ezetimibe-simvastatin, metoprolol, and rosuvastatin.  Meds: Current Outpatient Medications  Medication Sig Dispense Refill   acetaminophen (TYLENOL) 500 MG tablet Take 500 mg by mouth every 6 (six) hours  as needed for mild pain or moderate pain.     amLODipine (NORVASC) 10 MG tablet Take 1 tablet (10 mg total) by mouth daily. 30 tablet 0   amoxicillin (AMOXIL) 500 MG capsule Take 2,000 mg by mouth.  4 pills before dental procedure for 1 days     aspirin EC 81 MG EC tablet Take 1  tablet (81 mg total) by mouth daily.     atorvastatin (LIPITOR) 20 MG tablet Take 20 mg by mouth daily.     benzonatate (TESSALON) 100 MG capsule Take 1 capsule (100 mg total) by mouth 3 (three) times daily as needed for cough. 30 capsule 2   dextromethorphan-guaiFENesin (MUCINEX DM) 30-600 MG 12hr tablet Take 1 tablet by mouth 2 (two) times daily as needed for cough.     fluticasone-salmeterol (ADVAIR HFA) 115-21 MCG/ACT inhaler Inhale 2 puffs into the lungs 2 (two) times daily. 1 each 11   glucose blood test strip      glucose blood test strip as directed DX 250     hydrochlorothiazide (HYDRODIURIL) 12.5 MG tablet Take 1 tablet (12.5 mg total) by mouth daily. 30 tablet 0   lidocaine-prilocaine (EMLA) cream Apply 1 Application topically as needed. 30 g 2   metFORMIN (GLUCOPHAGE) 1000 MG tablet Take 1,000 mg by mouth 2 (two) times daily with a meal.     Multiple Vitamin (MULTIVITAMIN WITH MINERALS) TABS tablet Take 1 tablet by mouth daily.     predniSONE (DELTASONE) 10 MG tablet 6 tablet p.o. daily for 1 week followed by 5 tablets p.o. daily for 1 week followed by 3 tablets p.o. daily for 1 week, followed by 2 tablets p.o. daily for 1 week followed by 1 tablet p.o. daily for 1 week. 120 tablet 0   prochlorperazine (COMPAZINE) 10 MG tablet Take 1 tablet (10 mg total) by mouth every 6 (six) hours as needed. 30 tablet 2   SF 5000 PLUS 1.1 % CREA dental cream Place 1 Application onto teeth daily.  2   sucralfate (CARAFATE) 1 g tablet Take 1 tablet (1 g total) by mouth 4 (four) times daily -  with meals and at bedtime. Crush and dissolve in 10 mL of warm water, swallow 20 min before meals 120 tablet 1   tamsulosin (FLOMAX) 0.4 MG CAPS capsule Take 0.4 mg by mouth daily.     No current facility-administered medications for this encounter.    Physical Findings: The patient is in no acute distress. Patient is alert and oriented.  vitals were not taken for this visit. .  No significant changes. Lungs  are clear to auscultation bilaterally. Heart has regular rate and rhythm. No palpable cervical, supraclavicular, or axillary adenopathy. Abdomen soft, non-tender, normal bowel sounds.   Lab Findings: Lab Results  Component Value Date   WBC 7.9 08/29/2022   HGB 8.0 (L) 08/29/2022   HCT 24.1 (L) 08/29/2022   MCV 83.7 08/29/2022   PLT 447 (H) 08/29/2022    Radiographic Findings: DG SWALLOW FUNC OP MEDICARE SPEECH PATH  Result Date: 08/31/2022 Table formatting from the original result was not included. Modified Barium Swallow Study Patient Details Name: ALEEM ELZA MRN: [id number] Date of Birth: 1941-03-11 Today's Date: 08/31/2022 HPI/PMH: HPI: Treston Coker is a 82 y.o. male with PMH: stage IIb non small cell lung cancer, squamous cell carcinoma s/p chemoradiation treatment. (completed last treatment two weeks ago), heart murmur, aortic stenosis, PONV, essential hypertension. He presented to this OP MBS secondary to dysphagia with difficulty  swallowing pills, globus sensation and coughing. During interview prior to performing MBS, patient told SLP his swallowing issues started during chemoradiation treatment and although it does seem that they have improved, he is still having symptoms. When he swallows pills, he feels like they are getting lodged in throat and this results in discomfort and coughing with the liquids he is drinking with pills. He reports that he cannot sleep flat because he will cough and that he does not eat or drink much before bed because this will cause increased coughing at night. He does endorse becoming SOB when he is having a coughing episode. He does indicate decreased appetite but no significant weight loss. Clinical Impression: Clinical Impression: Patient presents with an oropharyngeal swallow that appears [facility] but with a suspected primary esophageal dysphagia. No penetration or aspiration observed with any of the tested barium consistencies and although patient did have a  trace amount of residuals in vallecular sinus after initial swallows with thicker liquids, he had good sensation of this and swallowed a second time which cleared all residuals. Esophageal dysphagia was not observed until patient swallowed 13mm barium tablet. It passed through PES without difficulty but then became lodged in upper esophagus. Attempts were made to transit pill, such as thin liquids, puree solids, nectar thick liquids but to no avail. Patient did have good sensation of pill being lodged and he would continue to try to swallow and at one point he massaged his neck, saying he does this at times to try to push pills down. During attempts to dislodge pill, some retrograde movement of barium observed in the upper esophagus but this remained below PES. SLP recommending patient f/u with MD and consider further esophageal assessment. Factors that may increase risk of adverse event in presence of aspiration Rubye Oaks & Clearance Coots 2021): Factors that may increase risk of adverse event in presence of aspiration Rubye Oaks & Clearance Coots 2021): Frail or deconditioned Recommendations/Plan: Swallowing Evaluation Recommendations Swallowing Evaluation Recommendations Recommendations: PO diet PO Diet Recommendation: Regular; Thin liquids (Level 0) Liquid Administration via: Cup; Straw Medication Administration: Other (Comment) Supervision: Other (comment) (as tolerated, crushed or cut in half might be better tolerated) Swallowing strategies  : Small bites/sips Postural changes: Stay upright 30-60 min after meals; Position pt fully upright for meals Recommended consults: Consider esophageal assessment Treatment Plan Treatment Plan Follow-up recommendations: No SLP follow up Functional status assessment: Patient has had a recent decline in their functional status and demonstrates the ability to make significant improvements in function in a reasonable and predictable amount of time. Recommendations Recommendations for follow up  therapy are one component of a multi-disciplinary discharge planning process, led by the attending physician.  Recommendations may be updated based on patient status, additional functional criteria and insurance authorization. Assessment: Orofacial Exam: Orofacial Exam Oral Cavity: Oral Hygiene: WFL Oral Cavity - Dentition: Adequate natural dentition Orofacial Anatomy: WFL Oral Motor/Sensory Function: WFL Anatomy: Anatomy: WFL Boluses Administered: Boluses Administered Boluses Administered: Thin liquids (Level 0); Mildly thick liquids (Level 2, nectar thick); Moderately thick liquids (Level 3, honey thick); Puree; Solid  Oral Impairment Domain: Oral Impairment Domain Lip Closure: No labial escape Tongue control during bolus hold: Cohesive bolus between tongue to palatal seal Bolus preparation/mastication: Timely and efficient chewing and mashing Bolus transport/lingual motion: Brisk tongue motion Oral residue: Complete oral clearance Location of oral residue : N/A Initiation of pharyngeal swallow : Valleculae  Pharyngeal Impairment Domain: Pharyngeal Impairment Domain Soft palate elevation: No bolus between soft palate (SP)/pharyngeal wall (PW) Laryngeal elevation:  Complete superior movement of thyroid cartilage with complete approximation of arytenoids to epiglottic petiole Anterior hyoid excursion: Complete anterior movement Epiglottic movement: Complete inversion Laryngeal vestibule closure: Complete, no air/contrast in laryngeal vestibule Pharyngeal stripping wave : Present - complete Pharyngoesophageal segment opening: Partial distention/partial duration, partial obstruction of flow Tongue base retraction: No contrast between tongue base and posterior pharyngeal wall (PPW) Pharyngeal residue: Complete pharyngeal clearance Location of pharyngeal residue: N/A  Esophageal Impairment Domain: Esophageal Impairment Domain Esophageal clearance upright position: Esophageal retention with retrograde flow below  pharyngoesophageal segment (PES) Pill: Esophageal Impairment Domain Esophageal clearance upright position: Esophageal retention with retrograde flow below pharyngoesophageal segment (PES) Penetration/Aspiration Scale Score: Penetration/Aspiration Scale Score 1.  Material does not enter airway: Thin liquids (Level 0); Mildly thick liquids (Level 2, nectar thick); Moderately thick liquids (Level 3, honey thick); Puree; Solid; Pill Compensatory Strategies: Compensatory Strategies Compensatory strategies: Yes Straw: Effective Effective Straw: Thin liquid (Level 0)   General Information: Caregiver present: No  Diet Prior to this Study: Regular; Thin liquids (Level 0)   Temperature : Normal   Respiratory Status: WFL   Supplemental O2: None (Room air)   History of Recent Intubation: No  Behavior/Cognition: Alert; Cooperative; Pleasant mood Self-Feeding Abilities: Able to self-feed Baseline vocal quality/speech: Normal Volitional Cough: Able to elicit Volitional Swallow: Able to elicit Exam Limitations: No limitations Goal Planning: No data recorded No data recorded No data recorded No data recorded Consulted and agree with results and recommendations: Patient Pain: Pain Assessment Facial Expression: 0 Body Movements: 0 Muscle Tension: 0 Compliance with ventilator (intubated pts.): 0 Vocalization (extubated pts.): N/A CPOT Total: 0 End of Session: Start Time:SLP Start Time (ACUTE ONLY): 1140 Stop Time: SLP Stop Time (ACUTE ONLY): 1200 Time Calculation:SLP Time Calculation (min) (ACUTE ONLY): 20 min Charges: SLP Evaluations $ SLP Speech Visit: 1 Visit SLP Evaluations $Outpatient MBS Swallow: 1 Procedure SLP visit diagnosis: SLP Visit Diagnosis: Dysphagia, pharyngoesophageal phase (R13.14) Past Medical History: Past Medical History: Diagnosis Date  Acute epididymo-orchitis   Aortic stenosis   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  Arthritis   Diabetes mellitus without complication   ED (erectile dysfunction)   Essential  hypertension, benign   Heart murmur   Hep C w/o coma, chronic   History of blood transfusion   History of DVT of lower extremity   Hx of echocardiogram   Echo (8/15):  Severe LVH, EF 60-65%, no RWMA, Gr 3 DD, AVR ok (mean 21 mmHg), MAC, mild LAE, mild to mod RAE  Hypercholesterolemia   PONV (postoperative nausea and vomiting)  Past Surgical History: Past Surgical History: Procedure Laterality Date  AORTIC VALVE REPLACEMENT N/A 11/19/2013  Procedure: AORTIC VALVE REPLACEMENT (AVR);  Surgeon: Alleen Borne, MD;  Location: [facility] OR;  Service: Open Heart Surgery;  Laterality: N/A;  BUNIONECTOMY Bilateral 05/09/2006  CARDIAC CATHETERIZATION  10/01/2013  COLONOSCOPY  01/07/2010  internal hemorrhoids.  Dr Arlyce Dice  ESOPHAGOGASTRODUODENOSCOPY N/A 04/24/2013  Procedure: ESOPHAGOGASTRODUODENOSCOPY (EGD);  Surgeon: Beverley Fiedler, MD;  Location[facility] ENDOSCOPY;  Service: Endoscopy;  Laterality: N/A;  INGUINAL HERNIA REPAIR Right   INTRAOPERATIVE TRANSESOPHAGEAL ECHOCARDIOGRAM N/A 11/19/2013  Procedure: INTRAOPERATIVE TRANSESOPHAGEAL ECHOCARDIOGRAM;  Surgeon: Alleen Borne, MD;  Location: [facility] OR;  Service: Open Heart Surgery;  Laterality: N/A;  IR IMAGING GUIDED PORT INSERTION  06/30/2022  KNEE ARTHROSCOPY Left 05/09/2009  Dr Leslee Home  LAMINECTOMY  06/09/2005  Dr Jeral Fruit. multiple lumbar level laminectomies.   LEFT AND RIGHT HEART CATHETERIZATION WITH CORONARY ANGIOGRAM N/A 10/01/2013  Procedure: LEFT AND RIGHT  HEART CATHETERIZATION WITH CORONARY ANGIOGRAM;  Surgeon: Donato Schultz, MD;  Location: [facility] CATH LAB;  Service: Cardiovascular;  Laterality: N/A;  SPINAL FUSION    lumbar  TEE WITHOUT CARDIOVERSION  11/07/2003  Aoritic valve sclerosis.   TONSILLECTOMY    VIDEO BRONCHOSCOPY WITH ENDOBRONCHIAL ULTRASOUND N/A 06/02/2022  Procedure: VIDEO BRONCHOSCOPY WITH ENDOBRONCHIAL ULTRASOUND;  Surgeon: Luciano Cutter, MD;  Location: [facility] OR;  Service: Thoracic;  Laterality: N/A; Angela Nevin, MA, CCC-SLP Speech Therapy CLINICAL DATA:  Provided  history: Dysphagia, unspecified type. Cough, unspecified type. EXAM: MODIFIED BARIUM SWALLOW TECHNIQUE: Different consistencies of barium were administered orally to the patient by the speech pathologist. Imaging of the pharynx was performed in the lateral projection. Rockie Neighbours, PA-C (supervised by Dr. Jackey Loge) was present in the fluoroscopy room and operated the fluoroscopy equipment. FLUOROSCOPY: Fluoroscopy time: 3 minutes 54 seconds (33.97 mGy). COMPARISON:  Chest CT 08/29/2022. FINDINGS: Different consistencies of barium were administered orally to the patient by the speech pathologist with fluoroscopic imaging of the pharynx from a lateral projection, as well as limited imaging of the esophagus in the lateral and PA projections. Rockie Neighbours, PA-C was present in the fluoroscopy room and operated the fluoroscopy equipment. The speech pathologist did not observe aspiration. Please refer to the speech pathologist's report for full details. A swallowed 13 mm barium tablet did not pass beyond the level of the upper esophagus (at ther thoracic inlet) despite a prolonged period of observation, and despite the patient taking additional liquid contrast. Impression #4 will be called to the ordering clinician or representative by the Radiologist Assistant, and communication documented in the PACS or [employer]. IMPRESSION: 1. Modified barium swallow as described 2. No aspiration was observed. 3. Please refer to the speech pathologist's report for complete details and recommendations. 4. A swallowed 13 mm barium tablet did not pass beyond the upper esophagus (at the level of the thoracic inlet) despite a prolonged period of observation, and despite the patient taking additional liquid contrast. Findings are concerning for a stricture or other obstructing lesion at this site, and an esophagram is recommended for further evaluation. Electronically Signed   By: Jackey Loge D.O.   On: 08/31/2022 13:24  CT  Chest W Contrast  Result Date: 08/31/2022 CLINICAL DATA:  Non-small-cell lung cancer.  Restaging. EXAM: CT CHEST WITH CONTRAST TECHNIQUE: Multidetector CT imaging of the chest was performed during intravenous contrast administration. RADIATION DOSE REDUCTION: This exam was performed according to the departmental dose-optimization program which includes automated exposure control, adjustment of the mA and/or kV according to patient size and/or use of iterative reconstruction technique. CONTRAST:  75mL OMNIPAQUE IOHEXOL 300 MG/ML  SOLN COMPARISON:  PET-CT 05/27/2022. FINDINGS: Cardiovascular: The heart size is normal. No substantial pericardial effusion. Coronary artery calcification is evident. Moderate atherosclerotic calcification is noted in the wall of the thoracic aorta. Status post aortic valve replacement. Right Port-A-Cath tip is positioned in the distal SVC. Mediastinum/Nodes: 11 mm short axis precarinal node measured previously is 11 mm short axis again today on 58/2. 13 mm short axis subcarinal node on 65/2 is new in the interval. No left hilar lymphadenopathy. No discrete lymphadenopathy in the right hilum. Circumferential wall thickening noted in the mid esophagus, new since prior. There is no axillary lymphadenopathy. Lungs/Pleura: Centrilobular and paraseptal emphysema evident. Suprahilar medial right upper lobe pulmonary lesion measured previously at 5.8 x 5.0 cm is now 4.6 x 3.9 cm. Lesion was confluent soft tissue density on prior imaging and is now largely  cavitary in appearance with thick irregular circumferential wall. Marked progression of interstitial and septal thickening in the posterior right upper lobe along the fissure. There is new dense consolidative airspace disease in the right lower lobe with a masslike configuration measuring 4.8 x 3.9 cm. Substantial surrounding interstitial and airspace disease is noted in the right lower lobe is well. Interstitial thickening extends inferiorly  to the right lung base. There is a new nodular collection of consolidative opacity in the suprahilar left upper lobe measuring 2.1 x 1.6 cm on image 40/5. No evidence for pleural effusion. Upper Abdomen: Bilateral renal cysts are incompletely visualized but similar to prior. Musculoskeletal: No worrisome lytic or sclerotic osseous abnormality. Degenerative changes noted in both shoulders. IMPRESSION: 1. Interval decrease in size of the suprahilar medial right upper lobe pulmonary lesion. The mass is now largely cavitary in appearance with thick irregular circumferential wall. 2. Marked progression of interstitial and septal thickening in the posterior right upper lobe along the fissure. This is associated with new dense consolidative airspace disease in the right lower lobe with a masslike configuration measuring 4.8 x 3.9 cm. Substantial surrounding interstitial and airspace disease in the right lower lobe as well. Findings may reflect an atypical marked response to radiation therapy, but infectious/inflammatory etiology or even tumor progression cannot be excluded. 3. New nodular collection of consolidative opacity in the suprahilar left upper lobe measuring 2.1 x 1.6 cm. This may also be infectious/inflammatory although metastatic disease not excluded. 4. New subcarinal lymphadenopathy.  Metastatic disease a concern. 5. Circumferential wall thickening in the mid esophagus, new since prior. Potentially radiation related. Esophagitis could have this appearance. Neoplasm not excluded. 6. Aortic Atherosclerosis (ICD10-I70.0) and Emphysema (ICD10-J43.9). Electronically Signed   By: Kennith Center M.D.   On: 08/31/2022 09:50    Impression: The primary encounter diagnosis was Malignant neoplasm of right upper lobe of lung (HCC). A diagnosis of Squamous cell carcinoma of right lung (HCC) was also pertinent to this visit.   Stage IIIB (T3, N2, M0) non-small cell right lung cancer, squamous cell carcinoma.  The patient  presented with a large centrally necrotic right upper lobe lung mass abutting the mediastinum, T3 and T4 vertebral bodies, and a hypermetabolic precarinal lymph node.     The patient is recovering from the effects of radiation.  ***  Plan:  ***   *** minutes of total time was spent for this patient encounter, including preparation, face-to-face counseling with the patient and coordination of care, physical exam, and documentation of the encounter. ____________________________________  Billie Lade, PhD, MD  This document serves as a record of services personally performed by Antony Blackbird, MD. It was created on his behalf by Neena Rhymes, a trained medical scribe. The creation of this record is based on the scribe's personal observations and the provider's statements to them. This document has been checked and approved by the attending provider.

## 2022-09-07 NOTE — Progress Notes (Signed)
  Radiation Oncology         [phone] ________________________________  Name: [name] MRN: [id number]  Date: 09/08/2022  DOB: July 24, 1940  End of Treatment Note  Diagnosis: The primary encounter diagnosis was Malignant neoplasm of right upper lobe of lung (HCC). A diagnosis of Squamous cell carcinoma of right lung (HCC) was also pertinent to this visit.   Stage IIIB (T3, N2, M0) non-small cell right lung cancer, squamous cell carcinoma.  The patient presented with a large centrally necrotic right upper lobe lung mass abutting the mediastinum, T3 and T4 vertebral bodies, and a hypermetabolic precarinal lymph node.       Indication for treatment: Curative       Radiation treatment dates: 06/22/22 through 08/08/22  Site/dose:  1) Right lung - treated with 60 Gy delivered in 30 Fx at 2 Gy/Fx 2) Right lung boost - treated with 6 Gy delivered in 3 Fx at 2 Gy/Fx Beams/energy: 6X  Narrative: The patient tolerated radiation treatment relatively well. On the date of his final treatment, the patient reported mild fatigue and dry skin. Towards the end of his treatment he also experienced some esophageal issues which was helped with Carafate suspension.  Plan: The patient has completed radiation treatment. The patient will return to radiation oncology clinic for routine followup in one month. I advised them to call or return sooner if they have any questions or concerns related to their recovery or treatment.  -----------------------------------  Billie Lade, PhD, MD  This document serves as a record of services personally performed by Antony Blackbird, MD. It was created on his behalf by Neena Rhymes, a trained medical scribe. The creation of this record is based on the scribe's personal observations and the provider's statements to them. This document has been checked and approved by the attending provider.

## 2022-09-08 ENCOUNTER — Other Ambulatory Visit (HOSPITAL_COMMUNITY): Payer: Self-pay | Admitting: Family Medicine

## 2022-09-08 ENCOUNTER — Encounter: Payer: Self-pay | Admitting: Radiation Oncology

## 2022-09-08 ENCOUNTER — Ambulatory Visit
Admission: RE | Admit: 2022-09-08 | Discharge: 2022-09-08 | Disposition: A | Discharge status: Home / Self Care | Payer: Medicare Other | Source: Ambulatory Visit | Attending: Radiation Oncology | Admitting: Radiation Oncology

## 2022-09-08 VITALS — BP 137/70 | HR 83 | Temp 97.2°F | Resp 18 | Ht 71.0 in | Wt 129.2 lb

## 2022-09-08 DIAGNOSIS — K222 Esophageal obstruction: Secondary | ICD-10-CM

## 2022-09-08 DIAGNOSIS — C7951 Secondary malignant neoplasm of bone: Secondary | ICD-10-CM | POA: Insufficient documentation

## 2022-09-08 DIAGNOSIS — C3491 Malignant neoplasm of unspecified part of right bronchus or lung: Secondary | ICD-10-CM

## 2022-09-08 DIAGNOSIS — Z923 Personal history of irradiation: Secondary | ICD-10-CM | POA: Insufficient documentation

## 2022-09-08 DIAGNOSIS — C3411 Malignant neoplasm of upper lobe, right bronchus or lung: Secondary | ICD-10-CM | POA: Diagnosis not present

## 2022-09-08 HISTORY — DX: Personal history of irradiation: Z92.3

## 2022-09-08 NOTE — Progress Notes (Addendum)
[name] is here today for follow up post radiation to the lung.  Lung Side: Right, patient completed treatment on 08/08/22.   Does the patient complain of any of the following: Pain: No Shortness of breath w/wo exertion: Yes, mostly on exertion.  Cough: Dry cough.  Hemoptysis: No Pain with swallowing: Yes, patient has appointment on 09/14/22 to have imaging to esophagus.  Swallowing/choking concerns: Yes, choking on food.  Appetite: Good, patient drinking Glucerna.   Weight:  Wt Readings from Last 3 Encounters:  09/08/22 129 lb 3.2 oz (58.6 kg)  09/01/22 133 lb 14.4 oz (60.7 kg)  08/25/22 128 lb (58.1 kg)    Energy Level: Improving.  Post radiation skin Changes: No     Additional comments if applicable: Patient currently on prednisone taper due to inflammation in the lungs.  Patient reports increase in blood sugars. Made patient aware increase in blood sugar could be related to prednisone taper. Patient voiced understanding.   BP 137/70 (BP Location: Right Arm, Patient Position: Sitting, Cuff Size: Normal)   Pulse 83   Temp (!) 97.2 F (36.2 C)   Resp 18   Ht 5\' 11"  (1.803 m)   Wt 129 lb 3.2 oz (58.6 kg)   SpO2 100%   BMI 18.02 kg/m

## 2022-09-14 ENCOUNTER — Ambulatory Visit (HOSPITAL_COMMUNITY)
Admission: RE | Admit: 2022-09-14 | Discharge: 2022-09-14 | Disposition: A | Discharge status: Home / Self Care | Payer: Medicare Other | Source: Ambulatory Visit | Attending: Family Medicine | Admitting: Family Medicine

## 2022-09-14 DIAGNOSIS — K2289 Other specified disease of esophagus: Secondary | ICD-10-CM | POA: Diagnosis not present

## 2022-09-14 DIAGNOSIS — K224 Dyskinesia of esophagus: Secondary | ICD-10-CM | POA: Diagnosis not present

## 2022-09-14 DIAGNOSIS — K222 Esophageal obstruction: Secondary | ICD-10-CM | POA: Insufficient documentation

## 2022-09-21 ENCOUNTER — Ambulatory Visit (INDEPENDENT_AMBULATORY_CARE_PROVIDER_SITE_OTHER): Payer: Medicare Other | Admitting: Podiatry

## 2022-09-21 VITALS — BP 165/82

## 2022-09-21 DIAGNOSIS — B351 Tinea unguium: Secondary | ICD-10-CM

## 2022-09-21 DIAGNOSIS — E1142 Type 2 diabetes mellitus with diabetic polyneuropathy: Secondary | ICD-10-CM

## 2022-09-21 DIAGNOSIS — L84 Corns and callosities: Secondary | ICD-10-CM | POA: Diagnosis not present

## 2022-09-21 DIAGNOSIS — M79675 Pain in left toe(s): Secondary | ICD-10-CM

## 2022-09-21 DIAGNOSIS — M79674 Pain in right toe(s): Secondary | ICD-10-CM | POA: Diagnosis not present

## 2022-09-21 DIAGNOSIS — Q828 Other specified congenital malformations of skin: Secondary | ICD-10-CM

## 2022-09-21 NOTE — Progress Notes (Signed)
  Subjective:  Patient ID: [name], male    DOB: Oct 14, 1940,  MRN: [id number]  [name] presents to clinic today for at risk foot care with history of diabetic neuropathy and callus(es) right lower extremity, porokeratotic lesion(s) left lower extremity, and painful mycotic nails. Painful toenails interfere with ambulation. Aggravating factors include wearing enclosed shoe gear. Pain is relieved with periodic professional debridement. Painful callus(es) and porokeratotic lesion(s) are aggravated when weightbearing with and without shoegear. Pain is relieved with periodic professional debridement.  Chief Complaint  Patient presents with   Nail Problem    [facility] BS-did not check today A1C-6.2 PCP-[name] PCP VST-3 weeks ago   New problem(s): None.   PCP is [name], [name], MD.  Allergies  Allergen Reactions   Azithromycin Swelling    Angioedema 05/2022    Lisinopril     Angioedema 05/2022   Ezetimibe-Simvastatin     Leg pains Restlessness at night     Metoprolol     bradycardia   Rosuvastatin     Discomfort, RESTLESS, CAN'T SLEEP     Review of Systems: Negative except as noted in the HPI.  Objective: No changes noted in today's physical examination. Vitals:   09/21/22 0930  BP: (!) 165/82   [name] is a pleasant 82 y.o. male thin build in NAD. AAO x 3.   Vascular Examination: CFT <3 seconds b/l. DP/PT pulses faintly palpable b/l. Skin temperature gradient warm to warm b/l. No pain with calf compression. No ischemia or gangrene. No cyanosis or clubbing noted b/l. No edema noted b/l LE.   Neurological Examination:  Protective sensation intact 5/5 intact bilaterally with 10g monofilament b/l. Vibratory sensation intact b/l. Proprioception intact bilaterally.  Dermatological Examination: Pedal skin is warm and supple b/l LE. No open wounds b/l LE. No interdigital macerations noted b/l LE.   Toenails 1-5 b/l elongated, discolored, dystrophic, thickened,  crumbly with subungual debris and tenderness to dorsal palpation.   Hyperkeratotic lesion(s) 1st metatarsal head right foot.  No erythema, no edema, no drainage, no fluctuance. Porokeratotic lesion(s) submet head 2 left foot and submet head 3 right foot. No erythema, no edema, no drainage, no fluctuance.  Musculoskeletal Examination: Muscle strength 5/5 to b/l LE. HAV with bunion bilaterally and hammertoes 2-5 b/l. Pes planus deformity noted bilateral LE. Patient ambulates independent of any assistive aids.  Radiographs: None  Assessment/Plan: 1. Pain due to onychomycosis of toenails of both feet   2. Porokeratosis   3. Callus   4. Diabetic peripheral neuropathy associated with type 2 diabetes mellitus (HCC)     -Consent given for treatment as described below: -Examined patient. -Continue supportive shoe gear daily. -Mycotic toenails 1-5 bilaterally were debrided in length and girth with sterile nail nippers and dremel without incident. -Callus(es) 1st metatarsal head right lower extremity pared utilizing sharp debridement with sterile blade without complication or incident. Total number debrided =1. -Porokeratotic lesion(s) submet head 2 left foot and submet head 3 left foot pared and enucleated with sterile currette without incident. Total number of lesions debrided=2. -Patient/POA to call should there be question/concern in the interim.   Return in about 3 months (around 12/22/2022).  Freddie Breech, DPM

## 2022-09-24 ENCOUNTER — Encounter: Payer: Self-pay | Admitting: Podiatry

## 2022-09-28 ENCOUNTER — Encounter (HOSPITAL_BASED_OUTPATIENT_CLINIC_OR_DEPARTMENT_OTHER): Payer: Self-pay | Admitting: Pulmonary Disease

## 2022-09-28 ENCOUNTER — Ambulatory Visit (INDEPENDENT_AMBULATORY_CARE_PROVIDER_SITE_OTHER): Payer: Medicare Other | Admitting: Pulmonary Disease

## 2022-09-28 VITALS — BP 118/62 | HR 100 | Temp 98.3°F | Ht 71.0 in | Wt 120.2 lb

## 2022-09-28 DIAGNOSIS — J449 Chronic obstructive pulmonary disease, unspecified: Secondary | ICD-10-CM | POA: Diagnosis not present

## 2022-09-28 DIAGNOSIS — J700 Acute pulmonary manifestations due to radiation: Secondary | ICD-10-CM

## 2022-09-28 DIAGNOSIS — J9601 Acute respiratory failure with hypoxia: Secondary | ICD-10-CM | POA: Diagnosis not present

## 2022-09-28 MED ORDER — PREDNISONE 10 MG PO TABS: ORAL_TABLET | ORAL | 0 refills | Status: DC

## 2022-09-28 MED ORDER — FLUTICASONE-SALMETEROL 230-21 MCG/ACT IN AERO: 2.0000 | INHALATION_SPRAY | Freq: Two times a day (BID) | RESPIRATORY_TRACT | 2 refills | Status: DC

## 2022-09-28 NOTE — Patient Instructions (Addendum)
Acute Hypoxemic Respiratory Failure --Ambulatory O2 with desaturations --ORDER 2L continous O2 via [place] with activity and sleep --Will also perform POC qualifying walk  Stage III squamous cell carcinoma of the lung, right  Radiation pneumonitis - worsening -Followed by Oncology. Had reviewed CT 08/29/22 with patient. Findings thought likely to be radiation induced pneumonitis in setting of good response to his primary tumor -Completed chemoradiation at this point -Currently on prednisone taper with plan to repeat imaging -Will restart prednisone taper due to worsening symptoms  Moderate emphysema/COPD  -START Advair 230-21 mcg TWO puffs in morning and evening -Restart prednisone taper

## 2022-09-28 NOTE — Progress Notes (Signed)
Subjective:   PATIENT ID: [name] GENDER: male DOB: 1941-01-24, MRN: [id number]  Chief Complaint  Patient presents with   Follow-up    Follow up. Patient states the inhaler he is using seems like it's cutting his breath more than helping him breathe.     Reason for Visit: Follow-up  [name] is a 82 year old male with squamous cell carcinoma of the right lung, aortic stenosis s/p AVR, HTN, DM2, HLD chronic hepatitis C, hepatocellular carcinoma s/p ablation 2020, hx DVT, iron deficiency who presents for for follow-up.  Synopsis He was recently evaluated by Oncology for paratracheal mass in 2023. He was referred in December after complaints of cough and had CXR demonstrating 3.7 x 5.6 x 3.1 cm right paratracheal density with mass effect on the trachea. Follow-up CT 05/05/22 with large apical lung mass concerning for malignancy seen. EBUS 06/02/22 consistent with squamous cell carcinoma of the lung. Started on radiation on 06/15/22 and  paclitaxel and carboplatin on 06/21/22.  06/27/22 Wife present and supportive. Since our last visit he is undergoing chemotherapy and radiation. Tolerating therapy fairly so far. Denies shortness of breath and wheezing. Unchanged cough that mainly nonproductive. Some chest congestion.   09/28/22 He reports worsening shortness of breath, nonproductive cough and wheezing. Has been compliant with Advair HFA. Currently on prednisone treatment and currently on 30 mg and feels his symptoms worsening with tapering down. He reports weight loss 9lbs. He has some problems swallowing but feels he is trying to keep up with his appetite. Currently on Glucerna.  Social History: Former smoker x 30 years. 1.5 ppd. Quit 30 years ago.  Retired Previously worked at [employer]  Outpatient Medications Prior to Visit  Medication Sig Dispense Refill   acetaminophen (TYLENOL) 500 MG tablet Take 500 mg by mouth every 6 (six) hours as needed for mild pain or moderate  pain.     amLODipine (NORVASC) 10 MG tablet Take 1 tablet (10 mg total) by mouth daily. 30 tablet 0   amoxicillin (AMOXIL) 500 MG capsule Take 2,000 mg by mouth.  4 pills before dental procedure for 1 days     aspirin EC 81 MG EC tablet Take 1 tablet (81 mg total) by mouth daily.     atorvastatin (LIPITOR) 20 MG tablet Take 20 mg by mouth daily.     glucose blood test strip      glucose blood test strip as directed DX 250     hydrochlorothiazide (HYDRODIURIL) 12.5 MG tablet Take 1 tablet (12.5 mg total) by mouth daily. 30 tablet 0   lidocaine-prilocaine (EMLA) cream Apply 1 Application topically as needed. 30 g 2   metFORMIN (GLUCOPHAGE) 1000 MG tablet Take 1,000 mg by mouth 2 (two) times daily with a meal.     Multiple Vitamin (MULTIVITAMIN WITH MINERALS) TABS tablet Take 1 tablet by mouth daily.     SF 5000 PLUS 1.1 % CREA dental cream Place 1 Application onto teeth daily.  2   sucralfate (CARAFATE) 1 g tablet Take 1 tablet (1 g total) by mouth 4 (four) times daily -  with meals and at bedtime. Crush and dissolve in 10 mL of warm water, swallow 20 min before meals 120 tablet 1   tamsulosin (FLOMAX) 0.4 MG CAPS capsule Take 0.4 mg by mouth daily.     fluticasone-salmeterol (ADVAIR HFA) 115-21 MCG/ACT inhaler Inhale 2 puffs into the lungs 2 (two) times daily. 1 each 11   predniSONE (DELTASONE) 10  MG tablet 6 tablet p.o. daily for 1 week followed by 5 tablets p.o. daily for 1 week followed by 3 tablets p.o. daily for 1 week, followed by 2 tablets p.o. daily for 1 week followed by 1 tablet p.o. daily for 1 week. 120 tablet 0   benzonatate (TESSALON) 100 MG capsule Take 1 capsule (100 mg total) by mouth 3 (three) times daily as needed for cough. (Patient not taking: Reported on 09/08/2022) 30 capsule 2   dextromethorphan-guaiFENesin (MUCINEX DM) 30-600 MG 12hr tablet Take 1 tablet by mouth 2 (two) times daily as needed for cough. (Patient not taking: Reported on 09/08/2022)     prochlorperazine  (COMPAZINE) 10 MG tablet Take 1 tablet (10 mg total) by mouth every 6 (six) hours as needed. (Patient not taking: Reported on 09/08/2022) 30 tablet 2   No facility-administered medications prior to visit.    Review of Systems  Constitutional:  Positive for weight loss. Negative for chills, diaphoresis, fever and malaise/fatigue.  HENT:  Negative for congestion.   Respiratory:  Positive for cough, shortness of breath and wheezing. Negative for hemoptysis and sputum production.   Cardiovascular:  Negative for chest pain, palpitations and leg swelling.     Objective:   Vitals:   09/28/22 0901  BP: 118/62  Pulse: 100  Temp: 98.3 F (36.8 C)  TempSrc: Oral  SpO2: 91%  Weight: 54.5 kg  Height: 5\' 11"  (1.803 m)   SpO2: 91 % O2 Device: None (Room air)  Physical Exam: General: Thin, cachectic-appearing, no acute distress HENT: [place], AT Eyes: EOMI, no scleral icterus Respiratory: Diminished but clear to auscultation bilaterally.  No crackles, wheezing or rales Cardiovascular: RRR, -M/R/G, no JVD Extremities:-Edema,-tenderness Neuro: AAO x4, CNII-XII grossly intact Psych: Normal mood, normal affect  Data Reviewed:  Imaging: CT Chest 05/05/22 - Large centrally necrotic mass medially in right lung apex. Moderate centrilobular and paraseptal emphysema MRI Brain 05/23/22 - No metastatic disease PET 05/27/22 - Hypermetabolic right apical lung mass and precarinal lymph node CT Chest 08/29/22 - Decrease in size of suprahilar RUL lesion, now with cavitation. Progression of interstitial and septal theicking in RUL fissure associated new airspace consolidation in RLL measuring 4.8 x 3.9 - possible related to post-radiation changes however inflammatory/infectious/malignancy cannot be excluded. New suprahilar LUL nodular collection measruing 2.1 x 1.6 cm. New subcarinal lymphadenopathy. Circumferential wall thickening in the mid esophagus, new compared PET 05/27/22  PFT: 06/27/22 FVC 3.29 (77%) FEV1  2.32 (76%) Ratio 71  TLC 79% RV 128% RV/TLC 154% DLCO 75% Interpretation: Borderline low-normal spirometry with air trapping and reduced DLCO. Clinically correlate with emphysema/COPD   Labs: CBC    Component Value Date/Time   WBC 7.9 08/29/2022 1050   WBC 8.5 06/03/2022 0243   RBC 2.88 (L) 08/29/2022 1050   HGB 8.0 (L) 08/29/2022 1050   HCT 24.1 (L) 08/29/2022 1050   PLT 447 (H) 08/29/2022 1050   MCV 83.7 08/29/2022 1050   MCH 27.8 08/29/2022 1050   MCHC 33.2 08/29/2022 1050   RDW 25.2 (H) 08/29/2022 1050   LYMPHSABS 0.7 08/29/2022 1050   MONOABS 1.2 (H) 08/29/2022 1050   EOSABS 0.4 08/29/2022 1050   BASOSABS 0.1 08/29/2022 1050        Assessment & Plan:   Discussion: 82 year old male with stage IIIB squamous cell carcinoma of the right lung, aortic stenosis s/p AVR, HTN, DM2, HLD chronic hepatitis C, hepatocellular carcinoma s/p ablation 2020, hx DVT, iron deficiency who presents for follow-up. Worsening respiratory symptoms  in setting of radiation pneumonitis and COPD. Compliant with ICS/LABA and will step up in strngth. Currently on steroid treatment for radiation pneumonitis and will restart taper since he has worsened. Ambulatory O2 demonstrated new O2 requirement that is acute. Previously had normal sats in clinic.  Acute Hypoxemic Respiratory Failure --Ambulatory O2 with desaturations --ORDER 2L continous O2 via [place] with activity and sleep --Will also perform POC qualifying walk  Stage III squamous cell carcinoma of the lung, right  Radiation pneumonitis - worsening -Followed by Oncology. Had reviewed CT 08/29/22 with patient. Findings thought likely to be radiation induced pneumonitis in setting of good response to his primary tumor -Completed chemoradiation at this point -Currently on prednisone taper with plan to repeat imaging -Will restart prednisone taper due to worsening symptoms  Moderate emphysema/COPD  -START Advair 230-21 mcg TWO puffs in morning and  evening -Restart prednisone taper  Cachexia Multifactorial with cancer and lung disease -Encouraged adding additional protein shakes. Currently on glucerna -Hopefully oxygen support will improve his current metabolic demands  Health Maintenance Immunization History  Administered Date(s) Administered   Influenza, High Dose Seasonal PF 02/03/2017, 02/06/2018, 02/05/2019   CT Lung Screen - not qualified with lung mass  No orders of the defined types were placed in this encounter.  Meds ordered this encounter  Medications   fluticasone-salmeterol (ADVAIR HFA) 230-21 MCG/ACT inhaler    Sig: Inhale 2 puffs into the lungs 2 (two) times daily.    Dispense:  36 each    Refill:  2    Please dispense 3 inhalers. Namebrand preferred.   predniSONE (DELTASONE) 10 MG tablet    Sig: 6 tablet p.o. daily for 1 week followed by 5 tablets p.o. daily for 1 week followed by 3 tablets p.o. daily for 1 week, followed by 2 tablets p.o. daily for 1 week followed by 1 tablet p.o. daily for 1 week.    Dispense:  120 tablet    Refill:  0    Return in about 3 months (around 12/29/2022).  I have spent a total time of 40-minutes on the day of the appointment including chart review, data review, collecting history, coordinating care and discussing medical diagnosis and plan with the patient/family. Past medical history, allergies, medications were reviewed. Pertinent imaging, labs and tests included in this note have been reviewed and interpreted independently by me.  Lanah Steines Mechele Collin, MD [facility] 09/28/2022 9:25 AM  Office Number [phone]

## 2022-10-03 ENCOUNTER — Encounter (HOSPITAL_COMMUNITY): Payer: Self-pay

## 2022-10-03 ENCOUNTER — Emergency Department (HOSPITAL_COMMUNITY): Payer: Medicare Other

## 2022-10-03 ENCOUNTER — Other Ambulatory Visit: Payer: Self-pay

## 2022-10-03 ENCOUNTER — Inpatient Hospital Stay (HOSPITAL_COMMUNITY)
Admission: EM | Admit: 2022-10-03 | Discharge: 2022-10-08 | DRG: 177 | Disposition: A | Discharge status: Hospice | Payer: Medicare Other | Attending: Internal Medicine | Admitting: Internal Medicine

## 2022-10-03 DIAGNOSIS — E1169 Type 2 diabetes mellitus with other specified complication: Secondary | ICD-10-CM | POA: Diagnosis present

## 2022-10-03 DIAGNOSIS — Z953 Presence of xenogenic heart valve: Secondary | ICD-10-CM | POA: Diagnosis not present

## 2022-10-03 DIAGNOSIS — Z1152 Encounter for screening for COVID-19: Secondary | ICD-10-CM | POA: Diagnosis not present

## 2022-10-03 DIAGNOSIS — Z881 Allergy status to other antibiotic agents status: Secondary | ICD-10-CM

## 2022-10-03 DIAGNOSIS — J189 Pneumonia, unspecified organism: Secondary | ICD-10-CM | POA: Diagnosis present

## 2022-10-03 DIAGNOSIS — Z79899 Other long term (current) drug therapy: Secondary | ICD-10-CM | POA: Diagnosis not present

## 2022-10-03 DIAGNOSIS — A419 Sepsis, unspecified organism: Secondary | ICD-10-CM

## 2022-10-03 DIAGNOSIS — R0902 Hypoxemia: Secondary | ICD-10-CM | POA: Diagnosis not present

## 2022-10-03 DIAGNOSIS — Z681 Body mass index (BMI) 19 or less, adult: Secondary | ICD-10-CM | POA: Diagnosis not present

## 2022-10-03 DIAGNOSIS — J700 Acute pulmonary manifestations due to radiation: Secondary | ICD-10-CM | POA: Diagnosis not present

## 2022-10-03 DIAGNOSIS — J9621 Acute and chronic respiratory failure with hypoxia: Secondary | ICD-10-CM | POA: Diagnosis not present

## 2022-10-03 DIAGNOSIS — Z515 Encounter for palliative care: Secondary | ICD-10-CM | POA: Diagnosis not present

## 2022-10-03 DIAGNOSIS — E43 Unspecified severe protein-calorie malnutrition: Secondary | ICD-10-CM | POA: Diagnosis not present

## 2022-10-03 DIAGNOSIS — J690 Pneumonitis due to inhalation of food and vomit: Principal | ICD-10-CM | POA: Diagnosis present

## 2022-10-03 DIAGNOSIS — E870 Hyperosmolality and hypernatremia: Secondary | ICD-10-CM | POA: Diagnosis present

## 2022-10-03 DIAGNOSIS — R64 Cachexia: Secondary | ICD-10-CM | POA: Diagnosis present

## 2022-10-03 DIAGNOSIS — K222 Esophageal obstruction: Secondary | ICD-10-CM | POA: Diagnosis present

## 2022-10-03 DIAGNOSIS — J9611 Chronic respiratory failure with hypoxia: Secondary | ICD-10-CM | POA: Diagnosis present

## 2022-10-03 DIAGNOSIS — I152 Hypertension secondary to endocrine disorders: Secondary | ICD-10-CM | POA: Diagnosis present

## 2022-10-03 DIAGNOSIS — J9601 Acute respiratory failure with hypoxia: Secondary | ICD-10-CM

## 2022-10-03 DIAGNOSIS — Y842 Radiological procedure and radiotherapy as the cause of abnormal reaction of the patient, or of later complication, without mention of misadventure at the time of the procedure: Secondary | ICD-10-CM | POA: Diagnosis present

## 2022-10-03 DIAGNOSIS — R6510 Systemic inflammatory response syndrome (SIRS) of non-infectious origin without acute organ dysfunction: Secondary | ICD-10-CM

## 2022-10-03 DIAGNOSIS — Z7984 Long term (current) use of oral hypoglycemic drugs: Secondary | ICD-10-CM | POA: Diagnosis not present

## 2022-10-03 DIAGNOSIS — R0689 Other abnormalities of breathing: Secondary | ICD-10-CM | POA: Diagnosis not present

## 2022-10-03 DIAGNOSIS — R1310 Dysphagia, unspecified: Secondary | ICD-10-CM | POA: Diagnosis not present

## 2022-10-03 DIAGNOSIS — R059 Cough, unspecified: Secondary | ICD-10-CM | POA: Diagnosis not present

## 2022-10-03 DIAGNOSIS — E876 Hypokalemia: Secondary | ICD-10-CM | POA: Diagnosis present

## 2022-10-03 DIAGNOSIS — R350 Frequency of micturition: Secondary | ICD-10-CM | POA: Diagnosis present

## 2022-10-03 DIAGNOSIS — C3491 Malignant neoplasm of unspecified part of right bronchus or lung: Secondary | ICD-10-CM | POA: Diagnosis not present

## 2022-10-03 DIAGNOSIS — R000 Tachycardia, unspecified: Secondary | ICD-10-CM | POA: Diagnosis not present

## 2022-10-03 DIAGNOSIS — Z9221 Personal history of antineoplastic chemotherapy: Secondary | ICD-10-CM

## 2022-10-03 DIAGNOSIS — E1165 Type 2 diabetes mellitus with hyperglycemia: Secondary | ICD-10-CM | POA: Diagnosis not present

## 2022-10-03 DIAGNOSIS — J984 Other disorders of lung: Secondary | ICD-10-CM | POA: Diagnosis present

## 2022-10-03 DIAGNOSIS — Z86718 Personal history of other venous thrombosis and embolism: Secondary | ICD-10-CM

## 2022-10-03 DIAGNOSIS — R627 Adult failure to thrive: Secondary | ICD-10-CM | POA: Diagnosis present

## 2022-10-03 DIAGNOSIS — D638 Anemia in other chronic diseases classified elsewhere: Secondary | ICD-10-CM | POA: Diagnosis present

## 2022-10-03 DIAGNOSIS — R739 Hyperglycemia, unspecified: Secondary | ICD-10-CM | POA: Diagnosis not present

## 2022-10-03 DIAGNOSIS — Z923 Personal history of irradiation: Secondary | ICD-10-CM

## 2022-10-03 DIAGNOSIS — Z7189 Other specified counseling: Secondary | ICD-10-CM

## 2022-10-03 DIAGNOSIS — E7800 Pure hypercholesterolemia, unspecified: Secondary | ICD-10-CM | POA: Diagnosis present

## 2022-10-03 DIAGNOSIS — R0603 Acute respiratory distress: Secondary | ICD-10-CM | POA: Diagnosis not present

## 2022-10-03 DIAGNOSIS — Z7951 Long term (current) use of inhaled steroids: Secondary | ICD-10-CM

## 2022-10-03 DIAGNOSIS — C3490 Malignant neoplasm of unspecified part of unspecified bronchus or lung: Secondary | ICD-10-CM | POA: Diagnosis present

## 2022-10-03 DIAGNOSIS — R509 Fever, unspecified: Secondary | ICD-10-CM | POA: Diagnosis not present

## 2022-10-03 DIAGNOSIS — R54 Age-related physical debility: Secondary | ICD-10-CM | POA: Diagnosis present

## 2022-10-03 DIAGNOSIS — Z8249 Family history of ischemic heart disease and other diseases of the circulatory system: Secondary | ICD-10-CM

## 2022-10-03 DIAGNOSIS — K7460 Unspecified cirrhosis of liver: Secondary | ICD-10-CM | POA: Diagnosis present

## 2022-10-03 DIAGNOSIS — Z66 Do not resuscitate: Secondary | ICD-10-CM | POA: Diagnosis not present

## 2022-10-03 DIAGNOSIS — Z743 Need for continuous supervision: Secondary | ICD-10-CM | POA: Diagnosis not present

## 2022-10-03 DIAGNOSIS — J449 Chronic obstructive pulmonary disease, unspecified: Secondary | ICD-10-CM | POA: Diagnosis not present

## 2022-10-03 DIAGNOSIS — R609 Edema, unspecified: Secondary | ICD-10-CM | POA: Diagnosis not present

## 2022-10-03 DIAGNOSIS — R531 Weakness: Secondary | ICD-10-CM | POA: Diagnosis not present

## 2022-10-03 DIAGNOSIS — J440 Chronic obstructive pulmonary disease with acute lower respiratory infection: Secondary | ICD-10-CM | POA: Diagnosis present

## 2022-10-03 DIAGNOSIS — E872 Acidosis, unspecified: Secondary | ICD-10-CM | POA: Diagnosis present

## 2022-10-03 DIAGNOSIS — K224 Dyskinesia of esophagus: Secondary | ICD-10-CM | POA: Diagnosis present

## 2022-10-03 DIAGNOSIS — J181 Lobar pneumonia, unspecified organism: Secondary | ICD-10-CM | POA: Diagnosis not present

## 2022-10-03 DIAGNOSIS — Z823 Family history of stroke: Secondary | ICD-10-CM

## 2022-10-03 DIAGNOSIS — Z833 Family history of diabetes mellitus: Secondary | ICD-10-CM

## 2022-10-03 DIAGNOSIS — E1159 Type 2 diabetes mellitus with other circulatory complications: Secondary | ICD-10-CM | POA: Diagnosis present

## 2022-10-03 DIAGNOSIS — Z8505 Personal history of malignant neoplasm of liver: Secondary | ICD-10-CM

## 2022-10-03 DIAGNOSIS — J432 Centrilobular emphysema: Secondary | ICD-10-CM | POA: Diagnosis not present

## 2022-10-03 DIAGNOSIS — Z888 Allergy status to other drugs, medicaments and biological substances status: Secondary | ICD-10-CM

## 2022-10-03 DIAGNOSIS — J441 Chronic obstructive pulmonary disease with (acute) exacerbation: Secondary | ICD-10-CM | POA: Diagnosis not present

## 2022-10-03 DIAGNOSIS — D500 Iron deficiency anemia secondary to blood loss (chronic): Secondary | ICD-10-CM | POA: Diagnosis not present

## 2022-10-03 DIAGNOSIS — Z981 Arthrodesis status: Secondary | ICD-10-CM

## 2022-10-03 DIAGNOSIS — E119 Type 2 diabetes mellitus without complications: Secondary | ICD-10-CM | POA: Diagnosis not present

## 2022-10-03 DIAGNOSIS — Z7982 Long term (current) use of aspirin: Secondary | ICD-10-CM

## 2022-10-03 DIAGNOSIS — Z87891 Personal history of nicotine dependence: Secondary | ICD-10-CM

## 2022-10-03 DIAGNOSIS — Z9981 Dependence on supplemental oxygen: Secondary | ICD-10-CM

## 2022-10-03 DIAGNOSIS — Z794 Long term (current) use of insulin: Secondary | ICD-10-CM | POA: Diagnosis not present

## 2022-10-03 DIAGNOSIS — T380X5A Adverse effect of glucocorticoids and synthetic analogues, initial encounter: Secondary | ICD-10-CM | POA: Diagnosis present

## 2022-10-03 LAB — BETA-HYDROXYBUTYRIC ACID: Beta-Hydroxybutyric Acid: 1.17 mmol/L — ABNORMAL HIGH (ref 0.05–0.27)

## 2022-10-03 LAB — URINALYSIS, W/ REFLEX TO CULTURE (INFECTION SUSPECTED)
Bacteria, UA: NONE SEEN
Bilirubin Urine: NEGATIVE
Glucose, UA: >=500 mg/dL — AB
Ketones, ur: NEGATIVE mg/dL
Nitrite: NEGATIVE
Protein, ur: 30 mg/dL — AB
Specific Gravity, Urine: 1.028 (ref 1.005–1.030)
pH: 5 (ref 5.0–8.0)

## 2022-10-03 LAB — CBC WITH DIFFERENTIAL/PLATELET
Abs Immature Granulocytes: 0.07 10*3/uL (ref 0.00–0.07)
Basophils Absolute: 0 10*3/uL (ref 0.0–0.1)
Basophils Relative: 0 %
Eosinophils Absolute: 0 10*3/uL (ref 0.0–0.5)
Eosinophils Relative: 0 %
HCT: 33 % — ABNORMAL LOW (ref 39.0–52.0)
Hemoglobin: 10.1 g/dL — ABNORMAL LOW (ref 13.0–17.0)
Immature Granulocytes: 1 %
Lymphocytes Relative: 2 %
Lymphs Abs: 0.2 10*3/uL — ABNORMAL LOW (ref 0.7–4.0)
MCH: 27.4 pg (ref 26.0–34.0)
MCHC: 30.6 g/dL (ref 30.0–36.0)
MCV: 89.7 fL (ref 80.0–100.0)
Monocytes Absolute: 0.7 10*3/uL (ref 0.1–1.0)
Monocytes Relative: 6 %
Neutro Abs: 10 10*3/uL — ABNORMAL HIGH (ref 1.7–7.7)
Neutrophils Relative %: 91 %
Platelets: 265 10*3/uL (ref 150–400)
RBC: 3.68 MIL/uL — ABNORMAL LOW (ref 4.22–5.81)
RDW: 21.8 % — ABNORMAL HIGH (ref 11.5–15.5)
WBC: 11 10*3/uL — ABNORMAL HIGH (ref 4.0–10.5)
nRBC: 0 % (ref 0.0–0.2)

## 2022-10-03 LAB — RESP PANEL BY RT-PCR (RSV, FLU A&B, COVID)  RVPGX2
Influenza A by PCR: NEGATIVE
Influenza B by PCR: NEGATIVE
Resp Syncytial Virus by PCR: NEGATIVE
SARS Coronavirus 2 by RT PCR: NEGATIVE

## 2022-10-03 LAB — COMPREHENSIVE METABOLIC PANEL
ALT: 14 U/L (ref 0–44)
AST: 16 U/L (ref 15–41)
Albumin: 2.4 g/dL — ABNORMAL LOW (ref 3.5–5.0)
Alkaline Phosphatase: 70 U/L (ref 38–126)
Anion gap: 12 (ref 5–15)
BUN: 35 mg/dL — ABNORMAL HIGH (ref 8–23)
CO2: 25 mmol/L (ref 22–32)
Calcium: 9.3 mg/dL (ref 8.9–10.3)
Chloride: 108 mmol/L (ref 98–111)
Creatinine, Ser: 1.22 mg/dL (ref 0.61–1.24)
GFR, Estimated: >60 mL/min (ref >60)
Glucose, Bld: 599 mg/dL (ref 70–99)
Potassium: 3.9 mmol/L (ref 3.5–5.1)
Sodium: 145 mmol/L (ref 135–145)
Total Bilirubin: 0.7 mg/dL (ref 0.3–1.2)
Total Protein: 7.6 g/dL (ref 6.5–8.1)

## 2022-10-03 LAB — CBG MONITORING, ED
Glucose-Capillary: 565 mg/dL (ref 70–99)
Glucose-Capillary: 493 mg/dL — ABNORMAL HIGH (ref 70–99)

## 2022-10-03 LAB — LACTIC ACID, PLASMA
Lactic Acid, Venous: 2.5 mmol/L (ref 0.5–1.9)
Lactic Acid, Venous: 1.9 mmol/L (ref 0.5–1.9)

## 2022-10-03 LAB — BRAIN NATRIURETIC PEPTIDE: B Natriuretic Peptide: 152 pg/mL — ABNORMAL HIGH (ref 0.0–100.0)

## 2022-10-03 LAB — APTT: aPTT: 29 seconds (ref 24–36)

## 2022-10-03 LAB — GLUCOSE, CAPILLARY
Glucose-Capillary: 478 mg/dL — ABNORMAL HIGH (ref 70–99)
Glucose-Capillary: 316 mg/dL — ABNORMAL HIGH (ref 70–99)

## 2022-10-03 LAB — PROTIME-INR
INR: 1.1 (ref 0.8–1.2)
Prothrombin Time: 14.8 seconds (ref 11.4–15.2)

## 2022-10-03 MED ORDER — ACETAMINOPHEN 650 MG RE SUPP: 650.0000 mg | Freq: Four times a day (QID) | RECTAL | Status: DC | PRN

## 2022-10-03 MED ORDER — ONDANSETRON HCL 4 MG PO TABS: 4.0000 mg | ORAL_TABLET | Freq: Four times a day (QID) | ORAL | Status: DC | PRN

## 2022-10-03 MED ORDER — INSULIN ASPART 100 UNIT/ML IJ SOLN
5.0000 [IU] | Freq: Once | INTRAMUSCULAR | Status: AC
  Administered 2022-10-03: 5 [IU] via SUBCUTANEOUS
  Filled 2022-10-03: qty 0.05

## 2022-10-03 MED ORDER — SODIUM CHLORIDE 0.9 % IV SOLN
2.0000 g | Freq: Once | INTRAVENOUS | Status: AC
  Administered 2022-10-03: 2 g via INTRAVENOUS
  Filled 2022-10-03: qty 12.5

## 2022-10-03 MED ORDER — INSULIN ASPART 100 UNIT/ML IJ SOLN
0.0000 [IU] | Freq: Three times a day (TID) | INTRAMUSCULAR | Status: DC
  Administered 2022-10-04: 5 [IU] via SUBCUTANEOUS
  Administered 2022-10-04: 2 [IU] via SUBCUTANEOUS
  Administered 2022-10-05: 5 [IU] via SUBCUTANEOUS
  Administered 2022-10-06: 1 [IU] via SUBCUTANEOUS
  Filled 2022-10-03: qty 0.09

## 2022-10-03 MED ORDER — GUAIFENESIN ER 600 MG PO TB12
600.0000 mg | ORAL_TABLET | Freq: Two times a day (BID) | ORAL | Status: DC | PRN
  Administered 2022-10-05: 600 mg via ORAL
  Filled 2022-10-03: qty 1

## 2022-10-03 MED ORDER — SODIUM CHLORIDE 0.9% FLUSH
3.0000 mL | Freq: Two times a day (BID) | INTRAVENOUS | Status: DC
  Administered 2022-10-03 – 2022-10-05 (×5): 3 mL via INTRAVENOUS
  Administered 2022-10-06: 10 mL via INTRAVENOUS
  Administered 2022-10-06 – 2022-10-08 (×3): 3 mL via INTRAVENOUS

## 2022-10-03 MED ORDER — SODIUM CHLORIDE 0.9 % IV SOLN: 2.0000 g | Freq: Once | INTRAVENOUS | Status: DC

## 2022-10-03 MED ORDER — SENNOSIDES-DOCUSATE SODIUM 8.6-50 MG PO TABS
1.0000 | ORAL_TABLET | Freq: Every evening | ORAL | Status: DC | PRN
  Administered 2022-10-08: 1 via ORAL
  Filled 2022-10-03: qty 1

## 2022-10-03 MED ORDER — ALBUTEROL SULFATE (2.5 MG/3ML) 0.083% IN NEBU
2.5000 mg | INHALATION_SOLUTION | RESPIRATORY_TRACT | Status: DC | PRN
  Administered 2022-10-05: 2.5 mg via RESPIRATORY_TRACT
  Filled 2022-10-03: qty 3

## 2022-10-03 MED ORDER — INSULIN ASPART 100 UNIT/ML IJ SOLN
0.0000 [IU] | Freq: Every day | INTRAMUSCULAR | Status: DC
  Administered 2022-10-03: 3 [IU] via SUBCUTANEOUS
  Administered 2022-10-04 – 2022-10-05 (×2): 2 [IU] via SUBCUTANEOUS
  Filled 2022-10-03: qty 0.05

## 2022-10-03 MED ORDER — VANCOMYCIN HCL 750 MG/150ML IV SOLN: 750.0000 mg | INTRAVENOUS | Status: DC

## 2022-10-03 MED ORDER — ONDANSETRON HCL 4 MG/2ML IJ SOLN: 4.0000 mg | Freq: Four times a day (QID) | INTRAMUSCULAR | Status: DC | PRN

## 2022-10-03 MED ORDER — LACTATED RINGERS IV SOLN: INTRAVENOUS | Status: AC

## 2022-10-03 MED ORDER — VANCOMYCIN HCL IN DEXTROSE 1-5 GM/200ML-% IV SOLN
1000.0000 mg | Freq: Once | INTRAVENOUS | Status: AC
  Administered 2022-10-03: 1000 mg via INTRAVENOUS
  Filled 2022-10-03: qty 200

## 2022-10-03 MED ORDER — LACTATED RINGERS IV BOLUS (SEPSIS)
1000.0000 mL | Freq: Once | INTRAVENOUS | Status: AC
  Administered 2022-10-03: 1000 mL via INTRAVENOUS

## 2022-10-03 MED ORDER — METRONIDAZOLE 500 MG/100ML IV SOLN
500.0000 mg | Freq: Two times a day (BID) | INTRAVENOUS | Status: DC
  Administered 2022-10-03 – 2022-10-04 (×2): 500 mg via INTRAVENOUS
  Filled 2022-10-03 (×2): qty 100

## 2022-10-03 MED ORDER — SODIUM CHLORIDE 0.9 % IV SOLN
2.0000 g | Freq: Two times a day (BID) | INTRAVENOUS | Status: DC
  Administered 2022-10-04 – 2022-10-06 (×5): 2 g via INTRAVENOUS
  Filled 2022-10-03 (×5): qty 12.5

## 2022-10-03 MED ORDER — ACETAMINOPHEN 325 MG PO TABS
650.0000 mg | ORAL_TABLET | Freq: Four times a day (QID) | ORAL | Status: DC | PRN
  Administered 2022-10-06: 650 mg via ORAL
  Filled 2022-10-03: qty 2

## 2022-10-03 MED ORDER — ENOXAPARIN SODIUM 40 MG/0.4ML IJ SOSY
40.0000 mg | PREFILLED_SYRINGE | INTRAMUSCULAR | Status: DC
  Administered 2022-10-03: 40 mg via SUBCUTANEOUS
  Filled 2022-10-03: qty 0.4

## 2022-10-03 NOTE — Progress Notes (Signed)
Pharmacy Antibiotic Note  [name] is a 82 y.o. male admitted on 10/03/2022 with sepsis.  Pharmacy has been consulted for vancomycin dosing.  Today, 10/03/22 WBC slightly elevated Febrile - Tmax 101.3 F Total body weight < IBW. Total weight used for dose calculations  Plan: Cefepime 2 g IV q12h + metronidazole 500 mg IV q12h Vancomycin 1000 mg loading dose given in ED. Initiate maintenance dose of 750 mg IV q24h for estimated AUC of 535 Goal vancomycin AUC 400-550. Check levels at steady state as needed MRSA PCR ordered  Height: 5\' 11"  (180.3 cm) Weight: 54.4 kg (120 lb) IBW/kg (Calculated) : 75.3  Temp (24hrs), Avg:102.2 F (39 C), Min:101.3 F (38.5 C), Max:103.1 F (39.5 C)  Recent Labs  Lab 10/03/22 1540 10/03/22 1543 10/03/22 1839  WBC  --  11.0*  --   CREATININE  --  1.22  --   LATICACIDVEN 2.5*  --  1.9    Estimated Creatinine Clearance: 37.8 mL/min (by C-G formula based on SCr of 1.22 mg/dL).    Allergies  Allergen Reactions   Azithromycin Swelling    Angioedema 05/2022    Lisinopril     Angioedema 05/2022   Ezetimibe-Simvastatin     Leg pains Restlessness at night     Metoprolol     bradycardia   Rosuvastatin     Discomfort, RESTLESS, CAN'T SLEEP     Antimicrobials this admission: cefepime 5/27 >>  Metronidazole 5/27 >> vancomycin 5/27 >>   Dose adjustments this admission:  Microbiology results: 5/27 BCx: Sent 5/27 UCx:   5/27 MRSA PCR: Conception Chancy, PharmD 10/03/2022 7:52 PM

## 2022-10-03 NOTE — Assessment & Plan Note (Addendum)
Presenting with fever, tachycardia, lactic acidosis.  Unclear infectious source although suspect pulmonary etiology given history of lung cancer with known RUL cavitary mass, recent concern for radiation pneumonitis, and aspiration risk.  Fever could also be secondary to malignancy.  UA not consistent with UTI.  COVID, flu, RSV negative.   -Continue broad-spectrum antibiotics with vancomycin, cefepime, Flagyl -Follow blood and urine cultures -Check full respiratory pathogen panel -Obtain CT chest with contrast -Continue IV fluid hydration overnight -Hold prednisone for now

## 2022-10-03 NOTE — H&P (Signed)
History and Physical    [name] NWG:[id number] DOB: April 02, 1941 DOA: 10/03/2022  PCP: Darrow Bussing, MD  Patient coming from: Home  I have personally briefly reviewed patient's old medical records in [facility]  Chief Complaint: Cough, generalized weakness  HPI: [name] is a 82 y.o. 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  Patient was found to have a large centrally necrotic right upper lobe lung mass in January 2024.  He was diagnosed with squamous cell carcinoma of the lung.  He underwent concurrent chemoradiation with carboplatin and paclitaxel s/p 7 cycles with last dose 07/28/2022.  He has had partial response.  There was concern for radiation pneumonitis resulting in acute hypoxemic respiratory failure.  He was seen by pulmonology and started on prednisone taper as well as 2 L supplemental O2 via [place] continuously last week.  Patient also had recent swallow study which showed evidence of small volume aspiration and findings suspicious for upper esophageal stricture.  Patient states today he began to have fevers at home.  He has had chills but no diaphoresis.  He has chronic cough which is nonproductive at baseline but today he began to have yellow sputum production with small streaks of blood.  He has not had any worsening shortness of breath compared to his baseline.  He denies nausea, vomiting, abdominal pain, diarrhea.  He has had increased urinary frequency without dysuria.    ED Course  Labs/Imaging on admission: I have personally reviewed following labs and imaging studies.  Initial vitals showed BP 129/76, pulse 101, RR 18, temp 101.3 F, SpO2 100% on 2 L O2 via .  Tmax 103.1 F rectally.  Labs  show sodium 145, potassium 3.9, bicarb 25, BUN 35, creatinine 1.22, serum glucose 599, anion gap 12, LFTs within normal limits, beta hydroxybutyrate 1.17, lactic acid 2.5, BNP 152.  SARS-CoV-2, influenza, RSV PCR negative.  Urinalysis shows negative nitrates, small leukocytes, 11-20 RBCs and WBCs, no bacteria microscopy.  Blood and urine cultures in process.  Portable chest x-ray shows chronic right lung volume loss with right upper lobe pleural-parenchymal opacity.  Cavitary lesion on CT is not well-defined by x-ray.  Improving right infrahilar opacity from prior CT noted.  Patient was given 1 L LR, IV vancomycin and cefepime.  The hospitalist service was consulted to admit for further evaluation and management.  Review of Systems: All systems reviewed and are negative except as documented in history of present illness above.   Past Medical History:  Diagnosis Date   Acute epididymo-orchitis    Aortic stenosis    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   Arthritis    Diabetes mellitus without complication [facility])    ED (erectile dysfunction)    Essential hypertension, benign    Heart murmur    Hep C w/o coma, chronic (HCC)    History of blood transfusion    History of DVT of lower extremity    History of radiation therapy    Right Lung- 06/22/22-08/08/22- Dr. Antony Blackbird   Hx of echocardiogram    Echo (8/15):  Severe LVH, EF 60-65%, no RWMA, Gr 3 DD, AVR ok (mean 21 mmHg), MAC, mild LAE, mild to mod RAE   Hypercholesterolemia  PONV (postoperative nausea and vomiting)     Past Surgical History:  Procedure Laterality Date   AORTIC VALVE REPLACEMENT N/A 11/19/2013   Procedure: AORTIC VALVE REPLACEMENT (AVR);  Surgeon: Alleen Borne, MD;  Location: [facility] OR;  Service: Open Heart Surgery;  Laterality: N/A;   BUNIONECTOMY Bilateral 05/09/2006   CARDIAC CATHETERIZATION  10/01/2013   COLONOSCOPY  01/07/2010   internal hemorrhoids.  Dr Arlyce Dice   ESOPHAGOGASTRODUODENOSCOPY N/A  04/24/2013   Procedure: ESOPHAGOGASTRODUODENOSCOPY (EGD);  Surgeon: Beverley Fiedler, MD;  Location: [facility] ENDOSCOPY;  Service: Endoscopy;  Laterality: N/A;   INGUINAL HERNIA REPAIR Right    INTRAOPERATIVE TRANSESOPHAGEAL ECHOCARDIOGRAM N/A 11/19/2013   Procedure: INTRAOPERATIVE TRANSESOPHAGEAL ECHOCARDIOGRAM;  Surgeon: Alleen Borne, MD;  Location: [facility] OR;  Service: Open Heart Surgery;  Laterality: N/A;   IR IMAGING GUIDED PORT INSERTION  06/30/2022   KNEE ARTHROSCOPY Left 05/09/2009   Dr Leslee Home   LAMINECTOMY  06/09/2005   Dr Jeral Fruit. multiple lumbar level laminectomies.    LEFT AND RIGHT HEART CATHETERIZATION WITH CORONARY ANGIOGRAM N/A 10/01/2013   Procedure: LEFT AND RIGHT HEART CATHETERIZATION WITH CORONARY ANGIOGRAM;  Surgeon: Donato Schultz, MD;  Location: [facility] CATH LAB;  Service: Cardiovascular;  Laterality: N/A;   SPINAL FUSION     lumbar   TEE WITHOUT CARDIOVERSION  11/07/2003   Aoritic valve sclerosis.    TONSILLECTOMY     VIDEO BRONCHOSCOPY WITH ENDOBRONCHIAL ULTRASOUND N/A 06/02/2022   Procedure: VIDEO BRONCHOSCOPY WITH ENDOBRONCHIAL ULTRASOUND;  Surgeon: Luciano Cutter, MD;  Location: [facility] OR;  Service: Thoracic;  Laterality: N/A;    Social History:  reports that he quit smoking about 31 years ago. His smoking use included cigarettes. He has a 46.50 pack-year smoking history. He has never used smokeless tobacco. He reports that he does not drink alcohol and does not use drugs.  Allergies  Allergen Reactions   Azithromycin Swelling    Angioedema 05/2022    Lisinopril     Angioedema 05/2022   Ezetimibe-Simvastatin     Leg pains Restlessness at night     Metoprolol     bradycardia   Rosuvastatin     Discomfort, RESTLESS, CAN'T SLEEP     Family History  Problem Relation Age of Onset   Diabetes Father    Stroke Father    Diabetes Mother    Stroke Mother    Hypertension Unknown        family history   Heart attack Neg Hx      Prior to Admission medications    Medication Sig Start Date End Date Taking? Authorizing Provider  acetaminophen (TYLENOL) 500 MG tablet Take 500 mg by mouth every 6 (six) hours as needed for mild pain or moderate pain.    [provider]  amLODipine (NORVASC) 10 MG tablet Take 1 tablet (10 mg total) by mouth daily. 06/06/22   Amin, Loura Halt, MD  aspirin EC 81 MG EC tablet Take 1 tablet (81 mg total) by mouth daily. 11/24/13   Gold, Wayne E, PA-C  atorvastatin (LIPITOR) 20 MG tablet Take 20 mg by mouth daily.    [provider]  benzonatate (TESSALON) 100 MG capsule Take 1 capsule (100 mg total) by mouth 3 (three) times daily as needed for cough. Patient not taking: Reported on 09/08/2022 06/22/22   Heilingoetter, Cassandra L, PA-C  dextromethorphan-guaiFENesin (MUCINEX DM) 30-600 MG 12hr tablet Take 1 tablet by mouth 2 (two) times daily as needed for cough. Patient not taking: Reported on 09/08/2022  [provider]  fluticasone-salmeterol (ADVAIR HFA) 230-21 MCG/ACT inhaler Inhale 2 puffs into the lungs 2 (two) times daily. 09/28/22   Luciano Cutter, MD  glucose blood test strip  01/15/14   [provider]  glucose blood test strip as directed DX 250 01/15/14   [provider]  hydrochlorothiazide (HYDRODIURIL) 12.5 MG tablet Take 1 tablet (12.5 mg total) by mouth daily. 06/06/22   Amin, Loura Halt, MD  lidocaine-prilocaine (EMLA) cream Apply 1 Application topically as needed. 06/17/22   Heilingoetter, Cassandra L, PA-C  metFORMIN (GLUCOPHAGE) 1000 MG tablet Take 1,000 mg by mouth 2 (two) times daily with a meal.    [provider]  Multiple Vitamin (MULTIVITAMIN WITH MINERALS) TABS tablet Take 1 tablet by mouth daily.    [provider]  predniSONE (DELTASONE) 10 MG tablet 6 tablet p.o. daily for 1 week followed by 5 tablets p.o. daily for 1 week followed by 3 tablets p.o. daily for 1 week, followed by 2 tablets p.o. daily for 1 week followed by 1 tablet p.o. daily for 1  week. 09/28/22   Luciano Cutter, MD  prochlorperazine (COMPAZINE) 10 MG tablet Take 1 tablet (10 mg total) by mouth every 6 (six) hours as needed. Patient not taking: Reported on 09/08/2022 06/09/22   Heilingoetter, Cassandra L, PA-C  SF 5000 PLUS 1.1 % CREA dental cream Place 1 Application onto teeth daily. 02/20/18   [provider]  sucralfate (CARAFATE) 1 g tablet Take 1 tablet (1 g total) by mouth 4 (four) times daily -  with meals and at bedtime. Crush and dissolve in 10 mL of warm water, swallow 20 min before meals 07/12/22   Antony Blackbird, MD  tamsulosin (FLOMAX) 0.4 MG CAPS capsule Take 0.4 mg by mouth daily. 12/24/19   [provider]    Physical Exam: Vitals:   10/03/22 1555 10/03/22 1645 10/03/22 1700 10/03/22 1900  BP:  135/80 (!) 143/82 (!) 141/90  Pulse:  93 85 87  Resp:  15 10 14   Temp: (!) 103.1 F (39.5 C)     TempSrc: Rectal     SpO2:  100% 98% 99%  Weight:      Height:       Constitutional: Cachectic man sitting up in bed, NAD, calm, comfortable Eyes: EOMI, lids and conjunctivae normal ENMT: Mucous membranes are moist. Posterior pharynx clear of any exudate or lesions.poor dentition.  Neck: normal, supple, no masses. Respiratory: clear to auscultation bilaterally, no wheezing, no crackles. Normal respiratory effort. No accessory muscle use.  Cardiovascular: Regular rate and rhythm, no murmurs / rubs / gallops. No extremity edema. 2+ pedal pulses. Abdomen: no tenderness, no masses palpated.  Musculoskeletal: no clubbing / cyanosis. No joint deformity upper and lower extremities. Good ROM, no contractures. Normal muscle tone.  Thin extremities with muscle wasting throughout. Skin: Dry, no rashes, lesions, ulcers. No induration Neurologic:Sensation intact. Strength 5/5 in all 4.  Psychiatric: Alert and oriented x 3. Normal mood.   EKG: Personally reviewed. Sinus rhythm, rate 100, LVH, no acute ischemic changes.  PVCs no longer present when compared to  prior.  Assessment/Plan Principal Problem:   SIRS (systemic inflammatory response syndrome) (HCC) Active Problems:   Acute hypoxemic respiratory failure (HCC)   Type 2 diabetes mellitus with hyperglycemia, without long-term current use of insulin (HCC)   Dysphagia   Squamous cell carcinoma of lung (HCC)   Moderate COPD (chronic obstructive pulmonary disease) (HCC)   Hypertension associated with diabetes (HCC)  Hyperlipidemia associated with type 2 diabetes mellitus (HCC)   [name] is a 82 y.o. 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  Assessment and Plan: * SIRS (systemic inflammatory response syndrome) (HCC) Presenting with fever, tachycardia, lactic acidosis.  Unclear infectious source although suspect pulmonary etiology given history of lung cancer with known RUL cavitary mass, recent concern for radiation pneumonitis, and aspiration risk.  Fever could also be secondary to malignancy.  UA not consistent with UTI.  COVID, flu, RSV negative.   -Continue broad-spectrum antibiotics with vancomycin, cefepime, Flagyl -Follow blood and urine cultures -Check full respiratory pathogen panel -Obtain CT chest with contrast -Continue IV fluid hydration overnight -Hold prednisone for now  Acute hypoxemic respiratory failure (HCC) Secondary to history of COPD plus concern for radiation pneumonitis.  Recently placed on continuous 2 L O2 via [place] as an outpatient by pulmonology.  SpO2 stable on 2 L via [place].  Dysphagia Recent swallow study with evidence for small volume aspiration as well as concern for esophageal stricture.  Patient does report occasional choking episodes when eating. -SLP eval  Type 2 diabetes mellitus with hyperglycemia, without long-term current use of  insulin (HCC) Hyperglycemic in setting of prednisone use.  No evidence of DKA/HHS. -5 units NovoLog now -Placed on SSI, hold metformin -Continue IV fluids overnight  Squamous cell carcinoma of lung (HCC) Follows with oncology Dr. Arbutus Ped.  Chemoradiation on pause due to concern for radiation pneumonitis.  Moderate COPD (chronic obstructive pulmonary disease) (HCC) No significant wheezing on admission.  Continue Advair and albuterol as needed.  Hyperlipidemia associated with type 2 diabetes mellitus (HCC) Continue atorvastatin.  Hypertension associated with diabetes (HCC) BP stable.  Continue amlodipine, hold HCTZ for now.  DVT prophylaxis: enoxaparin (LOVENOX) injection 40 mg Start: 10/03/22 2200 Code Status: DNR, confirmed with patient on admission Family Communication: Spouse at bedside Disposition Plan: From home, dispo pending clinical progress Consults called: None Severity of Illness: The appropriate patient status for this patient is OBSERVATION. Observation status is judged to be reasonable and necessary in order to provide the required intensity of service to ensure the patient's safety. The patient's presenting symptoms, physical exam findings, and initial radiographic and laboratory data in the context of their medical condition is felt to place them at decreased risk for further clinical deterioration. Furthermore, it is anticipated that the patient will be medically stable for discharge from the hospital within 2 midnights of admission.   Darreld Mclean MD [facility]  If 7PM-7AM, please contact night-coverage [email or web address]  10/03/2022, 7:43 PM

## 2022-10-03 NOTE — Assessment & Plan Note (Signed)
BP stable.  Continue amlodipine, hold HCTZ for now.

## 2022-10-03 NOTE — Assessment & Plan Note (Signed)
Continue atorvastatin

## 2022-10-03 NOTE — Assessment & Plan Note (Addendum)
Follows with oncology Dr. Arbutus Ped.  Chemoradiation on pause due to concern for radiation pneumonitis.

## 2022-10-03 NOTE — ED Provider Notes (Signed)
[place] [facility]ERGENCY DEPARTMENT AT [facility] Provider Note   CSN: [id number] Arrival date & time: 10/03/22  1519     History  No chief complaint on file.   [name] is a 82 y.o. male.  Patient with history of squamous cell carcinoma of the right lung with last chemotherapy 1 month ago with port in right chest, aortic stenosis s/p AVR, HTN, DM2, HLD, chronic hepatitis C, hepatocellular carcinoma s/p ablation 2020, DVT, iron deficiency, COPD presents today with concern for sepsis. Patients wife at bedside provides most of history, states that he has been feeling generally unwell for the past 4 days with increased productive cough. He has not been eating as well for the past few days either. EMS noted that he was hyperglycemic and CBG read 'high.' Does state that he has been taking his medications as prescribed. He is not on insulin at home. He normally checks his blood sugar every day but has not been over the past few days due to feeling unwell and not eating much. Patients wife states that his fever started today. Denies nausea, vomiting, diarrhea, abdominal pain, or dysuria. Additionally, patients wife notes that he was started on 2L oxygen last Wednesday by his pulmonologist and has been on that consistently since then without any additional oxygen requirement. Additionally, wife does note that the patient has had left ankle swelling since last Saturday which is new. No history of same. Patient denies any pain to this area.  The history is provided by the patient. No language interpreter was used.       Home Medications Prior to Admission medications   Medication Sig Start Date End Date Taking? Authorizing Provider  acetaminophen (TYLENOL) 500 MG tablet Take 500 mg by mouth every 6 (six) hours as needed for mild pain or moderate pain.    [provider]  amLODipine (NORVASC) 10 MG tablet Take 1 tablet (10 mg total) by mouth daily. 06/06/22   Amin, Loura Halt,  MD  aspirin EC 81 MG EC tablet Take 1 tablet (81 mg total) by mouth daily. 11/24/13   Gold, Wayne E, PA-C  atorvastatin (LIPITOR) 20 MG tablet Take 20 mg by mouth daily.    [provider]  benzonatate (TESSALON) 100 MG capsule Take 1 capsule (100 mg total) by mouth 3 (three) times daily as needed for cough. Patient not taking: Reported on 09/08/2022 06/22/22   Heilingoetter, Cassandra L, PA-C  dextromethorphan-guaiFENesin (MUCINEX DM) 30-600 MG 12hr tablet Take 1 tablet by mouth 2 (two) times daily as needed for cough. Patient not taking: Reported on 09/08/2022    [provider]  fluticasone-salmeterol (ADVAIR HFA) 230-21 MCG/ACT inhaler Inhale 2 puffs into the lungs 2 (two) times daily. 09/28/22   Luciano Cutter, MD  glucose blood test strip  01/15/14   [provider]  glucose blood test strip as directed DX 250 01/15/14   [provider]  hydrochlorothiazide (HYDRODIURIL) 12.5 MG tablet Take 1 tablet (12.5 mg total) by mouth daily. 06/06/22   Amin, Loura Halt, MD  lidocaine-prilocaine (EMLA) cream Apply 1 Application topically as needed. 06/17/22   Heilingoetter, Cassandra L, PA-C  metFORMIN (GLUCOPHAGE) 1000 MG tablet Take 1,000 mg by mouth 2 (two) times daily with a meal.    [provider]  Multiple Vitamin (MULTIVITAMIN WITH MINERALS) TABS tablet Take 1 tablet by mouth daily.    [provider]  predniSONE (DELTASONE) 10 MG tablet 6 tablet p.o. daily for 1 week followed  by 5 tablets p.o. daily for 1 week followed by 3 tablets p.o. daily for 1 week, followed by 2 tablets p.o. daily for 1 week followed by 1 tablet p.o. daily for 1 week. 09/28/22   Luciano Cutter, MD  prochlorperazine (COMPAZINE) 10 MG tablet Take 1 tablet (10 mg total) by mouth every 6 (six) hours as needed. Patient not taking: Reported on 09/08/2022 06/09/22   Heilingoetter, Cassandra L, PA-C  SF 5000 PLUS 1.1 % CREA dental cream Place 1 Application onto teeth daily. 02/20/18    [provider]  sucralfate (CARAFATE) 1 g tablet Take 1 tablet (1 g total) by mouth 4 (four) times daily -  with meals and at bedtime. Crush and dissolve in 10 mL of warm water, swallow 20 min before meals 07/12/22   Antony Blackbird, MD  tamsulosin (FLOMAX) 0.4 MG CAPS capsule Take 0.4 mg by mouth daily. 12/24/19   [provider]      Allergies    Azithromycin, Lisinopril, Ezetimibe-simvastatin, Metoprolol, and Rosuvastatin    Review of Systems   Review of Systems  Constitutional:  Positive for fever.  Respiratory:  Positive for cough.   All other systems reviewed and are negative.   Physical Exam Updated Vital Signs BP (!) 143/82   Pulse 85   Temp (!) 103.1 F (39.5 C) (Rectal)   Resp 10   Ht 5\' 11"  (1.803 m)   Wt 54.4 kg   SpO2 98%   BMI 16.74 kg/m  Physical Exam Vitals and nursing note reviewed.  Constitutional:      General: He is not in acute distress.    Appearance: Normal appearance. He is normal weight. He is not ill-appearing, toxic-appearing or diaphoretic.  HENT:     Head: Normocephalic and atraumatic.  Cardiovascular:     Rate and Rhythm: Tachycardia present.     Comments: Internal port site noted to the right upper chest non-infectious appearing Pulmonary:     Effort: Pulmonary effort is normal. No respiratory distress.  Abdominal:     General: Abdomen is flat.     Palpations: Abdomen is soft.     Tenderness: There is no abdominal tenderness.  Musculoskeletal:        General: Normal range of motion.     Cervical back: Normal range of motion.     Comments: 2+ pitting edema noted to the left lower extremity. 1+ in the right. No tenderness, erythema, or warmth. ROM intact without pain.  DP and PT pulses intact and 2+.  Skin:    General: Skin is warm and dry.  Neurological:     General: No focal deficit present.     Mental Status: He is alert and oriented to person, place, and time.  Psychiatric:        Mood and Affect: Mood normal.         Behavior: Behavior normal.     ED Results / Procedures / Treatments   Labs (all labs ordered are listed, but only abnormal results are displayed) Labs Reviewed  COMPREHENSIVE METABOLIC PANEL - Abnormal; Notable for the following components:      Result Value   Glucose, Bld 599 (*)    BUN 35 (*)    Albumin 2.4 (*)    All other components within normal limits  LACTIC ACID, PLASMA - Abnormal; Notable for the following components:   Lactic Acid, Venous 2.5 (*)    All other components within normal limits  CBC WITH DIFFERENTIAL/PLATELET - Abnormal; Notable  for the following components:   WBC 11.0 (*)    RBC 3.68 (*)    Hemoglobin 10.1 (*)    HCT 33.0 (*)    RDW 21.8 (*)    Neutro Abs 10.0 (*)    Lymphs Abs 0.2 (*)    All other components within normal limits  URINALYSIS, W/ REFLEX TO CULTURE (INFECTION SUSPECTED) - Abnormal; Notable for the following components:   Glucose, UA >=500 (*)    Hgb urine dipstick SMALL (*)    Protein, ur 30 (*)    Leukocytes,Ua SMALL (*)    All other components within normal limits  BETA-HYDROXYBUTYRIC ACID - Abnormal; Notable for the following components:   Beta-Hydroxybutyric Acid 1.17 (*)    All other components within normal limits  BRAIN NATRIURETIC PEPTIDE - Abnormal; Notable for the following components:   B Natriuretic Peptide 152.0 (*)    All other components within normal limits  CBG MONITORING, ED - Abnormal; Notable for the following components:   Glucose-Capillary 565 (*)    All other components within normal limits  RESP PANEL BY RT-PCR (RSV, FLU A&B, COVID)  RVPGX2  CULTURE, BLOOD (ROUTINE X 2)  CULTURE, BLOOD (ROUTINE X 2)  URINE CULTURE  PROTIME-INR  APTT  LACTIC ACID, PLASMA  I-STAT VENOUS BLOOD GAS, ED  CBG MONITORING, ED    EKG EKG Interpretation  Date/Time:  Monday Oct 03 2022 15:33:53 EDT Ventricular Rate:  100 PR Interval:  142 QRS Duration: 80 QT Interval:  322 QTC Calculation: 416 R Axis:   48 Text  Interpretation: Sinus tachycardia Left ventricular hypertrophy Anterior Q waves, possibly due to LVH Confirmed by Virgina Norfolk ([id number]) on 10/03/2022 3:45:53 PM  Radiology DG Chest Port 1 View  Result Date: 10/03/2022 CLINICAL DATA:  Sepsis. Weakness. Cough. EXAM: PORTABLE CHEST 1 VIEW COMPARISON:  Chest CT 08/29/2022 FINDINGS: Right chest port remains in place. Chronic right lung volume loss with right upper lobe pleuroparenchymal opacity. The cavitary lesion on CT is not well-defined by radiograph. There is improving right infrahilar opacity from prior CT. The previous left apical opacity is not definitively seen by radiograph. Prior median sternotomy with prosthetic aortic valve. The heart is normal in size. No significant pleural effusion or pneumothorax. IMPRESSION: 1. Chronic right lung volume loss with right upper lobe pleuroparenchymal opacity. The cavitary lesion on CT is not well-defined by radiograph. 2. Improving right infrahilar opacity from prior CT. 3. Previous left apical opacity is not definitively seen by radiograph. Electronically Signed   By: Narda Rutherford M.D.   On: 10/03/2022 16:28    Procedures .Critical Care  Performed by: Silva Bandy, PA-C Authorized by: Silva Bandy, PA-C   Critical care provider statement:    Critical care time (minutes):  30   Critical care start time:  10/03/2022 5:30 PM   Critical care end time:  10/03/2022 6:00 PM   Critical care was necessary to treat or prevent imminent or life-threatening deterioration of the following conditions:  Sepsis   Critical care was time spent personally by me on the following activities:  Development of treatment plan with patient or surrogate, discussions with primary provider, evaluation of patient's response to treatment, examination of patient, obtaining history from patient or surrogate, ordering and review of laboratory studies, ordering and review of radiographic studies, pulse oximetry, re-evaluation of  patient's condition and review of old charts   Care discussed with: admitting provider       Medications Ordered in ED Medications  lactated  ringers bolus 1,000 mL (0 mLs Intravenous Stopped 10/03/22 1716)  vancomycin (VANCOCIN) IVPB 1000 mg/200 mL premix (0 mg Intravenous Stopped 10/03/22 1824)  ceFEPIme (MAXIPIME) 2 g in sodium chloride 0.9 % 100 mL IVPB (0 g Intravenous Stopped 10/03/22 1655)    ED Course/ Medical Decision Making/ A&P                             Medical Decision Making Amount and/or Complexity of Data Reviewed Labs: ordered. Radiology: ordered.  Risk Prescription drug management.   This patient is a 82 y.o. male who presents to the ED for concern of sepsis, hyperglycemia this involves an extensive number of treatment options, and is a complaint that carries with it a high risk of complications and morbidity. The emergent differential diagnosis prior to evaluation includes, but is not limited to,  DKA/HHS, sepsis, UTI, pneumonia, URI . This is not an exhaustive differential.   Past Medical History / Co-morbidities / Social History: history of squamous cell carcinoma of the right lung with last chemotherapy 1 month ago with port in right chest, aortic stenosis s/p AVR, HTN, DM2, HLD, chronic hepatitis C, hepatocellular carcinoma s/p ablation 2020, DVT, iron deficiency, COPD  Additional history: Chart reviewed. Pertinent results include: patient on 2L O2 at baseline  Physical Exam: Physical exam performed. The pertinent findings include: generally unwell appearing cachetic patient on baseline oxygen. Abdomen soft and nontender  Lab Tests: I ordered, and personally interpreted labs.  The pertinent results include:  WBC 11, hgb 10.1 improved from previous. Glucose 599 --> 493. Creatinine 1.22 up from 0.73 1 month ago. Lactic 2.5, UA noninfectious   Imaging Studies: I ordered imaging studies including CXR. I independently visualized and interpreted imaging which  showed   1. Chronic right lung volume loss with right upper lobe pleuroparenchymal opacity. The cavitary lesion on CT is not well-defined by radiograph. 2. Improving right infrahilar opacity from prior CT. 3. Previous left apical opacity is not definitively seen by radiograph.  I agree with the radiologist interpretation.   Cardiac Monitoring:  The patient was maintained on a cardiac monitor.  My attending physician Dr. Lockie Mola viewed and interpreted the cardiac monitored which showed an underlying rhythm of: sinus tachycardia. I agree with this interpretation.   Medications: I ordered medication including tylenol, fluids, vancomycin, and cefepime  for fever, sepsis. Reevaluation of the patient after these medicines showed that the patient stayed the same. I have reviewed the patients home medicines and have made adjustments as needed.   Disposition: After consideration of the diagnostic results and the patients response to treatment, I feel that patient will need admission for sepsis. Patient is understanding and in agreement.  Discussed patient with hospitalist who agrees to admit   I discussed this case with my attending physician Dr. Lockie Mola who cosigned this note including patient's presenting symptoms, physical exam, and planned diagnostics and interventions. Attending physician stated agreement with plan or made changes to plan which were implemented.     Final Clinical Impression(s) / ED Diagnoses Final diagnoses:  Sepsis, due to unspecified organism, unspecified whether acute organ dysfunction present [facility])  Type 2 diabetes mellitus with hyperglycemia, without long-term current use of insulin [facility])    Rx / DC Orders ED Discharge Orders     None         Vear Clock 10/03/22 1847    Virgina Norfolk, DO 10/03/22 1903

## 2022-10-03 NOTE — Assessment & Plan Note (Signed)
No significant wheezing on admission.  Continue Advair and albuterol as needed.

## 2022-10-03 NOTE — Progress Notes (Signed)
A consult was received from an ED physician for vancomycin and cefepime per pharmacy dosing.  The patient's profile has been reviewed for ht/wt/allergies/indication/available labs.    A one time order has been placed for cefepime 2 g + vancomycin 1000 mg IV daily.    Further antibiotics/pharmacy consults should be ordered by admitting physician if indicated.                       Thank you, Cindi Carbon, PharmD 10/03/2022  4:03 PM

## 2022-10-03 NOTE — Assessment & Plan Note (Signed)
Recent swallow study with evidence for small volume aspiration as well as concern for esophageal stricture.  Patient does report occasional choking episodes when eating. -SLP eval

## 2022-10-03 NOTE — Assessment & Plan Note (Signed)
Hyperglycemic in setting of prednisone use.  No evidence of DKA/HHS. -5 units NovoLog now -Placed on SSI, hold metformin -Continue IV fluids overnight

## 2022-10-03 NOTE — ED Notes (Signed)
ED TO INPATIENT HANDOFF REPORT  ED Nurse Name and Phone #: Shanda Bumps RN  S Name/Age/Gender [name] 82 y.o. male Room/Bed: WA12/WA12  Code Status   Code Status: Prior  Home/SNF/Other Home Patient oriented to: self, place, time, and situation Is this baseline? Yes   Triage Complete: Triage complete  Chief Complaint SIRS (systemic inflammatory response syndrome) (HCC) [R65.10]  Triage Note Per EMS  Weakness X4 days Cough Productive x4day Decreased appetite X4 days AMS (Per EMS)  EMS  Temp 103.2 CBG (high) HR 120 BP 130/80 O2 95% 2L (baseline)  EMS Meds NS Tylenol 650mg    Med Hx Lung CA on chemo    Allergies Allergies  Allergen Reactions   Azithromycin Swelling    Angioedema 05/2022    Lisinopril     Angioedema 05/2022   Ezetimibe-Simvastatin     Leg pains Restlessness at night     Metoprolol     bradycardia   Rosuvastatin     Discomfort, RESTLESS, CAN'T SLEEP     Level of Care/Admitting Diagnosis ED Disposition     ED Disposition  Admit   Condition  --   Comment  Hospital Area: [facility] [[id number]]  Level of Care: Telemetry [5]  Admit to tele based on following criteria: Monitor for Ischemic changes  May place patient in observation at [facility] or [facility] if equivalent level of care is available:: No  Covid Evaluation: Confirmed COVID Negative  Diagnosis: SIRS (systemic inflammatory response syndrome) [facility]) [[id number]]  Admitting Physician: Charlsie Quest [[id number]]  Attending Physician: Charlsie Quest [[id number]]          B Medical/Surgery History Past Medical History:  Diagnosis Date   Acute epididymo-orchitis    Aortic stenosis    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   Arthritis    Diabetes mellitus without complication [facility])    ED (erectile dysfunction)    Essential hypertension, benign    Heart murmur    Hep C w/o coma, chronic (HCC)    History of blood transfusion    History  of DVT of lower extremity    History of radiation therapy    Right Lung- 06/22/22-08/08/22- Dr. Antony Blackbird   Hx of echocardiogram    Echo (8/15):  Severe LVH, EF 60-65%, no RWMA, Gr 3 DD, AVR ok (mean 21 mmHg), MAC, mild LAE, mild to mod RAE   Hypercholesterolemia    PONV (postoperative nausea and vomiting)    Past Surgical History:  Procedure Laterality Date   AORTIC VALVE REPLACEMENT N/A 11/19/2013   Procedure: AORTIC VALVE REPLACEMENT (AVR);  Surgeon: Alleen Borne, MD;  Location: [facility] OR;  Service: Open Heart Surgery;  Laterality: N/A;   BUNIONECTOMY Bilateral 05/09/2006   CARDIAC CATHETERIZATION  10/01/2013   COLONOSCOPY  01/07/2010   internal hemorrhoids.  Dr Arlyce Dice   ESOPHAGOGASTRODUODENOSCOPY N/A 04/24/2013   Procedure: ESOPHAGOGASTRODUODENOSCOPY (EGD);  Surgeon: Beverley Fiedler, MD;  Location: [facility] ENDOSCOPY;  Service: Endoscopy;  Laterality: N/A;   INGUINAL HERNIA REPAIR Right    INTRAOPERATIVE TRANSESOPHAGEAL ECHOCARDIOGRAM N/A 11/19/2013   Procedure: INTRAOPERATIVE TRANSESOPHAGEAL ECHOCARDIOGRAM;  Surgeon: Alleen Borne, MD;  Location: [facility] OR;  Service: Open Heart Surgery;  Laterality: N/A;   IR IMAGING GUIDED PORT INSERTION  06/30/2022   KNEE ARTHROSCOPY Left 05/09/2009   Dr Leslee Home   LAMINECTOMY  06/09/2005   Dr Jeral Fruit. multiple lumbar level laminectomies.    LEFT AND RIGHT HEART CATHETERIZATION WITH CORONARY ANGIOGRAM N/A 10/01/2013   Procedure:  LEFT AND RIGHT HEART CATHETERIZATION WITH CORONARY ANGIOGRAM;  Surgeon: Donato Schultz, MD;  Location: [facility] CATH LAB;  Service: Cardiovascular;  Laterality: N/A;   SPINAL FUSION     lumbar   TEE WITHOUT CARDIOVERSION  11/07/2003   Aoritic valve sclerosis.    TONSILLECTOMY     VIDEO BRONCHOSCOPY WITH ENDOBRONCHIAL ULTRASOUND N/A 06/02/2022   Procedure: VIDEO BRONCHOSCOPY WITH ENDOBRONCHIAL ULTRASOUND;  Surgeon: Luciano Cutter, MD;  Location[facility] OR;  Service: Thoracic;  Laterality: N/A;     A IV Location/Drains/Wounds Patient  Lines/Drains/Airways Status     Active Line/Drains/Airways     Name Placement date Placement time Site Days   Implanted Port 06/30/22 Right Chest 06/30/22  1545  Chest  95   Peripheral IV 10/03/22 20 G Left Antecubital 10/03/22  1545  Antecubital  less than 1   Peripheral IV 10/03/22 20 G 1" Right Antecubital 10/03/22  1546  Antecubital  less than 1            Intake/Output Last 24 hours  Intake/Output Summary (Last 24 hours) at 10/03/2022 1914 Last data filed at 10/03/2022 1824 Gross per 24 hour  Intake 1247.55 ml  Output --  Net 1247.55 ml    Labs/Imaging Results for orders placed or performed during the hospital encounter of 10/03/22 (from the past 48 hour(s))  POC CBG, ED     Status: Abnormal   Collection Time: 10/03/22  3:35 PM  Result Value Ref Range   Glucose-Capillary 565 (HH) 70 - 99 mg/dL    Comment: Glucose reference range applies only to samples taken after fasting for at least 8 hours.  Lactic acid, plasma     Status: Abnormal   Collection Time: 10/03/22  3:40 PM  Result Value Ref Range   Lactic Acid, Venous 2.5 (HH) 0.5 - 1.9 mmol/L    Comment: CRITICAL RESULT CALLED TO, READ BACK BY AND VERIFIED WITH RN P DOWD AT 1611 10/03/22 CRUICKSHANK A Performed at [facility], [address], [place], [place] [address]   Beta-hydroxybutyric acid     Status: Abnormal   Collection Time: 10/03/22  3:41 PM  Result Value Ref Range   Beta-Hydroxybutyric Acid 1.17 (H) 0.05 - 0.27 mmol/L    Comment: Performed at [facility], [address] [place], [place] [address]  Comprehensive metabolic panel     Status: Abnormal   Collection Time: 10/03/22  3:43 PM  Result Value Ref Range   Sodium 145 135 - 145 mmol/L   Potassium 3.9 3.5 - 5.1 mmol/L   Chloride 108 98 - 111 mmol/L   CO2 25 22 - 32 mmol/L   Glucose, Bld 599 (HH) 70 - 99 mg/dL    Comment: CRITICAL RESULT CALLED TO, READ BACK BY AND VERIFIED WITH RN P DOWD AT 1611 10/03/22  CRUICKSHANK A Glucose reference range applies only to samples taken after fasting for at least 8 hours.    BUN 35 (H) 8 - 23 mg/dL   Creatinine, Ser 8.29 0.61 - 1.24 mg/dL   Calcium 9.3 8.9 - 56.2 mg/dL   Total Protein 7.6 6.5 - 8.1 g/dL   Albumin 2.4 (L) 3.5 - 5.0 g/dL   AST 16 15 - 41 U/L   ALT 14 0 - 44 U/L   Alkaline Phosphatase 70 38 - 126 U/L   Total Bilirubin 0.7 0.3 - 1.2 mg/dL   GFR, Estimated >13 >08 mL/min    Comment: (NOTE) Calculated using the CKD-EPI Creatinine Equation (2021)  Anion gap 12 5 - 15    Comment: Performed at [facility], [address] [place], [place] [address]  CBC with Differential     Status: Abnormal   Collection Time: 10/03/22  3:43 PM  Result Value Ref Range   WBC 11.0 (H) 4.0 - 10.5 K/uL   RBC 3.68 (L) 4.22 - 5.81 MIL/uL   Hemoglobin 10.1 (L) 13.0 - 17.0 g/dL   HCT 40.9 (L) 81.1 - 91.4 %   MCV 89.7 80.0 - 100.0 fL   MCH 27.4 26.0 - 34.0 pg   MCHC 30.6 30.0 - 36.0 g/dL   RDW 78.2 (H) 95.6 - 21.3 %   Platelets 265 150 - 400 K/uL   nRBC 0.0 0.0 - 0.2 %   Neutrophils Relative % 91 %   Neutro Abs 10.0 (H) 1.7 - 7.7 K/uL   Lymphocytes Relative 2 %   Lymphs Abs 0.2 (L) 0.7 - 4.0 K/uL   Monocytes Relative 6 %   Monocytes Absolute 0.7 0.1 - 1.0 K/uL   Eosinophils Relative 0 %   Eosinophils Absolute 0.0 0.0 - 0.5 K/uL   Basophils Relative 0 %   Basophils Absolute 0.0 0.0 - 0.1 K/uL   Immature Granulocytes 1 %   Abs Immature Granulocytes 0.07 0.00 - 0.07 K/uL    Comment: Performed at [facility], [address] [place], [place] [address]  Protime-INR     Status: None   Collection Time: 10/03/22  3:43 PM  Result Value Ref Range   Prothrombin Time 14.8 11.4 - 15.2 seconds   INR 1.1 0.8 - 1.2    Comment: (NOTE) INR goal varies based on device and disease states. Performed at [facility], [address] [place], [place] [address]   Urinalysis, w/ Reflex to Culture (Infection  Suspected) -Urine, Clean Catch     Status: Abnormal   Collection Time: 10/03/22  3:48 PM  Result Value Ref Range   Specimen Source URINE, CATHETERIZED    Color, Urine YELLOW YELLOW   APPearance CLEAR CLEAR   Specific Gravity, Urine 1.028 1.005 - 1.030   pH 5.0 5.0 - 8.0   Glucose, UA >=500 (A) NEGATIVE mg/dL   Hgb urine dipstick SMALL (A) NEGATIVE   Bilirubin Urine NEGATIVE NEGATIVE   Ketones, ur NEGATIVE NEGATIVE mg/dL   Protein, ur 30 (A) NEGATIVE mg/dL   Nitrite NEGATIVE NEGATIVE   Leukocytes,Ua SMALL (A) NEGATIVE   RBC / HPF 11-20 0 - 5 RBC/hpf   WBC, UA 11-20 0 - 5 WBC/hpf    Comment:        Reflex urine culture not performed if WBC <=10, OR if Squamous epithelial cells >5. If Squamous epithelial cells >5 suggest recollection.    Bacteria, UA NONE SEEN NONE SEEN   Squamous Epithelial / HPF 0-5 0 - 5 /HPF   Mucus PRESENT    Hyaline Casts, UA PRESENT    Sperm, UA PRESENT     Comment: Performed at [facility], [address] [place], [place] [address]  Resp panel by RT-PCR (RSV, Flu A&B, Covid) Anterior Nasal Swab     Status: None   Collection Time: 10/03/22  3:57 PM   Specimen: Anterior Nasal Swab  Result Value Ref Range   SARS Coronavirus 2 by RT PCR NEGATIVE NEGATIVE    Comment: (NOTE) SARS-CoV-2 target nucleic acids are NOT DETECTED.  The SARS-CoV-2 RNA is generally detectable in upper respiratory specimens during the acute phase  of infection. The lowest concentration of SARS-CoV-2 viral copies this assay can detect is 138 copies/mL. A negative result does not preclude SARS-Cov-2 infection and should not be used as the sole basis for treatment or other patient management decisions. A negative result may occur with  improper specimen collection/handling, submission of specimen other than nasopharyngeal swab, presence of viral mutation(s) within the areas targeted by this assay, and inadequate number of viral copies(<138 copies/mL). A negative  result must be combined with clinical observations, patient history, and epidemiological information. The expected result is Negative.  Fact Sheet for Patients:  BloggerCourse.com  Fact Sheet for Healthcare Providers:  SeriousBroker.it  This test is no t yet approved or cleared by the [place] FDA and  has been authorized for detection and/or diagnosis of SARS-CoV-2 by FDA under an Emergency Use Authorization (EUA). This EUA will remain  in effect (meaning this test can be used) for the duration of the COVID-19 declaration under Section 564(b)(1) of the Act, 21 U.S.C.section 360bbb-3(b)(1), unless the authorization is terminated  or revoked sooner.       Influenza A by PCR NEGATIVE NEGATIVE   Influenza B by PCR NEGATIVE NEGATIVE    Comment: (NOTE) The Xpert Xpress SARS-CoV-2/FLU/RSV plus assay is intended as an aid in the diagnosis of influenza from Nasopharyngeal swab specimens and should not be used as a sole basis for treatment. Nasal washings and aspirates are unacceptable for Xpert Xpress SARS-CoV-2/FLU/RSV testing.  Fact Sheet for Patients: BloggerCourse.com  Fact Sheet for Healthcare Providers: SeriousBroker.it  This test is not yet approved or cleared by the [place] FDA and has been authorized for detection and/or diagnosis of SARS-CoV-2 by FDA under an Emergency Use Authorization (EUA). This EUA will remain in effect (meaning this test can be used) for the duration of the COVID-19 declaration under Section 564(b)(1) of the Act, 21 U.S.C. section 360bbb-3(b)(1), unless the authorization is terminated or revoked.     Resp Syncytial Virus by PCR NEGATIVE NEGATIVE    Comment: (NOTE) Fact Sheet for Patients: BloggerCourse.com  Fact Sheet for Healthcare Providers: SeriousBroker.it  This test is not yet  approved or cleared by the [place] FDA and has been authorized for detection and/or diagnosis of SARS-CoV-2 by FDA under an Emergency Use Authorization (EUA). This EUA will remain in effect (meaning this test can be used) for the duration of the COVID-19 declaration under Section 564(b)(1) of the Act, 21 U.S.C. section 360bbb-3(b)(1), unless the authorization is terminated or revoked.  Performed at [facility], [address], [place], [place] [address]   APTT     Status: None   Collection Time: 10/03/22  4:04 PM  Result Value Ref Range   aPTT 29 24 - 36 seconds    Comment: Performed at [facility], [address] [place], [place] [address]  Brain natriuretic peptide     Status: Abnormal   Collection Time: 10/03/22  4:04 PM  Result Value Ref Range   B Natriuretic Peptide 152.0 (H) 0.0 - 100.0 pg/mL    Comment: Performed at [facility], [address] [place], [place] [address]  POC CBG, ED     Status: Abnormal   Collection Time: 10/03/22  6:32 PM  Result Value Ref Range   Glucose-Capillary 493 (H) 70 - 99 mg/dL    Comment: Glucose reference range applies only to samples taken after fasting for at least 8 hours.  Lactic acid, plasma     Status: None  Collection Time: 10/03/22  6:39 PM  Result Value Ref Range   Lactic Acid, Venous 1.9 0.5 - 1.9 mmol/L    Comment: Performed at [facility] [place] [facility], [address], [place], [place] [address]   DG Chest Port 1 View  Result Date: 10/03/2022 CLINICAL DATA:  Sepsis. Weakness. Cough. EXAM: PORTABLE CHEST 1 VIEW COMPARISON:  Chest CT 08/29/2022 FINDINGS: Right chest port remains in place. Chronic right lung volume loss with right upper lobe pleuroparenchymal opacity. The cavitary lesion on CT is not well-defined by radiograph. There is improving right infrahilar opacity from prior CT. The previous left apical opacity is not definitively seen by radiograph. Prior  median sternotomy with prosthetic aortic valve. The heart is normal in size. No significant pleural effusion or pneumothorax. IMPRESSION: 1. Chronic right lung volume loss with right upper lobe pleuroparenchymal opacity. The cavitary lesion on CT is not well-defined by radiograph. 2. Improving right infrahilar opacity from prior CT. 3. Previous left apical opacity is not definitively seen by radiograph. Electronically Signed   By: Narda Rutherford M.D.   On: 10/03/2022 16:28    Pending Labs Unresulted Labs (From admission, onward)     Start     Ordered   10/03/22 1548  Urine Culture  Once,   R        10/03/22 1548   10/03/22 1535  Culture, blood (Routine x 2)  BLOOD CULTURE X 2,   R (with STAT occurrences)      10/03/22 1534            Vitals/Pain Today's Vitals   10/03/22 1645 10/03/22 1700 10/03/22 1834 10/03/22 1900  BP: 135/80 (!) 143/82  (!) 141/90  Pulse: 93 85  87  Resp: 15 10  14   Temp:      TempSrc:      SpO2: 100% 98%  99%  Weight:      Height:      PainSc:   0-No pain     Isolation Precautions No active isolations  Medications Medications  lactated ringers bolus 1,000 mL (0 mLs Intravenous Stopped 10/03/22 1716)  vancomycin (VANCOCIN) IVPB 1000 mg/200 mL premix (0 mg Intravenous Stopped 10/03/22 1824)  ceFEPIme (MAXIPIME) 2 g in sodium chloride 0.9 % 100 mL IVPB (0 g Intravenous Stopped 10/03/22 1655)    Mobility walks with device     Focused Assessments    R Recommendations: See Admitting Provider Note  Report given to:   Additional Notes:

## 2022-10-03 NOTE — Hospital Course (Signed)
[name] is a 82 y.o. 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

## 2022-10-03 NOTE — ED Triage Notes (Signed)
Per EMS  Weakness X4 days Cough Productive x4day Decreased appetite X4 days AMS (Per EMS)  EMS  Temp 103.2 CBG (high) HR 120 BP 130/80 O2 95% 2L (baseline)  EMS Meds NS Tylenol 650mg    Med Hx Lung CA on chemo

## 2022-10-03 NOTE — Assessment & Plan Note (Signed)
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  Recently placed on continuous 2 L O2 via [place] as an outpatient by pulmonology.  SpO2 stable on 2 L via [place].

## 2022-10-03 NOTE — ED Provider Notes (Signed)
Shared PA visit.  Patient here with fever started today.  Cough.  History of COPD and lung cancer on 2 L of oxygen.  Fever started today.  No abdominal pain nausea vomiting diarrhea.  Scott a little bit of swelling to the top of his left foot but this appears nonspecific.  He has no tenderness.  No redness or erythema.  Normal range of motion of the left ankle.  I do not think there is any source of infection here.  My suspicion is that this is likely viral/pneumonia.  Sepsis workup initiated and IV antibiotics started.  He is no longer on any chemotherapy but he does have a port which could always be a source for infection.  Anticipate admission.  Blood sugar upon arrival is 565 and will need evaluation for DKA as well.  Please see PA note for further results, evaluation, disposition of patient but anticipate admission for further hyperglycemia control and sepsis rule out.  This chart was dictated using voice recognition software.  Despite best efforts to proofread,  errors can occur which can change the documentation meaning.    Virgina Norfolk, DO 10/03/22 1629

## 2022-10-04 ENCOUNTER — Observation Stay (HOSPITAL_COMMUNITY): Payer: Medicare Other

## 2022-10-04 ENCOUNTER — Ambulatory Visit (HOSPITAL_COMMUNITY)
Admission: RE | Admit: 2022-10-04 | Discharge: 2022-10-04 | Disposition: A | Discharge status: Home / Self Care | Payer: Medicare Other | Source: Ambulatory Visit | Attending: Internal Medicine | Admitting: Internal Medicine

## 2022-10-04 ENCOUNTER — Inpatient Hospital Stay: Payer: Medicare Other | Attending: Internal Medicine

## 2022-10-04 DIAGNOSIS — J9621 Acute and chronic respiratory failure with hypoxia: Secondary | ICD-10-CM | POA: Diagnosis not present

## 2022-10-04 DIAGNOSIS — J440 Chronic obstructive pulmonary disease with acute lower respiratory infection: Secondary | ICD-10-CM | POA: Diagnosis present

## 2022-10-04 DIAGNOSIS — R0902 Hypoxemia: Secondary | ICD-10-CM | POA: Diagnosis not present

## 2022-10-04 DIAGNOSIS — R1310 Dysphagia, unspecified: Secondary | ICD-10-CM | POA: Diagnosis not present

## 2022-10-04 DIAGNOSIS — J9611 Chronic respiratory failure with hypoxia: Secondary | ICD-10-CM | POA: Diagnosis present

## 2022-10-04 DIAGNOSIS — J700 Acute pulmonary manifestations due to radiation: Secondary | ICD-10-CM | POA: Diagnosis not present

## 2022-10-04 DIAGNOSIS — I152 Hypertension secondary to endocrine disorders: Secondary | ICD-10-CM | POA: Diagnosis present

## 2022-10-04 DIAGNOSIS — A419 Sepsis, unspecified organism: Secondary | ICD-10-CM | POA: Diagnosis not present

## 2022-10-04 DIAGNOSIS — R0603 Acute respiratory distress: Secondary | ICD-10-CM | POA: Diagnosis not present

## 2022-10-04 DIAGNOSIS — C3490 Malignant neoplasm of unspecified part of unspecified bronchus or lung: Secondary | ICD-10-CM

## 2022-10-04 DIAGNOSIS — K7460 Unspecified cirrhosis of liver: Secondary | ICD-10-CM | POA: Diagnosis present

## 2022-10-04 DIAGNOSIS — J189 Pneumonia, unspecified organism: Secondary | ICD-10-CM | POA: Diagnosis present

## 2022-10-04 DIAGNOSIS — Z681 Body mass index (BMI) 19 or less, adult: Secondary | ICD-10-CM | POA: Diagnosis not present

## 2022-10-04 DIAGNOSIS — R609 Edema, unspecified: Secondary | ICD-10-CM | POA: Diagnosis not present

## 2022-10-04 DIAGNOSIS — E876 Hypokalemia: Secondary | ICD-10-CM | POA: Diagnosis present

## 2022-10-04 DIAGNOSIS — J690 Pneumonitis due to inhalation of food and vomit: Secondary | ICD-10-CM | POA: Diagnosis present

## 2022-10-04 DIAGNOSIS — Z953 Presence of xenogenic heart valve: Secondary | ICD-10-CM | POA: Diagnosis not present

## 2022-10-04 DIAGNOSIS — C3491 Malignant neoplasm of unspecified part of right bronchus or lung: Secondary | ICD-10-CM | POA: Diagnosis present

## 2022-10-04 DIAGNOSIS — E872 Acidosis, unspecified: Secondary | ICD-10-CM | POA: Diagnosis present

## 2022-10-04 DIAGNOSIS — J441 Chronic obstructive pulmonary disease with (acute) exacerbation: Secondary | ICD-10-CM | POA: Diagnosis present

## 2022-10-04 DIAGNOSIS — J449 Chronic obstructive pulmonary disease, unspecified: Secondary | ICD-10-CM | POA: Diagnosis not present

## 2022-10-04 DIAGNOSIS — Z79899 Other long term (current) drug therapy: Secondary | ICD-10-CM | POA: Diagnosis not present

## 2022-10-04 DIAGNOSIS — Z515 Encounter for palliative care: Secondary | ICD-10-CM | POA: Diagnosis not present

## 2022-10-04 DIAGNOSIS — R64 Cachexia: Secondary | ICD-10-CM | POA: Diagnosis present

## 2022-10-04 DIAGNOSIS — Z794 Long term (current) use of insulin: Secondary | ICD-10-CM

## 2022-10-04 DIAGNOSIS — E1165 Type 2 diabetes mellitus with hyperglycemia: Secondary | ICD-10-CM | POA: Diagnosis present

## 2022-10-04 DIAGNOSIS — D500 Iron deficiency anemia secondary to blood loss (chronic): Secondary | ICD-10-CM

## 2022-10-04 DIAGNOSIS — Z7189 Other specified counseling: Secondary | ICD-10-CM | POA: Diagnosis not present

## 2022-10-04 DIAGNOSIS — Z7984 Long term (current) use of oral hypoglycemic drugs: Secondary | ICD-10-CM | POA: Diagnosis not present

## 2022-10-04 DIAGNOSIS — R627 Adult failure to thrive: Secondary | ICD-10-CM | POA: Diagnosis present

## 2022-10-04 DIAGNOSIS — E870 Hyperosmolality and hypernatremia: Secondary | ICD-10-CM | POA: Diagnosis present

## 2022-10-04 DIAGNOSIS — E119 Type 2 diabetes mellitus without complications: Secondary | ICD-10-CM

## 2022-10-04 DIAGNOSIS — Y842 Radiological procedure and radiotherapy as the cause of abnormal reaction of the patient, or of later complication, without mention of misadventure at the time of the procedure: Secondary | ICD-10-CM | POA: Diagnosis present

## 2022-10-04 DIAGNOSIS — E7800 Pure hypercholesterolemia, unspecified: Secondary | ICD-10-CM | POA: Diagnosis present

## 2022-10-04 DIAGNOSIS — Z743 Need for continuous supervision: Secondary | ICD-10-CM | POA: Diagnosis not present

## 2022-10-04 DIAGNOSIS — J181 Lobar pneumonia, unspecified organism: Secondary | ICD-10-CM | POA: Diagnosis not present

## 2022-10-04 DIAGNOSIS — E1169 Type 2 diabetes mellitus with other specified complication: Secondary | ICD-10-CM | POA: Diagnosis present

## 2022-10-04 DIAGNOSIS — Z66 Do not resuscitate: Secondary | ICD-10-CM | POA: Diagnosis present

## 2022-10-04 DIAGNOSIS — R6510 Systemic inflammatory response syndrome (SIRS) of non-infectious origin without acute organ dysfunction: Secondary | ICD-10-CM | POA: Diagnosis not present

## 2022-10-04 DIAGNOSIS — J432 Centrilobular emphysema: Secondary | ICD-10-CM | POA: Diagnosis not present

## 2022-10-04 DIAGNOSIS — D638 Anemia in other chronic diseases classified elsewhere: Secondary | ICD-10-CM | POA: Diagnosis present

## 2022-10-04 DIAGNOSIS — Z1152 Encounter for screening for COVID-19: Secondary | ICD-10-CM | POA: Diagnosis not present

## 2022-10-04 DIAGNOSIS — E43 Unspecified severe protein-calorie malnutrition: Secondary | ICD-10-CM | POA: Diagnosis present

## 2022-10-04 DIAGNOSIS — J9601 Acute respiratory failure with hypoxia: Secondary | ICD-10-CM | POA: Diagnosis not present

## 2022-10-04 LAB — RESPIRATORY PANEL BY PCR

## 2022-10-04 LAB — GLUCOSE, CAPILLARY
Glucose-Capillary: 78 mg/dL (ref 70–99)
Glucose-Capillary: 175 mg/dL — ABNORMAL HIGH (ref 70–99)
Glucose-Capillary: 261 mg/dL — ABNORMAL HIGH (ref 70–99)

## 2022-10-04 LAB — BASIC METABOLIC PANEL
Anion gap: 6 (ref 5–15)
BUN: 23 mg/dL (ref 8–23)
CO2: 29 mmol/L (ref 22–32)
Calcium: 9.1 mg/dL (ref 8.9–10.3)
Chloride: 111 mmol/L (ref 98–111)
Creatinine, Ser: 0.79 mg/dL (ref 0.61–1.24)
GFR, Estimated: >60 mL/min (ref >60)
Glucose, Bld: 74 mg/dL (ref 70–99)
Potassium: 2.9 mmol/L — ABNORMAL LOW (ref 3.5–5.1)
Sodium: 146 mmol/L — ABNORMAL HIGH (ref 135–145)

## 2022-10-04 LAB — PROCALCITONIN: Procalcitonin: 0.46 ng/mL

## 2022-10-04 LAB — CBC
HCT: 32.6 % — ABNORMAL LOW (ref 39.0–52.0)
Hemoglobin: 10.2 g/dL — ABNORMAL LOW (ref 13.0–17.0)
MCH: 27.7 pg (ref 26.0–34.0)
MCHC: 31.3 g/dL (ref 30.0–36.0)
MCV: 88.6 fL (ref 80.0–100.0)
Platelets: 252 10*3/uL (ref 150–400)
RBC: 3.68 MIL/uL — ABNORMAL LOW (ref 4.22–5.81)
RDW: 21.5 % — ABNORMAL HIGH (ref 11.5–15.5)
WBC: 10.8 10*3/uL — ABNORMAL HIGH (ref 4.0–10.5)
nRBC: 0 % (ref 0.0–0.2)

## 2022-10-04 LAB — MAGNESIUM: Magnesium: 1.8 mg/dL (ref 1.7–2.4)

## 2022-10-04 LAB — STREP PNEUMONIAE URINARY ANTIGEN: Strep Pneumo Urinary Antigen: NEGATIVE

## 2022-10-04 LAB — URINE CULTURE

## 2022-10-04 LAB — CULTURE, BLOOD (ROUTINE X 2)

## 2022-10-04 LAB — MRSA NEXT GEN BY PCR, NASAL: MRSA by PCR Next Gen: NOT DETECTED

## 2022-10-04 MED ORDER — IOHEXOL 300 MG/ML  SOLN
75.0000 mL | Freq: Once | INTRAMUSCULAR | Status: AC | PRN
  Administered 2022-10-04: 75 mL via INTRAVENOUS

## 2022-10-04 MED ORDER — AMLODIPINE BESYLATE 10 MG PO TABS
10.0000 mg | ORAL_TABLET | Freq: Every day | ORAL | Status: DC
  Administered 2022-10-04 – 2022-10-07 (×3): 10 mg via ORAL
  Filled 2022-10-04 (×4): qty 1

## 2022-10-04 MED ORDER — PANTOPRAZOLE SODIUM 40 MG IV SOLR
40.0000 mg | Freq: Two times a day (BID) | INTRAVENOUS | Status: DC
  Administered 2022-10-04: 40 mg via INTRAVENOUS
  Filled 2022-10-04 (×2): qty 10

## 2022-10-04 MED ORDER — SODIUM CHLORIDE (PF) 0.9 % IJ SOLN
INTRAMUSCULAR | Status: AC
  Filled 2022-10-04: qty 50

## 2022-10-04 MED ORDER — POTASSIUM CHLORIDE 10 MEQ/100ML IV SOLN
10.0000 meq | INTRAVENOUS | Status: AC
  Administered 2022-10-04 (×6): 10 meq via INTRAVENOUS
  Filled 2022-10-04 (×6): qty 100

## 2022-10-04 MED ORDER — PANTOPRAZOLE SODIUM 40 MG IV SOLR
40.0000 mg | Freq: Two times a day (BID) | INTRAVENOUS | Status: DC
  Administered 2022-10-05 – 2022-10-07 (×5): 40 mg via INTRAVENOUS
  Filled 2022-10-04 (×5): qty 10

## 2022-10-04 MED ORDER — ENSURE ENLIVE PO LIQD
237.0000 mL | Freq: Three times a day (TID) | ORAL | Status: DC
  Administered 2022-10-04 – 2022-10-05 (×2): 237 mL via ORAL

## 2022-10-04 MED ORDER — LACTATED RINGERS IV SOLN: INTRAVENOUS | Status: DC

## 2022-10-04 MED ORDER — CHLORHEXIDINE GLUCONATE CLOTH 2 % EX PADS
6.0000 | MEDICATED_PAD | Freq: Every day | CUTANEOUS | Status: DC
  Administered 2022-10-04 – 2022-10-08 (×5): 6 via TOPICAL

## 2022-10-04 MED ORDER — ATORVASTATIN CALCIUM 40 MG PO TABS
20.0000 mg | ORAL_TABLET | Freq: Every day | ORAL | Status: DC
  Administered 2022-10-05 – 2022-10-07 (×2): 20 mg via ORAL
  Filled 2022-10-04 (×3): qty 1

## 2022-10-04 NOTE — Progress Notes (Signed)
Initial Nutrition Assessment  DOCUMENTATION CODES:   Underweight  INTERVENTION:   -Ensure Plus High Protein po TID, each supplement provides 350 kcal and 20 grams of protein.   NUTRITION DIAGNOSIS:   Inadequate oral intake related to dysphagia as evidenced by per patient/family report.  GOAL:   Patient will meet greater than or equal to 90% of their needs  MONITOR:   PO intake, Supplement acceptance, Labs, Weight trends, I & O's  REASON FOR ASSESSMENT:   Consult Assessment of nutrition requirement/status  ASSESSMENT:   82 y.o. 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 Admitted for CAP and dysphagia.  Unable to see patient x 3 attempts given pt visited by many providers and disciplines this AM. Will attempt to see at later date. Per chart review, pt with dysphagia r/t esophageal stricture since radiation began in February 2024. Has been seen by GI, recommending EGD 5/30 for esophageal dilation. Currently on dysphagia 1 diet.  Will order Ensure supplements.  Per weight records, pt has lost 8 lbs since 5/2 (6% wt loss x ~1 month, significant for time frame).   Medications: Lactated ringers, KCl  Labs reviewed: CBGs: 78-478 Elevated Na Low K   NUTRITION - FOCUSED PHYSICAL EXAM:  Unable to complete at this time.  Diet Order:   Diet Order             DIET - DYS 1 Room service appropriate? Yes; Fluid consistency: Thin  Diet effective now                   EDUCATION NEEDS:   Not appropriate for education at this time  Skin:  Skin Assessment: Reviewed RN Assessment  Last BM:  5/26  Height:   Ht Readings from Last 1 Encounters:  10/03/22 5\' 11"  (1.803 m)    Weight:   Wt Readings from  Last 1 Encounters:  10/03/22 54.4 kg    BMI:  Body mass index is 16.74 kg/m.  Estimated Nutritional Needs:   Kcal:  2000-2200  Protein:  95-110g  Fluid:  2L/day  Tilda Franco, MS, RD, LDN Inpatient Clinical Dietitian Contact information available via Amion

## 2022-10-04 NOTE — Evaluation (Signed)
Clinical/Bedside Swallow Evaluation Patient Details  Name: [name] MRN: [id number] Date of Birth: [birth year]  Today's Date: 10/04/2022 Time: SLP Start Time (ACUTE ONLY): 1205 SLP Stop Time (ACUTE ONLY): 1230 SLP Time Calculation (min) (ACUTE ONLY): 25 min  Past Medical History:  Past Medical History:  Diagnosis Date   Acute epididymo-orchitis    Aortic stenosis    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   Arthritis    Diabetes mellitus without complication [facility])    ED (erectile dysfunction)    Essential hypertension, benign    Heart murmur    Hep C w/o coma, chronic (HCC)    History of blood transfusion    History of DVT of lower extremity    History of radiation therapy    Right Lung- 06/22/22-08/08/22- Dr. Antony Blackbird   Hx of echocardiogram    Echo (8/15):  Severe LVH, EF 60-65%, no RWMA, Gr 3 DD, AVR ok (mean 21 mmHg), MAC, mild LAE, mild to mod RAE   Hypercholesterolemia    PONV (postoperative nausea and vomiting)    Past Surgical History:  Past Surgical History:  Procedure Laterality Date   AORTIC VALVE REPLACEMENT N/A 11/19/2013   Procedure: AORTIC VALVE REPLACEMENT (AVR);  Surgeon: Alleen Borne, MD;  Location: [facility] OR;  Service: Open Heart Surgery;  Laterality: N/A;   BUNIONECTOMY Bilateral 05/09/2006   CARDIAC CATHETERIZATION  10/01/2013   COLONOSCOPY  01/07/2010   internal hemorrhoids.  Dr Arlyce Dice   ESOPHAGOGASTRODUODENOSCOPY N/A 04/24/2013   Procedure: ESOPHAGOGASTRODUODENOSCOPY (EGD);  Surgeon: Beverley Fiedler, MD;  Location: [facility] ENDOSCOPY;  Service: Endoscopy;  Laterality: N/A;   INGUINAL HERNIA REPAIR Right    INTRAOPERATIVE TRANSESOPHAGEAL ECHOCARDIOGRAM N/A 11/19/2013   Procedure: INTRAOPERATIVE TRANSESOPHAGEAL ECHOCARDIOGRAM;  Surgeon: Alleen Borne, MD;  Location: [facility] OR;  Service: Open Heart Surgery;  Laterality: N/A;   IR IMAGING GUIDED PORT INSERTION  06/30/2022   KNEE ARTHROSCOPY Left 05/09/2009   Dr Leslee Home   LAMINECTOMY  06/09/2005   Dr  Jeral Fruit. multiple lumbar level laminectomies.    LEFT AND RIGHT HEART CATHETERIZATION WITH CORONARY ANGIOGRAM N/A 10/01/2013   Procedure: LEFT AND RIGHT HEART CATHETERIZATION WITH CORONARY ANGIOGRAM;  Surgeon: Donato Schultz, MD;  Location: [facility] CATH LAB;  Service: Cardiovascular;  Laterality: N/A;   SPINAL FUSION     lumbar   TEE WITHOUT CARDIOVERSION  11/07/2003   Aoritic valve sclerosis.    TONSILLECTOMY     VIDEO BRONCHOSCOPY WITH ENDOBRONCHIAL ULTRASOUND N/A 06/02/2022   Procedure: VIDEO BRONCHOSCOPY WITH ENDOBRONCHIAL ULTRASOUND;  Surgeon: Luciano Cutter, MD;  Location: [facility] OR;  Service: Thoracic;  Laterality: N/A;   HPI:  Pt is a 82 yo male adm to [facility] with sepsis - concern for pneumonitis.  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  Pt had dysphagia and had underone OP MBS in April and with functional oropharyngeal swallow functional and concern for primary esophageal deficits.  Pt then underwent esophagram showing dysmotility and potential stricture. Pt was to see GI as OP but did not have follow up yet.  squamous cell carcinoma of the lung dx 05/2022.  He underwent concurrent chemoradiation and was finished first of April.  Concern for pt having pneumonitis is present. Swallow eval ordered.  Family present reports pt has lost significant amount of weight - and pt can't not describe reasoning for poor po.    Assessment / Plan / Recommendation  Clinical Impression  Pt greeted sitting upright in chair eating his lunch, wife and son at  bedside.  Frequent throat clearing and cough noted t/o session. Wife reports pt has chronic cough - noted by daughter in December prompting medical appointment with diagnosis of lung cancer.  Fortunately pt's cough is strong and he was able to expectorate secretions.  Mild right facial asymmetry - is baseline from review of picture on wife's phone.  Suspect pt's primary dysphagia is known dysmotility and concern for potential stricture. Note pt to see GI today.  Pt  conducts multiple swallows - up to 3 with each bolus of liquid and delayed swallow - suspect compensatory for known dysphagia.   Reviewed MBS and DG esophagram flouro loops with family - and reviewed general aspiration precautions.  Wife reports pt was overtly choking on water when she arrived to the room.  Advised for comfort to take small single sips - and consider drinking slightly thicker liquids with meals if improves comfort. Advised RN consider giving pt medicine with ENsure.   Reviewed compensation strategies for dysmotiltiy including eating small frequent meals, consuming warm liquids if helpful.  Of note, pt did isolate symptom of food lodging - pointing to proximal pharynx *vallecula* . SLP will follow up for dysphagia management. SLP Visit Diagnosis: Dysphagia, unspecified (R13.10)    Aspiration Risk  Mild aspiration risk    Diet Recommendation Thin liquid (defer to GI for solids) pt also self limiting his diet)   Medication Administration: Other (Comment) (with Ensure - pt crushed some meds at home) Supervision: Patient able to self feed;Staff to assist with self feeding Compensations: Slow rate;Small sips/bites Postural Changes: Seated upright at 90 degrees;Remain upright for at least 30 minutes after po intake    Other  Recommendations Oral Care Recommendations: Oral care QID    Recommendations for follow up therapy are one component of a multi-disciplinary discharge planning process, led by the attending physician.  Recommendations may be updated based on patient status, additional functional criteria and insurance authorization.  Follow up Recommendations    TBD     Assistance Recommended at Discharge  TBD  Functional Status Assessment Patient has had a recent decline in their functional status and demonstrates the ability to make significant improvements in function in a reasonable and predictable amount of time.  Frequency and Duration min 1 x/week  1 week        Prognosis Prognosis for improved oropharyngeal function: Fair Barriers to Reach Goals: Time post onset      Swallow Study   General Date of Onset: 10/04/22 HPI: Pt is a 82 yo male adm to [facility] with sepsis - concern for pneumonitis.  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  Pt had dysphagia and had underone OP MBS in April and with functional oropharyngeal swallow functional and concern for primary esophageal deficits.  Pt then underwent esophagram showing dysmotility and potential stricture. Pt was to see GI as OP but did not have follow up yet.  squamous cell carcinoma of the lung dx 05/2022.  He underwent concurrent chemoradiation and was finished first of April.  Concern for pt having pneumonitis is present. Swallow eval ordered.  Family present reports pt has lost significant amount of weight - and pt can't not describe reasoning for poor po. Type of Study: Bedside Swallow Evaluation Previous Swallow Assessment: see HPI Diet Prior to this Study: Dysphagia 1 (pureed);Thin liquids (Level 0) Temperature Spikes Noted: No Respiratory Status: Room air History of Recent Intubation: No Behavior/Cognition: Alert;Cooperative;Pleasant mood Oral Cavity Assessment: Within Functional Limits Oral Care Completed by SLP: No  Oral Cavity - Dentition: Adequate natural dentition Vision: Functional for self-feeding Self-Feeding Abilities: Able to feed self Patient Positioning: Upright in chair Baseline Vocal Quality: Other (comment);Low vocal intensity (family reports voice is weaker) Volitional Cough: Strong Volitional Swallow: Able to elicit    Oral/Motor/Sensory Function Overall Oral Motor/Sensory Function: Other (comment) (slight right facial asymmetry - appears baseline from review of pictures on wife's phone)   Ice Chips Ice chips: Not tested   Thin Liquid Thin Liquid: Impaired Presentation: Cup;Straw;Self Fed Oral Phase Functional Implications: Oral holding;Other (comment) (clinical  presentation of oral holding) Pharyngeal  Phase Impairments: Multiple swallows Other Comments: throat clearing, cough- piecemealing    Nectar Thick Nectar Thick Liquid: Not tested   Honey Thick Honey Thick Liquid: Not tested   Puree Puree: Within functional limits Presentation: Self Fed;Spoon   Solid     Solid: Not tested      Chales Abrahams 10/04/2022,1:05 PM  Rolena Infante, MS [facility] SLP Acute Rehab Services Office [phone]

## 2022-10-04 NOTE — Progress Notes (Signed)
Mobility Specialist - Progress Note   10/04/22 1500  Mobility  Activity Ambulated with assistance to bathroom  Level of Assistance Contact guard assist, steadying assist  Assistive Device Front wheel walker  Distance Ambulated (ft) 5 ft  Activity Response Tolerated well  Mobility Referral Yes  $Mobility charge 1 Mobility  Mobility Specialist Stop Time (ACUTE ONLY) 0258   Pt received in bed requesting assistance to the bathroom. No complaints during session. NT made aware. Instructed pt to pull call bell when finished. Pt to the bathroom after session with all needs met.   [facility]

## 2022-10-04 NOTE — Consult Note (Addendum)
Consultation  Referring Provider:  [facility]  Primary Care Physician:  Darrow Bussing, MD Primary Gastroenterologist:  Previous patient of Dr. Arlyce Dice       Reason for Consultation:     dysphagia  LOS: 0 days          HPI:   [name] is a 82 y.o. 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  Patient originally presented to ED for cough and generalized weakness.  ED findings of fever, tachycardia, lactic acidosis and admitted for SIRS with unclear infectious source.  Working diagnosis is pulmonary etiology or secondary to malignancy.  Patient is on broad spectrum antibiotics with vancomycin, cefepime, and Flagyl.  History of radiation pneumonitis  Found to have necrotic right upper lobe lung mass January 2024 and was subsequently diagnosed with squamous cell carcinoma of the lung.  Underwent concurrent chemoradiation.  Last dose 07/28/2022.  Patient and wife state that patient has struggled with dysphagia since first round of radiation in February.  Reports that dysphagia has gotten progressively worse.  Occurs with both solids and liquids.  Barium swallow 09/14/2022: small volume aspiration of contrast. Mild-moderate narrowed appearance of upper thoracic esophagus.  13 mm barium tablet did not pass on MBS 4/24 so tablet was not attempted during this study. Moderate esophageal dysmotility with tertiary contractions.  Patient denies melena, hematochezia. Denies abdominal pain, nausea, vomiting, reflux. Denies NSAIDs (other than daily baby ASA). Denies tobacco use, alcohol use  PREVIOUS GI WORKUP: -Capsule endoscopy 06/2013 for IDA showed polyp in the mid small bowel, mild inflammation in distal ileum (no ulceration). -Colonoscopy 05/2013 for IDA: Internal  hemorrhoids, otherwise normal -EGD 04/2013 for IDA and heme positive stool: Normal esophageal mucosa, gastritis (negative H. pylori), duodenal inflammation (biopsied as peptic duodenitis) in the duodenal bulb, second part of duodenum was normal. -Colonoscopy 01/2010 for screening: Internal hemorrhoids, otherwise normal   Past Medical History:  Diagnosis Date   Acute epididymo-orchitis    Aortic stenosis    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   Arthritis    Diabetes mellitus without complication [facility])    ED (erectile dysfunction)    Essential hypertension, benign    Heart murmur    Hep C w/o coma, chronic (HCC)    History of blood transfusion    History of DVT of lower extremity    History of radiation therapy    Right Lung- 06/22/22-08/08/22- Dr. Antony Blackbird   Hx of echocardiogram    Echo (8/15):  Severe LVH, EF 60-65%, no RWMA, Gr 3 DD, AVR ok (mean 21 mmHg), MAC, mild LAE, mild to mod RAE   Hypercholesterolemia    PONV (postoperative nausea and vomiting)     Surgical History:  He  has a past surgical history that includes Laminectomy (06/09/2005); Knee arthroscopy (Left, 05/09/2009); TEE without cardioversion (11/07/2003); Colonoscopy (01/07/2010); Esophagogastroduodenoscopy (N/A, 04/24/2013); Bunionectomy (Bilateral, 05/09/2006); Inguinal hernia repair (Right); Tonsillectomy; Cardiac catheterization (10/01/2013); Aortic valve replacement (N/A, 11/19/2013); Intraoprative transesophageal echocardiogram (N/A, 11/19/2013); left and right heart catheterization with coronary angiogram (N/A, 10/01/2013); Spinal fusion; Video bronchoscopy with endobronchial ultrasound (N/A, 06/02/2022); and IR IMAGING GUIDED PORT INSERTION (06/30/2022). Family History:  His family history includes Diabetes in his father and mother; Hypertension in his unknown relative; Stroke in  his father and mother. Social History:   reports that he quit smoking about 31 years ago. His smoking use included cigarettes.  He has a 46.50 pack-year smoking history. He has never used smokeless tobacco. He reports that he does not drink alcohol and does not use drugs.  Prior to Admission medications   Medication Sig Start Date End Date Taking? Authorizing Provider  acetaminophen (TYLENOL) 500 MG tablet Take 500 mg by mouth every 6 (six) hours as needed for mild pain or moderate pain.   Yes [provider]  amLODipine (NORVASC) 10 MG tablet Take 1 tablet (10 mg total) by mouth daily. 06/06/22  Yes Amin, Loura Halt, MD  aspirin EC 81 MG EC tablet Take 1 tablet (81 mg total) by mouth daily. 11/24/13  Yes Gold, Wayne E, PA-C  atorvastatin (LIPITOR) 20 MG tablet Take 20 mg by mouth at bedtime.   Yes [provider]  fluticasone-salmeterol (ADVAIR HFA) 230-21 MCG/ACT inhaler Inhale 2 puffs into the lungs 2 (two) times daily. 09/28/22  Yes Luciano Cutter, MD  hydrochlorothiazide (HYDRODIURIL) 12.5 MG tablet Take 1 tablet (12.5 mg total) by mouth daily. 06/06/22  Yes Amin, Ankit Chirag, MD  lidocaine-prilocaine (EMLA) cream Apply 1 Application topically as needed. 06/17/22  Yes Heilingoetter, Cassandra L, PA-C  metFORMIN (GLUCOPHAGE) 1000 MG tablet Take 1,000 mg by mouth 2 (two) times daily with a meal.   Yes [provider]  Multiple Vitamin (MULTIVITAMIN WITH MINERALS) TABS tablet Take 1 tablet by mouth daily.   Yes [provider]  predniSONE (DELTASONE) 10 MG tablet 6 tablet p.o. daily for 1 week followed by 5 tablets p.o. daily for 1 week followed by 3 tablets p.o. daily for 1 week, followed by 2 tablets p.o. daily for 1 week followed by 1 tablet p.o. daily for 1 week. 09/28/22  Yes Luciano Cutter, MD  SF 5000 PLUS 1.1 % CREA dental cream Place 1 Application onto teeth daily. 02/20/18  Yes [provider]  sucralfate (CARAFATE) 1 g tablet Take 1 tablet (1 g total) by mouth 4 (four) times daily -  with meals and at bedtime. Crush and dissolve in 10 mL of warm water, swallow 20 min  before meals 07/12/22  Yes Antony Blackbird, MD  glucose blood test strip  01/15/14   [provider]  glucose blood test strip as directed DX 250 01/15/14   [provider]    Current Facility-Administered Medications  Medication Dose Route Frequency Provider Last Rate Last Admin   acetaminophen (TYLENOL) tablet 650 mg  650 mg Oral Q6H PRN Charlsie Quest, MD       Or   acetaminophen (TYLENOL) suppository 650 mg  650 mg Rectal Q6H PRN Charlsie Quest, MD       albuterol (PROVENTIL) (2.5 MG/3ML) 0.083% nebulizer solution 2.5 mg  2.5 mg Nebulization Q4H PRN Charlsie Quest, MD       amLODipine (NORVASC) tablet 10 mg  10 mg Oral Daily [place], Alvira Philips, DO       [START ON 10/05/2022] atorvastatin (LIPITOR) tablet 20 mg  20 mg Oral Daily [place], Eric J, DO       ceFEPIme (MAXIPIME) 2 g in sodium chloride 0.9 % 100 mL IVPB  2 g Intravenous Q12H Charlsie Quest, MD   Stopped at 10/04/22 0551   Chlorhexidine Gluconate Cloth 2 % PADS 6 each  6 each Topical Daily Charlsie Quest, MD   6 each at 10/04/22 1113  enoxaparin (LOVENOX) injection 40 mg  40 mg Subcutaneous Q24H Darreld Mclean R, MD   40 mg at 10/03/22 2306   guaiFENesin (MUCINEX) 12 hr tablet 600 mg  600 mg Oral BID PRN Charlsie Quest, MD       insulin aspart (novoLOG) injection 0-5 Units  0-5 Units Subcutaneous QHS Charlsie Quest, MD   3 Units at 10/03/22 2316   insulin aspart (novoLOG) injection 0-9 Units  0-9 Units Subcutaneous TID WC Charlsie Quest, MD       ondansetron (ZOFRAN) tablet 4 mg  4 mg Oral Q6H PRN Charlsie Quest, MD       Or   ondansetron (ZOFRAN) injection 4 mg  4 mg Intravenous Q6H PRN Darreld Mclean R, MD       potassium chloride 10 mEq in 100 mL IVPB  10 mEq Intravenous Q1 Hr x 6 Luiz Iron, NP 100 mL/hr at 10/04/22 1112 10 mEq at 10/04/22 1112   senna-docusate (Senokot-S) tablet 1 tablet  1 tablet Oral QHS PRN Charlsie Quest, MD       sodium chloride flush (NS) 0.9 % injection 3 mL  3 mL Intravenous Q12H  Darreld Mclean R, MD   3 mL at 10/04/22 1113    Allergies as of 10/03/2022 - Review Complete 10/03/2022  Allergen Reaction Noted   Azithromycin Swelling 06/03/2022   Lisinopril  06/03/2022   Ezetimibe-simvastatin  10/30/2013   Metoprolol  01/22/2020   Rosuvastatin  10/30/2013    Review of Systems  Constitutional:  Positive for fever, malaise/fatigue and weight loss.  HENT:  Negative for hearing loss and tinnitus.   Eyes:  Negative for blurred vision and double vision.  Respiratory:  Positive for cough. Negative for hemoptysis.   Cardiovascular:  Negative for chest pain.  Gastrointestinal:  Negative for abdominal pain, blood in stool, constipation, diarrhea, heartburn, melena, nausea and vomiting.  Genitourinary:  Negative for dysuria and urgency.  Musculoskeletal:  Negative for myalgias and neck pain.  Skin:  Negative for itching and rash.  Neurological:  Positive for weakness. Negative for loss of consciousness.  Psychiatric/Behavioral:  Negative for depression and suicidal ideas.        Physical Exam:  Vital signs in last 24 hours: Temp:  [97.9 F (36.6 C)-103.1 F (39.5 C)] 99.7 F (37.6 C) (05/28 0800) Pulse Rate:  [84-101] 88 (05/28 0018) Resp:  [10-18] 16 (05/28 0018) BP: (129-183)/(76-92) 141/81 (05/28 0800) SpO2:  [98 %-100 %] 100 % (05/28 0018) Weight:  [54.4 kg] 54.4 kg (05/27 1530) Last BM Date : 10/02/22 Last BM recorded by nurses in past 5 days No data recorded  Physical Exam Constitutional:      Comments: Frail, ill appearing, malnourished male  HENT:     Head: Normocephalic and atraumatic.     Nose: Nose normal. No congestion.     Mouth/Throat:     Mouth: Mucous membranes are moist.  Eyes:     General: No scleral icterus.    Extraocular Movements: Extraocular movements intact.  Cardiovascular:     Rate and Rhythm: Normal rate and regular rhythm.  Pulmonary:     Effort: Pulmonary effort is normal. No respiratory distress.  Abdominal:     General:  Abdomen is flat. Bowel sounds are normal. There is no distension.     Palpations: Abdomen is soft. There is no mass.     Tenderness: There is no abdominal tenderness. There is no guarding or rebound.     Hernia:  No hernia is present.  Musculoskeletal:     Cervical back: Normal range of motion and neck supple.  Skin:    General: Skin is warm and dry.     Coloration: Skin is not jaundiced.  Neurological:     General: No focal deficit present.     Mental Status: He is alert and oriented to person, place, and time.  Psychiatric:        Mood and Affect: Mood normal.        Behavior: Behavior normal.        Thought Content: Thought content normal.        Judgment: Judgment normal.      LAB RESULTS: Recent Labs    10/03/22 1543 10/04/22 0500  WBC 11.0* 10.8*  HGB 10.1* 10.2*  HCT 33.0* 32.6*  PLT 265 252   BMET Recent Labs    10/03/22 1543 10/04/22 0500  NA 145 146*  K 3.9 2.9*  CL 108 111  CO2 25 29  GLUCOSE 599* 74  BUN 35* 23  CREATININE 1.22 0.79  CALCIUM 9.3 9.1   LFT Recent Labs    10/03/22 1543  PROT 7.6  ALBUMIN 2.4*  AST 16  ALT 14  ALKPHOS 70  BILITOT 0.7   PT/INR Recent Labs    10/03/22 1543  LABPROT 14.8  INR 1.1    STUDIES: CT CHEST W CONTRAST  Result Date: 10/04/2022 CLINICAL DATA:  History of non-small-cell lung cancer and HCV cirrhosis status post segment 5 ablation in 2019 with new weakness, cough, and altered mental status * Tracking Code: BO * EXAM: CT CHEST WITH CONTRAST TECHNIQUE: Multidetector CT imaging of the chest was performed during intravenous contrast administration. RADIATION DOSE REDUCTION: This exam was performed according to the departmental dose-optimization program which includes automated exposure control, adjustment of the mA and/or kV according to patient size and/or use of iterative reconstruction technique. CONTRAST:  75mL OMNIPAQUE IOHEXOL 300 MG/ML  SOLN COMPARISON:  Chest radiograph dated 10/03/2022, CT chest dated  08/29/2022, MR abdomen dated 10/09/2020 FINDINGS: Cardiovascular: Right chest wall port terminates at the superior cavoatrial junction. Prior aortic valve replacement. Normal heart size. No significant pericardial fluid/thickening. Great vessels are normal in course and caliber. No central pulmonary emboli. Coronary artery calcifications and aortic atherosclerosis. Mediastinum/Nodes: Imaged thyroid gland without nodules meeting criteria for imaging follow-up by size. Normal esophagus. No pathologically enlarged axillary, supraclavicular, mediastinal, or hilar lymph nodes. Previously noted precarinal lymph node has decreased in size, measuring 7 mm (2:65), previously 11 mm, and subcarinal lymph node measures 9 mm (2:72), previously 13 mm. Lungs/Pleura: The central airways are patent. Upper lobe predominant centrilobular and paraseptal emphysema. Thick-walled cavitary lesion along the medial suprahilar right upper lobe is decreased in size, measuring 4.4 x 3.6 cm, previously 4.6 x 3.9 cm with interval increased conspicuity of a peripheral nodular component along the posteromedial aspect measuring 1.3 x 0.8 cm (6:38). There is increased adjacent right apical consolidation and interstitial thickening. Previously noted right lower lobe consolidation has largely resolved with a small amount of residual consolidation along the superior segment right lower lobe. Dependent ground-glass density in the right lower lobe is also seen. Left upper lobe peribronchovascular consolidation has largely resolved. No pneumothorax. Interval resolution of right pleural effusion. No pleural effusion. Upper abdomen: Partially imaged bilateral simple renal cysts. No specific follow-up imaging recommended. Subcentimeter hypoattenuating focus within the partially imaged inferior right hepatic lobe (7:55) correspond to suspected prior ablation site noted on MRI. Musculoskeletal: No acute  or abnormal lytic or blastic osseous lesions. Median  sternotomy wires are nondisplaced. IMPRESSION: 1. Decreased size of thick-walled cavitary lesion along the medial suprahilar right upper lobe with interval increased conspicuity of a peripheral nodular component. Increased adjacent right apical consolidation and interstitial thickening, likely infectious/inflammatory. Attention on follow-up. 2. Previously noted right lower lobe consolidation has largely resolved with a small amount of residual consolidation along the superior segment right lower lobe. Dependent ground-glass density in the right lower lobe is also seen. Left upper lobe peribronchovascular consolidation has largely resolved. 3. Previously noted precarinal and subcarinal lymph nodes have decreased in size, likely reactive. 4. Aortic Atherosclerosis (ICD10-I70.0) and Emphysema (ICD10-J43.9). Coronary artery calcifications. Assessment for potential risk factor modification, dietary therapy or pharmacologic therapy may be warranted, if clinically indicated. Electronically Signed   By: Agustin Cree M.D.   On: 10/04/2022 08:54   DG Chest Port 1 View  Result Date: 10/03/2022 CLINICAL DATA:  Sepsis. Weakness. Cough. EXAM: PORTABLE CHEST 1 VIEW COMPARISON:  Chest CT 08/29/2022 FINDINGS: Right chest port remains in place. Chronic right lung volume loss with right upper lobe pleuroparenchymal opacity. The cavitary lesion on CT is not well-defined by radiograph. There is improving right infrahilar opacity from prior CT. The previous left apical opacity is not definitively seen by radiograph. Prior median sternotomy with prosthetic aortic valve. The heart is normal in size. No significant pleural effusion or pneumothorax. IMPRESSION: 1. Chronic right lung volume loss with right upper lobe pleuroparenchymal opacity. The cavitary lesion on CT is not well-defined by radiograph. 2. Improving right infrahilar opacity from prior CT. 3. Previous left apical opacity is not definitively seen by radiograph. Electronically  Signed   By: Narda Rutherford M.D.   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 Mild-moderate narrowed appearance of upper thoracic esophagus.  13 mm barium tablet did not pass on MBS 4/24 so tablet was not attempted during this study. Moderate esophageal dysmotility with tertiary contractions.  Chronic anemia - hgb 10.2, stable  SIRS - on broad spectrum antibiotics with vancomycin, cefepime, and Flagyl. - wbc 10.8, improving  SCC of lung  DMII COPD - on chronic O2 via [place]   Plan   Tentatively plan for EGD with dilation. Will discuss with Dr. Tomasa Rand about timing.  I thoroughly discussed the procedures to include nature, alternatives, benefits, and risks including but not limited to bleeding, perforation, infection, anesthesia/cardiac and pulmonary complications. Patient provides understanding and gave verbal consent to proceed. Continue Protonix IV 40mg  BID Continue diet as tolerated  ADDENDUM 2:21PM Plan for EGD Thursday 5/30.   Thank you for your kind consultation, we will continue to follow.   Jaquetta Currier Leanna Sato  10/04/2022, 11:30 AM

## 2022-10-04 NOTE — Inpatient Diabetes Management (Signed)
Inpatient Diabetes Program Recommendations  AACE/ADA: New Consensus Statement on Inpatient Glycemic Control (2015)  Target Ranges:  Prepandial:   less than 140 mg/dL      Peak postprandial:   less than 180 mg/dL (1-2 hours)      Critically ill patients:  140 - 180 mg/dL   Lab Results  Component Value Date   GLUCAP 78 10/04/2022   HGBA1C 6.8 (H) 06/03/2022    Review of Glycemic Control  Latest Reference Range & Units 10/03/22 15:35 10/03/22 18:32 10/03/22 20:16 10/03/22 23:14 10/04/22 07:48  Glucose-Capillary 70 - 99 mg/dL 191 (HH) 478 (H) 295 (H) 316 (H) 78   Diabetes history: DM 2 Outpatient Diabetes medications:  Metformin 1000 mg bid Prednisone taper Current orders for Inpatient glycemic control:  Novolog 0-9 units tid with meals and HS  Inpatient Diabetes Program Recommendations:    Note patient hyperglycemic on admit.  Patient was on steroid taper prior to admit which likely contributed to hyperglycemia.  Blood sugar improved today.  Will follow.   Thanks  Beryl Meager, RN, BC-ADM Inpatient Diabetes Coordinator Pager [phone]  (8a-5p)

## 2022-10-04 NOTE — Evaluation (Signed)
Physical Therapy Evaluation Patient Details Name: [name] MRN: [id number] DOB: [birth year] Today's Date: 10/04/2022  History of Present Illness  82 yo male presents to therapy s/p admission to hospital on 10/03/2022 due to fever, productive cough and generalized weakness. Pt has significant hx of squamous cell cancer of R lung, underwent chemoradiation completing course 3/21 with partial response. Pt recently seen by pulmonologist and on prednisone taper and 2 L/min continuous supplemental O2. Swallow study indicates evidence of small volume aspiration and suspicious for upper esophageal stricture. Upon presentation to ED blood glucose levels 565 mg/dL and sepsis work up due to possible PNA and started ABX. 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  Clinical Impression    Pt admitted with above diagnosis.  Pt currently with functional limitations due to the deficits listed below (see PT Problem List). Pt in bed when PT arrived. Pt had returned from chest CT this am. Wife present t/o, son arrived toward end of eval, nursing staff in and out of room intermittently. Pt required min cues for supine to sit with min guard, min guard for SPT at RW bed to recliner with cues for proper UE and AD placement,  min A for STS from recliner with cues. Pt required min guard for gait tasks with RW, 4 L/min with O2 saturation 98-100% for 60 feet pt limited due to fatigue. Pt denies dizziness, pain and SOB.  Pt left seated in recliner, all needs in place, family and nursing staff present. Pt will benefit from acute skilled PT in current and next venue to increase their independence and safety with mobility to allow discharge.       Recommendations for follow up therapy are one component of a multi-disciplinary discharge planning process, led by the attending  physician.  Recommendations may be updated based on patient status, additional functional criteria and insurance authorization.  Follow Up Recommendations       Assistance Recommended at Discharge Intermittent Supervision/Assistance  Patient can return home with the following  A little help with walking and/or transfers;A little help with bathing/dressing/bathroom;Assistance with cooking/housework;Assist for transportation;Help with stairs or ramp for entrance    Equipment Recommendations Other (comment) (TBD RW vs Rollator)  Recommendations for Other Services       Functional Status Assessment Patient has had a recent decline in their functional status and demonstrates the ability to make significant improvements in function in a reasonable and predictable amount of time.     Precautions / Restrictions Precautions Precautions: Fall Restrictions Weight Bearing Restrictions: No      Mobility  Bed Mobility Overal bed mobility: Needs Assistance Bed Mobility: Supine to Sit     Supine to sit: Min guard     General bed mobility comments: min cues, HOB elevated and use of bed rails    Transfers Overall transfer level: Needs assistance Equipment used: Rolling walker (2 wheels) Transfers: Sit to/from Stand, Bed to chair/wheelchair/BSC Sit to Stand: Min assist (from recliner) Stand pivot transfers: Min guard, From elevated surface (EOB)         General transfer comment: cues for safety, proper UE and AD placement    Ambulation/Gait Ambulation/Gait assistance: Min guard Gait Distance (Feet): 60 Feet Assistive device: Rolling walker (2 wheels) Gait Pattern/deviations: Step-through pattern, Trunk flexed Gait velocity: decreased        Stairs  Wheelchair Mobility    Modified Rankin (Stroke Patients Only)       Balance Overall balance assessment: Mild deficits observed, not formally tested (no fall hx)                                            Pertinent Vitals/Pain Pain Assessment Pain Assessment: No/denies pain    Home Living Family/patient expects to be discharged to:: Private residence Living Arrangements: Spouse/significant other Available Help at Discharge: Family Type of Home: House Home Access: Stairs to enter Entrance Stairs-Rails: Right;Left;Can reach both Secretary/administrator of Steps: 3 Alternate Level Stairs-Number of Steps: 12 Home Layout: Two level Home Equipment: None      Prior Function Prior Level of Function : Independent/Modified Independent             Mobility Comments: IND with all ADLs, self care tasks, No AD ADLs Comments: wife able to provide insight to PLOF     Hand Dominance        Extremity/Trunk Assessment        Lower Extremity Assessment Lower Extremity Assessment: Generalized weakness    Cervical / Trunk Assessment Cervical / Trunk Assessment:  (slight head forward)  Communication   Communication: No difficulties  Cognition Arousal/Alertness: Awake/alert Behavior During Therapy: WFL for tasks assessed/performed Overall Cognitive Status: Impaired/Different from baseline Area of Impairment: Memory                                        General Comments      Exercises     Assessment/Plan    PT Assessment Patient needs continued PT services  PT Problem List Decreased strength;Decreased activity tolerance;Decreased balance;Decreased mobility;Decreased coordination       PT Treatment Interventions DME instruction;Gait training;Stair training;Functional mobility training;Therapeutic activities;Therapeutic exercise;Balance training;Neuromuscular re-education;Patient/family education    PT Goals (Current goals can be found in the Care Plan section)  Acute Rehab PT Goals Patient Stated Goal: go home PT Goal Formulation: With patient Time For Goal Achievement: 10/18/22 Potential to Achieve Goals: Good    Frequency Min  1X/week     Co-evaluation               AM-PAC PT "6 Clicks" Mobility  Outcome Measure Help needed turning from your back to your side while in a flat bed without using bedrails?: A Little Help needed moving from lying on your back to sitting on the side of a flat bed without using bedrails?: A Little Help needed moving to and from a bed to a chair (including a wheelchair)?: A Little Help needed standing up from a chair using your arms (e.g., wheelchair or bedside chair)?: A Little Help needed to walk in hospital room?: A Little Help needed climbing 3-5 steps with a railing? : A Lot 6 Click Score: 17    End of Session Equipment Utilized During Treatment: Gait belt;Oxygen (pt on 4 L/min when PT arrived, session conducted on 4 L/min 98-100% and commuication with nurse whom indicates pt supplemental O2 2 L/min) Activity Tolerance: Patient tolerated treatment well Patient left: in chair;with call bell/phone within reach;with family/visitor present;with nursing/sitter in room Nurse Communication: Mobility status PT Visit Diagnosis: Unsteadiness on feet (R26.81);Muscle weakness (generalized) (M62.81)    Time: 4098-1191 PT Time Calculation (min) (ACUTE ONLY): 42 min  Charges:   PT Evaluation $PT Eval Low Complexity: 1 Low PT Treatments $Gait Training: 8-22 mins $Therapeutic Activity: 8-22 mins        Johnny Bridge, PT Acute Rehab   Jacqualyn Posey 10/04/2022, 12:14 PM

## 2022-10-04 NOTE — Progress Notes (Signed)
PROGRESS NOTE    [name]  ZOX:[id number] DOB: [birth year] DOA: 10/03/2022 PCP: Daniel Bussing, MD    Brief Narrative:   [name] is a 82 y.o. 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 Patient states today he began to have fevers at home.  He has had chills but no diaphoresis.  He has chronic cough which is nonproductive at baseline but today he began to have yellow sputum production with small streaks of blood.  He has not had any worsening shortness of breath compared to his baseline.  He denies nausea, vomiting, abdominal pain, diarrhea.  He has had increased urinary frequency without dysuria.     Patient was found to have a large centrally necrotic right upper lobe lung mass in January 2024.  He was diagnosed with squamous cell carcinoma of the lung.  He underwent concurrent chemoradiation with carboplatin and paclitaxel s/p 7 cycles with last dose 07/28/2022.  He has had partial response.  There was concern for radiation pneumonitis resulting in acute hypoxemic respiratory failure.  He was seen by pulmonology and started on prednisone taper as well as 2 L supplemental O2 via [place] continuously last week.   Patient also had recent swallow study which showed evidence of small volume aspiration and findings suspicious for upper esophageal stricture.   In the ED, BP 129/76, pulse 101, RR 18, temp 101.3 F, SpO2 100% on 2 L O2 via [place].  Tmax 103.1 F rectally. Sodium 145, potassium 3.9, bicarb 25, BUN 35, creatinine 1.22, serum glucose 599, anion gap 12, LFTs within normal limits, beta hydroxybutyrate 1.17, lactic acid 2.5, BNP 152. SARS-CoV-2, influenza, RSV PCR negative.  Urinalysis shows negative nitrates, small leukocytes,  11-20 RBCs and WBCs, no bacteria microscopy.  Blood and urine cultures in process. Portable chest x-ray shows chronic right lung volume loss with right upper lobe pleural-parenchymal opacity.  Cavitary lesion on CT is not well-defined by x-ray.  Improving right infrahilar opacity from prior CT noted. Patient was given 1 L LR, IV vancomycin and cefepime.  The hospitalist service was consulted to admit for further evaluation and management.  Assessment & Plan:   SIRS, POA Community-acquired pneumonia, right apical Presenting with fever, tachycardia, lactic acidosis.  Chest x-ray with chronic right lung volume loss with right upper lobe pleural-parenchymal opacity, improved right infrahilar opacity previous left apical opacity not definitively seen.  ET chest with contrast with right apical consolidation and interstitial thickening likely infectious versus inflammatory.  UA not consistent with UTI.  COVID, flu, RSV negative.  Respiratory viral panel negative.  MRSA PCR negative. -- Urine culture: Pending  -- Blood cultures x 2: No growth less than 24 hours -- Discontinue vancomycin given negative MRSA PCR -- Discontinue metronidazole -- Continue cefepime 2 g IV q12h -- CBC daily, monitor fever curve  Acute hypoxemic respiratory failure Secondary to history of COPD plus concern for radiation pneumonitis.  Recently placed on continuous 2 L O2 via [place] as an outpatient by pulmonology.  SpO2 stable on 2 L via [place].   Dysphagia Recent swallow study with evidence for small volume aspiration as well as concern for esophageal stricture.  Patient does report occasional choking episodes when eating. -- SLP evaluation: Pending --  [employer] [facility] consulted for consideration of EGD for esophageal dilation   Type 2 diabetes mellitus with hyperglycemia, without long-term current use of insulin  Hyperglycemic in setting of prednisone use.  No evidence of DKA/HHS.  Hemoglobin A1c 6.8 on 06/03/2022, well-controlled. --  Hold home metformin for now -- SSI for coverage -- CBGs qAC/HS   Squamous cell carcinoma of lung  Follows with oncology Dr. Arbutus Ped.  Chemoradiation on hold currently due to concern for radiation pneumonitis.   Moderate COPD (chronic obstructive pulmonary disease) No significant wheezing on admission.  Continue Advair and albuterol as needed.  Follows with pulmonology, Dr. Everardo All outpatient.   Hyperlipidemia associated  -- Continue atorvastatin.   Hypertension associated with diabetes  -- Continue amlodipine 10 mg p.o. daily -- Hold home HCTZ for now -- Hydralazine 25mg  PO q6h PRN SBP >165  Severe protein calorie malnutrition Body mass index is 16.74 kg/m.  Severe muscle wasting/fat depletion on physical exam --Dietitian consult   DVT prophylaxis: enoxaparin (LOVENOX) injection 40 mg Start: 10/03/22 2200    Code Status: DNR Family Communication: Updated spouse present at bedside this morning  Disposition Plan:  Level of care: Telemetry Status is: Observation The patient remains OBS appropriate and will d/c before 2 midnights.    Consultants:   gastroenterology  Procedures:  None  Antimicrobials:  Vancomycin 5/27 - 5/27 Metronidazole 5/27 - 5/28  Cefepime 5/27>>   Subjective: Patient seen examined bedside, resting comfortably.  Lying in bed.  Seems to be having trouble tolerating oral intake.  Spouse present at bedside.  RN present as well.  Patient denies trouble with some of his pills but does have an issue with larger tablets.  Discussed findings of CT scan concerning for pneumonia right apex.  Currently afebrile and remains on IV cefepime.  Reviewed recent esophagram earlier this month notable for a stricture mid thoracic esophagus.  Will consult GI for evaluation for consideration of EGD and dilation.  No other questions or concerns at this time.  Denies headache, no dizziness, no chest pain, no palpitations, no abdominal pain, no current fever, no  chills/night sweats, no nausea/vomiting/diarrhea, no focal weakness, no paresthesias.  No acute events overnight per nurse staff.  Objective: Vitals:   10/03/22 1900 10/03/22 2010 10/04/22 0018 10/04/22 0800  BP: (!) 141/90 (!) 154/91 (!) 183/92 (!) 141/81  Pulse: 87 84 88   Resp: 14 18 16    Temp:  98.5 F (36.9 C) 97.9 F (36.6 C) 99.7 F (37.6 C)  TempSrc:  Oral Oral Oral  SpO2: 99% 100% 100%   Weight:      Height:        Intake/Output Summary (Last 24 hours) at 10/04/2022 1043 Last data filed at 10/04/2022 0730 Gross per 24 hour  Intake 2257.66 ml  Output 300 ml  Net 1957.66 ml   Filed Weights   10/03/22 1530  Weight: 54.4 kg    Examination:  Physical Exam: GEN: NAD, alert and oriented x 3, chronically ill/thin/cachectic in appearance, appears older than stated age HEENT: NCAT, PERRL, EOMI, sclera clear, MMM PULM: Diminished breath sounds bilateral bases, normal respiratory effort without accessory muscle use, no wheezing/crackles, on 2 L nasal cannula which is his baseline requirement CV: RRR w/o M/G/R GI: abd soft, NTND, NABS, no R/G/M MSK: no peripheral edema, moves all extremities dependently NEURO: CN II-XII intact, no focal deficits, sensation to light touch intact PSYCH: Depressed mood, flat affect Integumentary: No concerning rashes or lesions/wounds noted on exposed skin surfaces  Data Reviewed: I have personally reviewed following labs and imaging studies  CBC: Recent Labs  Lab 10/03/22 1543 10/04/22 0500  WBC 11.0* 10.8*  NEUTROABS 10.0*  --   HGB 10.1* 10.2*  HCT 33.0* 32.6*  MCV 89.7 88.6  PLT 265 252   Basic Metabolic Panel: Recent Labs  Lab 10/03/22 1543 10/04/22 0500  NA 145 146*  K 3.9 2.9*  CL 108 111  CO2 25 29  GLUCOSE 599* 74  BUN 35* 23  CREATININE 1.22 0.79  CALCIUM 9.3 9.1  MG  --  1.8   GFR: Estimated Creatinine Clearance: 57.6 mL/min (by C-G formula based on SCr of 0.79 mg/dL). Liver Function Tests: Recent Labs   Lab 10/03/22 1543  AST 16  ALT 14  ALKPHOS 70  BILITOT 0.7  PROT 7.6  ALBUMIN 2.4*   No results for input(s): "LIPASE", "AMYLASE" in the last 168 hours. No results for input(s): "AMMONIA" in the last 168 hours. Coagulation Profile: Recent Labs  Lab 10/03/22 1543  INR 1.1   Cardiac Enzymes: No results for input(s): "CKTOTAL", "CKMB", "CKMBINDEX", "TROPONINI" in the last 168 hours. BNP (last 3 results) No results for input(s): "PROBNP" in the last 8760 hours. HbA1C: No results for input(s): "HGBA1C" in the last 72 hours. CBG: Recent Labs  Lab 10/03/22 1535 10/03/22 1832 10/03/22 2016 10/03/22 2314 10/04/22 0748  GLUCAP 565* 493* 478* 316* 78   Lipid Profile: No results for input(s): "CHOL", "HDL", "LDLCALC", "TRIG", "CHOLHDL", "LDLDIRECT" in the last 72 hours. Thyroid Function Tests: No results for input(s): "TSH", "T4TOTAL", "FREET4", "T3FREE", "THYROIDAB" in the last 72 hours. Anemia Panel: No results for input(s): "VITAMINB12", "FOLATE", "FERRITIN", "TIBC", "IRON", "RETICCTPCT" in the last 72 hours. Sepsis Labs: Recent Labs  Lab 10/03/22 1540 10/03/22 1839 10/04/22 0500  PROCALCITON  --   --  0.46  LATICACIDVEN 2.5* 1.9  --     Recent Results (from the past 240 hour(s))  Culture, blood (Routine x 2)     Status: None (Preliminary result)   Collection Time: 10/03/22  3:35 PM   Specimen: BLOOD  Result Value Ref Range Status   Specimen Description   Final    BLOOD LEFT ANTECUBITAL Performed at [facility], [address], [place], [place] [address]    Special Requests   Final    BOTTLES DRAWN AEROBIC AND ANAEROBIC Blood Culture results may not be optimal due to an inadequate volume of blood received in culture bottles Performed at [facility], [address] [place], [place] [address]    Culture   Final    NO GROWTH < 24 HOURS Performed at [facility], [address] [place], [place] [address]    Report  Status PENDING  Incomplete  Culture, blood (Routine x 2)     Status: None (Preliminary result)   Collection Time: 10/03/22  3:41 PM   Specimen: BLOOD  Result Value Ref Range Status   Specimen Description   Final    BLOOD RIGHT ANTECUBITAL Performed at [facility], [address], [place], [place] [address]    Special Requests   Final    BOTTLES DRAWN AEROBIC AND ANAEROBIC Blood Culture adequate volume Performed at [facility], [address], [place], [place] [address]    Culture   Final    NO GROWTH < 24 HOURS Performed at [facility], [address] [place], [place] [address]    Report Status PENDING  Incomplete  Resp panel by RT-PCR (RSV, Flu A&B, Covid) Anterior Nasal Swab     Status: None   Collection Time: 10/03/22  3:57 PM   Specimen: Anterior Nasal Swab  Result Value Ref Range Status   SARS Coronavirus 2 by RT PCR NEGATIVE NEGATIVE Final    Comment: (NOTE) SARS-CoV-2 target nucleic acids are NOT DETECTED.  The SARS-CoV-2 RNA is generally detectable in upper respiratory specimens during the acute phase of infection. The lowest concentration of SARS-CoV-2 viral copies this assay can detect is 138 copies/mL. A negative result does not preclude SARS-Cov-2 infection and should not be used as the sole basis for treatment or other patient management decisions. A negative result may occur with  improper specimen collection/handling, submission of specimen other than nasopharyngeal swab, presence of viral mutation(s) within the areas targeted by this assay, and inadequate number of viral copies(<138 copies/mL). A negative result must be combined with clinical observations, patient history, and epidemiological information. The expected result is Negative.  Fact Sheet for Patients:  BloggerCourse.com  Fact Sheet for Healthcare Providers:  SeriousBroker.it  This test is no t yet  approved or cleared by the [place] FDA and  has been authorized for detection and/or diagnosis of SARS-CoV-2 by FDA under an Emergency Use Authorization (EUA). This EUA will remain  in effect (meaning this test can be used) for the duration of the COVID-19 declaration under Section 564(b)(1) of the Act, 21 U.S.C.section 360bbb-3(b)(1), unless the authorization is terminated  or revoked sooner.       Influenza A by PCR NEGATIVE NEGATIVE Final   Influenza B by PCR NEGATIVE NEGATIVE Final    Comment: (NOTE) The Xpert Xpress SARS-CoV-2/FLU/RSV plus assay is intended as an aid in the diagnosis of influenza from Nasopharyngeal swab specimens and should not be used as a sole basis for treatment. Nasal washings and aspirates are unacceptable for Xpert Xpress SARS-CoV-2/FLU/RSV testing.  Fact Sheet for Patients: BloggerCourse.com  Fact Sheet for Healthcare Providers: SeriousBroker.it  This test is not yet approved or cleared by the [place] FDA and has been authorized for detection and/or diagnosis of SARS-CoV-2 by FDA under an Emergency Use Authorization (EUA). This EUA will remain in effect (meaning this test can be used) for the duration of the COVID-19 declaration under Section 564(b)(1) of the Act, 21 U.S.C. section 360bbb-3(b)(1), unless the authorization is terminated or revoked.     Resp Syncytial Virus by PCR NEGATIVE NEGATIVE Final    Comment: (NOTE) Fact Sheet for Patients: BloggerCourse.com  Fact Sheet for Healthcare Providers: SeriousBroker.it  This test is not yet approved or cleared by the [place] FDA and has been authorized for detection and/or diagnosis of SARS-CoV-2 by FDA under an Emergency Use Authorization (EUA). This EUA will remain in effect (meaning this test can be used) for the duration of the COVID-19 declaration under Section 564(b)(1) of  the Act, 21 U.S.C. section 360bbb-3(b)(1), unless the authorization is terminated or revoked.  Performed at [facility], [address], [place], [place] [address]   MRSA Next Gen by PCR, Nasal     Status: None   Collection Time: 10/03/22 11:18 PM   Specimen: Nasopharyngeal Swab; Nasal Swab  Result Value Ref Range Status   MRSA by PCR Next Gen NOT DETECTED NOT DETECTED Final    Comment: (NOTE) The GeneXpert MRSA Assay (FDA approved for NASAL specimens only), is one component of a comprehensive MRSA colonization surveillance program. It is not intended to diagnose MRSA infection  nor to guide or monitor treatment for MRSA infections. Test performance is not FDA approved in patients less than 31 years old. Performed at [facility], [address] [place], [place] [address]   Respiratory (~20 pathogens) panel by PCR     Status: None   Collection Time: 10/03/22 11:43 PM   Specimen: Nasopharyngeal Swab; Respiratory  Result Value Ref Range Status   Adenovirus NOT DETECTED NOT DETECTED Final   Coronavirus 229E NOT DETECTED NOT DETECTED Final    Comment: (NOTE) The Coronavirus on the Respiratory Panel, DOES NOT test for the novel  Coronavirus (2019 nCoV)    Coronavirus HKU1 NOT DETECTED NOT DETECTED Final   Coronavirus NL63 NOT DETECTED NOT DETECTED Final   Coronavirus OC43 NOT DETECTED NOT DETECTED Final   Metapneumovirus NOT DETECTED NOT DETECTED Final   Rhinovirus / Enterovirus NOT DETECTED NOT DETECTED Final   Influenza A NOT DETECTED NOT DETECTED Final   Influenza B NOT DETECTED NOT DETECTED Final   Parainfluenza Virus 1 NOT DETECTED NOT DETECTED Final   Parainfluenza Virus 2 NOT DETECTED NOT DETECTED Final   Parainfluenza Virus 3 NOT DETECTED NOT DETECTED Final   Parainfluenza Virus 4 NOT DETECTED NOT DETECTED Final   Respiratory Syncytial Virus NOT DETECTED NOT DETECTED Final   Bordetella pertussis NOT DETECTED NOT DETECTED Final    Bordetella Parapertussis NOT DETECTED NOT DETECTED Final   Chlamydophila pneumoniae NOT DETECTED NOT DETECTED Final   Mycoplasma pneumoniae NOT DETECTED NOT DETECTED Final    Comment: Performed at [facility], [address], [place], [place] [address]         Radiology Studies: CT CHEST W CONTRAST  Result Date: 10/04/2022 CLINICAL DATA:  History of non-small-cell lung cancer and HCV cirrhosis status post segment 5 ablation in 2019 with new weakness, cough, and altered mental status * Tracking Code: BO * EXAM: CT CHEST WITH CONTRAST TECHNIQUE: Multidetector CT imaging of the chest was performed during intravenous contrast administration. RADIATION DOSE REDUCTION: This exam was performed according to the departmental dose-optimization program which includes automated exposure control, adjustment of the mA and/or kV according to patient size and/or use of iterative reconstruction technique. CONTRAST:  75mL OMNIPAQUE IOHEXOL 300 MG/ML  SOLN COMPARISON:  Chest radiograph dated 10/03/2022, CT chest dated 08/29/2022, MR abdomen dated 10/09/2020 FINDINGS: Cardiovascular: Right chest wall port terminates at the superior cavoatrial junction. Prior aortic valve replacement. Normal heart size. No significant pericardial fluid/thickening. Great vessels are normal in course and caliber. No central pulmonary emboli. Coronary artery calcifications and aortic atherosclerosis. Mediastinum/Nodes: Imaged thyroid gland without nodules meeting criteria for imaging follow-up by size. Normal esophagus. No pathologically enlarged axillary, supraclavicular, mediastinal, or hilar lymph nodes. Previously noted precarinal lymph node has decreased in size, measuring 7 mm (2:65), previously 11 mm, and subcarinal lymph node measures 9 mm (2:72), previously 13 mm. Lungs/Pleura: The central airways are patent. Upper lobe predominant centrilobular and paraseptal emphysema. Thick-walled cavitary lesion along the medial suprahilar  right upper lobe is decreased in size, measuring 4.4 x 3.6 cm, previously 4.6 x 3.9 cm with interval increased conspicuity of a peripheral nodular component along the posteromedial aspect measuring 1.3 x 0.8 cm (6:38). There is increased adjacent right apical consolidation and interstitial thickening. Previously noted right lower lobe consolidation has largely resolved with a small amount of residual consolidation along the superior segment right lower lobe. Dependent ground-glass density in the right lower lobe is also seen. Left upper lobe peribronchovascular consolidation has largely resolved.  No pneumothorax. Interval resolution of right pleural effusion. No pleural effusion. Upper abdomen: Partially imaged bilateral simple renal cysts. No specific follow-up imaging recommended. Subcentimeter hypoattenuating focus within the partially imaged inferior right hepatic lobe (7:55) correspond to suspected prior ablation site noted on MRI. Musculoskeletal: No acute or abnormal lytic or blastic osseous lesions. Median sternotomy wires are nondisplaced. IMPRESSION: 1. Decreased size of thick-walled cavitary lesion along the medial suprahilar right upper lobe with interval increased conspicuity of a peripheral nodular component. Increased adjacent right apical consolidation and interstitial thickening, likely infectious/inflammatory. Attention on follow-up. 2. Previously noted right lower lobe consolidation has largely resolved with a small amount of residual consolidation along the superior segment right lower lobe. Dependent ground-glass density in the right lower lobe is also seen. Left upper lobe peribronchovascular consolidation has largely resolved. 3. Previously noted precarinal and subcarinal lymph nodes have decreased in size, likely reactive. 4. Aortic Atherosclerosis (ICD10-I70.0) and Emphysema (ICD10-J43.9). Coronary artery calcifications. Assessment for potential risk factor modification, dietary therapy or  pharmacologic therapy may be warranted, if clinically indicated. Electronically Signed   By: Agustin Cree M.D.   On: 10/04/2022 08:54   DG Chest Port 1 View  Result Date: 10/03/2022 CLINICAL DATA:  Sepsis. Weakness. Cough. EXAM: PORTABLE CHEST 1 VIEW COMPARISON:  Chest CT 08/29/2022 FINDINGS: Right chest port remains in place. Chronic right lung volume loss with right upper lobe pleuroparenchymal opacity. The cavitary lesion on CT is not well-defined by radiograph. There is improving right infrahilar opacity from prior CT. The previous left apical opacity is not definitively seen by radiograph. Prior median sternotomy with prosthetic aortic valve. The heart is normal in size. No significant pleural effusion or pneumothorax. IMPRESSION: 1. Chronic right lung volume loss with right upper lobe pleuroparenchymal opacity. The cavitary lesion on CT is not well-defined by radiograph. 2. Improving right infrahilar opacity from prior CT. 3. Previous left apical opacity is not definitively seen by radiograph. Electronically Signed   By: Narda Rutherford M.D.   On: 10/03/2022 16:28        Scheduled Meds:  Chlorhexidine Gluconate Cloth  6 each Topical Daily   enoxaparin (LOVENOX) injection  40 mg Subcutaneous Q24H   insulin aspart  0-5 Units Subcutaneous QHS   insulin aspart  0-9 Units Subcutaneous TID WC   sodium chloride flush  3 mL Intravenous Q12H   Continuous Infusions:  ceFEPime (MAXIPIME) IV Stopped (10/04/22 0551)   metronidazole 500 mg (10/04/22 0957)   potassium chloride 10 mEq (10/04/22 0956)     LOS: 0 days    Time spent: 56 minutes spent on chart review, discussion with nursing staff, consultants, updating family and interview/physical exam; more than 50% of that time was spent in counseling and/or coordination of care.    Alvira Philips [place], DO [facility] Available via Epic secure chat 7am-7pm After these hours, please refer to coverage provider listed on [email or web address] 10/04/2022,  10:43 AM

## 2022-10-05 DIAGNOSIS — C3490 Malignant neoplasm of unspecified part of unspecified bronchus or lung: Secondary | ICD-10-CM | POA: Diagnosis not present

## 2022-10-05 DIAGNOSIS — D500 Iron deficiency anemia secondary to blood loss (chronic): Secondary | ICD-10-CM | POA: Diagnosis not present

## 2022-10-05 DIAGNOSIS — A419 Sepsis, unspecified organism: Secondary | ICD-10-CM

## 2022-10-05 DIAGNOSIS — R6510 Systemic inflammatory response syndrome (SIRS) of non-infectious origin without acute organ dysfunction: Secondary | ICD-10-CM | POA: Diagnosis not present

## 2022-10-05 DIAGNOSIS — E43 Unspecified severe protein-calorie malnutrition: Secondary | ICD-10-CM

## 2022-10-05 DIAGNOSIS — R1310 Dysphagia, unspecified: Secondary | ICD-10-CM | POA: Diagnosis not present

## 2022-10-05 DIAGNOSIS — E1165 Type 2 diabetes mellitus with hyperglycemia: Secondary | ICD-10-CM

## 2022-10-05 DIAGNOSIS — J9601 Acute respiratory failure with hypoxia: Secondary | ICD-10-CM

## 2022-10-05 LAB — BLOOD CULTURE ID PANEL (REFLEXED) - BCID2

## 2022-10-05 LAB — COMPREHENSIVE METABOLIC PANEL
ALT: 14 U/L (ref 0–44)
AST: 21 U/L (ref 15–41)
Albumin: 1.9 g/dL — ABNORMAL LOW (ref 3.5–5.0)
Alkaline Phosphatase: 48 U/L (ref 38–126)
Anion gap: 7 (ref 5–15)
BUN: 24 mg/dL — ABNORMAL HIGH (ref 8–23)
CO2: 28 mmol/L (ref 22–32)
Calcium: 8.8 mg/dL — ABNORMAL LOW (ref 8.9–10.3)
Chloride: 111 mmol/L (ref 98–111)
Creatinine, Ser: 0.83 mg/dL (ref 0.61–1.24)
GFR, Estimated: >60 mL/min (ref >60)
Glucose, Bld: 123 mg/dL — ABNORMAL HIGH (ref 70–99)
Potassium: 3.1 mmol/L — ABNORMAL LOW (ref 3.5–5.1)
Sodium: 146 mmol/L — ABNORMAL HIGH (ref 135–145)
Total Bilirubin: 0.5 mg/dL (ref 0.3–1.2)
Total Protein: 6.6 g/dL (ref 6.5–8.1)

## 2022-10-05 LAB — CBC
HCT: 30.5 % — ABNORMAL LOW (ref 39.0–52.0)
Hemoglobin: 9.2 g/dL — ABNORMAL LOW (ref 13.0–17.0)
MCH: 27 pg (ref 26.0–34.0)
MCHC: 30.2 g/dL (ref 30.0–36.0)
MCV: 89.4 fL (ref 80.0–100.0)
Platelets: 223 10*3/uL (ref 150–400)
RBC: 3.41 MIL/uL — ABNORMAL LOW (ref 4.22–5.81)
RDW: 21.7 % — ABNORMAL HIGH (ref 11.5–15.5)
WBC: 8.2 10*3/uL (ref 4.0–10.5)
nRBC: 0 % (ref 0.0–0.2)

## 2022-10-05 LAB — GLUCOSE, CAPILLARY
Glucose-Capillary: 158 mg/dL — ABNORMAL HIGH (ref 70–99)
Glucose-Capillary: 118 mg/dL — ABNORMAL HIGH (ref 70–99)
Glucose-Capillary: 279 mg/dL — ABNORMAL HIGH (ref 70–99)
Glucose-Capillary: 407 mg/dL — ABNORMAL HIGH (ref 70–99)
Glucose-Capillary: 366 mg/dL — ABNORMAL HIGH (ref 70–99)
Glucose-Capillary: 204 mg/dL — ABNORMAL HIGH (ref 70–99)

## 2022-10-05 LAB — MAGNESIUM: Magnesium: 1.8 mg/dL (ref 1.7–2.4)

## 2022-10-05 LAB — PHOSPHORUS: Phosphorus: 2.7 mg/dL (ref 2.5–4.6)

## 2022-10-05 LAB — URINE CULTURE

## 2022-10-05 LAB — CULTURE, BLOOD (ROUTINE X 2)

## 2022-10-05 LAB — HEMOGLOBIN A1C
Hgb A1c MFr Bld: 11.4 % — ABNORMAL HIGH (ref 4.8–5.6)
Mean Plasma Glucose: 280 mg/dL

## 2022-10-05 LAB — GLUCOSE, RANDOM: Glucose, Bld: 444 mg/dL — ABNORMAL HIGH (ref 70–99)

## 2022-10-05 MED ORDER — SODIUM CHLORIDE 0.45 % IV SOLN: INTRAVENOUS | Status: DC

## 2022-10-05 MED ORDER — INSULIN ASPART 100 UNIT/ML IJ SOLN
10.0000 [IU] | Freq: Once | INTRAMUSCULAR | Status: AC
  Administered 2022-10-05: 10 [IU] via SUBCUTANEOUS

## 2022-10-05 MED ORDER — DEXTROSE 5 % IV SOLN: INTRAVENOUS | Status: DC

## 2022-10-05 MED ORDER — POTASSIUM CHLORIDE CRYS ER 20 MEQ PO TBCR
40.0000 meq | EXTENDED_RELEASE_TABLET | Freq: Once | ORAL | Status: DC
  Filled 2022-10-05: qty 2

## 2022-10-05 MED ORDER — LIVING WELL WITH DIABETES BOOK
Freq: Once | Status: AC
  Filled 2022-10-05 (×2): qty 1

## 2022-10-05 MED ORDER — POTASSIUM CHLORIDE 20 MEQ PO PACK
40.0000 meq | PACK | Freq: Once | ORAL | Status: AC
  Administered 2022-10-05: 40 meq via ORAL
  Filled 2022-10-05: qty 2

## 2022-10-05 NOTE — Progress Notes (Signed)
[facility]                                                                              [name], is a 82 y.o. male, DOB - 09-23-1940, ZOX:[id number] Admit date - 10/03/2022    Outpatient Primary MD for the patient is [name], [name], MD  LOS - 1  days  No chief complaint on file.      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  Patient reported chills, has chronic cough but nonproductive at baseline however on the day of admission started having yellow sputum production with small streaks of blood.   Patient was found to have a large centrally necrotic right upper lobe lung mass in 05/2022, was diagnosed with squamous cell carcinoma of the lung. He underwent concurrent chemoradiation with carboplatin and paclitaxel s/p 7 cycles with last dose 07/28/2022. He has had partial response. There was concern for radiation pneumonitis resulting in acute hypoxemic respiratory failure.  Recent swallow study showed evidence of small volume aspiration and findings suspicious for upper esophageal stricture Patient was admitted for further workup  Assessment & Plan    Principal Problem:   SIRS (systemic inflammatory response syndrome) (HCC), POA Community-acquired pneumonia, right apical - Presenting with fever, tachycardia, lactic acidosis.  CXR with chronic right lung volume loss with RUL pleural parenchymal opacity  -CT chest showed right apical consolidation and interstitial thickening -COVID, flu, RSV negative.  Resp viral panel negative.  MRSA PCR negative  -BC ID 1/4 positive for Staph epidermidis, possibly contaminant -Urine culture + multiple species --Continue IV cefepime  Active  Problems: Chronic hypoxemic respiratory failure -Likely due to #1, and history of COPD, radiation pneumonitis  -Continue O2 2 L via [place], at baseline  Dysphagia - Recent swallow study with evidence for small volume aspiration as well as concern for esophageal stricture.  Patient does report occasional choking episodes when eating. -- SLP: mild aspiration risk, recommended thin liquids, currently on dysphagia 1 diet, noted to be coughing -GI consulted, plan for EGD tomorrow -Continue IV PPI, n.p.o. after midnight   Type 2 diabetes mellitus with hyperglycemia, without long-term current use of insulin  Hyperglycemic in setting of prednisone use.  No evidence of DKA/HHS.  Hemoglobin A1c 6.8 on 06/03/2022, well-controlled. -Continue SSI, hold metformin CBG (last 3)  Recent Labs    10/04/22 2059 10/05/22 0811 10/05/22 1127  GLUCAP 158* 118* 279*      Squamous cell carcinoma of lung  - Follows with oncology Dr. Arbutus Ped.  - Chemoradiation on hold currently due to concern for radiation pneumonitis.   Moderate COPD (chronic obstructive pulmonary disease) -Continue Advair and albuterol as needed.   -Follows with pulmonology, Dr. Everardo All outpatient.   Hyperlipidemia associated  -- Continue Lipitor   Essential hypertension -Continue amlodipine 10 mg daily, hydralazine IV as needed  Hypernatremia -Sodium 146, slowly  trending up, IV fluids changed to D5 -Follow Bmet  Severe protein calorie malnutrition Nutrition Problem: Inadequate oral intake Etiology: dysphagia Signs/Symptoms: per patient/family report Interventions: Magic cup, Ensure Enlive (each supplement provides 350kcal and 20 grams of protein) Estimated body mass index is 16.74 kg/m as calculated from the following:   Height as of this encounter: 5\' 11"  (1.803 m).   Weight as of this encounter: 54.4 kg.  Code Status: DNR DVT Prophylaxis:     Level of Care: Level of care: Telemetry Family Communication:  Disposition  Plan:      Remains inpatient appropriate:   Workup in progress   Procedures:   Consultants:   GI  Antimicrobials:   Anti-infectives (From admission, onward)    Start     Dose/Rate Route Frequency Ordered Stop   10/04/22 1600  vancomycin (VANCOREADY) IVPB 750 mg/150 mL  Status:  Discontinued        750 mg 150 mL/hr over 60 Minutes Intravenous Every 24 hours 10/03/22 1959 10/04/22 0846   10/04/22 0600  ceFEPIme (MAXIPIME) 2 g in sodium chloride 0.9 % 100 mL IVPB        2 g 200 mL/hr over 30 Minutes Intravenous Every 12 hours 10/03/22 1948     10/03/22 2200  metroNIDAZOLE (FLAGYL) IVPB 500 mg  Status:  Discontinued        500 mg 100 mL/hr over 60 Minutes Intravenous Every 12 hours 10/03/22 1928 10/04/22 1059   10/03/22 1930  ceFEPIme (MAXIPIME) 2 g in sodium chloride 0.9 % 100 mL IVPB  Status:  Discontinued        2 g 200 mL/hr over 30 Minutes Intravenous  Once 10/03/22 1928 10/03/22 1948   10/03/22 1600  vancomycin (VANCOCIN) IVPB 1000 mg/200 mL premix        1,000 mg 200 mL/hr over 60 Minutes Intravenous  Once 10/03/22 1551 10/03/22 1824   10/03/22 1600  ceFEPIme (MAXIPIME) 2 g in sodium chloride 0.9 % 100 mL IVPB        2 g 200 mL/hr over 30 Minutes Intravenous  Once 10/03/22 1551 10/03/22 1655          Medications  amLODipine  10 mg Oral Daily   atorvastatin  20 mg Oral Daily   Chlorhexidine Gluconate Cloth  6 each Topical Daily   feeding supplement  237 mL Oral TID BM   insulin aspart  0-5 Units Subcutaneous QHS   insulin aspart  0-9 Units Subcutaneous TID WC   pantoprazole (PROTONIX) IV  40 mg Intravenous Q12H   potassium chloride  40 mEq Oral Once   sodium chloride flush  3 mL Intravenous Q12H      Subjective:   Mantaj Koglin was seen and examined today.  Coughing during the encounter, no chest pain, no fevers or chills.  Some shortness of breath noted.  No nausea or vomiting, abdominal pain.    Objective:   Vitals:   10/04/22 0800 10/04/22 1314 10/04/22  2056 10/05/22 0412  BP: (!) 141/81 135/82 113/76 (!) 146/84  Pulse:  98 79 76  Resp:  (!) 22 20 19   Temp: 99.7 F (37.6 C)  98.3 F (36.8 C) 98.8 F (37.1 C)  TempSrc: Oral  Oral Oral  SpO2:   100% 100%  Weight:      Height:        Intake/Output Summary (Last 24 hours) at 10/05/2022 1217 Last data filed at 10/05/2022 0700 Gross per 24 hour  Intake --  Output 900  ml  Net -900 ml     Wt Readings from Last 3 Encounters:  10/03/22 54.4 kg  09/28/22 54.5 kg  09/08/22 58.6 kg     Exam General: Alert and oriented x 3, NAD, ill-appearing, coughing Cardiovascular: S1 S2 auscultated,  RRR Respiratory: Decreased breath sound at the bases with mild scattered rhonchi Gastrointestinal: Soft, nontender, nondistended, + bowel sounds Ext: no pedal edema bilaterally Neuro: no new deficits Psych: Normal affect     Data Reviewed:  I have personally reviewed following labs    CBC Lab Results  Component Value Date   WBC 8.2 10/05/2022   RBC 3.41 (L) 10/05/2022   HGB 9.2 (L) 10/05/2022   HCT 30.5 (L) 10/05/2022   MCV 89.4 10/05/2022   MCH 27.0 10/05/2022   PLT 223 10/05/2022   MCHC 30.2 10/05/2022   RDW 21.7 (H) 10/05/2022   LYMPHSABS 0.2 (L) 10/03/2022   MONOABS 0.7 10/03/2022   EOSABS 0.0 10/03/2022   BASOSABS 0.0 10/03/2022     Last metabolic panel Lab Results  Component Value Date   NA 146 (H) 10/05/2022   K 3.1 (L) 10/05/2022   CL 111 10/05/2022   CO2 28 10/05/2022   BUN 24 (H) 10/05/2022   CREATININE 0.83 10/05/2022   GLUCOSE 123 (H) 10/05/2022   GFRNONAA >60 10/05/2022   GFRAA >90 11/22/2013   CALCIUM 8.8 (L) 10/05/2022   PHOS 2.7 10/05/2022   PROT 6.6 10/05/2022   ALBUMIN 1.9 (L) 10/05/2022   BILITOT 0.5 10/05/2022   ALKPHOS 48 10/05/2022   AST 21 10/05/2022   ALT 14 10/05/2022   ANIONGAP 7 10/05/2022    CBG (last 3)  Recent Labs    10/04/22 2059 10/05/22 0811 10/05/22 1127  GLUCAP 158* 118* 279*      Coagulation Profile: Recent Labs   Lab 10/03/22 1543  INR 1.1     Radiology Studies: I have personally reviewed the imaging studies  CT CHEST W CONTRAST  Result Date: 10/04/2022 CLINICAL DATA:  History of non-small-cell lung cancer and HCV cirrhosis status post segment 5 ablation in 2019 with new weakness, cough, and altered mental status * Tracking Code: BO * EXAM: CT CHEST WITH CONTRAST TECHNIQUE: Multidetector CT imaging of the chest was performed during intravenous contrast administration. RADIATION DOSE REDUCTION: This exam was performed according to the departmental dose-optimization program which includes automated exposure control, adjustment of the mA and/or kV according to patient size and/or use of iterative reconstruction technique. CONTRAST:  75mL OMNIPAQUE IOHEXOL 300 MG/ML  SOLN COMPARISON:  Chest radiograph dated 10/03/2022, CT chest dated 08/29/2022, MR abdomen dated 10/09/2020 FINDINGS: Cardiovascular: Right chest wall port terminates at the superior cavoatrial junction. Prior aortic valve replacement. Normal heart size. No significant pericardial fluid/thickening. Great vessels are normal in course and caliber. No central pulmonary emboli. Coronary artery calcifications and aortic atherosclerosis. Mediastinum/Nodes: Imaged thyroid gland without nodules meeting criteria for imaging follow-up by size. Normal esophagus. No pathologically enlarged axillary, supraclavicular, mediastinal, or hilar lymph nodes. Previously noted precarinal lymph node has decreased in size, measuring 7 mm (2:65), previously 11 mm, and subcarinal lymph node measures 9 mm (2:72), previously 13 mm. Lungs/Pleura: The central airways are patent. Upper lobe predominant centrilobular and paraseptal emphysema. Thick-walled cavitary lesion along the medial suprahilar right upper lobe is decreased in size, measuring 4.4 x 3.6 cm, previously 4.6 x 3.9 cm with interval increased conspicuity of a peripheral nodular component along the posteromedial aspect  measuring 1.3 x 0.8 cm (6:38). There is increased  adjacent right apical consolidation and interstitial thickening. Previously noted right lower lobe consolidation has largely resolved with a small amount of residual consolidation along the superior segment right lower lobe. Dependent ground-glass density in the right lower lobe is also seen. Left upper lobe peribronchovascular consolidation has largely resolved. No pneumothorax. Interval resolution of right pleural effusion. No pleural effusion. Upper abdomen: Partially imaged bilateral simple renal cysts. No specific follow-up imaging recommended. Subcentimeter hypoattenuating focus within the partially imaged inferior right hepatic lobe (7:55) correspond to suspected prior ablation site noted on MRI. Musculoskeletal: No acute or abnormal lytic or blastic osseous lesions. Median sternotomy wires are nondisplaced. IMPRESSION: 1. Decreased size of thick-walled cavitary lesion along the medial suprahilar right upper lobe with interval increased conspicuity of a peripheral nodular component. Increased adjacent right apical consolidation and interstitial thickening, likely infectious/inflammatory. Attention on follow-up. 2. Previously noted right lower lobe consolidation has largely resolved with a small amount of residual consolidation along the superior segment right lower lobe. Dependent ground-glass density in the right lower lobe is also seen. Left upper lobe peribronchovascular consolidation has largely resolved. 3. Previously noted precarinal and subcarinal lymph nodes have decreased in size, likely reactive. 4. Aortic Atherosclerosis (ICD10-I70.0) and Emphysema (ICD10-J43.9). Coronary artery calcifications. Assessment for potential risk factor modification, dietary therapy or pharmacologic therapy may be warranted, if clinically indicated. Electronically Signed   By: Agustin Cree M.D.   On: 10/04/2022 08:54   DG Chest Port 1 View  Result Date:  10/03/2022 CLINICAL DATA:  Sepsis. Weakness. Cough. EXAM: PORTABLE CHEST 1 VIEW COMPARISON:  Chest CT 08/29/2022 FINDINGS: Right chest port remains in place. Chronic right lung volume loss with right upper lobe pleuroparenchymal opacity. The cavitary lesion on CT is not well-defined by radiograph. There is improving right infrahilar opacity from prior CT. The previous left apical opacity is not definitively seen by radiograph. Prior median sternotomy with prosthetic aortic valve. The heart is normal in size. No significant pleural effusion or pneumothorax. IMPRESSION: 1. Chronic right lung volume loss with right upper lobe pleuroparenchymal opacity. The cavitary lesion on CT is not well-defined by radiograph. 2. Improving right infrahilar opacity from prior CT. 3. Previous left apical opacity is not definitively seen by radiograph. Electronically Signed   By: Narda Rutherford M.D.   On: 10/03/2022 16:28       Vicenta Olds M.D. [facility] 10/05/2022, 12:17 PM  Available via Epic secure chat 7am-7pm After 7 pm, please refer to night coverage provider listed on amion.

## 2022-10-05 NOTE — Evaluation (Signed)
Occupational Therapy Evaluation Patient Details Name: [name] MRN: [id number] DOB: 1940/09/10 Today's Date: 10/05/2022   History of Present Illness [name] is a 82 yr old male admitted to the hospital with cough, fever, & weakness. PMH: large squamous cell lung cancer diagnosed in January 2024 s/p chemoradiation, SIRS, acute epidymo-orchitis, aortic stenosis s/p valve replacemeent, DM II, HTN, LE DVT, lumbar laminectomy, cirrhosis, hepatitis, COPD, anemia   Clinical Impression   The pt reported being independent with ADLs and ambulation at his baseline, now he requires min guard to min assist for such tasks as lower body dressing, sit to stand, and ambulating a few feet to the bedside chair.  He is currently presenting slightly below his baseline level of functioning for self-care management, given deconditioning and slight generalized weakness. His goal is to get back to being functionally independent, and to be able to work in his garden again. He will continue to benefit from further OT services to maximize his independence with ADLs and to decrease the risk for restricted participation in meaningful activities.      Recommendations for follow up therapy are one component of a multi-disciplinary discharge planning process, led by the attending physician.  Recommendations may be updated based on patient status, additional functional criteria and insurance authorization.   Assistance Recommended at Discharge Intermittent Supervision/Assistance  Patient can return home with the following A little help with bathing/dressing/bathroom;Assist for transportation;Assistance with cooking/housework;Help with stairs or ramp for entrance    Functional Status Assessment  Patient has had a recent decline in their functional status and demonstrates the ability to make significant improvements in function in a reasonable and predictable amount of time.  Equipment Recommendations  None recommended by  OT       Precautions / Restrictions Precautions Precaution Comments: he has been using 2L O2 around the clock at home Restrictions Weight Bearing Restrictions: No      Mobility Bed Mobility Overal bed mobility: Needs Assistance Bed Mobility: Supine to Sit     Supine to sit: Supervision, HOB elevated          Transfers Overall transfer level: Needs assistance Equipment used: Rolling walker (2 wheels) Transfers: Sit to/from Stand Sit to Stand: Min guard, From elevated surface     Step pivot transfers: Min guard     General transfer comment: he took a couple steps to the bedside chair using a RW      Balance     Sitting balance-Leahy Scale: Good         Standing balance comment: min guard with RW             ADL either performed or assessed with clinical judgement   ADL Overall ADL's : Needs assistance/impaired Eating/Feeding: Independent;Sitting   Grooming: Sitting;Set up           Upper Body Dressing : Set up;Sitting Upper Body Dressing Details (indicate cue type and reason): simulated Lower Body Dressing: Min guard;Sit to/from stand                       Vision Baseline Vision/History: 1 Wears glasses Additional Comments: he incorrectly read the time depicted in the wall clock; he reported the time to be "10:07" when the time was actually 10:02            Pertinent Vitals/Pain Pain Assessment Pain Assessment: No/denies pain     Hand Dominance Left   Extremity/Trunk Assessment      Chronic L  shoulder AROM limitations, with AROM for shoulder flexion being <90 degrees; he reported having a torn rotator cuff. Otherwise, BUE and BLE AROM WFL. 4/5 BUE grip strength       Communication Communication Communication: No difficulties   Cognition Arousal/Alertness: Awake/alert Behavior During Therapy: WFL for tasks assessed/performed   Area of Impairment: Memory              General Comments: he appeared to have occasional  slight memory lapses, able to follow commands without difficulty                Home Living Family/patient expects to be discharged to:: Private residence Living Arrangements: Spouse/significant other Available Help at Discharge: Family Type of Home: House Home Access: Stairs to enter Secretary/administrator of Steps: 3 Entrance Stairs-Rails: Right;Left;Can reach both Home Layout: Two level;Able to live on main level with bedroom/bathroom     Bathroom Shower/Tub: Walk-in shower         Home Equipment: Shower seat - built in (Oxygen)          Prior Functioning/Environment Prior Level of Function : Independent/Modified Independent             Mobility Comments:  (he ambulated without an assistive device) ADLs Comments:  (He was independent with ADLs & his spouse managed the household cooking & cleaning; he has not driven since ~February of this year.)        OT Problem List: Decreased strength;Decreased activity tolerance;Impaired balance (sitting and/or standing);Decreased knowledge of use of DME or AE      OT Treatment/Interventions: Self-care/ADL training;Therapeutic exercise;Energy conservation;DME and/or AE instruction;Patient/family education;Balance training    OT Goals(Current goals can be found in the care plan section) Acute Rehab OT Goals Patient Stated Goal: to get back to doing "everything" that he was doing, including working in his yard/garden OT Goal Formulation: With patient Time For Goal Achievement: 10/19/22 Potential to Achieve Goals: Good ADL Goals Pt Will Perform Grooming: with modified independence;standing Pt Will Perform Lower Body Dressing: with modified independence;sit to/from stand Pt Will Transfer to Toilet: with modified independence;ambulating Pt Will Perform Toileting - Clothing Manipulation and hygiene: with modified independence;sit to/from stand  OT Frequency: Min 1X/week       AM-PAC OT "6 Clicks" Daily Activity      Outcome Measure Help from another person eating meals?: None Help from another person taking care of personal grooming?: None Help from another person toileting, which includes using toliet, bedpan, or urinal?: A Little Help from another person bathing (including washing, rinsing, drying)?: A Little Help from another person to put on and taking off regular upper body clothing?: None Help from another person to put on and taking off regular lower body clothing?: A Little 6 Click Score: 21   End of Session Equipment Utilized During Treatment: Gait belt;Rolling walker (2 wheels);Oxygen Nurse Communication: Mobility status  Activity Tolerance: Patient tolerated treatment well Patient left: in chair;with call bell/phone within reach;with chair alarm set;with family/visitor present  OT Visit Diagnosis: Unsteadiness on feet (R26.81);Muscle weakness (generalized) (M62.81)                Time: 8182-9937 OT Time Calculation (min): 17 min Charges:  OT General Charges $OT Visit: 1 Visit OT Evaluation $OT Eval Low Complexity: 1 Low    Keilon Ressel L Amariss Detamore, OTR/L 10/05/2022, 11:22 AM

## 2022-10-05 NOTE — Progress Notes (Addendum)
Progress Note  Primary GI: Dr. Arlyce Dice  LOS: 1 day   Chief Complaint:Dysphagia   Subjective  Patient reports continued dysphagia, though currently handling diet. Requesting mouthwash. Denies abdominal pain, nausea, and vomiting.  No family was present at the time of my evaluation.   Objective   Vital signs in last 24 hours: Temp:  [98.3 F (36.8 C)-98.8 F (37.1 C)] 98.8 F (37.1 C) (05/29 0412) Pulse Rate:  [76-98] 76 (05/29 0412) Resp:  [19-22] 19 (05/29 0412) BP: (113-146)/(76-84) 146/84 (05/29 0412) SpO2:  [100 %] 100 % (05/29 0412) Last BM Date : 10/02/22 Last BM recorded by nurses in past 5 days No data recorded  General:   male in no acute distress  Heart:  Regular rate and rhythm; no murmurs Pulm: Clear anteriorly; no wheezing Abdomen: soft, nondistended, normal bowel sounds in all quadrants. Nontender without guarding. No organomegaly appreciated. Extremities:  No edema Neurologic:  Alert and  oriented x4;  No focal deficits.  Psych:  Cooperative. Normal mood and affect.  Intake/Output from previous day: 05/28 0701 - 05/29 0700 In: 10 [I.V.:10] Out: 1200 [Urine:1200] Intake/Output this shift: No intake/output data recorded.  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 RADIATION DOSE REDUCTION: This exam was performed according to the departmental dose-optimization program which includes automated exposure control, adjustment of the mA and/or kV according to patient size and/or use of iterative reconstruction technique. CONTRAST:  75mL OMNIPAQUE IOHEXOL 300 MG/ML  SOLN COMPARISON:  Chest radiograph dated 10/03/2022, CT chest  dated 08/29/2022, MR abdomen dated 10/09/2020 FINDINGS: Cardiovascular: Right chest wall port terminates at the superior cavoatrial junction. Prior aortic valve replacement. Normal heart size. No significant pericardial fluid/thickening. Great vessels are normal in course and caliber. No central pulmonary emboli. Coronary artery calcifications and aortic atherosclerosis. Mediastinum/Nodes: Imaged thyroid gland without nodules meeting criteria for imaging follow-up by size. Normal esophagus. No pathologically enlarged axillary, supraclavicular, mediastinal, or hilar lymph nodes. Previously noted precarinal lymph node has decreased in size, measuring 7 mm (2:65), previously 11 mm, and subcarinal lymph node measures 9 mm (2:72), previously 13 mm. Lungs/Pleura: The central airways are patent. Upper lobe predominant centrilobular and paraseptal emphysema. Thick-walled cavitary lesion along the medial suprahilar right upper lobe is decreased in size, measuring 4.4 x 3.6 cm, previously 4.6 x 3.9 cm with interval increased conspicuity of a peripheral nodular component along the posteromedial aspect measuring 1.3 x 0.8 cm (6:38). There is increased adjacent right apical consolidation and interstitial thickening. Previously noted right lower lobe consolidation has largely resolved with a small amount of residual consolidation along the superior segment right lower lobe. Dependent ground-glass density in the right lower lobe is also seen. Left upper lobe peribronchovascular consolidation has largely resolved. No pneumothorax. Interval resolution of right pleural effusion. No pleural effusion. Upper abdomen: Partially imaged bilateral simple renal cysts. No specific follow-up imaging recommended. Subcentimeter hypoattenuating focus within the partially imaged inferior right hepatic lobe (7:55) correspond to suspected prior ablation site noted on MRI. Musculoskeletal: No acute or abnormal lytic or blastic osseous lesions. Median  sternotomy wires are nondisplaced. IMPRESSION: 1. Decreased size of thick-walled cavitary lesion along the medial suprahilar right upper lobe with interval increased conspicuity of  a peripheral nodular component. Increased adjacent right apical consolidation and interstitial thickening, likely infectious/inflammatory. Attention on follow-up. 2. Previously noted right lower lobe consolidation has largely resolved with a small amount of residual consolidation along the superior segment right lower lobe. Dependent ground-glass density in the right lower lobe is also seen. Left upper lobe peribronchovascular consolidation has largely resolved. 3. Previously noted precarinal and subcarinal lymph nodes have decreased in size, likely reactive. 4. Aortic Atherosclerosis (ICD10-I70.0) and Emphysema (ICD10-J43.9). Coronary artery calcifications. Assessment for potential risk factor modification, dietary therapy or pharmacologic therapy may be warranted, if clinically indicated. Electronically Signed   By: Agustin Cree M.D.   On: 10/04/2022 08:54   DG Chest Port 1 View  Result Date: 10/03/2022 CLINICAL DATA:  Sepsis. Weakness. Cough. EXAM: PORTABLE CHEST 1 VIEW COMPARISON:  Chest CT 08/29/2022 FINDINGS: Right chest port remains in place. Chronic right lung volume loss with right upper lobe pleuroparenchymal opacity. The cavitary lesion on CT is not well-defined by radiograph. There is improving right infrahilar opacity from prior CT. The previous left apical opacity is not definitively seen by radiograph. Prior median sternotomy with prosthetic aortic valve. The heart is normal in size. No significant pleural effusion or pneumothorax. IMPRESSION: 1. Chronic right lung volume loss with right upper lobe pleuroparenchymal opacity. The cavitary lesion on CT is not well-defined by radiograph. 2. Improving right infrahilar opacity from prior CT. 3. Previous left apical opacity is not definitively seen by radiograph. Electronically  Signed   By: Narda Rutherford M.D.   On: 10/03/2022 16:28    Lab Results: Recent Labs    10/03/22 1543 10/04/22 0500 10/05/22 0500  WBC 11.0* 10.8* 8.2  HGB 10.1* 10.2* 9.2*  HCT 33.0* 32.6* 30.5*  PLT 265 252 223   BMET Recent Labs    10/03/22 1543 10/04/22 0500 10/05/22 0500  NA 145 146* 146*  K 3.9 2.9* 3.1*  CL 108 111 111  CO2 25 29 28   GLUCOSE 599* 74 123*  BUN 35* 23 24*  CREATININE 1.22 0.79 0.83  CALCIUM 9.3 9.1 8.8*   LFT Recent Labs    10/05/22 0500  PROT 6.6  ALBUMIN 1.9*  AST 21  ALT 14  ALKPHOS 48  BILITOT 0.5   PT/INR Recent Labs    10/03/22 1543  LABPROT 14.8  INR 1.1     Scheduled Meds:  amLODipine  10 mg Oral Daily   atorvastatin  20 mg Oral Daily   Chlorhexidine Gluconate Cloth  6 each Topical Daily   feeding supplement  237 mL Oral TID BM   insulin aspart  0-5 Units Subcutaneous QHS   insulin aspart  0-9 Units Subcutaneous TID WC   pantoprazole (PROTONIX) IV  40 mg Intravenous Q12H   potassium chloride  40 mEq Oral Once   sodium chloride flush  3 mL Intravenous Q12H   Continuous Infusions:  ceFEPime (MAXIPIME) IV 2 g (10/05/22 0514)   dextrose        Patient profile:   82 year old male with 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  Dysphagia ongoing since completing radiation.   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 Mild-moderate narrowed appearance of upper thoracic esophagus.  13 mm barium tablet did not pass on MBS 4/24 so tablet was not attempted during this  study. Moderate esophageal dysmotility with tertiary contractions.   Chronic anemia - hgb 9.2, stable   SIRS - on broad spectrum antibiotics with vancomycin, cefepime, and  Flagyl. - wbc 8.2, improving   SCC of lung   DMII COPD - on chronic O2 via [place]   Plan:   Plan for EGD tomorrow. I thoroughly discussed the procedures to include nature, alternatives, benefits, and risks including but not limited to bleeding, perforation, infection, anesthesia/cardiac and pulmonary complications. Patient provides understanding and gave verbal consent to proceed. Continue Protonix IV 40mg  BID Continue diet NPO at midnight   Diandra Cimini M Casidy [place]  10/05/2022, 9:00 AM

## 2022-10-05 NOTE — Progress Notes (Signed)
Physical Therapy Treatment Patient Details Name: [name] MRN: [id number] DOB: 1940-07-29 Today's Date: 10/05/2022   History of Present Illness 82 yo male presents to therapy s/p admission to hospital on 10/03/2022 due to fever, productive cough and generalized weakness. Pt has significant hx of squamous cell cancer of R lung, underwent chemoradiation completing course 3/21 with partial response. Pt recently seen by pulmonologist and on prednisone taper and 2 L/min continuous supplemental O2. Swallow study indicates evidence of small volume aspiration and suspicious for upper esophageal stricture. Upon presentation to ED blood glucose levels 565 mg/dL and sepsis work up due to possible PNA and started ABX. 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    PT Comments    Pt up in recilner, agreeable to therapy, family x2 at bedside. Pt performs seated exercises without pain complaints, good muscle activation noted. Educated and demo'd rollator use for pt and family. Pt amb 100 ft with rollator,  hesitation with turns and needing cues for body positioning to AD to improve safety. Pt needing min A for initial power to stand from recliner and min G for following reps, verbal cues for positioning and BUE and BLE activation to assist in powering up. Pt denies SOB, dizziness, pain during session.   Recommendations for follow up therapy are one component of a multi-disciplinary discharge planning process, led by the attending physician.  Recommendations may be updated based on patient status, additional functional criteria and insurance authorization.  Follow Up Recommendations       Assistance Recommended at Discharge Intermittent Supervision/Assistance  Patient can return home with the following A little help with walking and/or transfers;A  little help with bathing/dressing/bathroom;Assistance with cooking/housework;Assist for transportation;Help with stairs or ramp for entrance   Equipment Recommendations  Other (comment) (TBD RW vs Rollator)    Recommendations for Other Services       Precautions / Restrictions Precautions Precautions: Fall Restrictions Weight Bearing Restrictions: No     Mobility  Bed Mobility               General bed mobility comments: in recliner upon arrival    Transfers Overall transfer level: Needs assistance Equipment used: Rolling walker (2 wheels), Rollator (4 wheels) Transfers: Sit to/from Stand Sit to Stand: Min assist, Min guard           General transfer comment: min A for initial power up to stand, min G for following reps, cues to scoot out to EOB, BLE engagement and BUE assist to power up and for controlled sitting; education on rollator brake management    Ambulation/Gait Ambulation/Gait assistance: Min guard Gait Distance (Feet): 100 Feet Assistive device: Rollator (4 wheels) Gait Pattern/deviations: Step-through pattern, Decreased stride length Gait velocity: decreased     General Gait Details: step through gait pattern, no overt LOB, increased time and hesitation with turns, verbal cues for stepping closer to rollator   Stairs             Wheelchair Mobility    Modified Rankin (Stroke Patients Only)       Balance Overall balance assessment: Mild deficits observed, not formally tested         Standing balance support: Reliant on assistive device for balance, During functional activity, Bilateral upper extremity supported Standing balance-Leahy Scale: Poor  Cognition Arousal/Alertness: Awake/alert Behavior During Therapy: WFL for tasks assessed/performed Overall Cognitive Status: Impaired/Different from baseline                                 General Comments: spouse correcting pt  regarding DME and PLOF, follows commands consistently        Exercises General Exercises - Lower Extremity Long Arc Quad: AROM, Strengthening, Both, 10 reps, Seated Hip Flexion/Marching: AROM, Strengthening, Both, 10 reps, Standing (with RW) Other Exercises Other Exercises: STS x4 reps wtih RW    General Comments        Pertinent Vitals/Pain Pain Assessment Pain Assessment: No/denies pain    Home Living                          Prior Function            PT Goals (current goals can now be found in the care plan section) Acute Rehab PT Goals Patient Stated Goal: go home PT Goal Formulation: With patient Time For Goal Achievement: 10/18/22 Potential to Achieve Goals: Good Progress towards PT goals: Progressing toward goals    Frequency    Min 1X/week      PT Plan Current plan remains appropriate    Co-evaluation              AM-PAC PT "6 Clicks" Mobility   Outcome Measure  Help needed turning from your back to your side while in a flat bed without using bedrails?: A Little Help needed moving from lying on your back to sitting on the side of a flat bed without using bedrails?: A Little Help needed moving to and from a bed to a chair (including a wheelchair)?: A Little Help needed standing up from a chair using your arms (e.g., wheelchair or bedside chair)?: A Little Help needed to walk in hospital room?: A Little Help needed climbing 3-5 steps with a railing? : A Lot 6 Click Score: 17    End of Session Equipment Utilized During Treatment: Gait belt;Oxygen Activity Tolerance: Patient tolerated treatment well Patient left: in chair;with call bell/phone within reach;with chair alarm set;with family/visitor present Nurse Communication: Mobility status PT Visit Diagnosis: Unsteadiness on feet (R26.81);Muscle weakness (generalized) (M62.81)     Time: 1610-9604 PT Time Calculation (min) (ACUTE ONLY): 29 min  Charges:  $Gait Training: 8-22  mins $Therapeutic Exercise: 8-22 mins                      Tori Jenevieve Kirschbaum PT, DPT 10/05/22, 2:03 PM

## 2022-10-05 NOTE — Inpatient Diabetes Management (Addendum)
Inpatient Diabetes Program Recommendations  AACE/ADA: New Consensus Statement on Inpatient Glycemic Control (2015)  Target Ranges:  Prepandial:   less than 140 mg/dL      Peak postprandial:   less than 180 mg/dL (1-2 hours)      Critically ill patients:  140 - 180 mg/dL   Lab Results  Component Value Date   GLUCAP 118 (H) 10/05/2022   HGBA1C 11.4 (H) 10/04/2022    Review of Glycemic Control  Latest Reference Range & Units 10/04/22 07:48 10/04/22 11:40 10/04/22 16:54 10/04/22 20:59 10/05/22 08:11  Glucose-Capillary 70 - 99 mg/dL 78 161 (H) 096 (H) 045 (H) 118 (H)   Diabetes history: DM 2 Outpatient Diabetes medications:  Metformin 1000 mg bid Prednisone taper Current orders for Inpatient glycemic control:  Novolog 0-9 units tid with meals and HS  Inpatient Diabetes Program Recommendations:   Note that A1C is 11.4% (A1C in January was 6.8%), possibly due to steroids?? Will need close monitoring of blood sugars and possibly insulin? Will follow.  Addendum:  Spoke with patient and family regarding elevated blood sugars on admit with steroids.  Wife states that blood sugars are usually pretty good but that they went up with Prednisone and he was craving juice for his dry mouth.  Explained that blood sugars are improving in the hospital, however encouraged close monitoring and that when on steroids, he may need a rapid acting insulin with meals for correction of elevated blood sugars.  I did go ahead and show them how to use insulin pen in case it is needed in the future. Reviewed all steps if insulin pen including attachment of needle, 2-unit air shot, dialing up dose, giving injection, removing needle, disposal of sharps, storage of unused insulin, disposal of insulin etc.  We also reviewed signs, symptoms and treatment of hypoglycemia.  Will order patient/family a Living Well with DM booklet as well and attach instructions for use of insulin pen in case it is needed in the future.     Thanks,  Beryl Meager, RN, BC-ADM Inpatient Diabetes Coordinator Pager [phone]  (8a-5p)

## 2022-10-05 NOTE — Progress Notes (Signed)
PHARMACY - PHYSICIAN COMMUNICATION CRITICAL VALUE ALERT - BLOOD CULTURE IDENTIFICATION (BCID)  [name] is an 82 y.o. male with lunch cancer who presented to [facility] on 10/03/2022 with a chief complaint of cough and generalized weakness.  She was started on vancomycin, cefepime and flagyl on admission for suspected sepsis secondary to PNA.  MRSA PCR came back negative with vancomycin d/ced on 5/28.  One of four blood culture bottles collected on 5/27 resulted back on 5/29 with GPC (BCID= staph epi, mecA/C+).  Name of physician (or Provider) Contacted: [name]  Current antibiotics: cefepime and flagyl  Changes to prescribed antibiotics recommended:  - Per [name], she will consult ID team for recommendation.  Results for orders placed or performed during the hospital encounter of 10/03/22  Blood Culture ID Panel (Reflexed) (Collected: 10/03/2022  3:35 PM)  Result Value Ref Range   Enterococcus faecalis NOT DETECTED NOT DETECTED   Enterococcus Faecium NOT DETECTED NOT DETECTED   Listeria monocytogenes NOT DETECTED NOT DETECTED   Staphylococcus species DETECTED (A) NOT DETECTED   Staphylococcus aureus (BCID) NOT DETECTED NOT DETECTED   Staphylococcus epidermidis DETECTED (A) NOT DETECTED   Staphylococcus lugdunensis NOT DETECTED NOT DETECTED   Streptococcus species NOT DETECTED NOT DETECTED   Streptococcus agalactiae NOT DETECTED NOT DETECTED   Streptococcus pneumoniae NOT DETECTED NOT DETECTED   Streptococcus pyogenes NOT DETECTED NOT DETECTED   A.calcoaceticus-baumannii NOT DETECTED NOT DETECTED   Bacteroides fragilis NOT DETECTED NOT DETECTED   Enterobacterales NOT DETECTED NOT DETECTED   Enterobacter cloacae complex NOT DETECTED NOT DETECTED   Escherichia coli NOT DETECTED NOT DETECTED   Klebsiella aerogenes NOT DETECTED NOT DETECTED   Klebsiella oxytoca NOT DETECTED NOT DETECTED   Klebsiella pneumoniae NOT DETECTED NOT DETECTED   Proteus species NOT DETECTED NOT DETECTED    Salmonella species NOT DETECTED NOT DETECTED   Serratia marcescens NOT DETECTED NOT DETECTED   Haemophilus influenzae NOT DETECTED NOT DETECTED   Neisseria meningitidis NOT DETECTED NOT DETECTED   Pseudomonas aeruginosa NOT DETECTED NOT DETECTED   Stenotrophomonas maltophilia NOT DETECTED NOT DETECTED   Candida albicans NOT DETECTED NOT DETECTED   Candida auris NOT DETECTED NOT DETECTED   Candida glabrata NOT DETECTED NOT DETECTED   Candida krusei NOT DETECTED NOT DETECTED   Candida parapsilosis NOT DETECTED NOT DETECTED   Candida tropicalis NOT DETECTED NOT DETECTED   Cryptococcus neoformans/gattii NOT DETECTED NOT DETECTED   Methicillin resistance mecA/C DETECTED (A) NOT DETECTED    [name] P 10/05/2022  7:12 AM

## 2022-10-06 ENCOUNTER — Inpatient Hospital Stay (HOSPITAL_COMMUNITY): Payer: Medicare Other

## 2022-10-06 ENCOUNTER — Inpatient Hospital Stay: Payer: Medicare Other | Admitting: Internal Medicine

## 2022-10-06 ENCOUNTER — Encounter (HOSPITAL_COMMUNITY)
Admission: EM | Disposition: A | Discharge status: Hospice | Payer: Self-pay | Source: Home / Self Care | Attending: Internal Medicine

## 2022-10-06 DIAGNOSIS — Z7189 Other specified counseling: Secondary | ICD-10-CM

## 2022-10-06 DIAGNOSIS — E43 Unspecified severe protein-calorie malnutrition: Secondary | ICD-10-CM

## 2022-10-06 DIAGNOSIS — R609 Edema, unspecified: Secondary | ICD-10-CM

## 2022-10-06 DIAGNOSIS — R1310 Dysphagia, unspecified: Secondary | ICD-10-CM | POA: Diagnosis not present

## 2022-10-06 DIAGNOSIS — Z66 Do not resuscitate: Secondary | ICD-10-CM

## 2022-10-06 DIAGNOSIS — C3490 Malignant neoplasm of unspecified part of unspecified bronchus or lung: Secondary | ICD-10-CM | POA: Diagnosis not present

## 2022-10-06 DIAGNOSIS — E1165 Type 2 diabetes mellitus with hyperglycemia: Secondary | ICD-10-CM | POA: Diagnosis not present

## 2022-10-06 DIAGNOSIS — Z515 Encounter for palliative care: Secondary | ICD-10-CM | POA: Diagnosis not present

## 2022-10-06 DIAGNOSIS — A419 Sepsis, unspecified organism: Secondary | ICD-10-CM | POA: Diagnosis not present

## 2022-10-06 DIAGNOSIS — D500 Iron deficiency anemia secondary to blood loss (chronic): Secondary | ICD-10-CM | POA: Diagnosis not present

## 2022-10-06 DIAGNOSIS — R6510 Systemic inflammatory response syndrome (SIRS) of non-infectious origin without acute organ dysfunction: Secondary | ICD-10-CM | POA: Diagnosis not present

## 2022-10-06 LAB — GLUCOSE, CAPILLARY
Glucose-Capillary: 190 mg/dL — ABNORMAL HIGH (ref 70–99)
Glucose-Capillary: 132 mg/dL — ABNORMAL HIGH (ref 70–99)
Glucose-Capillary: 216 mg/dL — ABNORMAL HIGH (ref 70–99)
Glucose-Capillary: 161 mg/dL — ABNORMAL HIGH (ref 70–99)
Glucose-Capillary: 199 mg/dL — ABNORMAL HIGH (ref 70–99)
Glucose-Capillary: 244 mg/dL — ABNORMAL HIGH (ref 70–99)

## 2022-10-06 LAB — MAGNESIUM
Magnesium: 1.6 mg/dL — ABNORMAL LOW (ref 1.7–2.4)
Magnesium: 2.9 mg/dL — ABNORMAL HIGH (ref 1.7–2.4)

## 2022-10-06 LAB — CBC
HCT: 29.3 % — ABNORMAL LOW (ref 39.0–52.0)
Hemoglobin: 8.7 g/dL — ABNORMAL LOW (ref 13.0–17.0)
MCH: 26.6 pg (ref 26.0–34.0)
MCHC: 29.7 g/dL — ABNORMAL LOW (ref 30.0–36.0)
MCV: 89.6 fL (ref 80.0–100.0)
Platelets: 170 10*3/uL (ref 150–400)
RBC: 3.27 MIL/uL — ABNORMAL LOW (ref 4.22–5.81)
RDW: 21.5 % — ABNORMAL HIGH (ref 11.5–15.5)
WBC: 6.5 10*3/uL (ref 4.0–10.5)
nRBC: 0 % (ref 0.0–0.2)

## 2022-10-06 LAB — POTASSIUM: Potassium: 4.2 mmol/L (ref 3.5–5.1)

## 2022-10-06 LAB — BASIC METABOLIC PANEL
Anion gap: 7 (ref 5–15)
BUN: 22 mg/dL (ref 8–23)
CO2: 26 mmol/L (ref 22–32)
Calcium: 8.2 mg/dL — ABNORMAL LOW (ref 8.9–10.3)
Chloride: 109 mmol/L (ref 98–111)
Creatinine, Ser: 0.93 mg/dL (ref 0.61–1.24)
GFR, Estimated: >60 mL/min (ref >60)
Glucose, Bld: 160 mg/dL — ABNORMAL HIGH (ref 70–99)
Potassium: 2.9 mmol/L — ABNORMAL LOW (ref 3.5–5.1)
Sodium: 142 mmol/L (ref 135–145)

## 2022-10-06 LAB — CULTURE, BLOOD (ROUTINE X 2)

## 2022-10-06 LAB — BRAIN NATRIURETIC PEPTIDE: B Natriuretic Peptide: 297.1 pg/mL — ABNORMAL HIGH (ref 0.0–100.0)

## 2022-10-06 SURGERY — EGD (ESOPHAGOGASTRODUODENOSCOPY)
Anesthesia: Monitor Anesthesia Care

## 2022-10-06 MED ORDER — POTASSIUM CHLORIDE 10 MEQ/100ML IV SOLN
10.0000 meq | INTRAVENOUS | Status: AC
  Administered 2022-10-06 (×5): 10 meq via INTRAVENOUS
  Filled 2022-10-06 (×5): qty 100

## 2022-10-06 MED ORDER — METHYLPREDNISOLONE SODIUM SUCC 40 MG IJ SOLR
40.0000 mg | Freq: Two times a day (BID) | INTRAMUSCULAR | Status: DC
  Administered 2022-10-06 – 2022-10-08 (×5): 40 mg via INTRAVENOUS
  Filled 2022-10-06 (×5): qty 1

## 2022-10-06 MED ORDER — FUROSEMIDE 10 MG/ML IJ SOLN
40.0000 mg | Freq: Every day | INTRAMUSCULAR | Status: DC
  Administered 2022-10-07 – 2022-10-08 (×2): 40 mg via INTRAVENOUS
  Filled 2022-10-06 (×2): qty 4

## 2022-10-06 MED ORDER — FUROSEMIDE 10 MG/ML IJ SOLN
20.0000 mg | Freq: Once | INTRAMUSCULAR | Status: AC
  Administered 2022-10-06: 20 mg via INTRAVENOUS
  Filled 2022-10-06: qty 2

## 2022-10-06 MED ORDER — HYDROCOD POLI-CHLORPHE POLI ER 10-8 MG/5ML PO SUER
5.0000 mL | Freq: Two times a day (BID) | ORAL | Status: DC | PRN
  Administered 2022-10-06: 5 mL via ORAL
  Filled 2022-10-06: qty 5

## 2022-10-06 MED ORDER — VANCOMYCIN HCL 750 MG/150ML IV SOLN
750.0000 mg | INTRAVENOUS | Status: DC
  Administered 2022-10-07: 750 mg via INTRAVENOUS
  Filled 2022-10-06: qty 150

## 2022-10-06 MED ORDER — BUDESONIDE 0.25 MG/2ML IN SUSP
0.2500 mg | Freq: Two times a day (BID) | RESPIRATORY_TRACT | Status: DC
  Administered 2022-10-06 – 2022-10-08 (×5): 0.25 mg via RESPIRATORY_TRACT
  Filled 2022-10-06 (×6): qty 2

## 2022-10-06 MED ORDER — MORPHINE SULFATE (PF) 2 MG/ML IV SOLN
1.0000 mg | INTRAVENOUS | Status: DC | PRN
  Administered 2022-10-06: 1 mg via INTRAVENOUS
  Filled 2022-10-06: qty 1

## 2022-10-06 MED ORDER — ARFORMOTEROL TARTRATE 15 MCG/2ML IN NEBU
15.0000 ug | INHALATION_SOLUTION | Freq: Two times a day (BID) | RESPIRATORY_TRACT | Status: DC
  Administered 2022-10-06 – 2022-10-08 (×5): 15 ug via RESPIRATORY_TRACT
  Filled 2022-10-06 (×5): qty 2

## 2022-10-06 MED ORDER — MAGNESIUM SULFATE 4 GM/100ML IV SOLN
4.0000 g | Freq: Once | INTRAVENOUS | Status: AC
  Administered 2022-10-06: 4 g via INTRAVENOUS
  Filled 2022-10-06: qty 100

## 2022-10-06 MED ORDER — ENOXAPARIN SODIUM 40 MG/0.4ML IJ SOSY
40.0000 mg | PREFILLED_SYRINGE | INTRAMUSCULAR | Status: DC
  Administered 2022-10-06 – 2022-10-07 (×2): 40 mg via SUBCUTANEOUS
  Filled 2022-10-06 (×2): qty 0.4

## 2022-10-06 MED ORDER — VANCOMYCIN HCL IN DEXTROSE 1-5 GM/200ML-% IV SOLN
1000.0000 mg | INTRAVENOUS | Status: AC
  Administered 2022-10-06: 1000 mg via INTRAVENOUS
  Filled 2022-10-06: qty 200

## 2022-10-06 MED ORDER — IPRATROPIUM-ALBUTEROL 0.5-2.5 (3) MG/3ML IN SOLN
3.0000 mL | Freq: Four times a day (QID) | RESPIRATORY_TRACT | Status: DC
  Administered 2022-10-06 – 2022-10-08 (×9): 3 mL via RESPIRATORY_TRACT
  Filled 2022-10-06 (×8): qty 3

## 2022-10-06 MED ORDER — MAGNESIUM SULFATE 50 % IJ SOLN: 4.0000 g | Freq: Once | INTRAVENOUS | Status: DC

## 2022-10-06 MED ORDER — INSULIN ASPART 100 UNIT/ML IJ SOLN
0.0000 [IU] | INTRAMUSCULAR | Status: DC
  Administered 2022-10-06: 3 [IU] via SUBCUTANEOUS
  Administered 2022-10-06 (×2): 5 [IU] via SUBCUTANEOUS
  Administered 2022-10-07: 2 [IU] via SUBCUTANEOUS
  Administered 2022-10-07: 8 [IU] via SUBCUTANEOUS
  Administered 2022-10-07: 3 [IU] via SUBCUTANEOUS
  Administered 2022-10-07: 5 [IU] via SUBCUTANEOUS

## 2022-10-06 MED ORDER — POTASSIUM CHLORIDE 20 MEQ PO PACK: 40.0000 meq | PACK | Freq: Every day | ORAL | Status: DC

## 2022-10-06 MED ORDER — IPRATROPIUM BROMIDE 0.02 % IN SOLN: 0.5000 mg | RESPIRATORY_TRACT | Status: DC | PRN

## 2022-10-06 MED ORDER — FUROSEMIDE 10 MG/ML IJ SOLN: 40.0000 mg | Freq: Every day | INTRAMUSCULAR | Status: DC

## 2022-10-06 MED ORDER — HYDROCOD POLI-CHLORPHE POLI ER 10-8 MG/5ML PO SUER
5.0000 mL | Freq: Two times a day (BID) | ORAL | Status: DC
  Administered 2022-10-07 – 2022-10-08 (×3): 5 mL via ORAL
  Filled 2022-10-06 (×3): qty 5

## 2022-10-06 MED ORDER — IPRATROPIUM-ALBUTEROL 0.5-2.5 (3) MG/3ML IN SOLN
3.0000 mL | RESPIRATORY_TRACT | Status: DC | PRN
  Administered 2022-10-06: 3 mL via RESPIRATORY_TRACT
  Filled 2022-10-06: qty 3

## 2022-10-06 MED ORDER — SODIUM CHLORIDE 3 % IN NEBU
4.0000 mL | INHALATION_SOLUTION | Freq: Two times a day (BID) | RESPIRATORY_TRACT | Status: DC
  Administered 2022-10-06 – 2022-10-08 (×5): 4 mL via RESPIRATORY_TRACT
  Filled 2022-10-06 (×6): qty 4

## 2022-10-06 MED ORDER — PIPERACILLIN-TAZOBACTAM 3.375 G IVPB
3.3750 g | Freq: Three times a day (TID) | INTRAVENOUS | Status: DC
  Administered 2022-10-06 – 2022-10-08 (×6): 3.375 g via INTRAVENOUS
  Filled 2022-10-06 (×6): qty 50

## 2022-10-06 MED ORDER — GUAIFENESIN ER 600 MG PO TB12
1200.0000 mg | ORAL_TABLET | Freq: Two times a day (BID) | ORAL | Status: DC
  Administered 2022-10-07 – 2022-10-08 (×3): 1200 mg via ORAL
  Filled 2022-10-06 (×5): qty 2

## 2022-10-06 MED ORDER — DEXMEDETOMIDINE HCL IN NACL 200 MCG/50ML IV SOLN: 0.0000 ug/kg/h | INTRAVENOUS | Status: DC

## 2022-10-06 NOTE — Progress Notes (Signed)
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 This morning a rapid response was called for respiratory distress in which patient was coughing and desatted to 70s.  Patient had a fever of 103.1 orally and oxygen was titrated up to high flow 10 L/min with 92% saturation.  Patient was subsequently moved to the ICU. Receiving nebulizing treatment during my evaluation today  Patient with family at bedside, wife. Provided some of the history.     Objective   Vital signs in last 24 hours: Temp:  [97.4 F (36.3 C)-103.1 F (39.5 C)] 97.4 F (36.3 C) (05/30 0530) Pulse Rate:  [67-115] 115 (05/30 0900) Resp:  [20-37] 37 (05/30 0900) BP: (112-151)/(52-91) 112/52 (05/30 0900) SpO2:  [75 %-99 %] 75 % (05/30 0900) FiO2 (%):  [100 %] 100 % (05/30 0834) Last BM Date : 10/02/22 Last BM recorded by nurses in past 5 days No data recorded  General:   male with nebulizing treatment Heart:  tachycardiac Pulm: tachypneic, wheezing, in mild to moderate distress Abdomen: soft, nondistended, normal bowel sounds in all quadrants. Nontender without guarding. No organomegaly appreciated. Extremities:  No edema Neurologic:  Alert and  oriented x4;  No focal deficits.  Psych:  Cooperative. Normal mood and affect.  Intake/Output from previous day: 05/29 0701 - 05/30 0700 In: 100 [P.O.:100] Out: 1050 [Urine:1050] Intake/Output this shift: Total I/O In: -  Out: 150 [Urine:150]  Studies/Results: DG Chest Port 1 View  Result Date: 10/06/2022 CLINICAL DATA:  Acute respiratory distress. EXAM: PORTABLE CHEST 1 VIEW COMPARISON:  Chest x-ray May 28 214. FINDINGS: In comparison to May 27, 24 chest x-ray, increased bilateral interstitial opacities and cavitary consolidation in the right lung apex. Findings better characterized on recent CT of the chest Oct 04, 2022.  cardiomediastinal silhouette is unchanged. Similar position of right IJ Port-A-Cath. Bilateral shoulder degenerative change. Median sternotomy. Cardiac valve replacement. IMPRESSION: In comparison to May 27, 24 chest x-ray, increased bilateral interstitial opacities and cavitary consolidation in the right lung apex. Findings better characterized on recent CT of the chest Oct 04, 2022. This could represent worsening infection and/or edema. Electronically Signed   By: Feliberto Harts M.D.   On: 10/06/2022 08:16    Lab Results: Recent Labs    10/04/22 0500 10/05/22 0500 10/06/22 0618  WBC 10.8* 8.2 6.5  HGB 10.2* 9.2* 8.7*  HCT 32.6* 30.5* 29.3*  PLT 252 223 170   BMET Recent Labs    10/04/22 0500 10/05/22 0500 10/05/22 1750 10/06/22 0618  NA 146* 146*  --  142  K 2.9* 3.1*  --  2.9*  CL 111 111  --  109  CO2 29 28  --  26  GLUCOSE 74 123* 444* 160*  BUN 23 24*  --  22  CREATININE 0.79 0.83  --  0.93  CALCIUM 9.1 8.8*  --  8.2*   LFT Recent Labs    10/05/22 0500  PROT 6.6  ALBUMIN 1.9*  AST 21  ALT 14  ALKPHOS 48  BILITOT 0.5   PT/INR Recent Labs    10/03/22 1543  LABPROT 14.8  INR 1.1     Scheduled Meds:  amLODipine  10 mg Oral Daily   arformoterol  15 mcg Nebulization BID   atorvastatin  20 mg Oral Daily   budesonide (PULMICORT) nebulizer solution  0.25 mg Nebulization BID  Chlorhexidine Gluconate Cloth  6 each Topical Daily   chlorpheniramine-HYDROcodone  5 mL Oral Q12H   furosemide  20 mg Intravenous Once   [START ON 10/07/2022] furosemide  40 mg Intravenous Daily   guaiFENesin  1,200 mg Oral BID   insulin aspart  0-5 Units Subcutaneous QHS   insulin aspart  0-9 Units Subcutaneous TID WC   ipratropium-albuterol  3 mL Nebulization Q6H   methylPREDNISolone (SOLU-MEDROL) injection  40 mg Intravenous Q12H   pantoprazole (PROTONIX) IV  40 mg Intravenous Q12H   potassium chloride  40 mEq Oral Daily   sodium chloride flush  3 mL Intravenous Q12H   sodium  chloride HYPERTONIC  4 mL Nebulization BID   Continuous Infusions:  ceFEPime (MAXIPIME) IV 2 g (10/06/22 0636)   potassium chloride 10 mEq (10/06/22 0915)      Patient profile:   82 year old male with 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   Dysphagia ongoing since completing radiation.   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 Mild-moderate narrowed appearance of upper thoracic esophagus.  13 mm barium tablet did not pass on MBS 4/24 so tablet was not attempted during this study. Moderate esophageal dysmotility with tertiary contractions.  Acute respiratory distress in the setting of CAP SIRS - on broad spectrum antibiotics with vancomycin, cefepime, and Flagyl. 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 Will reschedule when pulmonary status improves. With multiple comorbidities would ideally like to perform EGD while inpatient since it will have to be done in the hospital even as an outpatient due to chronic O2 requirement. - Diet per primary team - continue protonix 40mg  BID  Archana Eckman Leanna Sato  10/06/2022, 9:31 AM

## 2022-10-06 NOTE — Progress Notes (Signed)
   10/06/22 2004  BiPAP/CPAP/SIPAP  BiPAP/CPAP/SIPAP Pt Type Adult  BiPAP/CPAP/SIPAP V60  Mask Type Full face mask  Mask Size Large  Set Rate 12 breaths/min  Respiratory Rate 28 breaths/min  IPAP 10 cmH20  EPAP 5 cmH2O  FiO2 (%) 45 %  Flow Rate 0 lpm  Minute Ventilation 17.1  Leak 27  Peak Inspiratory Pressure (PIP) 11  Tidal Volume (Vt) 597  Patient Home Equipment No  Auto Titrate No  Press High Alarm 25 cmH2O  Press Low Alarm 5 cmH2O  Oxygen Percent 100 %

## 2022-10-06 NOTE — TOC Initial Note (Addendum)
Transition of Care [facility]) - Initial/Assessment Note    Patient Details  Name: [name] MRN: [id number] Date of Birth: 02-25-1941  [facility] CM/SW Contact:    Daniel Atlas, RN Phone Number: 10/06/2022, 3:38 PM  Clinical Narrative:      Per chart review currently in [facility] being treated for SIRS, PMH: COPD, R lung cancer(post chemo/radiation), has R chest port. PT has recommended HHPT. Palliative is following. This RNCM spoke with patient's wife [name] who reports  PTA patient was on 2L home oxygen w/ [facility]. Patient's wife reports patient recently started on home O2 last week. This RNCM spoke w/ [facility] [phone] who reports the same phone number can be used after hours, as [facility] is currently providing home O2 for patient.  No current TOC needs, will continue to follow for d/c needs.             Expected Discharge Plan:  (currently unknown) Barriers to Discharge: Continued Medical Work up   Patient Goals and CMS Choice Patient states their goals for this hospitalization and ongoing recovery are:: to feel better CMS Medicare.gov Compare Post Acute Care list provided to:: Patient Represenative (must comment) (wife: [name]) Choice offered to / list presented to : Spouse [place] ownership interest in [facility].provided to:: Spouse    Expected Discharge Plan and Services In-house Referral: Hospice / Palliative Care Discharge Planning Services: CM Consult Post Acute Care Choice: NA Living arrangements for the past 2 months: Single Family Home                 DME Arranged: N/A DME Agency: NA       HH Arranged: NA HH Agency: NA        Prior Living Arrangements/Services Living arrangements for the past 2 months: Single Family Home Lives with:: Spouse Patient language and need for interpreter reviewed:: Yes Do you feel safe going back to the place where you live?: Yes      Need for Family Participation in Patient Care: Yes  (Comment) Care giver support system in place?: Yes (comment) Current home services: DME (home oxygen- [facility]) Criminal Activity/Legal Involvement Pertinent to Current Situation/Hospitalization: No - Comment as needed  Activities of Daily Living Home Assistive Devices/Equipment: CBG Meter, Eyeglasses, Dentures (specify type), Hearing aid, Oxygen ADL Screening (condition at time of admission) Patient's cognitive ability adequate to safely complete daily activities?: Yes Is the patient deaf or have difficulty hearing?: No Does the patient have difficulty seeing, even when wearing glasses/contacts?: No Does the patient have difficulty concentrating, remembering, or making decisions?: No Patient able to express need for assistance with ADLs?: Yes Does the patient have difficulty dressing or bathing?: No Independently performs ADLs?: Yes (appropriate for developmental age) Does the patient have difficulty walking or climbing stairs?: No Weakness of Legs: Both Weakness of Arms/Hands: Both  Permission Sought/Granted Permission sought to share information with : Case Manager Permission granted to share information with : Yes, Verbal Permission Granted  Share Information with NAME: Case Manager           Emotional Assessment Appearance:: Appears stated age Attitude/Demeanor/Rapport: Unable to Assess Affect (typically observed): Unable to Assess Orientation: : Oriented to Self, Oriented to Place Alcohol / Substance Use: Not Applicable Psych Involvement: No (comment)  Admission diagnosis:  SIRS (systemic inflammatory response syndrome) (HCC) [R65.10] Type 2 diabetes mellitus with hyperglycemia, without long-term current use of insulin (HCC) [E11.65] Sepsis, due to unspecified organism, unspecified  whether acute organ dysfunction present (HCC) [A41.9] Community acquired pneumonia [J18.9] Patient Active Problem List   Diagnosis Date Noted   Severe protein-calorie malnutrition (HCC)  10/05/2022   Community acquired pneumonia 10/04/2022   SIRS (systemic inflammatory response syndrome) (HCC) 10/03/2022   Dysphagia 10/03/2022   Acute hypoxemic respiratory failure (HCC) 09/28/2022   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   Port-A-Cath in place 07/11/2022   Moderate COPD (chronic obstructive pulmonary disease) (HCC) 06/27/2022   Iron deficiency anemia 06/09/2022   Encounter for antineoplastic chemotherapy 06/09/2022   Squamous cell carcinoma of lung (HCC) 06/08/2022   Angioedema 06/03/2022   Lung mass 05/12/2022   Heart murmur 06/02/2021   Acquired plantar porokeratosis    Corns and callosities    Diabetic neuropathy (HCC)    Hammertoes of both feet    Hallux valgus, acquired, bilateral    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 (HCC)    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   Benign prostatic hyperplasia with lower urinary tract symptoms 07/14/2020   Obstructive uropathy 07/14/2020   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   Pure hypercholesterolemia 07/14/2020   Hepatocellular carcinoma (HCC) 10/19/2017   Elevated LFTs 08/26/2014   S/P AVR (aortic valve replacement) 11/22/2013   Hyperlipidemia associated with type 2 diabetes mellitus (HCC) 09/03/2013   Weight loss 09/03/2013   Coronary artery disease 09/03/2013   Hepatitis C 05/29/2013   Unspecified gastritis and gastroduodenitis without mention of hemorrhage 04/24/2013   Iron deficiency anemia due to chronic blood loss 04/23/2013   Hypertension associated with diabetes (HCC)    Type 2 diabetes mellitus with hyperglycemia, without long-term current use of insulin (HCC)    Shortness of breath 04/22/2013   PCP:  Daniel Bussing, MD Pharmacy:   [facility] [phone] - [place], [place] - [address] AT [address] [place] [address] Phone: [phone] Fax: [phone]     Social Determinants of Health (SDOH) Social History: SDOH Screenings    Food Insecurity: No Food Insecurity (10/03/2022)  Housing: Low Risk  (10/03/2022)  Transportation Needs: No Transportation Needs (10/03/2022)  Utilities: Not At Risk (10/03/2022)  Tobacco Use: Medium Risk (10/03/2022)   SDOH Interventions:     Readmission Risk Interventions    10/06/2022    3:19 PM  Readmission Risk Prevention Plan  Transportation Screening Complete  PCP or Specialist Appt within 3-5 Days Complete  HRI or Home Care Consult Complete  Social Work Consult for Recovery Care Planning/Counseling Complete  Palliative Care Screening Complete  Medication Review Oceanographer) Complete

## 2022-10-06 NOTE — Significant Event (Signed)
Rapid Response Event Note   Reason for Call :  Desaturations to the 70s.  Event summary:  Pt receiving breathing treatment upon arrival, oxygen saturations 74%. Applied NRB, oxygen saturations eventually increased to 88-91% on 15L NRB. Respirations labored. Lung sounds diminished. Bp 132/72.    Plan of Care:  Transfer to stepdown   Event Summary:   MD Notified: [name] Call Time: 0740 Arrival Time: 0745 End Time: 0800  Rella Larve, RN

## 2022-10-06 NOTE — Progress Notes (Signed)
[facility]                                                                              [name], is a 82 y.o. male, DOB - November 19, 1940, ZOX:[id number] Admit date - 10/03/2022    Outpatient Primary MD for the patient is [name], [name], MD  LOS - 2  days  No chief complaint on file.      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  Patient reported chills, has chronic cough but nonproductive at baseline however on the day of admission started having yellow sputum production with small streaks of blood.   Patient was found to have a large centrally necrotic right upper lobe lung mass in 05/2022, was diagnosed with squamous cell carcinoma of the lung. He underwent concurrent chemoradiation with carboplatin and paclitaxel s/p 7 cycles with last dose 07/28/2022. He has had partial response. There was concern for radiation pneumonitis resulting in acute hypoxemic respiratory failure.  Recent swallow study showed evidence of small volume aspiration and findings suspicious for upper esophageal stricture Patient was admitted for further workup  10/06/22: Overnight had rapid response with acute respiratory distress, hypoxia, O2 sats down to 70s, was initially placed on 10 L HFNC, with fevers 103.1 F.  O2 weaned down to 5 L, patient was maintaining sats 85 to 89%. Again had a rapid response this morning, with respiratory distress, hypoxia, placed on 15 L O2 HFNC.  Patient transferred to stepdown unit, started on BiPAP.  PCCM consulted  Assessment & Plan    Principal Problem: Acute on Chronic hypoxemic respiratory failure,  underlying history of COPD, lung Ca -Likely due to lung Ca, and history of COPD,  radiation pneumonitis, dysphagia, possibly could have aspirated due to dysphagia/secretions.  Overnight was noted to have O2 sats in 70s, temp of 103.1 F -On O2 2 L via [place], at baseline -This morning again, rapid response with acute respiratory distress, started x-ray showed increased bilateral interstitial opacities and cavitary consolidation in right lung apex, worsening infection and/or edema.  Placed on 15 L O2 HFNC -Placed on IV Solu-Medrol, Pulmicort, Brovana, DuoNebs, Lasix 20mg  IV x1, repeated again 20 mg IV -Transfer to stepdown unit, reassessed in SDU and O2 sats down to 60s-low 80's, ordered BiPAP.  Updated patient's wife at the bedside -Broaden IV antibiotics, added vancomycin, PCCM consulted  Active problems   SIRS (systemic inflammatory response syndrome) (HCC), POA Community-acquired pneumonia, right apical - Presenting with fever, tachycardia, lactic acidosis.  CXR with chronic right lung volume loss with RUL pleural parenchymal opacity  -CT chest showed right apical consolidation and interstitial thickening -COVID, flu, RSV negative.  Resp viral panel negative.  MRSA PCR negative  -BC ID 1/4 positive for Staph epidermidis, possibly contaminant -Urine culture + multiple species -- Overnight spiking fevers, added vancomycin, continue IV cefepime   Dysphagia -  Recent swallow study with evidence for small volume aspiration as well as concern for esophageal stricture.  Patient does report occasional choking episodes when eating. -- SLP: mild aspiration risk, recommended thin liquids, currently on dysphagia 1 diet -Continue PPI.  Plan for EGD today however canceled due to acute respiratory failure, GI notified.   Type 2 diabetes mellitus with hyperglycemia, without long-term current use of insulin  Hemoglobin A1c 6.8 on 06/03/2022, well-controlled. -Hold metformin CBG (last 3)  Recent Labs    10/05/22 1846 10/05/22 2132 10/06/22 0745  GLUCAP 366* 204* 190*   -Due to IV  steroids, expected CBGs to rise, will place on moderate sliding scale insulin every 4hrs   Hypokalemia -Ordered IV potassium x 5, follow magnesium   Squamous cell carcinoma of lung  - Follows with oncology Dr. Arbutus Ped.  - Chemoradiation on hold currently due to concern for radiation pneumonitis.   Moderate COPD (chronic obstructive pulmonary disease) -See #1   Hyperlipidemia associated  -- Continue Lipitor   Essential hypertension -Continue amlodipine 10 mg daily, hydralazine IV as needed  Hypernatremia -Improving, IV fluids discontinued  Severe protein calorie malnutrition Nutrition Problem: Inadequate oral intake Etiology: dysphagia Signs/Symptoms: per patient/family report Interventions: Magic cup, Ensure Enlive (each supplement provides 350kcal and 20 grams of protein) Estimated body mass index is 16.74 kg/m as calculated from the following:   Height as of this encounter: 5\' 11"  (1.803 m).   Weight as of this encounter: 54.4 kg.  Code Status: DNR DVT Prophylaxis:     Level of Care: Level of care: Stepdown Family Communication: Updated patient's wife at the bedside Disposition Plan:      Remains inpatient appropriate:   Workup in progress  PCCM is assuming care, highly appreciate assistance.  I will sign off and [facility] will pick the patient back up once out of stepdown.  Procedures:   Consultants:   GI Pulmonary critical care  Antimicrobials:   Anti-infectives (From admission, onward)    Start     Dose/Rate Route Frequency Ordered Stop   10/04/22 1600  vancomycin (VANCOREADY) IVPB 750 mg/150 mL  Status:  Discontinued        750 mg 150 mL/hr over 60 Minutes Intravenous Every 24 hours 10/03/22 1959 10/04/22 0846   10/04/22 0600  ceFEPIme (MAXIPIME) 2 g in sodium chloride 0.9 % 100 mL IVPB        2 g 200 mL/hr over 30 Minutes Intravenous Every 12 hours 10/03/22 1948     10/03/22 2200  metroNIDAZOLE (FLAGYL) IVPB 500 mg  Status:  Discontinued        500 mg 100  mL/hr over 60 Minutes Intravenous Every 12 hours 10/03/22 1928 10/04/22 1059   10/03/22 1930  ceFEPIme (MAXIPIME) 2 g in sodium chloride 0.9 % 100 mL IVPB  Status:  Discontinued        2 g 200 mL/hr over 30 Minutes Intravenous  Once 10/03/22 1928 10/03/22 1948   10/03/22 1600  vancomycin (VANCOCIN) IVPB 1000 mg/200 mL premix        1,000 mg 200 mL/hr over 60 Minutes Intravenous  Once 10/03/22 1551 10/03/22 1824   10/03/22 1600  ceFEPIme (MAXIPIME) 2 g in sodium chloride 0.9 % 100 mL IVPB        2 g 200 mL/hr over 30 Minutes Intravenous  Once 10/03/22 1551 10/03/22 1655          Medications  amLODipine  10 mg Oral Daily   arformoterol  15 mcg Nebulization BID  atorvastatin  20 mg Oral Daily   budesonide (PULMICORT) nebulizer solution  0.25 mg Nebulization BID   Chlorhexidine Gluconate Cloth  6 each Topical Daily   furosemide  20 mg Intravenous Once   guaiFENesin  1,200 mg Oral BID   insulin aspart  0-5 Units Subcutaneous QHS   insulin aspart  0-9 Units Subcutaneous TID WC   ipratropium-albuterol  3 mL Nebulization Q6H   methylPREDNISolone (SOLU-MEDROL) injection  40 mg Intravenous Q12H   pantoprazole (PROTONIX) IV  40 mg Intravenous Q12H   potassium chloride  40 mEq Oral Daily   sodium chloride flush  3 mL Intravenous Q12H   sodium chloride HYPERTONIC  4 mL Nebulization BID      Subjective:   [name] was seen and examined today.  Seen patient twice this morning, multiple rapid responses with acute respiratory distress, hypoxia, O2 sats in 70s, lungs coarse and congested.  Objective:   Vitals:   10/06/22 0025 10/06/22 0121 10/06/22 0347 10/06/22 0530  BP:    125/66  Pulse:    67  Resp: (!) 23   20  Temp:  98.7 F (37.1 C)  (!) 97.4 F (36.3 C)  TempSrc:  Oral  Oral  SpO2: (!) 89%  97% 96%  Weight:      Height:        Intake/Output Summary (Last 24 hours) at 10/06/2022 0808 Last data filed at 10/06/2022 0300 Gross per 24 hour  Intake 100 ml  Output 1050  ml  Net -950 ml     Wt Readings from Last 3 Encounters:  10/03/22 54.4 kg  09/28/22 54.5 kg  09/08/22 58.6 kg    Physical Exam General: Alert and oriented, on NRB Cardiovascular: S1 S2 clear, RRR.  Tachycardia Respiratory: Bilateral lungs congested, coarse rhonchi Gastrointestinal: Soft, nontender, nondistended, NBS Ext: no pedal edema bilaterally Neuro: no new deficits Psych:     Data Reviewed:  I have personally reviewed following labs    CBC Lab Results  Component Value Date   WBC 6.5 10/06/2022   RBC 3.27 (L) 10/06/2022   HGB 8.7 (L) 10/06/2022   HCT 29.3 (L) 10/06/2022   MCV 89.6 10/06/2022   MCH 26.6 10/06/2022   PLT 170 10/06/2022   MCHC 29.7 (L) 10/06/2022   RDW 21.5 (H) 10/06/2022   LYMPHSABS 0.2 (L) 10/03/2022   MONOABS 0.7 10/03/2022   EOSABS 0.0 10/03/2022   BASOSABS 0.0 10/03/2022     Last metabolic panel Lab Results  Component Value Date   NA 142 10/06/2022   K 2.9 (L) 10/06/2022   CL 109 10/06/2022   CO2 26 10/06/2022   BUN 22 10/06/2022   CREATININE 0.93 10/06/2022   GLUCOSE 160 (H) 10/06/2022   GFRNONAA >60 10/06/2022   GFRAA >90 11/22/2013   CALCIUM 8.2 (L) 10/06/2022   PHOS 2.7 10/05/2022   PROT 6.6 10/05/2022   ALBUMIN 1.9 (L) 10/05/2022   BILITOT 0.5 10/05/2022   ALKPHOS 48 10/05/2022   AST 21 10/05/2022   ALT 14 10/05/2022   ANIONGAP 7 10/06/2022    CBG (last 3)  Recent Labs    10/05/22 1846 10/05/22 2132 10/06/22 0745  GLUCAP 366* 204* 190*      Coagulation Profile: Recent Labs  Lab 10/03/22 1543  INR 1.1     Radiology Studies: I have personally reviewed the imaging studies  CT CHEST W CONTRAST  Result Date: 10/04/2022 CLINICAL DATA:  History of non-small-cell lung cancer and HCV cirrhosis status post segment 5  ablation in 2019 with new weakness, cough, and altered mental status * Tracking Code: BO * EXAM: CT CHEST WITH CONTRAST TECHNIQUE: Multidetector CT imaging of the chest was performed during  intravenous contrast administration. RADIATION DOSE REDUCTION: This exam was performed according to the departmental dose-optimization program which includes automated exposure control, adjustment of the mA and/or kV according to patient size and/or use of iterative reconstruction technique. CONTRAST:  75mL OMNIPAQUE IOHEXOL 300 MG/ML  SOLN COMPARISON:  Chest radiograph dated 10/03/2022, CT chest dated 08/29/2022, MR abdomen dated 10/09/2020 FINDINGS: Cardiovascular: Right chest wall port terminates at the superior cavoatrial junction. Prior aortic valve replacement. Normal heart size. No significant pericardial fluid/thickening. Great vessels are normal in course and caliber. No central pulmonary emboli. Coronary artery calcifications and aortic atherosclerosis. Mediastinum/Nodes: Imaged thyroid gland without nodules meeting criteria for imaging follow-up by size. Normal esophagus. No pathologically enlarged axillary, supraclavicular, mediastinal, or hilar lymph nodes. Previously noted precarinal lymph node has decreased in size, measuring 7 mm (2:65), previously 11 mm, and subcarinal lymph node measures 9 mm (2:72), previously 13 mm. Lungs/Pleura: The central airways are patent. Upper lobe predominant centrilobular and paraseptal emphysema. Thick-walled cavitary lesion along the medial suprahilar right upper lobe is decreased in size, measuring 4.4 x 3.6 cm, previously 4.6 x 3.9 cm with interval increased conspicuity of a peripheral nodular component along the posteromedial aspect measuring 1.3 x 0.8 cm (6:38). There is increased adjacent right apical consolidation and interstitial thickening. Previously noted right lower lobe consolidation has largely resolved with a small amount of residual consolidation along the superior segment right lower lobe. Dependent ground-glass density in the right lower lobe is also seen. Left upper lobe peribronchovascular consolidation has largely resolved. No pneumothorax. Interval  resolution of right pleural effusion. No pleural effusion. Upper abdomen: Partially imaged bilateral simple renal cysts. No specific follow-up imaging recommended. Subcentimeter hypoattenuating focus within the partially imaged inferior right hepatic lobe (7:55) correspond to suspected prior ablation site noted on MRI. Musculoskeletal: No acute or abnormal lytic or blastic osseous lesions. Median sternotomy wires are nondisplaced. IMPRESSION: 1. Decreased size of thick-walled cavitary lesion along the medial suprahilar right upper lobe with interval increased conspicuity of a peripheral nodular component. Increased adjacent right apical consolidation and interstitial thickening, likely infectious/inflammatory. Attention on follow-up. 2. Previously noted right lower lobe consolidation has largely resolved with a small amount of residual consolidation along the superior segment right lower lobe. Dependent ground-glass density in the right lower lobe is also seen. Left upper lobe peribronchovascular consolidation has largely resolved. 3. Previously noted precarinal and subcarinal lymph nodes have decreased in size, likely reactive. 4. Aortic Atherosclerosis (ICD10-I70.0) and Emphysema (ICD10-J43.9). Coronary artery calcifications. Assessment for potential risk factor modification, dietary therapy or pharmacologic therapy may be warranted, if clinically indicated. Electronically Signed   By: Agustin Cree M.D.   On: 10/04/2022 08:54       Maximilliano Kersh M.D. [facility] 10/06/2022, 8:08 AM  Available via Epic secure chat 7am-7pm After 7 pm, please refer to night coverage provider listed on amion.

## 2022-10-06 NOTE — Consult Note (Signed)
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  Workup has revealed acute on chronic hypoxemic respiratory failure thought multifactorial with aspiration, radiation pneumonitis flare leading differential.  Over the past day has developed worsening oxygen requirements and some concern for volume overload.  Multiple rapid responses called overnight for hypoxemia.  The patient is transferred to the ICU for initiation of BiPAP.  Of note the patient is DNR.  Wife is at bedside to corroborate history.  States he initially improved and then got worse yesterday.  Mental status is also worsened over the past 24 hours.  Has been having some failure to thrive at home attributed to the radiation pneumonitis and dysphagia.  Recent esophagram with possible stricture, planned for EGD this aftenoon which I don't think is going to be safe unfortunately.  Patient is currently breathing in the 30s to 40s on BiPAP, anxious, clearly air hungry.  Pertinent  Medical History  Lung cancer history which can be reviewed in Dr. Asa Lente note from 09/01/2022  Significant Hospital Events: Including procedures, antibiotic start and stop dates in addition to other pertinent events   5/27 admit 5/30 ICU transfer  Interim History / Subjective:  Consult  Objective   Blood pressure (!) 112/52, pulse (!) 120, temperature (!) 97.4 F (36.3 C), temperature source Oral, resp. rate (!) 38, height 5\' 11"  (1.803 m), weight 54.4 kg, SpO2 (!) 85 %.    FiO2 (%):  [100 %] 100 %   Intake/Output Summary (Last 24 hours) at 10/06/2022 0941 Last  data filed at 10/06/2022 4098 Gross per 24 hour  Intake 100 ml  Output 1200 ml  Net -1100 ml   Filed Weights   10/03/22 1530  Weight: 54.4 kg    Examination: General: Ill-appearing man tachypneic in bed HENT: BiPAP in place with good seal Lungs: Rhonchi bilaterally, positive accessory muscle use Cardiovascular: Tachycardic, regular, extremities warm Abdomen: Soft, positive bowel sounds Extremities: Positive muscle wasting Neuro: Moves to command Skin no rashes  Patient Lines/Drains/Airways Status     Active Line/Drains/Airways     Name Placement date Placement time Site Days   Implanted Port 06/30/22 Right Chest 06/30/22  1545  Chest  98   Peripheral IV 10/03/22 20 G Left Antecubital 10/03/22  1545  Antecubital  3   External Urinary Catheter 10/03/22  2100  --  3           2/7 weight 132 lbs, current weight 118 lbs  Resolved Hospital Problem list   N/A  Assessment & Plan:  Acute on chronic hypoxemic respiratory failure-multifactorial with concern for aspiration, radiation pneumonitis, volume overload Dysphagia with abnormal esophagram-concern for radiation-induced esophageal stricture Failure to thrive, severe protein calorie malnutrition POA Moderate COPD by PFTs Hx DVT - Lasix, steroids, vanc/zosyn - Check LE duplex - BIPAP titrated to sats >90% and WOB - Precedex to help with BIPAP tolerance - Eventual EGD depending on clinical trajectory - Started GOC talk, if deteriorates further need to consider transitioning to comfort care inpatient, wife not ready for this conversation at this time  Best Practice (right click and "Reselect all SmartList Selections" daily)   Diet/type:  NPO DVT prophylaxis: LMWH GI prophylaxis: N/A Lines: N/A Foley:  N/A Code Status:  DNR Last date of multidisciplinary goals of care discussion [pending]  Labs   CBC: Recent Labs  Lab 10/03/22 1543 10/04/22 0500 10/05/22 0500 10/06/22 0618  WBC 11.0* 10.8* 8.2 6.5  NEUTROABS  10.0*  --   --   --   HGB 10.1* 10.2* 9.2* 8.7*  HCT 33.0* 32.6* 30.5* 29.3*  MCV 89.7 88.6 89.4 89.6  PLT 265 252 223 170    Basic Metabolic Panel: Recent Labs  Lab 10/03/22 1543 10/04/22 0500 10/05/22 0500 10/05/22 1750 10/06/22 0618  NA 145 146* 146*  --  142  K 3.9 2.9* 3.1*  --  2.9*  CL 108 111 111  --  109  CO2 25 29 28   --  26  GLUCOSE 599* 74 123* 444* 160*  BUN 35* 23 24*  --  22  CREATININE 1.22 0.79 0.83  --  0.93  CALCIUM 9.3 9.1 8.8*  --  8.2*  MG  --  1.8 1.8  --   --   PHOS  --   --  2.7  --   --    GFR: Estimated Creatinine Clearance: 49.6 mL/min (by C-G formula based on SCr of 0.93 mg/dL). Recent Labs  Lab 10/03/22 1540 10/03/22 1543 10/03/22 1839 10/04/22 0500 10/05/22 0500 10/06/22 0618  PROCALCITON  --   --   --  0.46  --   --   WBC  --  11.0*  --  10.8* 8.2 6.5  LATICACIDVEN 2.5*  --  1.9  --   --   --     Liver Function Tests: Recent Labs  Lab 10/03/22 1543 10/05/22 0500  AST 16 21  ALT 14 14  ALKPHOS 70 48  BILITOT 0.7 0.5  PROT 7.6 6.6  ALBUMIN 2.4* 1.9*   No results for input(s): "LIPASE", "AMYLASE" in the last 168 hours. No results for input(s): "AMMONIA" in the last 168 hours.  ABG    Component Value Date/Time   PHART 7.347 (L) 11/20/2013 0045   PCO2ART 43.4 11/20/2013 0045   PO2ART 100.0 11/20/2013 0045   HCO3 23.7 11/20/2013 0045   TCO2 23 11/20/2013 1808   ACIDBASEDEF 2.0 11/20/2013 0045   O2SAT 97.0 11/20/2013 0045     Coagulation Profile: Recent Labs  Lab 10/03/22 1543  INR 1.1    Cardiac Enzymes: No results for input(s): "CKTOTAL", "CKMB", "CKMBINDEX", "TROPONINI" in the last 168 hours.  HbA1C: Hgb A1c MFr Bld  Date/Time Value Ref Range Status  10/04/2022 05:00 AM 11.4 (H) 4.8 - 5.6 % Final    Comment:    (NOTE)         Prediabetes: 5.7 - 6.4         Diabetes: >6.4         Glycemic control for adults with diabetes: <7.0   06/03/2022 02:43 AM 6.8 (H) 4.8 - 5.6 % Final    Comment:    (NOTE) Pre  diabetes:          5.7%-6.4%  Diabetes:              >6.4%  Glycemic control for   <7.0% adults with diabetes     CBG: Recent Labs  Lab 10/05/22 1127 10/05/22 1700 10/05/22 1846 10/05/22 2132 10/06/22 0745  GLUCAP 279* 407* 366* 204* 190*    Review of Systems:   Limited due to WOB  Past Medical History:  He,  has  a past medical history of Acute epididymo-orchitis, Aortic stenosis, Arthritis, Diabetes mellitus without complication (HCC), ED (erectile dysfunction), Essential hypertension, benign, Heart murmur, Hep C w/o coma, chronic (HCC), History of blood transfusion, History of DVT of lower extremity, History of radiation therapy, echocardiogram, Hypercholesterolemia, and PONV (postoperative nausea and vomiting).   Surgical History:   Past Surgical History:  Procedure Laterality Date   AORTIC VALVE REPLACEMENT N/A 11/19/2013   Procedure: AORTIC VALVE REPLACEMENT (AVR);  Surgeon: Alleen Borne, MD;  Location: [facility] OR;  Service: Open Heart Surgery;  Laterality: N/A;   BUNIONECTOMY Bilateral 05/09/2006   CARDIAC CATHETERIZATION  10/01/2013   COLONOSCOPY  01/07/2010   internal hemorrhoids.  Dr Arlyce Dice   ESOPHAGOGASTRODUODENOSCOPY N/A 04/24/2013   Procedure: ESOPHAGOGASTRODUODENOSCOPY (EGD);  Surgeon: Beverley Fiedler, MD;  Location: [facility] ENDOSCOPY;  Service: Endoscopy;  Laterality: N/A;   INGUINAL HERNIA REPAIR Right    INTRAOPERATIVE TRANSESOPHAGEAL ECHOCARDIOGRAM N/A 11/19/2013   Procedure: INTRAOPERATIVE TRANSESOPHAGEAL ECHOCARDIOGRAM;  Surgeon: Alleen Borne, MD;  Location: [facility] OR;  Service: Open Heart Surgery;  Laterality: N/A;   IR IMAGING GUIDED PORT INSERTION  06/30/2022   KNEE ARTHROSCOPY Left 05/09/2009   Dr Leslee Home   LAMINECTOMY  06/09/2005   Dr Jeral Fruit. multiple lumbar level laminectomies.    LEFT AND RIGHT HEART CATHETERIZATION WITH CORONARY ANGIOGRAM N/A 10/01/2013   Procedure: LEFT AND RIGHT HEART CATHETERIZATION WITH CORONARY ANGIOGRAM;  Surgeon: Donato Schultz, MD;   Location: [facility] CATH LAB;  Service: Cardiovascular;  Laterality: N/A;   SPINAL FUSION     lumbar   TEE WITHOUT CARDIOVERSION  11/07/2003   Aoritic valve sclerosis.    TONSILLECTOMY     VIDEO BRONCHOSCOPY WITH ENDOBRONCHIAL ULTRASOUND N/A 06/02/2022   Procedure: VIDEO BRONCHOSCOPY WITH ENDOBRONCHIAL ULTRASOUND;  Surgeon: Luciano Cutter, MD;  Location: [facility] OR;  Service: Thoracic;  Laterality: N/A;     Social History:   reports that he quit smoking about 31 years ago. His smoking use included cigarettes. He has a 46.50 pack-year smoking history. He has never used smokeless tobacco. He reports that he does not drink alcohol and does not use drugs.   Family History:  His family history includes Diabetes in his father and mother; Hypertension in his unknown relative; Stroke in his father and mother. There is no history of Heart attack.   Allergies Allergies  Allergen Reactions   Azithromycin Swelling    Angioedema 05/2022    Lisinopril     Angioedema 05/2022   Ezetimibe-Simvastatin     Leg pains Restlessness at night     Metoprolol     bradycardia   Rosuvastatin     Discomfort, RESTLESS, CAN'T SLEEP      Home Medications  Prior to Admission medications   Medication Sig Start Date End Date Taking? Authorizing Provider  acetaminophen (TYLENOL) 500 MG tablet Take 500 mg by mouth every 6 (six) hours as needed for mild pain or moderate pain.   Yes [provider]  amLODipine (NORVASC) 10 MG tablet Take 1 tablet (10 mg total) by mouth daily. 06/06/22  Yes Amin, Loura Halt, MD  aspirin EC 81 MG EC tablet Take 1 tablet (81 mg total) by mouth daily. 11/24/13  Yes Gold, Wayne E, PA-C  atorvastatin (LIPITOR) 20 MG tablet Take 20 mg by mouth at bedtime.   Yes [provider]  fluticasone-salmeterol (ADVAIR HFA) 230-21 MCG/ACT inhaler Inhale 2 puffs into the lungs 2 (two) times daily. 09/28/22  Yes Luciano Cutter, MD  hydrochlorothiazide (HYDRODIURIL) 12.5  MG tablet Take 1  tablet (12.5 mg total) by mouth daily. 06/06/22  Yes Amin, Ankit Chirag, MD  lidocaine-prilocaine (EMLA) cream Apply 1 Application topically as needed. 06/17/22  Yes Heilingoetter, Cassandra L, PA-C  metFORMIN (GLUCOPHAGE) 1000 MG tablet Take 1,000 mg by mouth 2 (two) times daily with a meal.   Yes [provider]  Multiple Vitamin (MULTIVITAMIN WITH MINERALS) TABS tablet Take 1 tablet by mouth daily.   Yes [provider]  predniSONE (DELTASONE) 10 MG tablet 6 tablet p.o. daily for 1 week followed by 5 tablets p.o. daily for 1 week followed by 3 tablets p.o. daily for 1 week, followed by 2 tablets p.o. daily for 1 week followed by 1 tablet p.o. daily for 1 week. 09/28/22  Yes Luciano Cutter, MD  SF 5000 PLUS 1.1 % CREA dental cream Place 1 Application onto teeth daily. 02/20/18  Yes [provider]  sucralfate (CARAFATE) 1 g tablet Take 1 tablet (1 g total) by mouth 4 (four) times daily -  with meals and at bedtime. Crush and dissolve in 10 mL of warm water, swallow 20 min before meals 07/12/22  Yes Antony Blackbird, MD  glucose blood test strip  01/15/14   [provider]  glucose blood test strip as directed DX 250 01/15/14   [provider]     Critical care time: 34 mins

## 2022-10-06 NOTE — Consult Note (Signed)
Palliative Medicine Inpatient Consult Note  Consulting Provider: Cathren Harsh, MD   Reason for consult:   Palliative Care Consult Services Palliative Medicine Consult  Reason for Consult? 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 At this time patient is suffering from hypoxemic respiratory failure.   Palliative care has been asked to get involved for additional conversations related to goals of care.   Clinical Assessment/Goals of Care:  *Please note that this is a verbal dictation therefore any spelling or grammatical errors are due to the "Dragon Medical One" system interpretation.  I have reviewed medical records including EPIC notes, labs and imaging, received report from bedside RN, assessed the patient who is lying in bed on bipap.    I met with [name] to further discuss diagnosis prognosis, GOC, EOL wishes, disposition and options.   I introduced Palliative Medicine as specialized medical care for people living with serious illness. It focuses on providing relief from the symptoms and stress of a serious illness. The goal is to improve quality of life for both the patient and the family.  Medical History Review and Understanding:  A review of [name]'s history significant for aortic stenosis, arthritis, diabetes mellitus, essential hypertension, deep vein thrombosis, and COPD was completed.   Social History:  [name] He has been married to his wife, [name] for the past 51(+) years. He has a son and a daughter. He does not have grandchildren at this time. He formerly worked as a  Music therapist and more recently for [employer]. He loves sports - notably football as well as music. He is an outdoors man and use to enjoy camping and fishing. He is a man of faith and practices at [facility].   Functional and Nutritional State:  Preceding cancer diagnosis in December was fully functional. Though since then has gotten weaker and per his spouse lost a great deal of weight.   Advance Directives:  A detailed discussion was had today regarding advanced directives.  Per patients spouse he has previously completed advance directives.   Code Status:  Concepts specific to code status, artifical feeding and hydration, continued IV antibiotics and rehospitalization was had.  The difference between a aggressive medical intervention path  and a palliative comfort care path for this patient at this time was had.   [name] is an established DNAR/DNI code status as confirmed with his spouse, [name].   Discussion:  Per [name] was doing fairly well until Monday when she identified he was warm and had a fever. She shares he was brought to the hospital and identified as septic. She is aware that he is now suffering from hypoxemic respiratory failure. She shares that there is hope for improvements.  We reviewed that over the oncoming days if [name] should neglect to improve and/or worsen some more difficult decisions may need to be made - notably the possible decision to keep [name] comfortable. [name] is aware of the present tenuous situation that her husband is in. She shares the knowledge that death is a certainty in life. She shares that she and her family would not want for [name] to suffer.   Discussed the importance  of continued conversation with family and their  medical providers regarding overall plan of care and treatment options, ensuring decisions are within the context of the patients values and GOCs.  Decision Maker: [name],[name] (Spouse): [phone]  (Mobile)   SUMMARY OF RECOMMENDATIONS   DNAR/DNI  Allowing time for outcomes --> patients spouse is quite aware of  the likelihood for further deterioration  Ongoing PMT support   Code Status/Advance Care Planning: DNAR/DNI   Palliative Prophylaxis:  Aspiration, Bowel Regimen, Delirium Protocol, Frequent Pain Assessment, Oral Care, Palliative Wound Care, and Turn Reposition  Additional Recommendations (Limitations, Scope, Preferences): Continue current care  Psycho-social/Spiritual:  Desire for further Chaplaincy support: Patient is [facility] Additional Recommendations: Patients spouse is aware of difficult situation that [name] is in - support provided   Prognosis: Limited overall.   Discharge Planning: Discharge plan is uncertain at this time.  Vitals:   10/06/22 1100 10/06/22 1200  BP: 106/65 (!) 106/55  Pulse: (!) 101 94  Resp: (!) 27 (!) 26  Temp:  99 F (37.2 C)  SpO2: 100% 100%    Intake/Output Summary (Last 24 hours) at 10/06/2022 1306 Last data filed at 10/06/2022 1200 Gross per 24 hour  Intake 100 ml  Output 1650 ml  Net -1550 ml   Last Weight  Most recent update: 10/03/2022  3:30 PM    Weight  54.4 kg (120 lb)            Gen:  Elderly AA M on Bipap HEENT: Dry mucous membranes CV: Irregular rate and rhythm  PULM: On Bipap ABD: soft/nontender  EXT: (+) muscle wasting Neuro: Somnolent  PPS: 10%   This conversation/these recommendations were discussed with patient primary care team, Dr. Katrinka Blazing  Billing based on MDM: High  Problems Addressed: One acute or chronic illness or injury that poses a threat to life or bodily function  Amount and/or Complexity of Data: Category 3:Discussion of management or test interpretation with external physician/other qualified health care professional/appropriate source (not separately reported)  Risks: Decision regarding hospitalization or escalation of hospital  care ______________________________________________________ [name] [facility] Palliative Medicine Team Team Cell Phone: [phone] Please utilize secure chat with additional questions, if there is no response within 30 minutes please call the above phone number  Palliative Medicine Team providers are available by phone from 7am to 7pm daily and can be reached through the team cell phone.  Should this patient require assistance outside of these hours, please call the patient's attending physician.

## 2022-10-06 NOTE — Progress Notes (Signed)
   10/06/22 1524  BiPAP/CPAP/SIPAP  BiPAP/CPAP/SIPAP Pt Type Adult  BiPAP/CPAP/SIPAP V60  Mask Type Full face mask  Mask Size Large  Respiratory Rate 16 breaths/min  IPAP 8 cmH20  EPAP 5 cmH2O  FiO2 (%) (S)  55 % (titrated from 70% (was 99% on 70%))  Minute Ventilation 11.8  Leak 15  Peak Inspiratory Pressure (PIP) 8  Tidal Volume (Vt) 556  Patient Home Equipment No  Auto Titrate No  CPAP/SIPAP surface wiped down Yes  BiPAP/CPAP /SiPAP Vitals  Pulse Rate 84  Resp 19  BP 101/62  SpO2 95 %  Bilateral Breath Sounds Diminished;Clear  MEWS Score/Color  MEWS Score 1  MEWS Score Color Green

## 2022-10-06 NOTE — Significant Event (Signed)
Rapid Response Event Note   Reason for Call :  Patient's oxygen saturation went down to the 70's on 2L/min Oxygen via [place].  Initial Focused Assessment:  Patient reportedly got into a coughing spell and desaturated to 70's. Oxygen was titrated up to high flow at 10L/min to achieve 92%. Patient is alert, responsive, and following commands. Patient has a fever of 103.1 orally and bedside RN has medicated with Tylenol, cooled room down, and placed ice bags on patient.  Lung sounds are congested and diminished, no adverse heart sounds. No peripheral edema observed. Patient denies pain. VS: BP 151/77 map 95, HR 101, RR 23.  Interventions:  Assessment and chart review. Communicated with on call provider.  New orders given for Tussinex cough syrup. Oxygen weaned down to 5L/min HFNC and maintained saturations from 85-89%.   Plan of Care:  Continue to monitor patient on telemetry, continuous oxygen saturation, and with current level of care. If no improvement or worsening, call RR and provider.  Event Summary:   MD Notified: Anthoney Harada, NP Call Time: 0001 Arrival Time: 0009 End Time: 0100  Lamona Curl, RN

## 2022-10-06 NOTE — Progress Notes (Signed)
At 0710 this AM night shift RN reported that patient has O2 sats down to 70's as happened last night. O2 increased to 10 L, nebulizer treatment started and rapid response called to see patient. Dr Isidoro Donning paged with update. Rapid response evaluated patient and move to Step down unit indicated. Report called to [employer].

## 2022-10-06 NOTE — Progress Notes (Addendum)
Pharmacy Antibiotic Note  [name] is a 82 y.o. male admitted on 10/03/2022 with sepsis 2/2 PNA. 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   Pt was initiated on broad spectrum antibiotics (cefepime, vancomycin, metronidazole) on admission. De-escalated to cefepime on 5/28. BCID + 1/4 staph epi, mecA detected, thought to be contaminant. Rapid response on 5/30 > desat, febrile. Pharmacy consulted to restart vancomycin. Pt transferred to step down.  Today, 10/06/22 WBC now WNL Febrile - Tmax 103.1 F SCr stable Total body weight < IBW. Total weight used for dose calculations  Plan: Continue cefepime 2 g IV q12h Vancomycin 1000 mg IV once followed by 750 mg IV q24h for estimated AUC of 455. Goal AUC 400-550.  Monitor renal function, culture data. Check vancomycin levels as needed  Height: 5\' 11"  (180.3 cm) Weight: 54.4 kg (120 lb) IBW/kg (Calculated) : 75.3  Temp (24hrs), Avg:100 F (37.8 C), Min:97.4 F (36.3 C), Max:103.1 F (39.5 C)  Recent Labs  Lab 10/03/22 1540 10/03/22 1543 10/03/22 1839 10/04/22 0500 10/05/22 0500 10/06/22 0618  WBC  --  11.0*  --  10.8* 8.2 6.5  CREATININE  --  1.22  --  0.79 0.83 0.93  LATICACIDVEN 2.5*  --  1.9  --   --   --      Estimated Creatinine Clearance: 49.6 mL/min (by C-G formula based on SCr of 0.93 mg/dL).    Allergies  Allergen Reactions   Azithromycin Swelling    Angioedema 05/2022    Lisinopril     Angioedema 05/2022   Ezetimibe-Simvastatin     Leg pains Restlessness at night     Metoprolol     bradycardia   Rosuvastatin     Discomfort, RESTLESS, CAN'T SLEEP     Antimicrobials this admission: cefepime 5/27 >>  Metronidazole 5/27 >> 5/28 vancomycin 5/27 >> 5/28. Resume 5/30 >>  Dose adjustments this admission:  Microbiology results: 5/27 BCx: 1/4 GPC - staph epi, mecA detected 5/27 UCx: Multiple species 5/27 MRSA PCR: Negative  Cindi Carbon, PharmD 10/06/2022 10:12 AM  Addendum:  Pharmacy consulted to change cefepime to piperacillin/tazobactam for aspiration PNA. D/c cefepime. Start piperacillin/tazobactam 3.375 g IV q8h EI.  Cindi Carbon, PharmD 10/06/22 10:33 AM

## 2022-10-06 NOTE — Progress Notes (Signed)
PT Cancellation Note  Patient Details Name: [name] MRN: [id number] DOB: [birth year]   Cancelled Treatment:    Reason Eval/Treat Not Completed: Other (comment). RN requesting we hold, pt currently on bipap, was transferred to ICU Stepdown over night due to desatting. Will continue to follow and progress as appropriate.    Domenick Bookbinder PT, DPT 10/06/22, 11:58 AM

## 2022-10-06 NOTE — Progress Notes (Signed)
Bilateral lower extremity venous study completed.   Preliminary results relayed to Md and RN.  Please see CV Procedures for preliminary results.  Riel Hirschman, RVT  1:31 PM 10/06/22

## 2022-10-07 DIAGNOSIS — Z515 Encounter for palliative care: Secondary | ICD-10-CM | POA: Diagnosis not present

## 2022-10-07 DIAGNOSIS — J9601 Acute respiratory failure with hypoxia: Secondary | ICD-10-CM

## 2022-10-07 DIAGNOSIS — Z7189 Other specified counseling: Secondary | ICD-10-CM

## 2022-10-07 DIAGNOSIS — R627 Adult failure to thrive: Secondary | ICD-10-CM

## 2022-10-07 DIAGNOSIS — J9621 Acute and chronic respiratory failure with hypoxia: Secondary | ICD-10-CM | POA: Insufficient documentation

## 2022-10-07 LAB — CBC
HCT: 30.1 % — ABNORMAL LOW (ref 39.0–52.0)
Hemoglobin: 9.3 g/dL — ABNORMAL LOW (ref 13.0–17.0)
MCH: 27.5 pg (ref 26.0–34.0)
MCHC: 30.9 g/dL (ref 30.0–36.0)
MCV: 89.1 fL (ref 80.0–100.0)
Platelets: 204 10*3/uL (ref 150–400)
RBC: 3.38 MIL/uL — ABNORMAL LOW (ref 4.22–5.81)
RDW: 21.2 % — ABNORMAL HIGH (ref 11.5–15.5)
WBC: 9.8 10*3/uL (ref 4.0–10.5)
nRBC: 0 % (ref 0.0–0.2)

## 2022-10-07 LAB — GLUCOSE, CAPILLARY
Glucose-Capillary: 263 mg/dL — ABNORMAL HIGH (ref 70–99)
Glucose-Capillary: 183 mg/dL — ABNORMAL HIGH (ref 70–99)
Glucose-Capillary: 142 mg/dL — ABNORMAL HIGH (ref 70–99)
Glucose-Capillary: 296 mg/dL — ABNORMAL HIGH (ref 70–99)

## 2022-10-07 LAB — BASIC METABOLIC PANEL
Anion gap: 7 (ref 5–15)
BUN: 27 mg/dL — ABNORMAL HIGH (ref 8–23)
CO2: 26 mmol/L (ref 22–32)
Calcium: 8.6 mg/dL — ABNORMAL LOW (ref 8.9–10.3)
Chloride: 108 mmol/L (ref 98–111)
Creatinine, Ser: 1.15 mg/dL (ref 0.61–1.24)
GFR, Estimated: >60 mL/min (ref >60)
Glucose, Bld: 183 mg/dL — ABNORMAL HIGH (ref 70–99)
Potassium: 3.3 mmol/L — ABNORMAL LOW (ref 3.5–5.1)
Sodium: 141 mmol/L (ref 135–145)

## 2022-10-07 LAB — CULTURE, BLOOD (ROUTINE X 2)

## 2022-10-07 MED ORDER — ORAL CARE MOUTH RINSE
15.0000 mL | OROMUCOSAL | Status: DC
  Administered 2022-10-07 – 2022-10-08 (×4): 15 mL via OROMUCOSAL

## 2022-10-07 MED ORDER — POTASSIUM CHLORIDE 10 MEQ/100ML IV SOLN
10.0000 meq | INTRAVENOUS | Status: AC
  Administered 2022-10-07 (×4): 10 meq via INTRAVENOUS
  Filled 2022-10-07 (×4): qty 100

## 2022-10-07 MED ORDER — ORAL CARE MOUTH RINSE: 15.0000 mL | OROMUCOSAL | Status: DC | PRN

## 2022-10-07 MED ORDER — MORPHINE SULFATE (PF) 2 MG/ML IV SOLN: 1.0000 mg | INTRAVENOUS | Status: DC | PRN

## 2022-10-07 NOTE — TOC Progression Note (Addendum)
Transition of Care [facility]) - Progression Note    Patient Details  Name: [name] MRN: [id number] Date of Birth: [birth year]  Transition [facility] CM/SW Contact  Lavenia Atlas, RN Phone Number: 10/07/2022, 12:46 PM  Clinical Narrative:    Received [facility] consult for hospice informational meeting. This RNCM spoke with patient and his wife at bedside to offer choice ([email or web address] for home hospice services. This RNCM explained the hospice referral process, patient and wife advised of understanding. This RN CM provided her contact information to call after review, to provide selection.  TOC will continue follow.     - 3:12pm This RNCM received secure chat from bedside RN re: patient choice for home hospice. This RNCM spoke with patient's wife [name] who chose [facility] for home hospice services.  This RNCM notified Shanita with [facility] re: home hospice referral and need for hospital bed. Shanita w/ [facility] will contact patient and wife.   -3:58pm This RNCM received report from [facility] who will follow patient for home hospice. [facility] has ordered DME, plans to deliver hospital bed and overbed table tomorrow prior to discharge.  RN, MD, NP notified.  TOC will continue to follow  -5:50pm This RNCM spoke with patient's wife at bedside who reports patient will also need a bedside commode. Paitent's wife inquired about transportation at discharge, this RNCM advised patient's wife that patient will be transported via ambulance once all DME has been delivered.   This RNCM notified [facility] referral line and secure chat to [facility] of additional request. Pt DME needs: hospital bed, overbed table, bedside commode.  - 6:00pm Per [facility] with [facility], bedside commode has been ordered and my not arrive tomorrow, will be delivered later. This RNCM notified patient's wife that hospital bed, overbed table has been ordered and should be delivered tomorrow, however the bedside commode will be delivered later due to DME had  already been ordered.  Patient and wife are in agreement with receiving bedside commode later.   TOC will continue to follow.   Expected Discharge Plan:  (currently unknown) Barriers to Discharge: Continued Medical Work up  Expected Discharge Plan and Services In-house Referral: Hospice / Palliative Care Discharge Planning Services: CM Consult Post Acute Care Choice: NA Living arrangements for the past 2 months: Single Family Home                 DME Arranged: N/A DME Agency: NA       HH Arranged: NA HH Agency: NA         Social Determinants of Health (SDOH) Interventions SDOH Screenings   Food Insecurity: No Food Insecurity (10/03/2022)  Housing: Low Risk  (10/03/2022)  Transportation Needs: No Transportation Needs (10/03/2022)  Utilities: Not At Risk (10/03/2022)  Tobacco Use: Medium Risk (10/03/2022)    Readmission Risk Interventions    10/06/2022    3:19 PM  Readmission Risk Prevention Plan  Transportation Screening Complete  PCP or Specialist Appt within 3-5 Days Complete  HRI or Home Care Consult Complete  Social Work Consult for Recovery Care Planning/Counseling Complete  Palliative Care Screening Complete  Medication Review [employer]) Complete

## 2022-10-07 NOTE — Progress Notes (Signed)
Speech Language Pathology Discharge Patient Details Name: [name] MRN: [id number] DOB: 1941-04-03 Today's Date: 10/07/2022 Time:  -     Patient discharged from SLP services secondary to medical decline - will need to re-order SLP to resume therapy services.  Please see latest therapy progress note for current level of functioning and progress toward goals.    Progress and discharge plan discussed with patient and/or caregiver:   Rolena Infante, MS [facility] SLP Acute Rehab Services Office [phone]  Chales Abrahams 10/07/2022, 5:03 PM

## 2022-10-07 NOTE — Progress Notes (Signed)
Physical Therapy Treatment Patient Details Name: [name] MRN: [id number] DOB: 10-03-1940 Today's Date: 10/07/2022   History of Present Illness 82 yo male presents to therapy s/p admission to hospital on 10/03/2022 due to fever, productive cough and generalized weakness. Pt has significant hx of squamous cell cancer of R lung, underwent chemoradiation completing course 3/21 with partial response. Pt recently seen by pulmonologist and on prednisone taper and 2 L/min continuous supplemental O2. Swallow study indicates evidence of small volume aspiration and suspicious for upper esophageal stricture. Upon presentation to ED blood glucose levels 565 mg/dL and sepsis work up due to possible PNA and started ABX. 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    PT Comments    Pt seen in ICU RM # [id number] Spouse present.  Pt already OOB in recliner.  On 8 HFNC at 100% BP is low 85/48 with HR 70 First performed some B LE TE's.  Pt tolerated well.  Next, assisted with standing to take another BP which was 133/ 74.  Assisted to seated to rest.  Then assisted with amb.  General Gait Details: distance limited by dyspnea RR increased from 28 to 42.  Pt currently on 8 HFNC sats 100% with HR 92.  "I could have walked more" stated pt.  Limited by Therpaist due to increased WOB.  Recliner pulled to pt.  Pt positioned to comfort.  BP post activity was 101/ 58 100% sats at 8 HFNC. Pt plans to D/C to home with Spouse.  Will need either a Rollator or a regular RW.  TBD closer to D/C date.   Recommendations for follow up therapy are one component of a multi-disciplinary discharge planning process, led by the attending physician.  Recommendations may be updated based on patient status, additional functional criteria and insurance authorization.  Follow Up  Recommendations       Assistance Recommended at Discharge Intermittent Supervision/Assistance  Patient can return home with the following A little help with walking and/or transfers;A little help with bathing/dressing/bathroom;Assistance with cooking/housework;Assist for transportation;Help with stairs or ramp for entrance   Equipment Recommendations  Rolling walker (2 wheels);Rollator (4 wheels)    Recommendations for Other Services       Precautions / Restrictions Precautions Precautions: Fall Precaution Comments: Hx Lung CA 2 lts home oxygen Restrictions Weight Bearing Restrictions: No     Mobility  Bed Mobility               General bed mobility comments: OOB in recliner    Transfers Overall transfer level: Needs assistance Equipment used: Rolling walker (2 wheels) Transfers: Sit to/from Stand Sit to Stand: Min assist           General transfer comment: + 2 for safety (multiple lines) rise from recliner    Ambulation/Gait Ambulation/Gait assistance: Min assist, +2 physical assistance, +2 safety/equipment Gait Distance (Feet): 5 Feet Assistive device: Rolling walker (2 wheels) Gait Pattern/deviations: Step-through pattern, Decreased stride length Gait velocity: decreased     General Gait Details: distance limited by dyspnea RR increased from 28 to 42.  Pt currently on 8 HFNC sats 100% with HR 92.  "I could have walked more" stated pt.  Limited by Therpaist due to increased WOB.   Stairs             Wheelchair Mobility    Modified Rankin (  Stroke Patients Only)       Balance                                            Cognition Arousal/Alertness: Awake/alert Behavior During Therapy: WFL for tasks assessed/performed Overall Cognitive Status: Within Functional Limits for tasks assessed                                 General Comments: AxO x 3 very pleasant and willing.  Following all commands.  Motivated.   "I could have walked more".        Exercises      General Comments        Pertinent Vitals/Pain Pain Assessment Pain Assessment: No/denies pain    Home Living                          Prior Function            PT Goals (current goals can now be found in the care plan section) Progress towards PT goals: Progressing toward goals    Frequency    Min 1X/week      PT Plan Current plan remains appropriate    Co-evaluation              AM-PAC PT "6 Clicks" Mobility   Outcome Measure  Help needed turning from your back to your side while in a flat bed without using bedrails?: A Lot Help needed moving from lying on your back to sitting on the side of a flat bed without using bedrails?: A Lot Help needed moving to and from a bed to a chair (including a wheelchair)?: A Lot Help needed standing up from a chair using your arms (e.g., wheelchair or bedside chair)?: A Lot Help needed to walk in hospital room?: A Lot Help needed climbing 3-5 steps with a railing? : A Lot 6 Click Score: 12    End of Session Equipment Utilized During Treatment: Gait belt;Oxygen Activity Tolerance: Patient tolerated treatment well Patient left: in chair;with call bell/phone within reach;with chair alarm set;with family/visitor present Nurse Communication: Mobility status PT Visit Diagnosis: Unsteadiness on feet (R26.81);Muscle weakness (generalized) (M62.81)     Time: 0981-1914 PT Time Calculation (min) (ACUTE ONLY): 26 min  Charges:  $Gait Training: 8-22 mins $Therapeutic Exercise: 8-22 mins                     Felecia Shelling  PTA Acute  Rehabilitation Services Office M-F          [phone]

## 2022-10-07 NOTE — Progress Notes (Signed)
eLink Physician-Brief Progress Note Patient Name: [name] DOB: 1940/05/23 MRN: [id number]   Date of Service  10/07/2022  HPI/Events of Note  Notified of abnormal labs.  Patient hypokalemic.  eICU Interventions  Repleted per protocol.        Carilyn Goodpasture 10/07/2022, 6:38 AM

## 2022-10-07 NOTE — Progress Notes (Signed)
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 At this time patient is suffering from hypoxemic respiratory failure.    Palliative care has been asked to get involved for additional conversations related to goals of care.   Today's Discussion 10/07/2022  *Please note that this is a verbal dictation therefore any spelling or grammatical errors are due to the "Dragon Medical One" system interpretation.  Chart reviewed inclusive of vital signs, progress notes, laboratory results, and diagnostic images.   I met with [name] and his spouse, [name] at bedside this morning. [name] is awake and alert. I shared my role as a palliative provider and the ongoing conversation/support I am hopeful to provide to [name] and his family during this time.  A review of [name]'s hospitalization was held. He is aware of his tenuous health state. They were able to share with me the idea of quality of time over quantity of time on earth. I expressed that perhaps it would be worthwhile to meet wit hospice to better understand their services. Patient and his wife are in agreement.  Dr. Delton Coombes did enter the room and discuss with family that his acute issues seem to have improved for now. Hospice from his perspective is a very reasonable option.   [name] did take about three sips of water while I was ate bedside. He was noted to have coughing thereafter and increased O2 needs. I shared my ongoing concern for aspirational risk. He and his wife are aware. I shared very openly that we are in a place now where if he did want to go home with hospice it may very much be a possibility though in a day or two anything can happen and that chance may pass. He understood.   Confirmed the  plan at this time to continue present care and meet with hospice for informational purposes.   Questions and concerns addressed/Palliative Support Provided.   Objective Assessment: Vital Signs Vitals:   10/07/22 0805 10/07/22 0900  BP: 121/78 123/75  Pulse: 75 74  Resp: 18 (!) 25  Temp: (!) 97.5 F (36.4 C)   SpO2: (!) 88% 100%    Intake/Output Summary (Last 24 hours) at 10/07/2022 1220 Last data filed at 10/07/2022 1047 Gross per 24 hour  Intake 1160.28 ml  Output 600 ml  Net 560.28 ml   Last Weight  Most recent update: 10/03/2022  3:30 PM    Weight  54.4 kg (120 lb)            Gen:  Elderly AA M   HEENT: Dry mucous membranes CV: Irregular rate and rhythm  PULM: On 6LPM [place], breathing slightly tachypneic ABD: soft/nontender  EXT: (+) muscle wasting Neuro: Awake and alert x3  SUMMARY OF RECOMMENDATIONS   DNAR/DNI   Allowing time for outcomes --> patient and spouse aware of  the likelihood for further deterioration   Appreciate TOC --> Hospice informational meeting with patient and his wife to better elaborate on services  Ongoing PMT support   Billing based on MDM: High ______________________________________________________________________________________ [name] Palliative Medicine Team Team Cell Phone: [phone] Please utilize secure chat with additional questions, if there is no response within 30 minutes please call the above phone number  Palliative Medicine Team providers are available by phone from 7am to 7pm daily  and can be reached through the team cell phone.  Should this patient require assistance outside of these hours, please call the patient's attending physician.

## 2022-10-07 NOTE — Progress Notes (Signed)
Pt currently on 10L HFNC at t his time.

## 2022-10-07 NOTE — Progress Notes (Signed)
[facility] [facility]) [facility] Liaison Note  Referral received from [facility] Worthy Rancher Troxler regarding patient/family interest in hospice services at home after discharge.  Spoke to patient's wife, [name], to educate on hospice services, hospice philosophy and team approach to care. [name] verbalized understanding and would like to move forward in setting up [facility] hospice care for after discharge.  Per wife, discharge date is tentative for tomorrow.  Patient address and contact information verified as correct in the chart.  Wife states there is oxygen in the home through [employer].  Request for hospital bed and overbed table, tentative delivery for tomorrow morning.  Please contact [facility] liaison to confirm delivery prior to discharge.  TOC is aware.  Wife has [facility] contact information.  Please send signed and completed DNR with patient/family at discharge.  Please send prescriptions for comfort medications with patient at discharge.  Thank you for the opportunity to participate in this patient's care.  Please call with any hospice related questions or concerns.  Doreatha Martin, RN, BSN [facility] Liaison [phone]

## 2022-10-07 NOTE — Plan of Care (Signed)
Note plan of care to discharge with home hospice  Talked with pt and wife re remaining hospital care. Request is to de-escalate some measures, continue others until dc (est dc tbd)  -will cont zosyn, lasix, steroids, pulm tx  -dc labs, dvt ppx, gi ppx, insulin, antihypertensive etc -de-escalate VS to qshift   -incr range of PRN morphine  -talked about diet-- he is enjoying thickened liquids, talked to pt.wife about this and about what quality of food might be most comfortable. Talked also about pleasure feeds -appreciate [facility] TOC and PCM support in facilitating home DME     Tessie Fass MSN, AGACNP-BC [facility] Pulmonary/Critical Care Medicine 10/07/2022, 4:15 PM

## 2022-10-07 NOTE — Progress Notes (Signed)
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  Workup has revealed acute on chronic hypoxemic respiratory failure thought multifactorial with aspiration, radiation pneumonitis flare leading differential.  Over the past day has developed worsening oxygen requirements and some concern for volume overload.  Multiple rapid responses called overnight for hypoxemia.  The patient is transferred to the ICU for initiation of BiPAP.  Of note the patient is DNR.  Wife is at bedside to corroborate history.  States he initially improved and then got worse yesterday.  Mental status is also worsened over the past 24 hours.  Has been having some failure to thrive at home attributed to the radiation pneumonitis and dysphagia.  Recent esophagram with possible stricture, planned for EGD this aftenoon which I don't think is going to be safe unfortunately.  Patient is currently breathing in the 30s to 40s on BiPAP, anxious, clearly air hungry.  Pertinent  Medical History  Lung cancer history which can be reviewed in Dr. Asa Lente note from 09/01/2022  Significant Hospital Events: Including procedures, antibiotic start and stop dates in addition to other pertinent events   5/27 admit 5/30 ICU transfer, bipap. Palli consult 5/31 remains on bipap  Interim History / Subjective:  Remains on bipap  Objective   Blood pressure 123/75, pulse 74, temperature (!) 97.5 F (36.4 C), temperature source Axillary, resp. rate (!) 25, height 5\' 11"  (1.803 m), weight 54.4 kg, SpO2 100 %.    FiO2 (%):  [45 %-100 %] 45 %   Intake/Output  Summary (Last 24 hours) at 10/07/2022 1032 Last data filed at 10/07/2022 0915 Gross per 24 hour  Intake 976.42 ml  Output 1050 ml  Net -73.58 ml   Filed Weights   10/03/22 1530  Weight: 54.4 kg    Examination: General: chronically ill frail cachectic M HENT: poor dentition, temporal muscle wasting Lungs: Symmetrical chest expansion Cardiovascular: rr  Abdomen: thin  Extremities: decr muscle mass symmetrically  Neuro:  Skin:   Patient Lines/Drains/Airways Status     Active Line/Drains/Airways     Name Placement date Placement time Site Days   Implanted Port 06/30/22 Right Chest 06/30/22  1545  Chest  98   Peripheral IV 10/03/22 20 G Left Antecubital 10/03/22  1545  Antecubital  3   External Urinary Catheter 10/03/22  2100  --  3            Resolved Hospital Problem list   N/A  Assessment & Plan:   AoC hypoxic respiratory failure Stg IIIb squamous cell carcinoma of lung  Mod COPD  Dysphagia  Esophageal stricture, radiation induced  Radiation pneumonitis Hx DVT  Hypokalemia  Anemia  FTT in adult Severe protein calorie malnutrition  DNR status  P -continues on BiPAP -- wean as tolerated, hope for HHFNC today  -vanc zosyn --- consider dc vanc  - Lasix, steroids -IV K -- poor swallowing mechanics  -pending clinical trajectory maybe eventual EGD -GOC ongoing. Is DNR. Have encouraged consideration of pt values, quality of life etc.    Best Practice (right click and "Reselect all SmartList Selections" daily)   Diet/type: NPO DVT prophylaxis: LMWH GI  prophylaxis: N/A Lines: Central line and yes and it is still needed -- port  Foley:  N/A Code Status:  DNR Last date of multidisciplinary goals of care discussion [pending]  Labs   CBC: Recent Labs  Lab 10/03/22 1543 10/04/22 0500 10/05/22 0500 10/06/22 0618 10/07/22 0540  WBC 11.0* 10.8* 8.2 6.5 9.8  NEUTROABS 10.0*  --   --   --   --   HGB 10.1* 10.2* 9.2* 8.7* 9.3*  HCT 33.0* 32.6* 30.5* 29.3*  30.1*  MCV 89.7 88.6 89.4 89.6 89.1  PLT 265 252 223 170 204    Basic Metabolic Panel: Recent Labs  Lab 10/03/22 1543 10/04/22 0500 10/05/22 0500 10/05/22 1750 10/06/22 0618 10/06/22 0806 10/06/22 1839 10/07/22 0540  NA 145 146* 146*  --  142  --   --  141  K 3.9 2.9* 3.1*  --  2.9*  --  4.2 3.3*  CL 108 111 111  --  109  --   --  108  CO2 25 29 28   --  26  --   --  26  GLUCOSE 599* 74 123* 444* 160*  --   --  183*  BUN 35* 23 24*  --  22  --   --  27*  CREATININE 1.22 0.79 0.83  --  0.93  --   --  1.15  CALCIUM 9.3 9.1 8.8*  --  8.2*  --   --  8.6*  MG  --  1.8 1.8  --   --  1.6* 2.9*  --   PHOS  --   --  2.7  --   --   --   --   --    GFR: Estimated Creatinine Clearance: 40.1 mL/min (by C-G formula based on SCr of 1.15 mg/dL). Recent Labs  Lab 10/03/22 1540 10/03/22 1543 10/03/22 1839 10/04/22 0500 10/05/22 0500 10/06/22 0618 10/07/22 0540  PROCALCITON  --   --   --  0.46  --   --   --   WBC  --    < >  --  10.8* 8.2 6.5 9.8  LATICACIDVEN 2.5*  --  1.9  --   --   --   --    < > = values in this interval not displayed.    Liver Function Tests: Recent Labs  Lab 10/03/22 1543 10/05/22 0500  AST 16 21  ALT 14 14  ALKPHOS 70 48  BILITOT 0.7 0.5  PROT 7.6 6.6  ALBUMIN 2.4* 1.9*   No results for input(s): "LIPASE", "AMYLASE" in the last 168 hours. No results for input(s): "AMMONIA" in the last 168 hours.  ABG    Component Value Date/Time   PHART 7.347 (L) 11/20/2013 0045   PCO2ART 43.4 11/20/2013 0045   PO2ART 100.0 11/20/2013 0045   HCO3 23.7 11/20/2013 0045   TCO2 23 11/20/2013 1808   ACIDBASEDEF 2.0 11/20/2013 0045   O2SAT 97.0 11/20/2013 0045     Coagulation Profile: Recent Labs  Lab 10/03/22 1543  INR 1.1    Cardiac Enzymes: No results for input(s): "CKTOTAL", "CKMB", "CKMBINDEX", "TROPONINI" in the last 168 hours.  HbA1C: Hgb A1c MFr Bld  Date/Time Value Ref Range Status  10/04/2022 05:00 AM 11.4 (H) 4.8 - 5.6 % Final    Comment:     (NOTE)         Prediabetes: 5.7 - 6.4         Diabetes: >6.4  Glycemic control for adults with diabetes: <7.0   06/03/2022 02:43 AM 6.8 (H) 4.8 - 5.6 % Final    Comment:    (NOTE) Pre diabetes:          5.7%-6.4%  Diabetes:              >6.4%  Glycemic control for   <7.0% adults with diabetes     CBG: Recent Labs  Lab 10/06/22 1644 10/06/22 1932 10/06/22 2211 10/06/22 2331 10/07/22 0403  GLUCAP 161* 199* 244* 263* 183*    CCT: n/a   Tessie Fass MSN, AGACNP-BC  Pulmonary/Critical Care Medicine Amion for pager  10/07/2022, 10:32 AM

## 2022-10-08 DIAGNOSIS — C3491 Malignant neoplasm of unspecified part of right bronchus or lung: Secondary | ICD-10-CM

## 2022-10-08 DIAGNOSIS — E1165 Type 2 diabetes mellitus with hyperglycemia: Secondary | ICD-10-CM | POA: Diagnosis not present

## 2022-10-08 DIAGNOSIS — J441 Chronic obstructive pulmonary disease with (acute) exacerbation: Secondary | ICD-10-CM | POA: Diagnosis not present

## 2022-10-08 DIAGNOSIS — J700 Acute pulmonary manifestations due to radiation: Secondary | ICD-10-CM

## 2022-10-08 DIAGNOSIS — J690 Pneumonitis due to inhalation of food and vomit: Secondary | ICD-10-CM | POA: Diagnosis not present

## 2022-10-08 DIAGNOSIS — J9621 Acute and chronic respiratory failure with hypoxia: Secondary | ICD-10-CM

## 2022-10-08 DIAGNOSIS — R6510 Systemic inflammatory response syndrome (SIRS) of non-infectious origin without acute organ dysfunction: Secondary | ICD-10-CM | POA: Diagnosis not present

## 2022-10-08 LAB — CULTURE, BLOOD (ROUTINE X 2)
Culture: NO GROWTH
Special Requests: ADEQUATE

## 2022-10-08 MED ORDER — HYDROCOD POLI-CHLORPHE POLI ER 10-8 MG/5ML PO SUER: 5.0000 mL | Freq: Two times a day (BID) | ORAL | Status: DC | PRN

## 2022-10-08 MED ORDER — HEPARIN SOD (PORK) LOCK FLUSH 100 UNIT/ML IV SOLN
500.0000 [IU] | INTRAVENOUS | Status: AC | PRN
  Administered 2022-10-08: 500 [IU]

## 2022-10-08 MED ORDER — PREDNISONE 10 MG PO TABS: 20.0000 mg | ORAL_TABLET | Freq: Every day | ORAL | 0 refills | Status: DC

## 2022-10-08 MED ORDER — AMOXICILLIN-POT CLAVULANATE 875-125 MG PO TABS
1.0000 | ORAL_TABLET | Freq: Two times a day (BID) | ORAL | Status: DC
  Administered 2022-10-08: 1 via ORAL
  Filled 2022-10-08: qty 1

## 2022-10-08 MED ORDER — REVEFENACIN 175 MCG/3ML IN SOLN: 175.0000 ug | Freq: Every day | RESPIRATORY_TRACT | Status: DC

## 2022-10-08 MED ORDER — ALBUTEROL SULFATE (2.5 MG/3ML) 0.083% IN NEBU: 2.5000 mg | INHALATION_SOLUTION | RESPIRATORY_TRACT | Status: DC | PRN

## 2022-10-08 MED ORDER — PREDNISONE 20 MG PO TABS: 20.0000 mg | ORAL_TABLET | Freq: Every day | ORAL | Status: DC

## 2022-10-08 MED ORDER — HYDROCOD POLI-CHLORPHE POLI ER 10-8 MG/5ML PO SUER: 5.0000 mL | Freq: Two times a day (BID) | ORAL | 0 refills | Status: DC | PRN

## 2022-10-08 MED ORDER — FLUTICASONE-SALMETEROL 230-21 MCG/ACT IN AERO: 2.0000 | INHALATION_SPRAY | Freq: Two times a day (BID) | RESPIRATORY_TRACT | 2 refills | Status: DC

## 2022-10-08 MED ORDER — AMOXICILLIN-POT CLAVULANATE 875-125 MG PO TABS: 1.0000 | ORAL_TABLET | Freq: Two times a day (BID) | ORAL | 0 refills | Status: AC

## 2022-10-08 NOTE — TOC Transition Note (Addendum)
Transition of Care [facility]) - CM/SW Discharge Note   Patient Details  Name: [name] MRN: [id number] Date of Birth: [birth year]  Transition of Care [facility] CM/SW Contact:  Georgie Chard, LCSW Phone Number: 10/08/2022, 12:22 PM   Clinical Narrative:    [name] operator number is [phone]   Patient is expected to DC from the hospital. Today Per RNCM notes all is set up for the patient to return home This CSW has made the nurse aware. This CSW attempted to call wife no V/M was left as it just kept ringing. CSW will try again. At this time [facility] will help arrange transport.  Barriers to Discharge: Continued Medical Work up   Patient Goals and CMS Choice CMS Medicare.gov Compare Post Acute Care list provided to:: Patient Represenative (must comment) (wife: [name]) Choice offered to / list presented to : Spouse  Discharge Placement                         Discharge Plan and Services Additional resources added to the After Visit Summary for   In-house Referral: Hospice / Palliative Care Discharge Planning Services: CM Consult Post Acute Care Choice: NA          DME Arranged: N/A DME Agency: NA       HH Arranged: NA HH Agency: NA        Social Determinants of Health (SDOH) Interventions SDOH Screenings   Food Insecurity: No Food Insecurity (10/03/2022)  Housing: Low Risk  (10/03/2022)  Transportation Needs: No Transportation Needs (10/03/2022)  Utilities: Not At Risk (10/03/2022)  Tobacco Use: Medium Risk (10/03/2022)     Readmission Risk Interventions    10/06/2022    3:19 PM  Readmission Risk Prevention Plan  Transportation Screening Complete  PCP or Specialist Appt within 3-5 Days Complete  HRI or Home Care Consult Complete  Social Work Consult for Recovery Care Planning/Counseling Complete  Palliative Care Screening Complete  Medication Review [employer]) Complete

## 2022-10-08 NOTE — Progress Notes (Signed)
PMT no charge note.   Chart reviewed, note plans for d/c home with hospice support, as per initial PMT discussions at the time of consult on 10-07-22. Agree with home based hospice, no new PMT specific recommendations at this time.  No charge Rosalin Hawking MD [place] [facility].

## 2022-10-08 NOTE — Progress Notes (Signed)
Orders for DC home. IV team de-accessed chest pot. AVS papers were printed and given to the wife. All questions answered.  Bath given prior to DC per request. New sacrum foam applied. Glasses, dentures and clothes with the patient. PTAR at the bedside. PIV removed and intact. VS stable.

## 2022-10-08 NOTE — Progress Notes (Signed)
[facility] PROGRESS NOTE    Progress Note  [name]  ZOX:[id number] DOB: [birth year] DOA: 10/03/2022 PCP: Darrow Bussing, MD     Brief Narrative:   [name] is an 82 y.o. 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  The patient is currently DNR, on empiric antibiotics for suspected aspiration pneumonia as a cause of decompensation.  Palliative care was consulted spoke to the wife and they would like to move forward with the setting hospice care upon discharge.  Significant events: 5/27 admit 5/30 ICU transfer, bipap. Palli consult 5/31 remains on bipap   Assessment/Plan:   SIRS (systemic inflammatory response syndrome) (HCC)/acute on chronic respiratory failure with hypoxia in setting of radiation pneumonitis/stage IIIb squamous cell carcinoma of the lung/dysphagia aspiration pneumonia: Currently on high flow nasal cannula, 15 L. On empiric antibiotics IV Zosyn. He is currently on IV steroids and Lasix he is about even on his I's and O's. Goals of care has happened he is a DNR/DNI moving towards hospice as an outpatient. Currently requested to DC all labs PPI GI prophylaxis insulin and hypertensive medication. Vital signs de-escalated to every shift.  Type 2 diabetes mellitus with hyperglycemia, without long-term current use of insulin (HCC) Of insulin no further CBGs.  Dysphagia  Squamous cell carcinoma of lung (HCC)/  Moderate COPD (chronic obstructive pulmonary disease) (HCC) Noted  Hypertension associated with diabetes (HCC) All antihypertensive  medications were DC'd.  Severe protein-calorie malnutrition (HCC)/ FTT (failure to thrive) in adult Noted   DVT prophylaxis: none Family Communication:wife Status is: Inpatient Remains inpatient appropriate because: Acute respiratory failure with hypoxia    Code Status:     Code Status Orders  (From admission, onward)           Start     Ordered   10/03/22 1926  Do not attempt resuscitation (DNR)  Continuous       Question Answer Comment  If patient has no pulse and is not breathing Do Not Attempt Resuscitation   If patient has a pulse and/or is breathing: Medical Treatment Goals LIMITED ADDITIONAL INTERVENTIONS: Use medication/IV fluids and cardiac monitoring as indicated; Do not use intubation or mechanical ventilation (DNI), also provide comfort medications.  Transfer to Progressive/Stepdown as indicated, avoid Intensive Care.   Consent: Discussion documented in EHR or advanced directives reviewed      10/03/22 1927           Code Status History     Date Active Date Inactive Code Status Order ID Comments User Context   06/30/2022 1620 07/01/2022 0514 Full Code [id number]  Richarda Overlie, MD [facility]   06/03/2022 0113 06/06/2022 1543 Full Code [id number]  Gleason, Markus Jarvis [facility]   11/19/2013 1149 11/21/2013 1245 Full Code [id number]  Ardelle Balls, PA-C Inpatient   10/01/2013 1022 10/01/2013 1426 Full Code [id number]  Jake Bathe, MD Inpatient   04/23/2013 0040 04/24/2013 1718 Full Code [id number]  Lynden Oxford, MD Inpatient      Advance Directive Documentation    Flowsheet Row Most Recent Value  Type of Advance Directive Healthcare Power of Attorney, Living will  Pre-existing out of facility DNR order (yellow form or pink  MOST form) --  "MOST" Form in Place? --         IV Access:   Peripheral IV   Procedures and diagnostic studies:   VAS [place] LOWER EXTREMITY VENOUS (DVT)  Result Date: 10/06/2022  Lower Venous DVT Study Patient Name:  [name]  Date  of Exam:   10/06/2022 Medical Rec #: [id number]       Accession #:    [id number] Date of Birth: September 05, 1940        Patient Gender: M Patient Age:   31 years Exam Location:  [facility] Procedure:      VAS [place] LOWER EXTREMITY VENOUS (DVT) Referring Phys: Levon Hedger --------------------------------------------------------------------------------  Indications: Edema.  Risk Factors: DVT -2012 RLE. Limitations: Patient positioning. Comparison Study: Previous RLE 05/17/10 positive.                   No previous LLE. Performing Technologist: McKayla Maag RVT, [place]  Examination Guidelines: A complete evaluation includes B-mode imaging, spectral Doppler, color Doppler, and power Doppler as needed of all accessible portions of each vessel. Bilateral testing is considered an integral part of a complete examination. Limited examinations for reoccurring indications may be performed as noted. The reflux portion of the exam is performed with the patient in reverse Trendelenburg.  +---------+---------------+---------+-----------+----------+--------------+ RIGHT    CompressibilityPhasicitySpontaneityPropertiesThrombus Aging +---------+---------------+---------+-----------+----------+--------------+ CFV      Full           Yes      Yes                                 +---------+---------------+---------+-----------+----------+--------------+ SFJ      Full                                                        +---------+---------------+---------+-----------+----------+--------------+ FV Prox  Full                                                        +---------+---------------+---------+-----------+----------+--------------+ FV Mid   Full                                                        +---------+---------------+---------+-----------+----------+--------------+ FV DistalFull                                                         +---------+---------------+---------+-----------+----------+--------------+ PFV      Full                                                        +---------+---------------+---------+-----------+----------+--------------+ POP  Not visualized +---------+---------------+---------+-----------+----------+--------------+ PTV      Full                                                        +---------+---------------+---------+-----------+----------+--------------+ PERO     Full                                                        +---------+---------------+---------+-----------+----------+--------------+   +---------+---------------+---------+-----------+----------+--------------+ LEFT     CompressibilityPhasicitySpontaneityPropertiesThrombus Aging +---------+---------------+---------+-----------+----------+--------------+ CFV      Full           Yes      Yes                                 +---------+---------------+---------+-----------+----------+--------------+ SFJ      Full                                                        +---------+---------------+---------+-----------+----------+--------------+ FV Prox  Full                                                        +---------+---------------+---------+-----------+----------+--------------+ FV Mid   Full                                                        +---------+---------------+---------+-----------+----------+--------------+ FV DistalFull                                                        +---------+---------------+---------+-----------+----------+--------------+ PFV      Full                                                        +---------+---------------+---------+-----------+----------+--------------+ POP                                                   Not visualized  +---------+---------------+---------+-----------+----------+--------------+ PTV      Full                                                        +---------+---------------+---------+-----------+----------+--------------+  PERO     Full                                                        +---------+---------------+---------+-----------+----------+--------------+     Summary: RIGHT: - There is no evidence of deep vein thrombosis in the lower extremity. However, portions of this examination were limited- see technologist comments above.  - No cystic structure found in the popliteal fossa.  LEFT: - There is no evidence of deep vein thrombosis in the lower extremity. However, portions of this examination were limited- see technologist comments above.  - No cystic structure found in the popliteal fossa.  *See table(s) above for measurements and observations. Electronically signed by Heath Lark on 10/06/2022 at 5:41:29 PM.    Final    DG Chest Port 1 View  Result Date: 10/06/2022 CLINICAL DATA:  Acute respiratory distress. EXAM: PORTABLE CHEST 1 VIEW COMPARISON:  Chest x-ray May 28 214. FINDINGS: In comparison to May 27, 24 chest x-ray, increased bilateral interstitial opacities and cavitary consolidation in the right lung apex. Findings better characterized on recent CT of the chest Oct 04, 2022. cardiomediastinal silhouette is unchanged. Similar position of right IJ Port-A-Cath. Bilateral shoulder degenerative change. Median sternotomy. Cardiac valve replacement. IMPRESSION: In comparison to May 27, 24 chest x-ray, increased bilateral interstitial opacities and cavitary consolidation in the right lung apex. Findings better characterized on recent CT of the chest Oct 04, 2022. This could represent worsening infection and/or edema. Electronically Signed   By: Feliberto Harts M.D.   On: 10/06/2022 08:16     Medical Consultants:   None.   Subjective:    [name] no complaints  breathing is comfortable  Objective:    Vitals:   10/08/22 0300 10/08/22 0400 10/08/22 0500 10/08/22 0600  BP:      Pulse: 72 69 71 70  Resp: (!) 21 16 20  (!) 23  Temp:      TempSrc:      SpO2: 97% 98% 99% 96%  Weight:      Height:       SpO2: 96 % O2 Flow Rate (L/min): 15 L/min FiO2 (%): 45 %   Intake/Output Summary (Last 24 hours) at 10/08/2022 0657 Last data filed at 10/07/2022 1837 Gross per 24 hour  Intake 966.64 ml  Output 700 ml  Net 266.64 ml   Filed Weights   10/03/22 1530  Weight: 54.4 kg    Exam: General exam: In no acute distress, cachectic Respiratory system: Moderate air movement with diffuse crackles bilaterally Cardiovascular system: S1 & S2 heard, RRR. No JVD. Gastrointestinal system: Abdomen is nondistended, soft and nontender.  Extremities: No pedal edema. Skin: No rashes, lesions or ulcers   Data Reviewed:    Labs: Basic Metabolic Panel: Recent Labs  Lab 10/03/22 1543 10/04/22 0500 10/05/22 0500 10/05/22 1750 10/06/22 0618 10/06/22 0806 10/06/22 1839 10/07/22 0540  NA 145 146* 146*  --  142  --   --  141  K 3.9 2.9* 3.1*  --  2.9*  --  4.2 3.3*  CL 108 111 111  --  109  --   --  108  CO2 25 29 28   --  26  --   --  26  GLUCOSE 599* 74 123* 444* 160*  --   --  183*  BUN 35* 23 24*  --  22  --   --  27*  CREATININE 1.22 0.79 0.83  --  0.93  --   --  1.15  CALCIUM 9.3 9.1 8.8*  --  8.2*  --   --  8.6*  MG  --  1.8 1.8  --   --  1.6* 2.9*  --   PHOS  --   --  2.7  --   --   --   --   --    GFR Estimated Creatinine Clearance: 40.1 mL/min (by C-G formula based on SCr of 1.15 mg/dL). Liver Function Tests: Recent Labs  Lab 10/03/22 1543 10/05/22 0500  AST 16 21  ALT 14 14  ALKPHOS 70 48  BILITOT 0.7 0.5  PROT 7.6 6.6  ALBUMIN 2.4* 1.9*   No results for input(s): "LIPASE", "AMYLASE" in the last 168 hours. No results for input(s): "AMMONIA" in the last 168 hours. Coagulation profile Recent Labs  Lab 10/03/22 1543  INR 1.1    COVID-19 Labs  No results for input(s): "DDIMER", "FERRITIN", "LDH", "CRP" in the last 72 hours.  Lab Results  Component Value Date   SARSCOV2NAA NEGATIVE 10/03/2022   SARSCOV2NAA Not Detected 05/30/2022    CBC: Recent Labs  Lab 10/03/22 1543 10/04/22 0500 10/05/22 0500 10/06/22 0618 10/07/22 0540  WBC 11.0* 10.8* 8.2 6.5 9.8  NEUTROABS 10.0*  --   --   --   --   HGB 10.1* 10.2* 9.2* 8.7* 9.3*  HCT 33.0* 32.6* 30.5* 29.3* 30.1*  MCV 89.7 88.6 89.4 89.6 89.1  PLT 265 252 223 170 204   Cardiac Enzymes: No results for input(s): "CKTOTAL", "CKMB", "CKMBINDEX", "TROPONINI" in the last 168 hours. BNP (last 3 results) No results for input(s): "PROBNP" in the last 8760 hours. CBG: Recent Labs  Lab 10/06/22 2211 10/06/22 2331 10/07/22 0403 10/07/22 0730 10/07/22 1207  GLUCAP 244* 263* 183* 142* 296*   D-Dimer: No results for input(s): "DDIMER" in the last 72 hours. Hgb A1c: No results for input(s): "HGBA1C" in the last 72 hours. Lipid Profile: No results for input(s): "CHOL", "HDL", "LDLCALC", "TRIG", "CHOLHDL", "LDLDIRECT" in the last 72 hours. Thyroid function studies: No results for input(s): "TSH", "T4TOTAL", "T3FREE", "THYROIDAB" in the last 72 hours.  Invalid input(s): "FREET3" Anemia work up: No results for input(s): "VITAMINB12", "FOLATE", "FERRITIN", "TIBC", "IRON", "RETICCTPCT" in the last 72 hours. Sepsis Labs: Recent Labs  Lab 10/03/22 1540 10/03/22 1543 10/03/22 1839 10/04/22 0500 10/05/22 0500 10/06/22 0618 10/07/22 0540  PROCALCITON  --   --   --  0.46  --   --   --   WBC  --    < >  --  10.8* 8.2 6.5 9.8  LATICACIDVEN 2.5*  --  1.9  --   --   --   --    < > = values in this interval not displayed.   Microbiology Recent Results (from the past 240 hour(s))  Culture, blood (Routine x 2)     Status: Abnormal   Collection Time: 10/03/22  3:35 PM   Specimen: BLOOD  Result Value Ref Range Status   Specimen Description   Final    BLOOD LEFT  ANTECUBITAL Performed at [facility], [address] [place], [place] [address]    Special Requests   Final    BOTTLES DRAWN AEROBIC AND ANAEROBIC Blood Culture results may not be optimal due to an inadequate volume of blood received in culture  bottles Performed at [facility], [address] [place], [place] [address]    Culture  Setup Time   Final    GRAM POSITIVE COCCI AEROBIC BOTTLE ONLY CRITICAL RESULT CALLED TO, READ BACK BY AND VERIFIED WITH: T GREEN,PHARMD@0659  10/05/22 MK    Culture (A)  Final    STAPHYLOCOCCUS EPIDERMIDIS THE SIGNIFICANCE OF ISOLATING THIS ORGANISM FROM A SINGLE SET OF BLOOD CULTURES WHEN MULTIPLE SETS ARE DRAWN IS UNCERTAIN. PLEASE NOTIFY THE MICROBIOLOGY DEPARTMENT WITHIN ONE WEEK IF SPECIATION AND SENSITIVITIES ARE REQUIRED. Performed at [facility], [address] [place], [place] [address]    Report Status 10/07/2022 FINAL  Final  Blood Culture ID Panel (Reflexed)     Status: Abnormal   Collection Time: 10/03/22  3:35 PM  Result Value Ref Range Status   Enterococcus faecalis NOT DETECTED NOT DETECTED Final   Enterococcus Faecium NOT DETECTED NOT DETECTED Final   Listeria monocytogenes NOT DETECTED NOT DETECTED Final   Staphylococcus species DETECTED (A) NOT DETECTED Final    Comment: CRITICAL RESULT CALLED TO, READ BACK BY AND VERIFIED WITH: T GREEN,PHARMD@0658  10/05/22 MK    Staphylococcus aureus (BCID) NOT DETECTED NOT DETECTED Final   Staphylococcus epidermidis DETECTED (A) NOT DETECTED Final    Comment: Methicillin (oxacillin) resistant coagulase negative staphylococcus. Possible blood culture contaminant (unless isolated from more than one blood culture draw or clinical case suggests pathogenicity). No antibiotic treatment is indicated for blood  culture contaminants. CRITICAL RESULT CALLED TO, READ BACK BY AND VERIFIED WITH: T GREEN,PHARMD@0655  10/05/22    Staphylococcus lugdunensis NOT DETECTED  NOT DETECTED Final   Streptococcus species NOT DETECTED NOT DETECTED Final   Streptococcus agalactiae NOT DETECTED NOT DETECTED Final   Streptococcus pneumoniae NOT DETECTED NOT DETECTED Final   Streptococcus pyogenes NOT DETECTED NOT DETECTED Final   A.calcoaceticus-baumannii NOT DETECTED NOT DETECTED Final   Bacteroides fragilis NOT DETECTED NOT DETECTED Final   Enterobacterales NOT DETECTED NOT DETECTED Final   Enterobacter cloacae complex NOT DETECTED NOT DETECTED Final   Escherichia coli NOT DETECTED NOT DETECTED Final   Klebsiella aerogenes NOT DETECTED NOT DETECTED Final   Klebsiella oxytoca NOT DETECTED NOT DETECTED Final   Klebsiella pneumoniae NOT DETECTED NOT DETECTED Final   Proteus species NOT DETECTED NOT DETECTED Final   Salmonella species NOT DETECTED NOT DETECTED Final   Serratia marcescens NOT DETECTED NOT DETECTED Final   Haemophilus influenzae NOT DETECTED NOT DETECTED Final   Neisseria meningitidis NOT DETECTED NOT DETECTED Final   Pseudomonas aeruginosa NOT DETECTED NOT DETECTED Final   Stenotrophomonas maltophilia NOT DETECTED NOT DETECTED Final   Candida albicans NOT DETECTED NOT DETECTED Final   Candida auris NOT DETECTED NOT DETECTED Final   Candida glabrata NOT DETECTED NOT DETECTED Final   Candida krusei NOT DETECTED NOT DETECTED Final   Candida parapsilosis NOT DETECTED NOT DETECTED Final   Candida tropicalis NOT DETECTED NOT DETECTED Final   Cryptococcus neoformans/gattii NOT DETECTED NOT DETECTED Final   Methicillin resistance mecA/C DETECTED (A) NOT DETECTED Final    Comment: CRITICAL RESULT CALLED TO, READ BACK BY AND VERIFIED WITH: T GREEN,PHARMD@0655  10/05/22 MK Performed at [facility], [address], [place], [place] [address]   Culture, blood (Routine x 2)     Status: None (Preliminary result)   Collection Time: 10/03/22  3:41 PM   Specimen: BLOOD  Result Value Ref Range Status   Specimen Description   Final    BLOOD RIGHT  ANTECUBITAL Performed at  [facility], [address], [place], [place] [address]    Special Requests   Final    BOTTLES DRAWN AEROBIC AND ANAEROBIC Blood Culture adequate volume Performed at [facility], [address] [place], [place] [address]    Culture   Final    NO GROWTH 4 DAYS Performed at [facility], [address], [place], [place] [address]    Report Status PENDING  Incomplete  Urine Culture     Status: Abnormal   Collection Time: 10/03/22  3:48 PM   Specimen: Urine, Random  Result Value Ref Range Status   Specimen Description   Final    URINE, RANDOM Performed at [facility], [address], [place], [place] [address]    Special Requests   Final    NONE Reflexed from [id number] Performed at [facility], [address], [place], [place] [address]    Culture MULTIPLE SPECIES PRESENT, SUGGEST RECOLLECTION (A)  Final   Report Status 10/05/2022 FINAL  Final  Resp panel by RT-PCR (RSV, Flu A&B, Covid) Anterior Nasal Swab     Status: None   Collection Time: 10/03/22  3:57 PM   Specimen: Anterior Nasal Swab  Result Value Ref Range Status   SARS Coronavirus 2 by RT PCR NEGATIVE NEGATIVE Final    Comment: (NOTE) SARS-CoV-2 target nucleic acids are NOT DETECTED.  The SARS-CoV-2 RNA is generally detectable in upper respiratory specimens during the acute phase of infection. The lowest concentration of SARS-CoV-2 viral copies this assay can detect is 138 copies/mL. A negative result does not preclude SARS-Cov-2 infection and should not be used as the sole basis for treatment or other patient management decisions. A negative result may occur with  improper specimen collection/handling, submission of specimen other than nasopharyngeal swab, presence of viral mutation(s) within the areas targeted by this assay, and inadequate number of viral copies(<138 copies/mL). A negative result must be  combined with clinical observations, patient history, and epidemiological information. The expected result is Negative.  Fact Sheet for Patients:  BloggerCourse.com  Fact Sheet for Healthcare Providers:  SeriousBroker.it  This test is no t yet approved or cleared by the [place] FDA and  has been authorized for detection and/or diagnosis of SARS-CoV-2 by FDA under an Emergency Use Authorization (EUA). This EUA will remain  in effect (meaning this test can be used) for the duration of the COVID-19 declaration under Section 564(b)(1) of the Act, 21 U.S.C.section 360bbb-3(b)(1), unless the authorization is terminated  or revoked sooner.       Influenza A by PCR NEGATIVE NEGATIVE Final   Influenza B by PCR NEGATIVE NEGATIVE Final    Comment: (NOTE) The Xpert Xpress SARS-CoV-2/FLU/RSV plus assay is intended as an aid in the diagnosis of influenza from Nasopharyngeal swab specimens and should not be used as a sole basis for treatment. Nasal washings and aspirates are unacceptable for Xpert Xpress SARS-CoV-2/FLU/RSV testing.  Fact Sheet for Patients: BloggerCourse.com  Fact Sheet for Healthcare Providers: SeriousBroker.it  This test is not yet approved or cleared by the [place] FDA and has been authorized for detection and/or diagnosis of SARS-CoV-2 by FDA under an Emergency Use Authorization (EUA). This EUA will remain in effect (meaning this test can be used) for the duration of the COVID-19 declaration under Section 564(b)(1) of the Act, 21 U.S.C. section 360bbb-3(b)(1), unless the authorization is terminated or revoked.     Resp Syncytial Virus by PCR NEGATIVE NEGATIVE Final  Comment: (NOTE) Fact Sheet for Patients: BloggerCourse.com  Fact Sheet for Healthcare Providers: SeriousBroker.it  This test is not yet  approved or cleared by the [place] FDA and has been authorized for detection and/or diagnosis of SARS-CoV-2 by FDA under an Emergency Use Authorization (EUA). This EUA will remain in effect (meaning this test can be used) for the duration of the COVID-19 declaration under Section 564(b)(1) of the Act, 21 U.S.C. section 360bbb-3(b)(1), unless the authorization is terminated or revoked.  Performed at [facility], [address], [place], [place] [address]   MRSA Next Gen by PCR, Nasal     Status: None   Collection Time: 10/03/22 11:18 PM   Specimen: Nasopharyngeal Swab; Nasal Swab  Result Value Ref Range Status   MRSA by PCR Next Gen NOT DETECTED NOT DETECTED Final    Comment: (NOTE) The GeneXpert MRSA Assay (FDA approved for NASAL specimens only), is one component of a comprehensive MRSA colonization surveillance program. It is not intended to diagnose MRSA infection nor to guide or monitor treatment for MRSA infections. Test performance is not FDA approved in patients less than 79 years old. Performed at [facility], [address] [place], [place] [address]   Respiratory (~20 pathogens) panel by PCR     Status: None   Collection Time: 10/03/22 11:43 PM   Specimen: Nasopharyngeal Swab; Respiratory  Result Value Ref Range Status   Adenovirus NOT DETECTED NOT DETECTED Final   Coronavirus 229E NOT DETECTED NOT DETECTED Final    Comment: (NOTE) The Coronavirus on the Respiratory Panel, DOES NOT test for the novel  Coronavirus (2019 nCoV)    Coronavirus HKU1 NOT DETECTED NOT DETECTED Final   Coronavirus NL63 NOT DETECTED NOT DETECTED Final   Coronavirus OC43 NOT DETECTED NOT DETECTED Final   Metapneumovirus NOT DETECTED NOT DETECTED Final   Rhinovirus / Enterovirus NOT DETECTED NOT DETECTED Final   Influenza A NOT DETECTED NOT DETECTED Final   Influenza B NOT DETECTED NOT DETECTED Final   Parainfluenza Virus 1 NOT DETECTED NOT  DETECTED Final   Parainfluenza Virus 2 NOT DETECTED NOT DETECTED Final   Parainfluenza Virus 3 NOT DETECTED NOT DETECTED Final   Parainfluenza Virus 4 NOT DETECTED NOT DETECTED Final   Respiratory Syncytial Virus NOT DETECTED NOT DETECTED Final   Bordetella pertussis NOT DETECTED NOT DETECTED Final   Bordetella Parapertussis NOT DETECTED NOT DETECTED Final   Chlamydophila pneumoniae NOT DETECTED NOT DETECTED Final   Mycoplasma pneumoniae NOT DETECTED NOT DETECTED Final    Comment: Performed at [facility], [address], [place], [place] [address]     Medications:    arformoterol  15 mcg Nebulization BID   budesonide (PULMICORT) nebulizer solution  0.25 mg Nebulization BID   Chlorhexidine Gluconate Cloth  6 each Topical Daily   chlorpheniramine-HYDROcodone  5 mL Oral Q12H   furosemide  40 mg Intravenous Daily   guaiFENesin  1,200 mg Oral BID   ipratropium-albuterol  3 mL Nebulization Q6H   methylPREDNISolone (SOLU-MEDROL) injection  40 mg Intravenous Q12H   mouth rinse  15 mL Mouth Rinse 4 times per day   sodium chloride flush  3 mL Intravenous Q12H   sodium chloride HYPERTONIC  4 mL Nebulization BID   Continuous Infusions:  piperacillin-tazobactam (ZOSYN)  IV 3.375 g (10/08/22 0644)      LOS: 4 days   Marinda Elk  [email or web address]  10/08/2022, 6:57 AM

## 2022-10-08 NOTE — Discharge Summary (Signed)
Physician Discharge Summary  [name]:[id number] DOB: [birth year] DOA: 10/03/2022  PCP: Darrow Bussing, MD  Admit date: 10/03/2022 Discharge date: 10/08/2022  Admitted From: Home  Disposition:  Home with Hospice   Recommendations for Outpatient Follow-up:  Hospice to follow-up at home.   Home Health:No  Equipment/Devices:Oxygen   Discharge Condition:Hospice  CODE STATUS:DNR Diet recommendation: Heart Healthy   Brief/Interim Summary: 82 y.o. 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  The patient is currently DNR, on empiric antibiotics for suspected aspiration pneumonia as a cause of decompensation.  Palliative care was consulted spoke to the wife and they would like to move forward with the setting hospice care upon discharge.   Significant events:  5/27 admit  5/30 ICU transfer, bipap. Palli consult  5/31 remains on bipap   Discharge Diagnoses:  Principal Problem:   SIRS (systemic inflammatory response syndrome) (HCC) Active Problems:   Acute hypoxemic respiratory failure (HCC)   Type 2 diabetes mellitus with hyperglycemia, without long-term current use of insulin (HCC)   Dysphagia   Squamous cell carcinoma of lung (HCC)   Moderate COPD (chronic obstructive pulmonary disease) (HCC)   Hypertension associated with diabetes (HCC)   Hyperlipidemia associated with type 2 diabetes mellitus (HCC)   Community acquired pneumonia   Severe protein-calorie malnutrition (HCC)   FTT (failure to thrive) in adult   Goals of care, counseling/discussion   Acute on  chronic respiratory failure with hypoxia (HCC)   Aspiration pneumonitis (HCC)   COPD with acute exacerbation (HCC)   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 Initially started on empiric antibiotics which are now being transition to oral Augmentin which she will continue as an outpatient. He was given trial of IV Lasix which was unsuccessful. On admission he was started on IV steroids which have now been transition to oral prednisone which she will continue as an outpatient. Goals of care has happened he is a DNR/DNI moving towards hospice as an outpatient. The wife requested to DC all labs PPI GI prophylaxis insulin and hypertensive medication. On the day of discharge the wife requested the patient to be discharged home to spend his last few days at home.   Type 2 diabetes mellitus with hyperglycemia, without long-term current use of insulin (HCC) Of insulin no further CBGs. Continue oral hypoglycemics.  Dysphagia: Comfort feeds as per wife's she relates that there is no one of the few things he still enjoys.   Squamous cell carcinoma of lung (HCC)/  Moderate COPD (chronic obstructive pulmonary disease) (HCC) Noted   Hypertension associated with diabetes (HCC) All antihypertensive medications were DC'd.   Severe protein-calorie malnutrition (HCC)/ FTT (failure to thrive) in adult Noted  Discharge Instructions  Discharge Instructions     Diet - low sodium heart healthy   Complete by: As directed    Increase activity slowly   Complete by: As directed  Allergies as of 10/08/2022       Reactions   Azithromycin Swelling   Angioedema 05/2022     Lisinopril    Angioedema 05/2022   Ezetimibe-simvastatin    Leg pains Restlessness at night    Metoprolol    bradycardia   Rosuvastatin    Discomfort, RESTLESS, CAN'T SLEEP        Medication List     STOP taking these medications    atorvastatin 20 MG tablet Commonly known as: LIPITOR   glucose blood test strip   lidocaine-prilocaine cream Commonly known as: EMLA   multivitamin with minerals Tabs tablet   SF 5000 Plus 1.1 % Crea dental cream Generic drug: sodium fluoride   sucralfate 1 g tablet Commonly known as: Carafate       TAKE these medications    acetaminophen 500 MG tablet Commonly known as: TYLENOL Take 500 mg by mouth every 6 (six) hours as needed for mild pain or moderate pain.   amLODipine 10 MG tablet Commonly known as: NORVASC Take 1 tablet (10 mg total) by mouth daily.   amoxicillin-clavulanate 875-125 MG tablet Commonly known as: AUGMENTIN Take 1 tablet by mouth every 12 (twelve) hours for 4 days.   aspirin EC 81 MG tablet Take 1 tablet (81 mg total) by mouth daily.   chlorpheniramine-HYDROcodone 10-8 MG/5ML Commonly known as: TUSSIONEX Take 5 mLs by mouth every 12 (twelve) hours as needed for cough.   fluticasone-salmeterol 230-21 MCG/ACT inhaler Commonly known as: Advair HFA Inhale 2 puffs into the lungs 2 (two) times daily.   hydrochlorothiazide 12.5 MG tablet Commonly known as: HYDRODIURIL Take 1 tablet (12.5 mg total) by mouth daily.   metFORMIN 1000 MG tablet Commonly known as: GLUCOPHAGE Take 1,000 mg by mouth 2 (two) times daily with a meal.   predniSONE 10 MG tablet Commonly known as: DELTASONE Take 2 tablets (20 mg total) by mouth daily. What changed:  how much to take how to take this when to take this additional instructions        Allergies  Allergen Reactions   Azithromycin Swelling    Angioedema 05/2022    Lisinopril     Angioedema 05/2022   Ezetimibe-Simvastatin     Leg pains Restlessness at  night     Metoprolol     bradycardia   Rosuvastatin     Discomfort, RESTLESS, CAN'T SLEEP     Consultations: Pulmonary and critical care   Procedures/Studies: VAS [place] LOWER EXTREMITY VENOUS (DVT)  Result Date: 10/06/2022  Lower Venous DVT Study Patient Name:  MAKHAI OUTING  Date of Exam:   10/06/2022 Medical Rec #: [id number]       Accession #:    [id number] Date of Birth: 1940/10/28        Patient Gender: M Patient Age:   28 years Exam Location:  [facility] Procedure:      VAS [place] LOWER EXTREMITY VENOUS (DVT) Referring Phys: Levon Hedger --------------------------------------------------------------------------------  Indications: Edema.  Risk Factors: DVT -2012 RLE. Limitations: Patient positioning. Comparison Study: Previous RLE 05/17/10 positive.                   No previous LLE. Performing Technologist: McKayla Maag RVT, [place]  Examination Guidelines: A complete evaluation includes B-mode imaging, spectral Doppler, color Doppler, and power Doppler as needed of all accessible portions of each vessel. Bilateral testing is considered an integral part of a complete examination. Limited examinations for reoccurring indications may be performed as noted. The  reflux portion of the exam is performed with the patient in reverse Trendelenburg.  +---------+---------------+---------+-----------+----------+--------------+ RIGHT    CompressibilityPhasicitySpontaneityPropertiesThrombus Aging +---------+---------------+---------+-----------+----------+--------------+ CFV      Full           Yes      Yes                                 +---------+---------------+---------+-----------+----------+--------------+ SFJ      Full                                                        +---------+---------------+---------+-----------+----------+--------------+ FV Prox  Full                                                         +---------+---------------+---------+-----------+----------+--------------+ FV Mid   Full                                                        +---------+---------------+---------+-----------+----------+--------------+ FV DistalFull                                                        +---------+---------------+---------+-----------+----------+--------------+ PFV      Full                                                        +---------+---------------+---------+-----------+----------+--------------+ POP                                                   Not visualized +---------+---------------+---------+-----------+----------+--------------+ PTV      Full                                                        +---------+---------------+---------+-----------+----------+--------------+ PERO     Full                                                        +---------+---------------+---------+-----------+----------+--------------+   +---------+---------------+---------+-----------+----------+--------------+ LEFT     CompressibilityPhasicitySpontaneityPropertiesThrombus Aging +---------+---------------+---------+-----------+----------+--------------+ CFV      Full           Yes      Yes                                 +---------+---------------+---------+-----------+----------+--------------+  SFJ      Full                                                        +---------+---------------+---------+-----------+----------+--------------+ FV Prox  Full                                                        +---------+---------------+---------+-----------+----------+--------------+ FV Mid   Full                                                        +---------+---------------+---------+-----------+----------+--------------+ FV DistalFull                                                         +---------+---------------+---------+-----------+----------+--------------+ PFV      Full                                                        +---------+---------------+---------+-----------+----------+--------------+ POP                                                   Not visualized +---------+---------------+---------+-----------+----------+--------------+ PTV      Full                                                        +---------+---------------+---------+-----------+----------+--------------+ PERO     Full                                                        +---------+---------------+---------+-----------+----------+--------------+     Summary: RIGHT: - There is no evidence of deep vein thrombosis in the lower extremity. However, portions of this examination were limited- see technologist comments above.  - No cystic structure found in the popliteal fossa.  LEFT: - There is no evidence of deep vein thrombosis in the lower extremity. However, portions of this examination were limited- see technologist comments above.  - No cystic structure found in the popliteal fossa.  *See table(s) above for measurements and observations. Electronically signed by Heath Lark on 10/06/2022 at 5:41:29 PM.    Final    DG Chest Port 1 View  Result Date: 10/06/2022 CLINICAL DATA:  Acute respiratory  distress. EXAM: PORTABLE CHEST 1 VIEW COMPARISON:  Chest x-ray May 28 214. FINDINGS: In comparison to May 27, 24 chest x-ray, increased bilateral interstitial opacities and cavitary consolidation in the right lung apex. Findings better characterized on recent CT of the chest Oct 04, 2022. cardiomediastinal silhouette is unchanged. Similar position of right IJ Port-A-Cath. Bilateral shoulder degenerative change. Median sternotomy. Cardiac valve replacement. IMPRESSION: In comparison to May 27, 24 chest x-ray, increased bilateral interstitial opacities and cavitary consolidation in the right lung  apex. Findings better characterized on recent CT of the chest Oct 04, 2022. This could represent worsening infection and/or edema. Electronically Signed   By: Feliberto Harts M.D.   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 RADIATION DOSE REDUCTION: This exam was performed according to the departmental dose-optimization program which includes automated exposure control, adjustment of the mA and/or kV according to patient size and/or use of iterative reconstruction technique. CONTRAST:  75mL OMNIPAQUE IOHEXOL 300 MG/ML  SOLN COMPARISON:  Chest radiograph dated 10/03/2022, CT chest dated 08/29/2022, MR abdomen dated 10/09/2020 FINDINGS: Cardiovascular: Right chest wall port terminates at the superior cavoatrial junction. Prior aortic valve replacement. Normal heart size. No significant pericardial fluid/thickening. Great vessels are normal in course and caliber. No central pulmonary emboli. Coronary artery calcifications and aortic atherosclerosis. Mediastinum/Nodes: Imaged thyroid gland without nodules meeting criteria for imaging follow-up by size. Normal esophagus. No pathologically enlarged axillary, supraclavicular, mediastinal, or hilar lymph nodes. Previously noted precarinal lymph node has decreased in size, measuring 7 mm (2:65), previously 11 mm, and subcarinal lymph node measures 9 mm (2:72), previously 13 mm. Lungs/Pleura: The central airways are patent. Upper lobe predominant centrilobular and paraseptal emphysema. Thick-walled cavitary lesion along the medial suprahilar right upper lobe is decreased in size, measuring 4.4 x 3.6 cm, previously 4.6 x 3.9 cm  with interval increased conspicuity of a peripheral nodular component along the posteromedial aspect measuring 1.3 x 0.8 cm (6:38). There is increased adjacent right apical consolidation and interstitial thickening. Previously noted right lower lobe consolidation has largely resolved with a small amount of residual consolidation along the superior segment right lower lobe. Dependent ground-glass density in the right lower lobe is also seen. Left upper lobe peribronchovascular consolidation has largely resolved. No pneumothorax. Interval resolution of right pleural effusion. No pleural effusion. Upper abdomen: Partially imaged bilateral simple renal cysts. No specific follow-up imaging recommended. Subcentimeter hypoattenuating focus within the partially imaged inferior right hepatic lobe (7:55) correspond to suspected prior ablation site noted on MRI. Musculoskeletal: No acute or abnormal lytic or blastic osseous lesions. Median sternotomy wires are nondisplaced. IMPRESSION: 1. Decreased size of thick-walled cavitary lesion along the medial suprahilar right upper lobe with interval increased conspicuity of a peripheral nodular component. Increased adjacent right apical consolidation and interstitial thickening, likely infectious/inflammatory. Attention on follow-up. 2. Previously noted right lower lobe consolidation has largely resolved with a small amount of residual consolidation along the superior segment right lower lobe. Dependent ground-glass density in the right lower lobe is also seen. Left upper lobe peribronchovascular consolidation has largely resolved. 3. Previously noted precarinal and subcarinal lymph nodes have decreased in size, likely reactive. 4. Aortic Atherosclerosis (ICD10-I70.0) and Emphysema (ICD10-J43.9). Coronary  artery calcifications. Assessment for potential risk factor modification, dietary therapy or pharmacologic therapy may be warranted, if clinically indicated. Electronically Signed    By: Agustin Cree M.D.   On: 10/04/2022 08:54   DG Chest Port 1 View  Result Date: 10/03/2022 CLINICAL DATA:  Sepsis. Weakness. Cough. EXAM: PORTABLE CHEST 1 VIEW COMPARISON:  Chest CT 08/29/2022 FINDINGS: Right chest port remains in place. Chronic right lung volume loss with right upper lobe pleuroparenchymal opacity. The cavitary lesion on CT is not well-defined by radiograph. There is improving right infrahilar opacity from prior CT. The previous left apical opacity is not definitively seen by radiograph. Prior median sternotomy with prosthetic aortic valve. The heart is normal in size. No significant pleural effusion or pneumothorax. IMPRESSION: 1. Chronic right lung volume loss with right upper lobe pleuroparenchymal opacity. The cavitary lesion on CT is not well-defined by radiograph. 2. Improving right infrahilar opacity from prior CT. 3. Previous left apical opacity is not definitively seen by radiograph. Electronically Signed   By: Narda Rutherford M.D.   On: 10/03/2022 16:28   DG ESOPHAGUS W DOUBLE CM (HD)  Result Date: 09/14/2022 CLINICAL DATA:  Provided history: Esophageal obstruction. Additional history provided: the patient reports difficulty swallowing, feeling as though foods are becoming stuck at the level of the throat. EXAM: ESOPHAGUS/BARIUM SWALLOW/TABLET STUDY TECHNIQUE: A combined double and single contrast examination was performed using effervescent crystals, high-density barium, and thin liquid barium. The exam was performed by Rockie Neighbours, PA-C, and was supervised and interpreted by Dr. Jackey Loge. FLUOROSCOPY: Fluoroscopy time: 1 minute 36 seconds (6.2 mGy). COMPARISON:  Modified barium swallow 08/31/2022. FINDINGS: Small-volume aspiration of thin barium contrast noted at the end of today's examination (with contrast extending to the level of the upper trachea). Mild-to-moderately narrowed appearance of the upper thoracic esophagus. A swallowed 13 mm barium tablet did not pass beyond  this point on the recent prior modified barium swallow of 08/31/2022, and these findings are suspicious for the presence of a stricture at this site. Due to aspiration of the liquid barium contrast, a barium tablet was not administered today. Esophagus normal in caliber and smooth in contour elsewhere. Moderate intermittent esophageal dysmotility with tertiary contractions. No appreciable hiatal hernia. No gastroesophageal reflux was observed. Prior median sternotomy and aortic valve replacement. Impressions #1 and #2 will be called to the ordering clinician or representative by the Radiologist Assistant, and communication documented in the PACS or [employer]. IMPRESSION: 1. Small-volume aspiration of thin barium contrast noted at the end of today's examination (with contrast extending to the level of the upper trachea). 2. Mild-to-moderately narrowed appearance of the upper thoracic esophagus. A swallowed 13 mm barium tablet did not pass beyond this point on the recent prior modified barium swallow 08/31/2022, an findings are suspicious for the presence of a stricture at this site. Endoscopy should be considered for further evaluation. Due to aspiration of liquid barium contrast, a barium tablet was not administered today. 3. Moderate esophageal dysmotility with tertiary contractions. Electronically Signed   By: Jackey Loge D.O.   On: 09/14/2022 12:09   (Echo, Carotid, EGD, Colonoscopy, ERCP)    Subjective: No complains  Discharge Exam: Vitals:   10/08/22 1120 10/08/22 1128  BP:    Pulse: 92   Resp: 20   Temp:  98.1 F (36.7 C)  SpO2: (!) 88%    Vitals:   10/08/22 1040 10/08/22 1100 10/08/22 1120 10/08/22 1128  BP: 134/78     Pulse: 84 79 92  Resp: (!) 23 (!) 23 20   Temp:    98.1 F (36.7 C)  TempSrc:    Oral  SpO2: 94% 99% (!) 88%   Weight:      Height:        General: Pt is alert, awake, not in acute distress Cardiovascular: RRR, S1/S2 +, no rubs, no gallops Respiratory:  CTA bilaterally, no wheezing, no rhonchi Abdominal: Soft, NT, ND, bowel sounds + Extremities: no edema, no cyanosis    The results of significant diagnostics from this hospitalization (including imaging, microbiology, ancillary and laboratory) are listed below for reference.     Microbiology: Recent Results (from the past 240 hour(s))  Culture, blood (Routine x 2)     Status: Abnormal   Collection Time: 10/03/22  3:35 PM   Specimen: BLOOD  Result Value Ref Range Status   Specimen Description   Final    BLOOD LEFT ANTECUBITAL Performed at [facility], [address] [place], [place] [address]    Special Requests   Final    BOTTLES DRAWN AEROBIC AND ANAEROBIC Blood Culture results may not be optimal due to an inadequate volume of blood received in culture bottles Performed at [facility], [address], [place], [place] [address]    Culture  Setup Time   Final    GRAM POSITIVE COCCI AEROBIC BOTTLE ONLY CRITICAL RESULT CALLED TO, READ BACK BY AND VERIFIED WITH: T GREEN,PHARMD@[id number]  10/05/22 MK    Culture (A)  Final    STAPHYLOCOCCUS EPIDERMIDIS THE SIGNIFICANCE OF ISOLATING THIS ORGANISM FROM A SINGLE SET OF BLOOD CULTURES WHEN MULTIPLE SETS ARE DRAWN IS UNCERTAIN. PLEASE NOTIFY THE MICROBIOLOGY DEPARTMENT WITHIN ONE WEEK IF SPECIATION AND SENSITIVITIES ARE REQUIRED. Performed at [facility], [address] [place], [place] [address]    Report Status 10/07/2022 FINAL  Final  Blood Culture ID Panel (Reflexed)     Status: Abnormal   Collection Time: 10/03/22  3:35 PM  Result Value Ref Range Status   Enterococcus faecalis NOT DETECTED NOT DETECTED Final   Enterococcus Faecium NOT DETECTED NOT DETECTED Final   Listeria monocytogenes NOT DETECTED NOT DETECTED Final   Staphylococcus species DETECTED (A) NOT DETECTED Final    Comment: CRITICAL RESULT CALLED TO, READ BACK BY AND VERIFIED WITH: T GREEN,PHARMD@[id number]  10/05/22 MK     Staphylococcus aureus (BCID) NOT DETECTED NOT DETECTED Final   Staphylococcus epidermidis DETECTED (A) NOT DETECTED Final    Comment: Methicillin (oxacillin) resistant coagulase negative staphylococcus. Possible blood culture contaminant (unless isolated from more than one blood culture draw or clinical case suggests pathogenicity). No antibiotic treatment is indicated for blood  culture contaminants. CRITICAL RESULT CALLED TO, READ BACK BY AND VERIFIED WITH: T GREEN,PHARMD@[id number]  10/05/22    Staphylococcus lugdunensis NOT DETECTED NOT DETECTED Final   Streptococcus species NOT DETECTED NOT DETECTED Final   Streptococcus agalactiae NOT DETECTED NOT DETECTED Final   Streptococcus pneumoniae NOT DETECTED NOT DETECTED Final   Streptococcus pyogenes NOT DETECTED NOT DETECTED Final   A.calcoaceticus-baumannii NOT DETECTED NOT DETECTED Final   Bacteroides fragilis NOT DETECTED NOT DETECTED Final   Enterobacterales NOT DETECTED NOT DETECTED Final   Enterobacter cloacae complex NOT DETECTED NOT DETECTED Final   Escherichia coli NOT DETECTED NOT DETECTED Final   Klebsiella aerogenes NOT DETECTED NOT DETECTED Final   Klebsiella oxytoca NOT DETECTED NOT DETECTED Final   Klebsiella pneumoniae NOT DETECTED NOT DETECTED Final   Proteus species NOT DETECTED NOT DETECTED Final   Salmonella  species NOT DETECTED NOT DETECTED Final   Serratia marcescens NOT DETECTED NOT DETECTED Final   Haemophilus influenzae NOT DETECTED NOT DETECTED Final   Neisseria meningitidis NOT DETECTED NOT DETECTED Final   Pseudomonas aeruginosa NOT DETECTED NOT DETECTED Final   Stenotrophomonas maltophilia NOT DETECTED NOT DETECTED Final   Candida albicans NOT DETECTED NOT DETECTED Final   Candida auris NOT DETECTED NOT DETECTED Final   Candida glabrata NOT DETECTED NOT DETECTED Final   Candida krusei NOT DETECTED NOT DETECTED Final   Candida parapsilosis NOT DETECTED NOT DETECTED Final   Candida tropicalis NOT DETECTED NOT  DETECTED Final   Cryptococcus neoformans/gattii NOT DETECTED NOT DETECTED Final   Methicillin resistance mecA/C DETECTED (A) NOT DETECTED Final    Comment: CRITICAL RESULT CALLED TO, READ BACK BY AND VERIFIED WITH: T GREEN,PHARMD@[id number]  10/05/22 MK Performed at [facility], [address] [place], [place] [address]   Culture, blood (Routine x 2)     Status: None   Collection Time: 10/03/22  3:41 PM   Specimen: BLOOD  Result Value Ref Range Status   Specimen Description   Final    BLOOD RIGHT ANTECUBITAL Performed at [facility], [address], [place], [place] [address]    Special Requests   Final    BOTTLES DRAWN AEROBIC AND ANAEROBIC Blood Culture adequate volume Performed at [facility], [address], [place], [place] [address]    Culture   Final    NO GROWTH 5 DAYS Performed at [facility], [address], [place], [place] [address]    Report Status 10/08/2022 FINAL  Final  Urine Culture     Status: Abnormal   Collection Time: 10/03/22  3:48 PM   Specimen: Urine, Random  Result Value Ref Range Status   Specimen Description   Final    URINE, RANDOM Performed at [facility], [address], [place], [place] [address]    Special Requests   Final    NONE Reflexed from [id number] Performed at [facility], [address] [place], [place] [address]    Culture MULTIPLE SPECIES PRESENT, SUGGEST RECOLLECTION (A)  Final   Report Status 10/05/2022 FINAL  Final  Resp panel by RT-PCR (RSV, Flu A&B, Covid) Anterior Nasal Swab     Status: None   Collection Time: 10/03/22  3:57 PM   Specimen: Anterior Nasal Swab  Result Value Ref Range Status   SARS Coronavirus 2 by RT PCR NEGATIVE NEGATIVE Final    Comment: (NOTE) SARS-CoV-2 target nucleic acids are NOT DETECTED.  The SARS-CoV-2 RNA is generally detectable in upper respiratory specimens during the acute phase of infection. The  lowest concentration of SARS-CoV-2 viral copies this assay can detect is 138 copies/mL. A negative result does not preclude SARS-Cov-2 infection and should not be used as the sole basis for treatment or other patient management decisions. A negative result may occur with  improper specimen collection/handling, submission of specimen other than nasopharyngeal swab, presence of viral mutation(s) within the areas targeted by this assay, and inadequate number of viral copies(<138 copies/mL). A negative result must be combined with clinical observations, patient history, and epidemiological information. The expected result is Negative.  Fact Sheet for Patients:  BloggerCourse.com  Fact Sheet for Healthcare Providers:  SeriousBroker.it  This test is no t yet approved or cleared by the [place] FDA and  has been authorized for detection and/or diagnosis of SARS-CoV-2 by FDA under an Emergency  Use Authorization (EUA). This EUA will remain  in effect (meaning this test can be used) for the duration of the COVID-19 declaration under Section 564(b)(1) of the Act, 21 U.S.C.section 360bbb-3(b)(1), unless the authorization is terminated  or revoked sooner.       Influenza A by PCR NEGATIVE NEGATIVE Final   Influenza B by PCR NEGATIVE NEGATIVE Final    Comment: (NOTE) The Xpert Xpress SARS-CoV-2/FLU/RSV plus assay is intended as an aid in the diagnosis of influenza from Nasopharyngeal swab specimens and should not be used as a sole basis for treatment. Nasal washings and aspirates are unacceptable for Xpert Xpress SARS-CoV-2/FLU/RSV testing.  Fact Sheet for Patients: BloggerCourse.com  Fact Sheet for Healthcare Providers: SeriousBroker.it  This test is not yet approved or cleared by the [place] FDA and has been authorized for detection and/or diagnosis of SARS-CoV-2 by FDA under  an Emergency Use Authorization (EUA). This EUA will remain in effect (meaning this test can be used) for the duration of the COVID-19 declaration under Section 564(b)(1) of the Act, 21 U.S.C. section 360bbb-3(b)(1), unless the authorization is terminated or revoked.     Resp Syncytial Virus by PCR NEGATIVE NEGATIVE Final    Comment: (NOTE) Fact Sheet for Patients: BloggerCourse.com  Fact Sheet for Healthcare Providers: SeriousBroker.it  This test is not yet approved or cleared by the [place] FDA and has been authorized for detection and/or diagnosis of SARS-CoV-2 by FDA under an Emergency Use Authorization (EUA). This EUA will remain in effect (meaning this test can be used) for the duration of the COVID-19 declaration under Section 564(b)(1) of the Act, 21 U.S.C. section 360bbb-3(b)(1), unless the authorization is terminated or revoked.  Performed at [facility], [address], [place], [place] [address]   MRSA Next Gen by PCR, Nasal     Status: None   Collection Time: 10/03/22 11:18 PM   Specimen: Nasopharyngeal Swab; Nasal Swab  Result Value Ref Range Status   MRSA by PCR Next Gen NOT DETECTED NOT DETECTED Final    Comment: (NOTE) The GeneXpert MRSA Assay (FDA approved for NASAL specimens only), is one component of a comprehensive MRSA colonization surveillance program. It is not intended to diagnose MRSA infection nor to guide or monitor treatment for MRSA infections. Test performance is not FDA approved in patients less than 16 years old. Performed at [facility], [address], [place], [place] [address]   Respiratory (~20 pathogens) panel by PCR     Status: None   Collection Time: 10/03/22 11:43 PM   Specimen: Nasopharyngeal Swab; Respiratory  Result Value Ref Range Status   Adenovirus NOT DETECTED NOT DETECTED Final   Coronavirus 229E NOT DETECTED NOT DETECTED Final     Comment: (NOTE) The Coronavirus on the Respiratory Panel, DOES NOT test for the novel  Coronavirus (2019 nCoV)    Coronavirus HKU1 NOT DETECTED NOT DETECTED Final   Coronavirus NL63 NOT DETECTED NOT DETECTED Final   Coronavirus OC43 NOT DETECTED NOT DETECTED Final   Metapneumovirus NOT DETECTED NOT DETECTED Final   Rhinovirus / Enterovirus NOT DETECTED NOT DETECTED Final   Influenza A NOT DETECTED NOT DETECTED Final   Influenza B NOT DETECTED NOT DETECTED Final   Parainfluenza Virus 1 NOT DETECTED NOT DETECTED Final   Parainfluenza Virus 2 NOT DETECTED NOT DETECTED Final   Parainfluenza Virus 3 NOT DETECTED NOT DETECTED Final   Parainfluenza Virus 4 NOT DETECTED NOT DETECTED Final   Respiratory Syncytial Virus NOT DETECTED  NOT DETECTED Final   Bordetella pertussis NOT DETECTED NOT DETECTED Final   Bordetella Parapertussis NOT DETECTED NOT DETECTED Final   Chlamydophila pneumoniae NOT DETECTED NOT DETECTED Final   Mycoplasma pneumoniae NOT DETECTED NOT DETECTED Final    Comment: Performed at [facility], [address] [place] [address], [place], [place] [address]     Labs: BNP (last 3 results) Recent Labs    10/03/22 1604 10/06/22 0800  BNP 152.0* 297.1*   Basic Metabolic Panel: Recent Labs  Lab 10/03/22 1543 10/04/22 0500 10/05/22 0500 10/05/22 1750 10/06/22 0618 10/06/22 0806 10/06/22 1839 10/07/22 0540  NA 145 146* 146*  --  142  --   --  141  K 3.9 2.9* 3.1*  --  2.9*  --  4.2 3.3*  CL 108 111 111  --  109  --   --  108  CO2 25 29 28   --  26  --   --  26  GLUCOSE 599* 74 123* 444* 160*  --   --  183*  BUN 35* 23 24*  --  22  --   --  27*  CREATININE 1.22 0.79 0.83  --  0.93  --   --  1.15  CALCIUM 9.3 9.1 8.8*  --  8.2*  --   --  8.6*  MG  --  1.8 1.8  --   --  1.6* 2.9*  --   PHOS  --   --  2.7  --   --   --   --   --    Liver Function Tests: Recent Labs  Lab 10/03/22 1543 10/05/22 0500  AST 16 21  ALT 14 14  ALKPHOS 70 48  BILITOT 0.7 0.5  PROT 7.6 6.6   ALBUMIN 2.4* 1.9*   No results for input(s): "LIPASE", "AMYLASE" in the last 168 hours. No results for input(s): "AMMONIA" in the last 168 hours. CBC: Recent Labs  Lab 10/03/22 1543 10/04/22 0500 10/05/22 0500 10/06/22 0618 10/07/22 0540  WBC 11.0* 10.8* 8.2 6.5 9.8  NEUTROABS 10.0*  --   --   --   --   HGB 10.1* 10.2* 9.2* 8.7* 9.3*  HCT 33.0* 32.6* 30.5* 29.3* 30.1*  MCV 89.7 88.6 89.4 89.6 89.1  PLT 265 252 223 170 204   Cardiac Enzymes: No results for input(s): "CKTOTAL", "CKMB", "CKMBINDEX", "TROPONINI" in the last 168 hours. BNP: Invalid input(s): "POCBNP" CBG: Recent Labs  Lab 10/06/22 2211 10/06/22 2331 10/07/22 0403 10/07/22 0730 10/07/22 1207  GLUCAP 244* 263* 183* 142* 296*   D-Dimer No results for input(s): "DDIMER" in the last 72 hours. Hgb A1c No results for input(s): "HGBA1C" in the last 72 hours. Lipid Profile No results for input(s): "CHOL", "HDL", "LDLCALC", "TRIG", "CHOLHDL", "LDLDIRECT" in the last 72 hours. Thyroid function studies No results for input(s): "TSH", "T4TOTAL", "T3FREE", "THYROIDAB" in the last 72 hours.  Invalid input(s): "FREET3" Anemia work up No results for input(s): "VITAMINB12", "FOLATE", "FERRITIN", "TIBC", "IRON", "RETICCTPCT" in the last 72 hours. Urinalysis    Component Value Date/Time   COLORURINE YELLOW 10/03/2022 1548   APPEARANCEUR CLEAR 10/03/2022 1548   LABSPEC 1.028 10/03/2022 1548   PHURINE 5.0 10/03/2022 1548   GLUCOSEU >=500 (A) 10/03/2022 1548   HGBUR SMALL (A) 10/03/2022 1548   BILIRUBINUR NEGATIVE 10/03/2022 1548   KETONESUR NEGATIVE 10/03/2022 1548   PROTEINUR 30 (A) 10/03/2022 1548   UROBILINOGEN 0.2 11/14/2013 1305   NITRITE NEGATIVE 10/03/2022 1548   LEUKOCYTESUR SMALL (A) 10/03/2022  1548   Sepsis Labs Recent Labs  Lab 10/04/22 0500 10/05/22 0500 10/06/22 0618 10/07/22 0540  WBC 10.8* 8.2 6.5 9.8   Microbiology Recent Results (from the past 240 hour(s))  Culture, blood (Routine x 2)      Status: Abnormal   Collection Time: 10/03/22  3:35 PM   Specimen: BLOOD  Result Value Ref Range Status   Specimen Description   Final    BLOOD LEFT ANTECUBITAL Performed at [facility], [address], [place], [place] [address]    Special Requests   Final    BOTTLES DRAWN AEROBIC AND ANAEROBIC Blood Culture results may not be optimal due to an inadequate volume of blood received in culture bottles Performed at [facility], [address] [place], [place] [address]    Culture  Setup Time   Final    GRAM POSITIVE COCCI AEROBIC BOTTLE ONLY CRITICAL RESULT CALLED TO, READ BACK BY AND VERIFIED WITH: T GREEN,PHARMD@[id number]  10/05/22 MK    Culture (A)  Final    STAPHYLOCOCCUS EPIDERMIDIS THE SIGNIFICANCE OF ISOLATING THIS ORGANISM FROM A SINGLE SET OF BLOOD CULTURES WHEN MULTIPLE SETS ARE DRAWN IS UNCERTAIN. PLEASE NOTIFY THE MICROBIOLOGY DEPARTMENT WITHIN ONE WEEK IF SPECIATION AND SENSITIVITIES ARE REQUIRED. Performed at [facility], [address], [place], [place] [address]    Report Status 10/07/2022 FINAL  Final  Blood Culture ID Panel (Reflexed)     Status: Abnormal   Collection Time: 10/03/22  3:35 PM  Result Value Ref Range Status   Enterococcus faecalis NOT DETECTED NOT DETECTED Final   Enterococcus Faecium NOT DETECTED NOT DETECTED Final   Listeria monocytogenes NOT DETECTED NOT DETECTED Final   Staphylococcus species DETECTED (A) NOT DETECTED Final    Comment: CRITICAL RESULT CALLED TO, READ BACK BY AND VERIFIED WITH: T GREEN,PHARMD@[id number]  10/05/22 MK    Staphylococcus aureus (BCID) NOT DETECTED NOT DETECTED Final   Staphylococcus epidermidis DETECTED (A) NOT DETECTED Final    Comment: Methicillin (oxacillin) resistant coagulase negative staphylococcus. Possible blood culture contaminant (unless isolated from more than one blood culture draw or clinical case suggests pathogenicity). No antibiotic treatment is indicated for blood   culture contaminants. CRITICAL RESULT CALLED TO, READ BACK BY AND VERIFIED WITH: T GREEN,PHARMD@0655  10/05/22    Staphylococcus lugdunensis NOT DETECTED NOT DETECTED Final   Streptococcus species NOT DETECTED NOT DETECTED Final   Streptococcus agalactiae NOT DETECTED NOT DETECTED Final   Streptococcus pneumoniae NOT DETECTED NOT DETECTED Final   Streptococcus pyogenes NOT DETECTED NOT DETECTED Final   A.calcoaceticus-baumannii NOT DETECTED NOT DETECTED Final   Bacteroides fragilis NOT DETECTED NOT DETECTED Final   Enterobacterales NOT DETECTED NOT DETECTED Final   Enterobacter cloacae complex NOT DETECTED NOT DETECTED Final   Escherichia coli NOT DETECTED NOT DETECTED Final   Klebsiella aerogenes NOT DETECTED NOT DETECTED Final   Klebsiella oxytoca NOT DETECTED NOT DETECTED Final   Klebsiella pneumoniae NOT DETECTED NOT DETECTED Final   Proteus species NOT DETECTED NOT DETECTED Final   Salmonella species NOT DETECTED NOT DETECTED Final   Serratia marcescens NOT DETECTED NOT DETECTED Final   Haemophilus influenzae NOT DETECTED NOT DETECTED Final   Neisseria meningitidis NOT DETECTED NOT DETECTED Final   Pseudomonas aeruginosa NOT DETECTED NOT DETECTED Final   Stenotrophomonas maltophilia NOT DETECTED NOT DETECTED Final   Candida albicans NOT DETECTED NOT DETECTED Final   Candida auris NOT DETECTED NOT DETECTED Final   Candida glabrata NOT DETECTED NOT DETECTED Final   Candida  krusei NOT DETECTED NOT DETECTED Final   Candida parapsilosis NOT DETECTED NOT DETECTED Final   Candida tropicalis NOT DETECTED NOT DETECTED Final   Cryptococcus neoformans/gattii NOT DETECTED NOT DETECTED Final   Methicillin resistance mecA/C DETECTED (A) NOT DETECTED Final    Comment: CRITICAL RESULT CALLED TO, READ BACK BY AND VERIFIED WITH: T GREEN,PHARMD@0655  10/05/22 MK Performed at [facility], [address] [place], [place] [address]   Culture, blood (Routine x 2)     Status: None    Collection Time: 10/03/22  3:41 PM   Specimen: BLOOD  Result Value Ref Range Status   Specimen Description   Final    BLOOD RIGHT ANTECUBITAL Performed at [facility], [address], [place], [place] [address]    Special Requests   Final    BOTTLES DRAWN AEROBIC AND ANAEROBIC Blood Culture adequate volume Performed at [facility], [address], [place], [place] [address]    Culture   Final    NO GROWTH 5 DAYS Performed at [facility], [address] [place], [place] [address]    Report Status 10/08/2022 FINAL  Final  Urine Culture     Status: Abnormal   Collection Time: 10/03/22  3:48 PM   Specimen: Urine, Random  Result Value Ref Range Status   Specimen Description   Final    URINE, RANDOM Performed at [facility], [address], [place], [place] [address]    Special Requests   Final    NONE Reflexed from [id number] Performed at [facility], [address], [place], [place] [address]    Culture MULTIPLE SPECIES PRESENT, SUGGEST RECOLLECTION (A)  Final   Report Status 10/05/2022 FINAL  Final  Resp panel by RT-PCR (RSV, Flu A&B, Covid) Anterior Nasal Swab     Status: None   Collection Time: 10/03/22  3:57 PM   Specimen: Anterior Nasal Swab  Result Value Ref Range Status   SARS Coronavirus 2 by RT PCR NEGATIVE NEGATIVE Final    Comment: (NOTE) SARS-CoV-2 target nucleic acids are NOT DETECTED.  The SARS-CoV-2 RNA is generally detectable in upper respiratory specimens during the acute phase of infection. The lowest concentration of SARS-CoV-2 viral copies this assay can detect is 138 copies/mL. A negative result does not preclude SARS-Cov-2 infection and should not be used as the sole basis for treatment or other patient management decisions. A negative result may occur with  improper specimen collection/handling, submission of specimen other than nasopharyngeal swab, presence of viral  mutation(s) within the areas targeted by this assay, and inadequate number of viral copies(<138 copies/mL). A negative result must be combined with clinical observations, patient history, and epidemiological information. The expected result is Negative.  Fact Sheet for Patients:  BloggerCourse.com  Fact Sheet for Healthcare Providers:  SeriousBroker.it  This test is no t yet approved or cleared by the [place] FDA and  has been authorized for detection and/or diagnosis of SARS-CoV-2 by FDA under an Emergency Use Authorization (EUA). This EUA will remain  in effect (meaning this test can be used) for the duration of the COVID-19 declaration under Section 564(b)(1) of the Act, 21 U.S.C.section 360bbb-3(b)(1), unless the authorization is terminated  or revoked sooner.       Influenza A by PCR NEGATIVE NEGATIVE Final   Influenza B by PCR NEGATIVE NEGATIVE Final    Comment: (NOTE) The Xpert Xpress SARS-CoV-2/FLU/RSV plus assay is intended as an aid in the  diagnosis of influenza from Nasopharyngeal swab specimens and should not be used as a sole basis for treatment. Nasal washings and aspirates are unacceptable for Xpert Xpress SARS-CoV-2/FLU/RSV testing.  Fact Sheet for Patients: BloggerCourse.com  Fact Sheet for Healthcare Providers: SeriousBroker.it  This test is not yet approved or cleared by the [place] FDA and has been authorized for detection and/or diagnosis of SARS-CoV-2 by FDA under an Emergency Use Authorization (EUA). This EUA will remain in effect (meaning this test can be used) for the duration of the COVID-19 declaration under Section 564(b)(1) of the Act, 21 U.S.C. section 360bbb-3(b)(1), unless the authorization is terminated or revoked.     Resp Syncytial Virus by PCR NEGATIVE NEGATIVE Final    Comment: (NOTE) Fact Sheet for  Patients: BloggerCourse.com  Fact Sheet for Healthcare Providers: SeriousBroker.it  This test is not yet approved or cleared by the [place] FDA and has been authorized for detection and/or diagnosis of SARS-CoV-2 by FDA under an Emergency Use Authorization (EUA). This EUA will remain in effect (meaning this test can be used) for the duration of the COVID-19 declaration under Section 564(b)(1) of the Act, 21 U.S.C. section 360bbb-3(b)(1), unless the authorization is terminated or revoked.  Performed at [facility], [address], [place], [place] [address]   MRSA Next Gen by PCR, Nasal     Status: None   Collection Time: 10/03/22 11:18 PM   Specimen: Nasopharyngeal Swab; Nasal Swab  Result Value Ref Range Status   MRSA by PCR Next Gen NOT DETECTED NOT DETECTED Final    Comment: (NOTE) The GeneXpert MRSA Assay (FDA approved for NASAL specimens only), is one component of a comprehensive MRSA colonization surveillance program. It is not intended to diagnose MRSA infection nor to guide or monitor treatment for MRSA infections. Test performance is not FDA approved in patients less than 7 years old. Performed at [facility], [address], [place], [place] [address]   Respiratory (~20 pathogens) panel by PCR     Status: None   Collection Time: 10/03/22 11:43 PM   Specimen: Nasopharyngeal Swab; Respiratory  Result Value Ref Range Status   Adenovirus NOT DETECTED NOT DETECTED Final   Coronavirus 229E NOT DETECTED NOT DETECTED Final    Comment: (NOTE) The Coronavirus on the Respiratory Panel, DOES NOT test for the novel  Coronavirus (2019 nCoV)    Coronavirus HKU1 NOT DETECTED NOT DETECTED Final   Coronavirus NL63 NOT DETECTED NOT DETECTED Final   Coronavirus OC43 NOT DETECTED NOT DETECTED Final   Metapneumovirus NOT DETECTED NOT DETECTED Final   Rhinovirus / Enterovirus NOT DETECTED  NOT DETECTED Final   Influenza A NOT DETECTED NOT DETECTED Final   Influenza B NOT DETECTED NOT DETECTED Final   Parainfluenza Virus 1 NOT DETECTED NOT DETECTED Final   Parainfluenza Virus 2 NOT DETECTED NOT DETECTED Final   Parainfluenza Virus 3 NOT DETECTED NOT DETECTED Final   Parainfluenza Virus 4 NOT DETECTED NOT DETECTED Final   Respiratory Syncytial Virus NOT DETECTED NOT DETECTED Final   Bordetella pertussis NOT DETECTED NOT DETECTED Final   Bordetella Parapertussis NOT DETECTED NOT DETECTED Final   Chlamydophila pneumoniae NOT DETECTED NOT DETECTED Final   Mycoplasma pneumoniae NOT DETECTED NOT DETECTED Final    Comment: Performed at [place] [facility], [address] [place], [place] [address]      SIGNED:   Marinda Elk, MD  [facility] 10/08/2022, 11:31 AM Pager   If 7PM-7AM, please contact  night-coverage [email or web address] Password [email or web address]

## 2022-10-08 NOTE — Progress Notes (Signed)
[facility]  Name: [name] MRN: [id number] DOB: May 13, 1940 Admit date: 10/03/2022 Consult date: 10/06/2022 Referring provider: Dr. Isidoro Hoffman, [facility] Chief complaint: short of breath  Summary: 82 yo male with COPD presented with fever (103.17F), chills, weakness and productive cough.  Started on therapy for pneumonia.  Developed worsening hypoxia on 5/30 and PCCM consulted.  He was found to have a Rt upper lobe necrotic mass in January 2024 from NSCLC (Squamous cell) treated with concurrent chemoradiation with carboplatin and paclitaxel.  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  He is followed by Dr. Everardo Hoffman in the pulmonary office.  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  Transitioned to nasal cannula this morning.  Didn't need Bipap overnight.  Vital signs: BP 100/61   Pulse 76   Temp (!) 97.5 F (36.4 C) (Axillary)   Resp (!) 21   Ht 5\' 11"  (1.803 m)   Wt 54.4 kg   SpO2 100%   BMI 16.74 kg/m   Physical exam:  General - alert Eyes - pupils reactive ENT - no sinus tenderness, no stridor Cardiac - regular rate/rhythm, no murmur Chest - scattered rhonchi Abdomen - soft, non tender, + bowel sounds Extremities - 1+  edema Skin - no rashes Neuro - normal strength, moves extremities, follows commands Psych - normal mood and behavior  Labs:    Latest Ref Rng & Units 10/07/2022    5:40 AM 10/06/2022    6:39 PM 10/06/2022    6:18 AM  CMP  Glucose 70 - 99 mg/dL 782   956   BUN 8 - 23 mg/dL 27   22   Creatinine 2.13 - 1.24 mg/dL 0.86   5.78   Sodium 469 - 145 mmol/L 141   142   Potassium 3.5 - 5.1 mmol/L 3.3  4.2  2.9   Chloride 98 - 111 mmol/L 108   109   CO2 22 - 32 mmol/L 26   26   Calcium 8.9 - 10.3 mg/dL 8.6   8.2       Latest  Ref Rng & Units 10/07/2022    5:40 AM 10/06/2022    6:18 AM 10/05/2022    5:00 AM  CBC  WBC 4.0 - 10.5 K/uL 9.8  6.5  8.2   Hemoglobin 13.0 - 17.0 g/dL 9.3  8.7  9.2   Hematocrit 39.0 - 52.0 % 30.1  29.3  30.5   Platelets 150 - 400 K/uL 204  170  223    ABG    Component Value Date/Time   PHART 7.347 (L) 11/20/2013 0045   PCO2ART 43.4 11/20/2013 0045   PO2ART 100.0 11/20/2013 0045   HCO3 23.7 11/20/2013 0045   TCO2 23 11/20/2013 1808   ACIDBASEDEF 2.0 11/20/2013 0045   O2SAT 97.0 11/20/2013 0045    CBG (last 3)  Recent Labs    10/07/22 0403 10/07/22 0730 10/07/22 1207  GLUCAP 183* 142* 296*    Assessment/plan:  Acute on chronic hypoxic respiratory failure. - from pneumonia, COPD, and lung cancer with radiation pneumonitis - transitioned off Bipap - continue supplemental oxygen to keep SpO2 > 88% - continue brovana, pulmicort, yupelri >> can transition to advair 230-21 two puffs bid and spiriva respimat 2.5 mcg two puffs daily as outpt - prn albuterol - day 6 of zosyn -  transition to prednisone on 6/02; using steroids for radiation pneumonitis and COPD exacerbation  Esophageal stricture with aspiration. - seen by GI  Stage 3B NSCLC (Squamous cell). - followed by Dr. Arbutus Hoffman as outpt  Leg swelling. - doppler legs negative for DVT from 10/06/22  HTN. DM type 2 poorly controlled with steroid induced hyperglycemia. Severe protein calorie malnutrition. Anemia of chronic disease. Hypokalemia. Dysphagia with esophageal stricture. - per primary team  Goals of care. - palliative care consulted - DNR/DNI - transitioning to hospice care  If he remains in hospital, then PCCM will follow up on 10/10/22.  Otherwise he can follow up with Dr. Everardo Hoffman as an outpt.  Signature: Daniel Helling, MD [facility]Critical [facility] Pager - [phone] or [phone] 10/08/2022, 9:57 AM

## 2022-10-10 DIAGNOSIS — E119 Type 2 diabetes mellitus without complications: Secondary | ICD-10-CM | POA: Diagnosis not present

## 2022-10-10 DIAGNOSIS — C3491 Malignant neoplasm of unspecified part of right bronchus or lung: Secondary | ICD-10-CM | POA: Diagnosis not present

## 2022-10-10 DIAGNOSIS — R1310 Dysphagia, unspecified: Secondary | ICD-10-CM | POA: Diagnosis not present

## 2022-10-10 DIAGNOSIS — Z923 Personal history of irradiation: Secondary | ICD-10-CM | POA: Diagnosis not present

## 2022-10-10 DIAGNOSIS — I10 Essential (primary) hypertension: Secondary | ICD-10-CM | POA: Diagnosis not present

## 2022-10-10 DIAGNOSIS — J984 Other disorders of lung: Secondary | ICD-10-CM | POA: Diagnosis not present

## 2022-10-10 DIAGNOSIS — Z9221 Personal history of antineoplastic chemotherapy: Secondary | ICD-10-CM | POA: Diagnosis not present

## 2022-10-10 DIAGNOSIS — J449 Chronic obstructive pulmonary disease, unspecified: Secondary | ICD-10-CM | POA: Diagnosis not present

## 2022-10-11 DIAGNOSIS — I10 Essential (primary) hypertension: Secondary | ICD-10-CM | POA: Diagnosis not present

## 2022-10-11 DIAGNOSIS — J449 Chronic obstructive pulmonary disease, unspecified: Secondary | ICD-10-CM | POA: Diagnosis not present

## 2022-10-11 DIAGNOSIS — R1310 Dysphagia, unspecified: Secondary | ICD-10-CM | POA: Diagnosis not present

## 2022-10-11 DIAGNOSIS — J984 Other disorders of lung: Secondary | ICD-10-CM | POA: Diagnosis not present

## 2022-10-11 DIAGNOSIS — C3491 Malignant neoplasm of unspecified part of right bronchus or lung: Secondary | ICD-10-CM | POA: Diagnosis not present

## 2022-10-11 DIAGNOSIS — E119 Type 2 diabetes mellitus without complications: Secondary | ICD-10-CM | POA: Diagnosis not present

## 2022-10-12 DIAGNOSIS — R1310 Dysphagia, unspecified: Secondary | ICD-10-CM | POA: Diagnosis not present

## 2022-10-12 DIAGNOSIS — J984 Other disorders of lung: Secondary | ICD-10-CM | POA: Diagnosis not present

## 2022-10-12 DIAGNOSIS — C3491 Malignant neoplasm of unspecified part of right bronchus or lung: Secondary | ICD-10-CM | POA: Diagnosis not present

## 2022-10-12 DIAGNOSIS — I10 Essential (primary) hypertension: Secondary | ICD-10-CM | POA: Diagnosis not present

## 2022-10-12 DIAGNOSIS — E119 Type 2 diabetes mellitus without complications: Secondary | ICD-10-CM | POA: Diagnosis not present

## 2022-10-12 DIAGNOSIS — J449 Chronic obstructive pulmonary disease, unspecified: Secondary | ICD-10-CM | POA: Diagnosis not present

## 2022-10-13 DIAGNOSIS — J984 Other disorders of lung: Secondary | ICD-10-CM | POA: Diagnosis not present

## 2022-10-13 DIAGNOSIS — C3491 Malignant neoplasm of unspecified part of right bronchus or lung: Secondary | ICD-10-CM | POA: Diagnosis not present

## 2022-10-13 DIAGNOSIS — J449 Chronic obstructive pulmonary disease, unspecified: Secondary | ICD-10-CM | POA: Diagnosis not present

## 2022-10-13 DIAGNOSIS — E119 Type 2 diabetes mellitus without complications: Secondary | ICD-10-CM | POA: Diagnosis not present

## 2022-10-13 DIAGNOSIS — I10 Essential (primary) hypertension: Secondary | ICD-10-CM | POA: Diagnosis not present

## 2022-10-13 DIAGNOSIS — R1310 Dysphagia, unspecified: Secondary | ICD-10-CM | POA: Diagnosis not present

## 2022-11-07 DEATH — deceased

## 2022-11-14 ENCOUNTER — Ambulatory Visit: Payer: Self-pay | Admitting: Radiation Oncology

## 2022-11-14 ENCOUNTER — Ambulatory Visit: Payer: Medicare Other | Admitting: Cardiology

## 2022-11-17 ENCOUNTER — Ambulatory Visit: Payer: Medicare Other | Admitting: Radiation Oncology

## 2022-12-26 ENCOUNTER — Ambulatory Visit: Payer: Medicare Other | Admitting: Podiatry

## 2023-01-03 ENCOUNTER — Ambulatory Visit (HOSPITAL_BASED_OUTPATIENT_CLINIC_OR_DEPARTMENT_OTHER): Payer: Medicare Other | Admitting: Pulmonary Disease

## 2023-04-05 ENCOUNTER — Ambulatory Visit: Payer: Medicare Other | Admitting: Podiatry
